# Patient Record
Sex: Male | Born: 1965 | Race: White | Hispanic: No | Marital: Single | Smoking: Former smoker
Health system: Southern US, Community
[De-identification: ages and names within clinical notes are randomized; demographics above are authoritative.]

## PROBLEM LIST (undated history)

## (undated) DIAGNOSIS — F329 Major depressive disorder, single episode, unspecified: Secondary | ICD-10-CM

## (undated) DIAGNOSIS — F1910 Other psychoactive substance abuse, uncomplicated: Secondary | ICD-10-CM

## (undated) DIAGNOSIS — F419 Anxiety disorder, unspecified: Secondary | ICD-10-CM

## (undated) DIAGNOSIS — F32A Depression, unspecified: Secondary | ICD-10-CM

## (undated) DIAGNOSIS — E079 Disorder of thyroid, unspecified: Secondary | ICD-10-CM

## (undated) DIAGNOSIS — T1491XA Suicide attempt, initial encounter: Secondary | ICD-10-CM

## (undated) HISTORY — PX: OTHER SURGICAL HISTORY: SHX169

## (undated) HISTORY — DX: Disorder of thyroid, unspecified: E07.9

## (undated) HISTORY — DX: Major depressive disorder, single episode, unspecified: F32.9

## (undated) HISTORY — DX: Depression, unspecified: F32.A

## (undated) HISTORY — DX: Anxiety disorder, unspecified: F41.9

## (undated) HISTORY — DX: Other psychoactive substance abuse, uncomplicated: F19.10

---

## 1997-12-15 ENCOUNTER — Emergency Department (HOSPITAL_COMMUNITY): Admission: EM | Admit: 1997-12-15 | Discharge: 1997-12-15 | Payer: Self-pay | Admitting: Emergency Medicine

## 2000-08-29 ENCOUNTER — Other Ambulatory Visit (HOSPITAL_COMMUNITY): Admission: RE | Admit: 2000-08-29 | Discharge: 2000-09-29 | Payer: Self-pay | Admitting: Psychiatry

## 2000-09-05 ENCOUNTER — Encounter: Payer: Self-pay | Admitting: Emergency Medicine

## 2000-09-05 ENCOUNTER — Emergency Department (HOSPITAL_COMMUNITY): Admission: EM | Admit: 2000-09-05 | Discharge: 2000-09-05 | Payer: Self-pay | Admitting: Emergency Medicine

## 2002-02-11 ENCOUNTER — Encounter: Payer: Self-pay | Admitting: Emergency Medicine

## 2002-02-11 ENCOUNTER — Emergency Department (HOSPITAL_COMMUNITY): Admission: EM | Admit: 2002-02-11 | Discharge: 2002-02-11 | Payer: Self-pay | Admitting: Emergency Medicine

## 2002-05-30 ENCOUNTER — Emergency Department (HOSPITAL_COMMUNITY): Admission: EM | Admit: 2002-05-30 | Discharge: 2002-05-30 | Payer: Self-pay | Admitting: *Deleted

## 2006-05-05 ENCOUNTER — Emergency Department (HOSPITAL_COMMUNITY): Admission: EM | Admit: 2006-05-05 | Discharge: 2006-05-05 | Payer: Self-pay | Admitting: Emergency Medicine

## 2006-07-07 ENCOUNTER — Inpatient Hospital Stay (HOSPITAL_COMMUNITY): Admission: EM | Admit: 2006-07-07 | Discharge: 2006-07-11 | Payer: Self-pay | Admitting: Emergency Medicine

## 2006-08-29 ENCOUNTER — Emergency Department (HOSPITAL_COMMUNITY): Admission: EM | Admit: 2006-08-29 | Discharge: 2006-08-30 | Payer: Self-pay | Admitting: Emergency Medicine

## 2012-03-06 ENCOUNTER — Other Ambulatory Visit: Payer: Self-pay | Admitting: Physician Assistant

## 2012-03-06 ENCOUNTER — Ambulatory Visit
Admission: RE | Admit: 2012-03-06 | Discharge: 2012-03-06 | Disposition: A | Discharge status: Home / Self Care | Payer: 59 | Source: Ambulatory Visit | Attending: Physician Assistant | Admitting: Physician Assistant

## 2012-03-06 DIAGNOSIS — M545 Low back pain, unspecified: Secondary | ICD-10-CM

## 2014-01-10 ENCOUNTER — Ambulatory Visit: Payer: 59 | Admitting: Family Medicine

## 2014-01-10 VITALS — BP 100/72 | HR 66 | Temp 97.8°F | Resp 18 | Ht 76.0 in | Wt 224.0 lb

## 2014-01-10 DIAGNOSIS — H6120 Impacted cerumen, unspecified ear: Secondary | ICD-10-CM

## 2014-01-10 DIAGNOSIS — H9191 Unspecified hearing loss, right ear: Secondary | ICD-10-CM

## 2014-01-10 DIAGNOSIS — H9209 Otalgia, unspecified ear: Secondary | ICD-10-CM

## 2014-01-10 DIAGNOSIS — H9190 Unspecified hearing loss, unspecified ear: Secondary | ICD-10-CM

## 2014-01-10 MED ORDER — AMOXICILLIN 500 MG PO CAPS: 500.0000 mg | ORAL_CAPSULE | Freq: Three times a day (TID) | ORAL | Status: DC

## 2014-01-10 NOTE — Progress Notes (Signed)
Chief Complaint:  Chief Complaint  Patient presents with  . ears clogged    x1 mth has been using the drops otc     HPI: Christopher Giles is a 48 y.o. male who is here for  Acute on chronic Clogged ears and was recommended to get his ears cleaned.  He got a a smart phone and has been using earbuds for pandora and using it more so this may have quicken the onset of wax accumulation.  He had it done 1 year ago @ [facility].   Past Medical History  Diagnosis Date  . Anxiety   . Depression   . Substance abuse   . Thyroid disease    Past Surgical History  Procedure Laterality Date  . Punctured lung     History   Social History  . Marital Status: Single    Spouse Name: N/A    Number of Children: N/A  . Years of Education: N/A   Social History Main Topics  . Smoking status: Current Every Day Smoker -- 1.00 packs/day    Types: Cigarettes  . Smokeless tobacco: None  . Alcohol Use: No  . Drug Use: No  . Sexual Activity: None   Other Topics Concern  . None   Social History Narrative  . None   Family History  Problem Relation Age of Onset  . Hyperlipidemia Mother   . Cancer Mother   . Cancer Father   . Diabetes Maternal Grandmother    No Known Allergies Prior to Admission medications   Medication Sig Start Date End Date Taking? Authorizing Provider  clonazePAM (KLONOPIN) 1 MG tablet Take 1 mg by mouth 3 (three) times daily as needed for anxiety.   Yes Historical Provider, MD  levothyroxine (SYNTHROID, LEVOTHROID) 50 MCG tablet Take 50 mcg by mouth daily before breakfast.   Yes Historical Provider, MD  rosuvastatin (CRESTOR) 10 MG tablet Take 10 mg by mouth daily.   Yes Historical Provider, MD  sertraline (ZOLOFT) 100 MG tablet Take 100 mg by mouth daily. Takes one and half tablets a day   Yes Historical Provider, MD  silodosin (RAPAFLO) 8 MG CAPS capsule Take 8 mg by mouth daily with breakfast.   Yes Historical Provider, MD     ROS: The patient  denies fevers, chills, night sweats, unintentional weight loss, chest pain, palpitations, wheezing, dyspnea on exertion, nausea, vomiting, abdominal pain, dysuria, hematuria, melena, numbness, weakness, or tingling. + hearing loss  All other systems have been reviewed and were otherwise negative with the exception of those mentioned in the HPI and as above.    PHYSICAL EXAM: Filed Vitals:   01/10/14 2001  BP: 100/72  Pulse: 66  Temp: 97.8 F (36.6 C)  Resp: 18   Filed Vitals:   01/10/14 2001  Height: 6\' 4"  (1.93 m)  Weight: 224 lb (101.606 kg)   Body mass index is 27.28 kg/(m^2).  General: Alert, no acute distress HEENT:  Normocephalic, atraumatic, oropharynx patent. EOMI, PERRLA. Right ear  Cerumen impaction. Rechecked + erythematous external canal, I really can't see a TM, if so it is very retracted. Whisper test was normal after disimpaction Cardiovascular:  Regular rate and rhythm, no rubs murmurs or gallops.  No Carotid bruits, radial pulse intact. No pedal edema.  Respiratory: Clear to auscultation bilaterally.  No wheezes, rales, or rhonchi.  No cyanosis, no use of accessory musculature GI: No organomegaly, abdomen is soft and non-tender, positive bowel sounds.  No  masses. Skin: No rashes. Neurologic: Facial musculature symmetric. Psychiatric: Patient is appropriate throughout our interaction. Lymphatic: No cervical lymphadenopathy Musculoskeletal: Gait intact.   LABS: No results found for this or any previous visit.   EKG/XRAY:   Primary [facility]    ASSESSMENT/PLAN: Encounter Diagnoses  Name Primary?  . Cerumen impaction Yes  . Decreased hearing of right ear   . Otalgia    Right cerumen impaction sucessful He was also given a r for amoxacillin since he may have an infection after flusihing, ear canal is erythematous and TM may have been displaced/retracted.  I am unable to visualize it after flushing.  Whisper test was normal after  disimpaction F/u prn  Gross sideeffects, risk and benefits, and alternatives of medications d/w patient. Patient is aware that all medications have potential sideeffects and we are unable to predict every sideeffect or drug-drug interaction that may occur.  [name], DO 01/10/2014 8:51 PM

## 2016-05-31 ENCOUNTER — Emergency Department (HOSPITAL_COMMUNITY)
Admission: EM | Admit: 2016-05-31 | Discharge: 2016-06-01 | Disposition: A | Discharge status: Home / Self Care | Payer: Self-pay | Attending: Emergency Medicine | Admitting: Emergency Medicine

## 2016-05-31 ENCOUNTER — Emergency Department (HOSPITAL_COMMUNITY): Payer: Self-pay

## 2016-05-31 ENCOUNTER — Encounter (HOSPITAL_COMMUNITY): Payer: Self-pay | Admitting: *Deleted

## 2016-05-31 DIAGNOSIS — F418 Other specified anxiety disorders: Secondary | ICD-10-CM | POA: Insufficient documentation

## 2016-05-31 DIAGNOSIS — F419 Anxiety disorder, unspecified: Secondary | ICD-10-CM

## 2016-05-31 DIAGNOSIS — E039 Hypothyroidism, unspecified: Secondary | ICD-10-CM | POA: Insufficient documentation

## 2016-05-31 DIAGNOSIS — F17210 Nicotine dependence, cigarettes, uncomplicated: Secondary | ICD-10-CM | POA: Insufficient documentation

## 2016-05-31 DIAGNOSIS — R109 Unspecified abdominal pain: Secondary | ICD-10-CM

## 2016-05-31 DIAGNOSIS — R1084 Generalized abdominal pain: Secondary | ICD-10-CM | POA: Insufficient documentation

## 2016-05-31 DIAGNOSIS — F329 Major depressive disorder, single episode, unspecified: Secondary | ICD-10-CM

## 2016-05-31 LAB — CBC
HCT: 45.6 % (ref 39.0–52.0)
Hemoglobin: 15.8 g/dL (ref 13.0–17.0)
MCH: 31.6 pg (ref 26.0–34.0)
MCHC: 34.6 g/dL (ref 30.0–36.0)
MCV: 91.2 fL (ref 78.0–100.0)
PLATELETS: 236 10*3/uL (ref 150–400)
RBC: 5 MIL/uL (ref 4.22–5.81)
RDW: 12.4 % (ref 11.5–15.5)
WBC: 9.6 10*3/uL (ref 4.0–10.5)

## 2016-05-31 LAB — COMPREHENSIVE METABOLIC PANEL
ALBUMIN: 4.3 g/dL (ref 3.5–5.0)
ALT: 14 U/L — ABNORMAL LOW (ref 17–63)
AST: 18 U/L (ref 15–41)
Alkaline Phosphatase: 68 U/L (ref 38–126)
Anion gap: 8 (ref 5–15)
BILIRUBIN TOTAL: 0.5 mg/dL (ref 0.3–1.2)
CO2: 23 mmol/L (ref 22–32)
Calcium: 9.4 mg/dL (ref 8.9–10.3)
Chloride: 105 mmol/L (ref 101–111)
Creatinine, Ser: 0.83 mg/dL (ref 0.61–1.24)
GFR calc Af Amer: >60 mL/min (ref >60)
GFR calc non Af Amer: >60 mL/min (ref >60)
GLUCOSE: 87 mg/dL (ref 65–99)
POTASSIUM: 3.9 mmol/L (ref 3.5–5.1)
SODIUM: 136 mmol/L (ref 135–145)
Total Protein: 6.9 g/dL (ref 6.5–8.1)

## 2016-05-31 LAB — URINALYSIS, ROUTINE W REFLEX MICROSCOPIC
Bilirubin Urine: NEGATIVE
Glucose, UA: NEGATIVE mg/dL
Hgb urine dipstick: NEGATIVE
Ketones, ur: NEGATIVE mg/dL
Leukocytes, UA: NEGATIVE
NITRITE: NEGATIVE
PH: 6 (ref 5.0–8.0)
Protein, ur: NEGATIVE mg/dL
SPECIFIC GRAVITY, URINE: 1.006 (ref 1.005–1.030)

## 2016-05-31 LAB — TYPE AND SCREEN
ABO/RH(D): A POS
Antibody Screen: NEGATIVE

## 2016-05-31 LAB — TROPONIN I: Troponin I: <0.03 ng/mL (ref <0.03)

## 2016-05-31 LAB — TSH: TSH: 2.275 u[IU]/mL (ref 0.350–4.500)

## 2016-05-31 LAB — ABO/RH: ABO/RH(D): A POS

## 2016-05-31 LAB — POC OCCULT BLOOD, ED: Fecal Occult Bld: NEGATIVE

## 2016-05-31 MED ORDER — LEVOTHYROXINE SODIUM 50 MCG PO TABS: 50.0000 ug | ORAL_TABLET | Freq: Every day | ORAL | 1 refills | Status: DC

## 2016-05-31 MED ORDER — IOPAMIDOL (ISOVUE-300) INJECTION 61%
INTRAVENOUS | Status: AC
  Administered 2016-05-31: 100 mL
  Filled 2016-05-31: qty 100

## 2016-05-31 NOTE — ED Provider Notes (Signed)
The patient is a 50 year old male, he has a known history of depression and anxiety as well as hypothyroidism. He reports that over the last several months he has had a gradual decline with poor sleep habits, was prescribed Seroquel by his family doctor, he did not tolerate it stating that it made him feel like he was a zombie so he stopped taking it. He does not sleep at night, he takes occasional sleep during the day, he has had a progressive abdominal discomfort over the last month which is in the lower abdomen along with a weight loss which she describes to be 30 pounds. He also describes intermittent chest pain which occurs mostly at night, he does not have any shortness of breath fevers chills and has not had any vomiting. He reports that he is hardly eating anything yet he tells me that yesterday he was eating hot dogs, potato chips, oatmeal. He denies rashes of the skin, denies blood in the stools but has had some dark stools. On exam the patient has a soft abdomen, he has minimal tenderness the lower abdomen, he has normal heart and lung sounds without any wheezing rhonchi or rales, there is no tachycardia, strong pulses, no edema, no JVD, clear oropharynx. Labs thus far are totally unremarkable. The patient was without his levothyroxine for approximately 4 months prior to one month ago when he got one month filled. At this time the patient is out of his medication again. He will need to have a thyroid stimulating hormone test ordered as well as a CT of his abdomen and pelvis to rule out an intra-abdominal mass which could be causing his weight loss as well. This could also just be something simple such as depression and anxiety in a gentleman that has somewhat of a hectic lifestyle recently. He is unemployed, he is here with his sister and his brother-in-law for his support system. He is not suicidal.  Medical screening examination/treatment/procedure(s) were conducted as a shared visit with  non-physician practitioner(s) and myself.  I personally evaluated the patient during the encounter.  Clinical Impression:   Final diagnoses:  Abdominal pain, unspecified abdominal location  Anxiety and depression         Eber HongBrian Tacuma Graffam, MD 06/01/16 [phone]

## 2016-05-31 NOTE — Discharge Instructions (Signed)
Substance Abuse Treatment Programs ° °Intensive Outpatient Programs °[facility]     °[address]      °[place], [place]                   °[phone]      ° °[facility] °[address] °[place], [place] °[phone] ° °[facility]     °(Inpatient and outpatient)     °[address]           °[phone]   ° °[facility] °[phone] (Suboxone and Methadone) ° °[address]      °[place], [place] [address]      °[phone]      ° °[address] °[place], [place] °852-3033 ° °[facility] (Outpatient/Inpatient, Chemical)    °(insurance only) [phone]      °       °[facility] (Groups & Residential) °[place], [place] °[phone] ° °   °[facility]     °[address]     °[place], [place]      °[phone]      ° °[facility] (for caregivers and family) °[address] °[place], [place] °336-299-4655 ° ° ° ° ° °Residential Treatment Programs °[facility]      °[address], [place], [place] [address]  °([phone]      ° °[facility] °[address] [place], [place] [address] °[phone] ° °[facility]        °336-248-8914      ° °[facility] °1-800-659-3381 ° °[facility]             °[address]                                         °[place], [place]                                                °877-615-2722 or 336-784-9470                              ° °[facility] [place] °[address] °[place], [address] °1.877.941.8954 ° °[facility]    °[address]      °, [place]     °[phone]      ° °[facility] °[address] °, [place] °336-285-9073 ° °[facility]   °[address]     °[place], [place] [address]     °[phone]      °Admissions: 8am-3pm M-F ° °[facility] °[address] °[place] °336-227-7417 ° °[facility]: Residential Program (90 Days)   °[place], [place]      °336-725-8389 or 800-758-6077    ° °[facility] °[place], [place] °(Walk in Hours over the weekend or by referral) ° °[facility] °[address], [place], [place] [address] °([phone] ° °Crisis Mobile: [facility]:  1-877-626-1772 (for crisis response 24 hours a day) °[facility] Hotline:      1-800-256-2452 °Outpatient Psychiatry and Counseling ° °[facility]: Mobile Crisis   Management 24 hours:  1-877-626-1772 ° °[facility] sliding scale fee and walk in schedule: M-F 8am-12pm/1pm-3pm °[address]  °[place], [place] [address] °[phone] ° °[facility] °[address] °[place], [place] [address] °336-703-9650 ° °[facility] ([facility] known [facility])- new patient walk-in appointments available Monday - Friday 8am -3pm.          °[address] °[place], [place] [address] °336-676-6840 or crisis line- 336-676-6905 ° °[facility]/ Intensive Outpatient Therapy Program °[address] °[place], [place] [address] °336-832-9804 ° °[facility]                  °Crisis Services      °[phone]      °[address]     °[place], [place] [address]                ° °[facility]   °[facility] °[phone] °[address] °[place], [place] [address] ° ° °[facility]          °[address]  °[place], [place] [address]       °([phone] ° °[facility] °[address] °[place], [place] [address] °336-292-1510 ° °[facility]    °[address]    °[place], [place] [address]     °[phone]      ° °Parish McKinney, MD     °[address]     °[place] [address]     °336-282-1251     °  °[facility] °[address] °[place] [address] ° °[facility]     °[address]     °[place], [place]      °336-542-2076      ° °[facility] °Shamsher Ahluwalia, MD °[address] °[place], [place] [address] °336-420-9558 ° °[facility]     °[address]     °[place], [place] [address]     °336-274-1237      ° °[facility] °[address] °[place], [place] [address] °336-854-4450 °Resources for Temporary Residential Assistance/Crisis Centers ° °DAY CENTERS °[facility] °M-F 8am-3pm   °[address] [place], [place] [address]   336-332-0824 °Services include: laundry, barbering, support groups, case management, phone  & computer access, showers, AA/NA mtgs, mental health/substance abuse nurse, job skills class, disability information, VA assistance, spiritual classes, etc.  ° °HOMELESS SHELTERS ° °[facility]     °[facility]   °[address], [place]     °[phone]       °       °[facility] (women and children)       °[address] °, [place] [address] °336-275-0820 °[email or web address] for application and process °Application Required ° °[facility]   °[address]    °[place] [address]     °[phone]       °             °[facility] °[address] °, [place] [address] °336.273.5572 °336-235-0363(schedule application appt.) °Application Required ° °[facility] (women only)    °[address]     °[place], [place] [address]     °336-884-1039      °  Intake starts 6pm daily °Need valid ID, SSC, & Police report °[facility] [place] °[address] °[place], [place] °336-881-5420 °Application Required ° °[facility] (men only)     °[address]      °[place], [place]     °[phone]      ° °[facility] °(Pregnant women only) °[address] °[place], [place] °336-275-0206 ° °[facility]      °[address]      °[place], [place] [address]     °[phone]      °       °[facility] °[address] °[place], [place] °336-723-1848 °90 day commitment/SA/Application process ° °[facility](men only)     °[address]     °[place], [place]     °[phone]       °Check-in at 7pm     °       °[facility] °[address] °[place], [place] [address] °336-248-6684 °Men/Women/Women and Children must be there by 7 pm ° °[facility] °[place], [place] °336-722-8721                ° °

## 2016-05-31 NOTE — ED Triage Notes (Signed)
Pt states weight loss and fatigue for several months and chest pain and diffuse abdominal pressure and dark stools x 1 week.  Family states they saw pt 3 weeks ago and he looks like he's lost 30 lbs.

## 2016-05-31 NOTE — ED Notes (Signed)
MD at bedside. 

## 2016-05-31 NOTE — ED Provider Notes (Signed)
[facility]-EMERGENCY DEPT Provider Note   CSN: [id number] Arrival date & time: 05/31/16  1134     History   Chief Complaint Chief Complaint  Patient presents with  . Diarrhea  . Rectal Bleeding  . Chest Pain    HPI Christopher Giles is a 50 y.o. male.  Patient is a 50 yo M with history of anxiety, depression, and hypothyroidism (noncompliant with Synthroid over past 4 months due to inability to pay for prescriptions), presenting with fatigue, poor sleep, generalized abdominal pain, loose dark stools over the past week, and what he states is 35 lb weight loss over the past 3-4 weeks. Denies any hx of peptic ulcers or GI disease, recent alcohol abuse (prior alcoholic, sober since 2005), or family hx of gastric cancer. Occasional chest pains at night, but no fever, chills, rashes, changes to hair, skin, or nails, shortness of breath, hematemesis, or dysuria. Brother-in-law is present with patient, and described tumultuous social situation and failing health since losing his job.    Diarrhea   Associated symptoms include abdominal pain. Pertinent negatives include no vomiting, no chills, no arthralgias and no cough.  Rectal Bleeding  Associated symptoms: abdominal pain   Associated symptoms: no fever and no vomiting   Chest Pain   Associated symptoms include abdominal pain and weakness. Pertinent negatives include no back pain, no cough, no fever, no nausea, no shortness of breath and no vomiting.    Past Medical History:  Diagnosis Date  . Anxiety   . Depression   . Substance abuse   . Thyroid disease    hypo    There are no active problems to display for this patient.   Past Surgical History:  Procedure Laterality Date  . punctured lung         Home Medications    Prior to Admission medications   Medication Sig Start Date End Date Taking? Authorizing Provider  bismuth subsalicylate (PEPTO BISMOL) 262 MG chewable tablet Chew 524 mg by mouth as needed for indigestion or  diarrhea or loose stools.   Yes Historical Provider, MD  clonazePAM (KLONOPIN) 1 MG tablet Take 1 mg by mouth 3 (three) times daily as needed for anxiety.   Yes Historical Provider, MD  DULoxetine (CYMBALTA) 60 MG capsule Take 60 mg by mouth daily.   Yes Historical Provider, MD  ibuprofen (ADVIL,MOTRIN) 200 MG tablet Take 400 mg by mouth every 6 (six) hours as needed for moderate pain.   Yes Historical Provider, MD  levothyroxine (SYNTHROID, LEVOTHROID) 50 MCG tablet Take 50 mcg by mouth daily before breakfast.   Yes Historical Provider, MD  Menthol, Topical Analgesic, (BIOFREEZE ROLL-ON COLORLESS) 4 % GEL Apply 1 application topically daily as needed (pain).   Yes Historical Provider, MD  Menthol-Methyl Salicylate (ICY HOT BALM EXTRA STRENGTH) 7.6-29 % OINT Apply 1 application topically daily as needed (pain).   Yes Historical Provider, MD  naproxen sodium (ANAPROX) 220 MG tablet Take 220 mg by mouth 2 (two) times daily as needed (pain).   Yes Historical Provider, MD  amoxicillin (AMOXIL) 500 MG capsule Take 1 capsule (500 mg total) by mouth 3 (three) times daily. 01/10/14   Thao P Le, DO  rosuvastatin (CRESTOR) 10 MG tablet Take 10 mg by mouth daily.    Historical Provider, MD  sertraline (ZOLOFT) 100 MG tablet Take 100 mg by mouth daily. Takes one and half tablets a day    Historical Provider, MD  silodosin (RAPAFLO) 8 MG CAPS capsule Take 8 mg  by mouth daily with breakfast.    Historical Provider, MD    Family History Family History  Problem Relation Age of Onset  . Hyperlipidemia Mother   . Cancer Mother   . Cancer Father   . Diabetes Maternal Grandmother     Social History Social History  Substance Use Topics  . Smoking status: Current Every Day Smoker    Packs/day: 1.00    Types: Cigarettes  . Smokeless tobacco: Never Used  . Alcohol use No     Allergies   Review of patient's allergies indicates no known allergies.   Review of Systems Review of Systems  Constitutional:  Positive for fatigue and unexpected weight change. Negative for appetite change, chills and fever.  Respiratory: Negative for cough and shortness of breath.   Cardiovascular: Positive for chest pain.  Gastrointestinal: Positive for abdominal pain, diarrhea and hematochezia. Negative for nausea and vomiting.  Genitourinary: Negative for dysuria.  Musculoskeletal: Negative for arthralgias, back pain and gait problem.  Skin: Negative for rash.  Neurological: Positive for weakness. Negative for syncope.  Psychiatric/Behavioral: Negative for suicidal ideas.  All other systems reviewed and are negative.    Physical Exam Updated Vital Signs BP 113/90   Pulse 66   Temp 97.6 F (36.4 C) (Oral)   Resp 16   Ht 6\' 4"  (1.93 m)   Wt 89.8 kg   SpO2 96%   BMI 24.10 kg/m   Physical Exam  Constitutional: He is oriented to person, place, and time.  Disheveled appearing and anxious, but no acute distress  HENT:  Head: Normocephalic and atraumatic.  Mouth/Throat: Oropharynx is clear and moist.  Eyes: EOM are normal. Pupils are equal, round, and reactive to light.  Cardiovascular: Normal rate, regular rhythm, normal heart sounds and intact distal pulses.   Pulmonary/Chest: Effort normal and breath sounds normal. No respiratory distress.  Abdominal: Soft. Bowel sounds are normal. He exhibits no distension and no mass. There is no tenderness. There is no guarding.  Genitourinary: Rectum normal. Rectal exam shows guaiac negative stool.  Musculoskeletal: Normal range of motion.  Neurological: He is alert and oriented to person, place, and time. He has normal reflexes.  Skin: Skin is warm and dry. No rash noted.  Psychiatric: He has a normal mood and affect.  Nursing note and vitals reviewed.    ED Treatments / Results  Labs (all labs ordered are listed, but only abnormal results are displayed) Labs Reviewed  COMPREHENSIVE METABOLIC PANEL - Abnormal; Notable for the following:       Result  Value   BUN <5 (*)    ALT 14 (*)    All other components within normal limits  CBC  TROPONIN I  URINALYSIS, ROUTINE W REFLEX MICROSCOPIC (NOT AT [facility])  TSH  POC OCCULT BLOOD, ED  TYPE AND SCREEN  ABO/RH    EKG  EKG Interpretation  Date/Time:  Monday May 31 2016 12:26:06 EDT Ventricular Rate:  78 PR Interval:  118 QRS Duration: 86 QT Interval:  356 QTC Calculation: 405 R Axis:   65 Text Interpretation:  Normal sinus rhythm Septal infarct , age undetermined Abnormal ECG Confirmed by MILLER  MD, BRIAN ([id number]) on 05/31/2016 7:55:33 PM       Radiology No results found.  Procedures Procedures (including critical care time)  Medications Ordered in ED Medications - No data to display   Initial Impression / Assessment and Plan / ED Course  I have reviewed the triage vital signs and the nursing notes.  Pertinent labs & imaging results that were available during my care of the patient were reviewed by me and considered in my medical decision making (see chart for details).  Clinical Course   Initial labs reviewed and normal. Hgb stable at 15.8. EKG normal sinus and negative troponin. Chest pain may be related to anxiety. Negative hemoccult. CT abdomen shows no acute process, and TSH 2.2 within normal limits.   Reassured patient that all labs and imaging are normal, and discussed with patient and family that his physical symptoms are likely attributed to his hx of anxiety/depression and increasing financial stressors. When asked again about any suicidal ideations, patient adamantly denied any thoughts of harming himself. Social work consulted and information on psychiatric counseling provided in d/c instructions. Patient agreeable to d/c home and f/u with PCP.  Final Clinical Impressions(s) / ED Diagnoses   Final diagnoses:  Abdominal pain, unspecified abdominal location  Anxiety and depression    New Prescriptions New Prescriptions   No medications on file       Jari Pigg II, PA 05/31/16 2354    Eber Hong, MD 06/01/16 [phone]

## 2016-06-12 ENCOUNTER — Encounter (HOSPITAL_COMMUNITY): Payer: Self-pay | Admitting: Emergency Medicine

## 2016-06-12 ENCOUNTER — Emergency Department (HOSPITAL_COMMUNITY)
Admission: EM | Admit: 2016-06-12 | Discharge: 2016-06-13 | Disposition: A | Discharge status: Other Acute Inpatient Hospital | Payer: Self-pay | Attending: Emergency Medicine | Admitting: Emergency Medicine

## 2016-06-12 DIAGNOSIS — F329 Major depressive disorder, single episode, unspecified: Secondary | ICD-10-CM

## 2016-06-12 DIAGNOSIS — F17210 Nicotine dependence, cigarettes, uncomplicated: Secondary | ICD-10-CM | POA: Insufficient documentation

## 2016-06-12 DIAGNOSIS — Z79899 Other long term (current) drug therapy: Secondary | ICD-10-CM | POA: Insufficient documentation

## 2016-06-12 DIAGNOSIS — F32A Depression, unspecified: Secondary | ICD-10-CM

## 2016-06-12 DIAGNOSIS — F419 Anxiety disorder, unspecified: Secondary | ICD-10-CM

## 2016-06-12 DIAGNOSIS — F418 Other specified anxiety disorders: Secondary | ICD-10-CM | POA: Insufficient documentation

## 2016-06-12 HISTORY — DX: Suicide attempt, initial encounter: T14.91XA

## 2016-06-12 LAB — COMPREHENSIVE METABOLIC PANEL
ALT: 12 U/L — ABNORMAL LOW (ref 17–63)
ANION GAP: 11 (ref 5–15)
AST: 17 U/L (ref 15–41)
Albumin: 4.4 g/dL (ref 3.5–5.0)
Alkaline Phosphatase: 55 U/L (ref 38–126)
BUN: 9 mg/dL (ref 6–20)
CHLORIDE: 100 mmol/L — AB (ref 101–111)
CO2: 25 mmol/L (ref 22–32)
Calcium: 9.8 mg/dL (ref 8.9–10.3)
Creatinine, Ser: 0.86 mg/dL (ref 0.61–1.24)
GFR calc Af Amer: >60 mL/min (ref >60)
GFR calc non Af Amer: >60 mL/min (ref >60)
GLUCOSE: 120 mg/dL — AB (ref 65–99)
POTASSIUM: 3.5 mmol/L (ref 3.5–5.1)
Sodium: 136 mmol/L (ref 135–145)
Total Bilirubin: 0.2 mg/dL — ABNORMAL LOW (ref 0.3–1.2)
Total Protein: 6.9 g/dL (ref 6.5–8.1)

## 2016-06-12 LAB — RAPID URINE DRUG SCREEN, HOSP PERFORMED
Amphetamines: NOT DETECTED
Barbiturates: NOT DETECTED
Benzodiazepines: POSITIVE — AB
COCAINE: NOT DETECTED
OPIATES: NOT DETECTED
Tetrahydrocannabinol: NOT DETECTED

## 2016-06-12 LAB — CBC
HCT: 44.4 % (ref 39.0–52.0)
Hemoglobin: 15.4 g/dL (ref 13.0–17.0)
MCH: 32 pg (ref 26.0–34.0)
MCHC: 34.7 g/dL (ref 30.0–36.0)
MCV: 92.1 fL (ref 78.0–100.0)
PLATELETS: 265 10*3/uL (ref 150–400)
RBC: 4.82 MIL/uL (ref 4.22–5.81)
RDW: 12.2 % (ref 11.5–15.5)
WBC: 7.4 10*3/uL (ref 4.0–10.5)

## 2016-06-12 LAB — ETHANOL: Alcohol, Ethyl (B): <5 mg/dL (ref <5)

## 2016-06-12 LAB — SALICYLATE LEVEL

## 2016-06-12 LAB — ACETAMINOPHEN LEVEL

## 2016-06-12 LAB — TSH: TSH: 1.771 u[IU]/mL (ref 0.350–4.500)

## 2016-06-12 NOTE — ED Provider Notes (Signed)
[facility]-EMER[facility]Y DEPT Provider Note   CSN: [id number] Arrival date & time: 06/12/16  1752     History   Chief Complaint Chief Complaint  Patient presents with  . Psychiatric Evaluation    HPI Christopher Giles is a 50 y.o. male with history of anxiety, depression who presents with severe depression. Patient spoke minimally and presented to his complaints on a written out and has no plan. Pictures of the notes are below. Patient denies any suicidal ideation or plan, homicidal ideation, AVH. However, he states that if he is not helped here, he may want to go home and hurt himself. Patient is currently followed by his PCP and he states his medications are not working. He has tried to be seen at [facility] in the past without success. Patient denies any pain or medical complaint, including chest pain, shortness of breath, abdominal pain, nausea, vomiting, urinary symptoms.            HPI  Past Medical History:  Diagnosis Date  . Anxiety   . Depression   . Substance abuse   . Thyroid disease    hypo    There are no active problems to display for this patient.   Past Surgical History:  Procedure Laterality Date  . punctured lung         Home Medications    Prior to Admission medications   Medication Sig Start Date End Date Taking? Authorizing Provider  amoxicillin (AMOXIL) 500 MG capsule Take 1 capsule (500 mg total) by mouth 3 (three) times daily. Patient not taking: Reported on 05/31/2016 01/10/14   Thao P Le, DO  bismuth subsalicylate (PEPTO BISMOL) 262 MG chewable tablet Chew 524 mg by mouth as needed for indigestion or diarrhea or loose stools.    Historical Provider, MD  clonazePAM (KLONOPIN) 1 MG tablet Take 1 mg by mouth 3 (three) times daily as needed for anxiety.    Historical Provider, MD  DULoxetine (CYMBALTA) 60 MG capsule Take 60 mg by mouth daily.    Historical Provider, MD  ibuprofen (ADVIL,MOTRIN) 200 MG tablet Take 400 mg by mouth every 6 (six) hours as  needed for moderate pain.    Historical Provider, MD  levothyroxine (SYNTHROID, LEVOTHROID) 50 MCG tablet Take 1 tablet (50 mcg total) by mouth daily before breakfast. 05/31/16   Daryl F de Villier II, PA  Menthol, Topical Analgesic, (BIOFREEZE ROLL-ON COLORLESS) 4 % GEL Apply 1 application topically daily as needed (pain).    Historical Provider, MD  Menthol-Methyl Salicylate (ICY HOT BALM EXTRA STRENGTH) 7.6-29 % OINT Apply 1 application topically daily as needed (pain).    Historical Provider, MD  naproxen sodium (ANAPROX) 220 MG tablet Take 220 mg by mouth 2 (two) times daily as needed (pain).    Historical Provider, MD    Family History Family History  Problem Relation Age of Onset  . Hyperlipidemia Mother   . Cancer Mother   . Cancer Father   . Diabetes Maternal Grandmother     Social History Social History  Substance Use Topics  . Smoking status: Current Every Day Smoker    Packs/day: 1.00    Types: Cigarettes  . Smokeless tobacco: Never Used  . Alcohol use No     Allergies   Review of patient's allergies indicates no known allergies.   Review of Systems Review of Systems  Constitutional: Negative for chills and fever.  HENT: Negative for facial swelling and sore throat.   Respiratory: Negative for shortness of breath.  Cardiovascular: Negative for chest pain.  Gastrointestinal: Negative for abdominal pain, nausea and vomiting.  Genitourinary: Negative for dysuria.  Musculoskeletal: Negative for back pain.  Skin: Negative for rash and wound.  Neurological: Negative for headaches.  Psychiatric/Behavioral: Positive for sleep disturbance. Negative for suicidal ideas. The patient is nervous/anxious.      Physical Exam Updated Vital Signs BP 110/74 (BP Location: Left Arm)   Pulse 78   Temp 98.6 F (37 C) (Oral)   Resp 14   SpO2 97%   Physical Exam  Constitutional: He appears well-developed and well-nourished. No distress.  HENT:  Head: Normocephalic and  atraumatic.  Mouth/Throat: Oropharynx is clear and moist. No oropharyngeal exudate.  Eyes: Conjunctivae are normal. Pupils are equal, round, and reactive to light. Right eye exhibits no discharge. Left eye exhibits no discharge. No scleral icterus.  Neck: Normal range of motion. Neck supple. No thyromegaly present.  Cardiovascular: Normal rate, regular rhythm and normal heart sounds.  Exam reveals no gallop and no friction rub.   No murmur heard. Pulmonary/Chest: Effort normal and breath sounds normal. No stridor. No respiratory distress. He has no wheezes. He has no rales.  Abdominal: Soft. Bowel sounds are normal. He exhibits no distension. There is no tenderness. There is no rebound and no guarding.  Musculoskeletal: He exhibits no edema.  Lymphadenopathy:    He has no cervical adenopathy.  Neurological: He is alert. Coordination normal.  Skin: Skin is warm and dry. No rash noted. He is not diaphoretic. No pallor.  Psychiatric: His mood appears anxious. He is not actively hallucinating. He exhibits a depressed mood. He expresses no homicidal and no suicidal ideation. He expresses no suicidal plans and no homicidal plans.  Nursing note and vitals reviewed.    ED Treatments / Results  Labs (all labs ordered are listed, but only abnormal results are displayed) Labs Reviewed  COMPREHENSIVE METABOLIC PANEL - Abnormal; Notable for the following:       Result Value   Chloride 100 (*)    Glucose, Bld 120 (*)    ALT 12 (*)    Total Bilirubin 0.2 (*)    All other components within normal limits  ACETAMINOPHEN LEVEL - Abnormal; Notable for the following:    Acetaminophen (Tylenol), Serum <10 (*)    All other components within normal limits  URINE RAPID DRUG SCREEN, HOSP PERFORMED - Abnormal; Notable for the following:    Benzodiazepines POSITIVE (*)    All other components within normal limits  ETHANOL  SALICYLATE LEVEL  CBC  TSH    EKG  EKG Interpretation None        Radiology No results found.  Procedures Procedures (including critical care time)  Medications Ordered in ED Medications - No data to display   Initial Impression / Assessment and Plan / ED Course  I have reviewed the triage vital signs and the nursing notes.  Pertinent labs & imaging results that were available during my care of the patient were reviewed by me and considered in my medical decision making (see chart for details).  Clinical Course   CBC unremarkable. CMP shows chloride 100, glucose 120, ALT 12. Negative ethanol, salicylate, acetaminophen level. UDS shows positive benzodiazepines. Patient medically cleared. TTS pending. I discussed with patient that he may not have admission criteria considering no current SI or EVH, however he still requested to speak with a counselor or psychiatrist. I discussed that this may take a while, however he is agreeable and "just wants help." He  does report that he may want to hurt himself if he is sent home.  8:40p Update from Ala Dach, TTS counselor, that patient meets criteria for inpatient treatment, however [facility] does not have a bed for him. Ala Dach will call around to other local inpatient facilities for placement. I have updated patient of this.   Final Clinical Impressions(s) / ED Diagnoses   Final diagnoses:  Depression  Anxiety    New Prescriptions New Prescriptions   No medications on file     Emi Holes, Cordelia Poche 06/12/16 2100    Melene Plan, DO 06/12/16 2103

## 2016-06-12 NOTE — BH Assessment (Signed)
Fransico MichaelKim Brooks, [facility] confirmed adult unit is at capacity. Faxed clinical information to the following facilities for placement:  [facility]   [address] K[address] DriveFord Ellis Patsy BaltimoreWarrick Jr, [facility], [facility], [facility] Triage Specialist [phone]

## 2016-06-12 NOTE — ED Triage Notes (Signed)
Presents with a written note explaining his feelings of hopelessness, depression, social anxiety, and social stressors. Tearful in triage. States nerves "are shot." Several requests for help in all capital letters throughout the written note. Denies suicidal ideation or a plan today, but tearful and stated "please, please help me and don't send me home feeling like this."

## 2016-06-12 NOTE — BH Assessment (Addendum)
Tele Assessment Note   Christopher Giles is an 50 y.o. single male who presents unaccompanied to [facility] ED reporting severe symptoms of depression and anxiety. Pt reports he has a long history of major depressive disorder and is currently prescribed Cymbalta 60 mg daily and Klonopin 0.5 mg TID PRN by his primary care physician, Dr. Merri Brunette. Pt says his medication has been effective for years but over the past two months he feels they are not helping. Pt scales both his depression and anxiety as 10/10. He denies current suicidal ideation but says he recently has had suicidal thoughts and if he doesn't receive inpatient treatment he believes he will decompensate and become suicidal. Pt reports a history of at least six previous suicide attempts including ingesting rat poison, cutting his wrist and overdosing. Pt says he has a history of multiple psychiatric hospitalizations and ECT and his last inpatient treatment was at [facility] in 2008. He denies current homicidal ideation or history of violence. He denies any history of psychotic symptoms. 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  Pt identifies his primary stressor as his mental health symptoms. He states he lost his job two months ago and is in danger of losing his residence. He says he has no health insurance which has hindered his ability to access mental health treatment. He does not current have a psychiatrist or therapist. Pt reports he lives alone, has never been married and has no children. He identifies his sister as his only support.   The following note was written by the Pt describing his symptoms and concerns:          Pt is dressed in hospital scrubs, alert, oriented x4 with normal speech and normal motor behavior. Eye contact is good. Pt's mood is depressed and anxious; affect is congruent with mood. Thought process is coherent and relevant.  There is no indication Pt is currently responding to internal stimuli or experiencing delusional thought content. Pt was cooperative throughout assessment. He states if he is discharged he will be back in the ED because his symptoms will get worse.    Diagnosis: Major Depressive Disorder, Recurrent, Severe Without Psychotic Features Generalized Anxiety Disorder  Past Medical History:  Past Medical History:  Diagnosis Date  . Anxiety   . Depression   . Substance abuse   . Thyroid disease    hypo    Past Surgical History:  Procedure Laterality Date  . punctured lung      Family History:  Family History  Problem Relation Age of Onset  . Hyperlipidemia Mother   . Cancer Mother   . Cancer Father   . Diabetes Maternal Grandmother     Social History:  reports that he has been smoking Cigarettes.  He has been smoking about 1.00 pack per day. He has never used smokeless tobacco. He reports that he does not drink alcohol or use drugs.  Additional Social History:  Alcohol / Drug Use Pain Medications: Denies abuse Prescriptions: Denies abuse Over the Counter: Denies abuse History of alcohol / drug use?: Yes (Pt reports he has abused alcohol, cocaine and cannabis in the past. Sober 11 years.) Longest period of sobriety (when/how long): Currently sober 11 years  CIWA: CIWA-Ar BP: 110/74 Pulse Rate: 78 COWS:    PATIENT STRENGTHS: (choose at least two) Ability for insight Average or above average intelligence Capable of independent living Communication skills General fund  of knowledge Motivation for treatment/growth Physical Health  Allergies: No Known Allergies  Home Medications:  (Not in a hospital admission)  OB/GYN Status:  No LMP for male patient.  General Assessment Data [facility] Assessment: In system Is this a Tele or Face-to-Face Assessment?: Tele Assessment Is this an Initial Assessment or a Re-assessment for this encounter?: Initial  Assessment Marital status: Single Maiden name: NA Is patient pregnant?: No Pregnancy Status: No Living Arrangements: Alone Can pt return to current living arrangement?: Yes Admission Status: Voluntary Is patient capable of signing voluntary admission?: Yes Referral Source: Self/Family/Friend Insurance type: Self-pay     Crisis Care Plan Living Arrangements: Alone Legal Guardian: Other: (Self) Name of Psychiatrist: None Name of Therapist: None  Education Status Is patient currently in school?: No Current Grade: NA Highest grade of school patient has completed: Some college Name of school: NA Contact person: NA  Risk to self with the past 6 months Suicidal Ideation: No Has patient been a risk to self within the past 6 months prior to admission? : Yes Suicidal Intent: No Has patient had any suicidal intent within the past 6 months prior to admission? : No Is patient at risk for suicide?: Yes Suicidal Plan?: No Has patient had any suicidal plan within the past 6 months prior to admission? : No Access to Means: No What has been your use of drugs/alcohol within the last 12 months?: No recent use Previous Attempts/Gestures: Yes How many times?: 6 Other Self Harm Risks: Pt not caring for himself Triggers for Past Attempts: Other (Comment) ("Feeling hopeless") Intentional Self Injurious Behavior: None Family Suicide History: No Recent stressful life event(s): Job Loss, Financial Problems Persecutory voices/beliefs?: No Depression: Yes Depression Symptoms: Despondent, Tearfulness, Isolating, Fatigue, Guilt, Loss of interest in usual pleasures, Feeling worthless/self pity Substance abuse history and/or treatment for substance abuse?: Yes Suicide prevention information given to non-admitted patients: Not applicable  Risk to Others within the past 6 months Homicidal Ideation: No Does patient have any lifetime risk of violence toward others beyond the six months prior to  admission? : No Thoughts of Harm to Others: No Current Homicidal Intent: No Current Homicidal Plan: No Access to Homicidal Means: No Identified Victim: None History of harm to others?: No Assessment of Violence: None Noted Violent Behavior Description: Pt denies history of violence Does patient have access to weapons?: No Criminal Charges Pending?: No Does patient have a court date: No Is patient on probation?: No  Psychosis Hallucinations: None noted Delusions: None noted  Mental Status Report Appearance/Hygiene: In scrubs Eye Contact: Good Motor Activity: Unremarkable Speech: Logical/coherent Level of Consciousness: Alert Mood: Depressed, Helpless, Anxious Affect: Anxious, Depressed Anxiety Level: Severe Thought Processes: Coherent, Relevant Judgement: Unimpaired Orientation: Person, Place, Time, Situation, Appropriate for developmental age Obsessive Compulsive Thoughts/Behaviors: None  Cognitive Functioning Concentration: Fair Memory: Recent Intact, Remote Intact IQ: Average Insight: Good Impulse Control: Fair Appetite: Poor Weight Loss: 25 Weight Gain: 0 Sleep: Increased Total Hours of Sleep: 10 Vegetative Symptoms: Staying in bed, Decreased grooming, Not bathing  ADLScreening [facility]) Patient's cognitive ability adequate to safely complete daily activities?: Yes Patient able to express need for assistance with ADLs?: Yes Independently performs ADLs?: Yes (appropriate for developmental age)  Prior Inpatient Therapy Prior Inpatient Therapy: Yes Prior Therapy Dates: 2008, multiple admits Prior Therapy Facilty[facility]Provider(s): [facility], multiple facilities Reason for Treatment: Depression  Prior Outpatient Therapy Prior Outpatient Therapy: Yes Prior Therapy Dates: Multiple over the years Prior Therapy Facilty/Provider(s): Multiple  over the years Reason for Treatment: Depression, anxiety Does patient have an ACCT team?: No Does patient  have Intensive In-House Services?  : No Does patient have [facility] services? : No Does patient have P4CC services?: No  ADL Screening (condition at time of admission) Patient's cognitive ability adequate to safely complete daily activities?: Yes Is the patient deaf or have difficulty hearing?: No Does the patient have difficulty seeing, even when wearing glasses/contacts?: No Does the patient have difficulty concentrating, remembering, or making decisions?: No Patient able to express need for assistance with ADLs?: Yes Does the patient have difficulty dressing or bathing?: No Independently performs ADLs?: Yes (appropriate for developmental age) Does the patient have difficulty walking or climbing stairs?: No Weakness of Legs: None Weakness of Arms/Hands: None  Home Assistive Devices/Equipment Home Assistive Devices/Equipment: Eyeglasses    Abuse/Neglect Assessment (Assessment to be complete while patient is alone) Physical Abuse: Denies Verbal Abuse: Denies Sexual Abuse: Denies Exploitation of patient/patient's resources: Denies Self-Neglect: Denies     Merchant navy officer (For Healthcare) Does patient have an advance directive?: No Would patient like information on creating an advanced directive?: No - patient declined information    Additional Information 1:1 In Past 12 Months?: No CIRT Risk: No Elopement Risk: No Does patient have medical clearance?: Yes     Disposition: Fransico Michael, [facility] at [facility], confirmed adult unit is at capacity. Gave clinical report to Nira Conn, NP who said Pt meets criteria for inpatient psychiatric treatment. TTS will contact other facilities for placement. Notified Emi Holes, PA-C of recommendation.  Disposition Initial Assessment Completed for this Encounter: Yes Disposition of Patient: Inpatient treatment program Type of inpatient treatment program: Adult   Pamalee Leyden, [facility], [facility], [facility] Triage Specialist [phone]   Patsy Baltimore, Harlin Rain 06/12/2016 8:20 PM

## 2016-06-12 NOTE — ED Notes (Signed)
Delay in lab draw pt talking to tts

## 2016-06-12 NOTE — ED Notes (Signed)
TTS at bedside. 

## 2016-06-13 ENCOUNTER — Encounter (HOSPITAL_COMMUNITY): Payer: Self-pay | Admitting: *Deleted

## 2016-06-13 ENCOUNTER — Inpatient Hospital Stay (HOSPITAL_COMMUNITY)
Admission: AD | Admit: 2016-06-13 | Discharge: 2016-07-06 | DRG: 885 | Disposition: A | Discharge status: Home / Self Care | Payer: Federal, State, Local not specified - Other | Source: Intra-hospital | Attending: Psychiatry | Admitting: Psychiatry

## 2016-06-13 DIAGNOSIS — Z599 Problem related to housing and economic circumstances, unspecified: Secondary | ICD-10-CM | POA: Diagnosis not present

## 2016-06-13 DIAGNOSIS — F411 Generalized anxiety disorder: Secondary | ICD-10-CM | POA: Diagnosis present

## 2016-06-13 DIAGNOSIS — F331 Major depressive disorder, recurrent, moderate: Secondary | ICD-10-CM

## 2016-06-13 DIAGNOSIS — Z915 Personal history of self-harm: Secondary | ICD-10-CM

## 2016-06-13 DIAGNOSIS — F17200 Nicotine dependence, unspecified, uncomplicated: Secondary | ICD-10-CM | POA: Diagnosis present

## 2016-06-13 DIAGNOSIS — F410 Panic disorder [episodic paroxysmal anxiety] without agoraphobia: Secondary | ICD-10-CM | POA: Diagnosis present

## 2016-06-13 DIAGNOSIS — Z8489 Family history of other specified conditions: Secondary | ICD-10-CM | POA: Diagnosis not present

## 2016-06-13 DIAGNOSIS — F332 Major depressive disorder, recurrent severe without psychotic features: Principal | ICD-10-CM | POA: Diagnosis present

## 2016-06-13 DIAGNOSIS — Z791 Long term (current) use of non-steroidal anti-inflammatories (NSAID): Secondary | ICD-10-CM | POA: Diagnosis not present

## 2016-06-13 DIAGNOSIS — Z833 Family history of diabetes mellitus: Secondary | ICD-10-CM | POA: Diagnosis not present

## 2016-06-13 DIAGNOSIS — Z808 Family history of malignant neoplasm of other organs or systems: Secondary | ICD-10-CM | POA: Diagnosis not present

## 2016-06-13 DIAGNOSIS — Z809 Family history of malignant neoplasm, unspecified: Secondary | ICD-10-CM | POA: Diagnosis not present

## 2016-06-13 DIAGNOSIS — F32A Depression, unspecified: Secondary | ICD-10-CM

## 2016-06-13 DIAGNOSIS — F329 Major depressive disorder, single episode, unspecified: Secondary | ICD-10-CM

## 2016-06-13 DIAGNOSIS — G4700 Insomnia, unspecified: Secondary | ICD-10-CM | POA: Diagnosis present

## 2016-06-13 DIAGNOSIS — Z818 Family history of other mental and behavioral disorders: Secondary | ICD-10-CM

## 2016-06-13 DIAGNOSIS — E559 Vitamin D deficiency, unspecified: Secondary | ICD-10-CM | POA: Diagnosis present

## 2016-06-13 DIAGNOSIS — F4010 Social phobia, unspecified: Secondary | ICD-10-CM | POA: Diagnosis present

## 2016-06-13 DIAGNOSIS — F339 Major depressive disorder, recurrent, unspecified: Secondary | ICD-10-CM | POA: Diagnosis present

## 2016-06-13 DIAGNOSIS — Z8349 Family history of other endocrine, nutritional and metabolic diseases: Secondary | ICD-10-CM | POA: Diagnosis not present

## 2016-06-13 DIAGNOSIS — Z79899 Other long term (current) drug therapy: Secondary | ICD-10-CM | POA: Diagnosis not present

## 2016-06-13 DIAGNOSIS — R410 Disorientation, unspecified: Secondary | ICD-10-CM

## 2016-06-13 DIAGNOSIS — R42 Dizziness and giddiness: Secondary | ICD-10-CM

## 2016-06-13 DIAGNOSIS — E079 Disorder of thyroid, unspecified: Secondary | ICD-10-CM | POA: Diagnosis present

## 2016-06-13 DIAGNOSIS — Z8249 Family history of ischemic heart disease and other diseases of the circulatory system: Secondary | ICD-10-CM | POA: Diagnosis not present

## 2016-06-13 DIAGNOSIS — G8929 Other chronic pain: Secondary | ICD-10-CM | POA: Diagnosis present

## 2016-06-13 MED ORDER — LEVOTHYROXINE SODIUM 50 MCG PO TABS
50.0000 ug | ORAL_TABLET | Freq: Every day | ORAL | Status: DC
  Administered 2016-06-13: 50 ug via ORAL
  Filled 2016-06-13 (×2): qty 1

## 2016-06-13 MED ORDER — IBUPROFEN 400 MG PO TABS: 400.0000 mg | ORAL_TABLET | Freq: Four times a day (QID) | ORAL | Status: DC | PRN

## 2016-06-13 MED ORDER — NAPROXEN SODIUM 220 MG PO TABS: 220.0000 mg | ORAL_TABLET | Freq: Two times a day (BID) | ORAL | Status: DC | PRN

## 2016-06-13 MED ORDER — ACETAMINOPHEN 325 MG PO TABS
650.0000 mg | ORAL_TABLET | Freq: Four times a day (QID) | ORAL | Status: DC | PRN
  Administered 2016-06-25 – 2016-07-05 (×5): 650 mg via ORAL
  Filled 2016-06-13 (×5): qty 2

## 2016-06-13 MED ORDER — DULOXETINE HCL 60 MG PO CPEP
60.0000 mg | ORAL_CAPSULE | Freq: Every day | ORAL | Status: DC
  Administered 2016-06-13: 60 mg via ORAL
  Filled 2016-06-13: qty 1

## 2016-06-13 MED ORDER — CLONAZEPAM 1 MG PO TABS
1.0000 mg | ORAL_TABLET | Freq: Once | ORAL | Status: AC
  Administered 2016-06-13: 1 mg via ORAL
  Filled 2016-06-13: qty 1

## 2016-06-13 MED ORDER — TRAZODONE HCL 50 MG PO TABS
50.0000 mg | ORAL_TABLET | Freq: Every evening | ORAL | Status: DC | PRN
  Administered 2016-06-13 (×2): 50 mg via ORAL
  Filled 2016-06-13 (×7): qty 1

## 2016-06-13 MED ORDER — HYDROXYZINE HCL 25 MG PO TABS
25.0000 mg | ORAL_TABLET | Freq: Four times a day (QID) | ORAL | Status: DC | PRN
  Administered 2016-06-13 – 2016-06-30 (×13): 25 mg via ORAL
  Filled 2016-06-13 (×5): qty 1
  Filled 2016-06-13: qty 10
  Filled 2016-06-13 (×9): qty 1

## 2016-06-13 MED ORDER — CLONAZEPAM 0.5 MG PO TABS
1.0000 mg | ORAL_TABLET | Freq: Three times a day (TID) | ORAL | Status: DC | PRN
  Administered 2016-06-13: 1 mg via ORAL
  Filled 2016-06-13: qty 2

## 2016-06-13 MED ORDER — MAGNESIUM HYDROXIDE 400 MG/5ML PO SUSP
30.0000 mL | Freq: Every day | ORAL | Status: DC | PRN
  Administered 2016-06-16 – 2016-06-29 (×3): 30 mL via ORAL
  Filled 2016-06-13 (×3): qty 30

## 2016-06-13 MED ORDER — BISMUTH SUBSALICYLATE 262 MG PO CHEW
524.0000 mg | CHEWABLE_TABLET | ORAL | Status: DC | PRN
Start: 2016-06-13 — End: 2016-06-13

## 2016-06-13 MED ORDER — ALUM & MAG HYDROXIDE-SIMETH 200-200-20 MG/5ML PO SUSP
30.0000 mL | ORAL | Status: DC | PRN
  Administered 2016-06-20 – 2016-06-25 (×3): 30 mL via ORAL
  Filled 2016-06-13 (×3): qty 30

## 2016-06-13 NOTE — ED Notes (Signed)
Lunch tray ordered for patient.

## 2016-06-13 NOTE — ED Notes (Signed)
Pt ambulatory to C25 w/sitter to C25. Pt wearing burgundy paper scrubs - 1 labeled belongings bag placed at nurses' desk for inventory.

## 2016-06-13 NOTE — ED Notes (Signed)
Pt updated on POC, [facility], pt oriented about POD C rules and encouraged to change on paper scrubs so we can call security to wand him. Pt oriented that he will not have access to his cell phone while on POD c and if he needs to get some phone numbers from his cell phone to write them down so he can use the hospital phone since he will have 2 phone calls per day, pt verbalized understanding.

## 2016-06-13 NOTE — ED Notes (Signed)
Ordered breakfast tray at 0551- TY

## 2016-06-13 NOTE — ED Provider Notes (Signed)
Pt evaluated by TTS last night and inpatient treatment recommended.  The pt placed in a psych hold and home meds have been ordered.   Christopher LefevreJulie Damarien Nyman, MD 06/13/16 [phone]

## 2016-06-13 NOTE — ED Notes (Signed)
[facility] [place] at [facility].

## 2016-06-13 NOTE — ED Notes (Signed)
Pt on phone at nurses' desk. 

## 2016-06-13 NOTE — ED Notes (Signed)
Staffing Office aware pt moved to C20.

## 2016-06-13 NOTE — ED Notes (Signed)
Pt talking w/sitter.  

## 2016-06-13 NOTE — BH Assessment (Signed)
Brook Panorama VillageMcNichol, [facility], said room 404-2 is now available under the service of Dr. Carmon GinsbergF. Cobos. Notified Dr. Adela LankFloyd and Selena LesserSuzanna, [facility] at [facility] of [facility].   Harlin RainFord Ellis Patsy BaltimoreWarrick Jr, LPC, [facility], [facility] Triage Specialist [phone]

## 2016-06-13 NOTE — ED Notes (Signed)
Pt signed Medical Clearance pt policy form - voiced understanding.

## 2016-06-13 NOTE — ED Notes (Signed)
Pt sitting in bed talking w/sitter and watching tv.

## 2016-06-13 NOTE — ED Notes (Signed)
Pt wanded by security at the bedside.

## 2016-06-14 ENCOUNTER — Encounter (HOSPITAL_COMMUNITY): Payer: Self-pay | Admitting: *Deleted

## 2016-06-14 DIAGNOSIS — Z809 Family history of malignant neoplasm, unspecified: Secondary | ICD-10-CM

## 2016-06-14 DIAGNOSIS — R45851 Suicidal ideations: Secondary | ICD-10-CM

## 2016-06-14 DIAGNOSIS — Z79899 Other long term (current) drug therapy: Secondary | ICD-10-CM

## 2016-06-14 DIAGNOSIS — F332 Major depressive disorder, recurrent severe without psychotic features: Principal | ICD-10-CM

## 2016-06-14 DIAGNOSIS — Z8249 Family history of ischemic heart disease and other diseases of the circulatory system: Secondary | ICD-10-CM

## 2016-06-14 DIAGNOSIS — Z791 Long term (current) use of non-steroidal anti-inflammatories (NSAID): Secondary | ICD-10-CM

## 2016-06-14 MED ORDER — TRAZODONE HCL 50 MG PO TABS
50.0000 mg | ORAL_TABLET | Freq: Every evening | ORAL | Status: DC | PRN
  Administered 2016-06-15: 50 mg via ORAL

## 2016-06-14 MED ORDER — MIRTAZAPINE 7.5 MG PO TABS
7.5000 mg | ORAL_TABLET | Freq: Every day | ORAL | Status: DC
  Administered 2016-06-14 – 2016-06-17 (×4): 7.5 mg via ORAL
  Filled 2016-06-14 (×5): qty 1

## 2016-06-14 MED ORDER — TAMSULOSIN HCL 0.4 MG PO CAPS
0.4000 mg | ORAL_CAPSULE | Freq: Every day | ORAL | Status: DC
  Administered 2016-06-14 – 2016-07-05 (×22): 0.4 mg via ORAL
  Filled 2016-06-14 (×2): qty 1
  Filled 2016-06-14: qty 7
  Filled 2016-06-14 (×16): qty 1
  Filled 2016-06-14: qty 7
  Filled 2016-06-14 (×8): qty 1

## 2016-06-14 MED ORDER — LEVOTHYROXINE SODIUM 75 MCG PO TABS
75.0000 ug | ORAL_TABLET | Freq: Every day | ORAL | Status: DC
  Administered 2016-06-15 – 2016-07-06 (×22): 75 ug via ORAL
  Filled 2016-06-14 (×11): qty 1
  Filled 2016-06-14: qty 7
  Filled 2016-06-14: qty 1
  Filled 2016-06-14: qty 7
  Filled 2016-06-14 (×13): qty 1

## 2016-06-14 MED ORDER — CLONAZEPAM 0.5 MG PO TABS
0.5000 mg | ORAL_TABLET | Freq: Three times a day (TID) | ORAL | Status: DC
  Administered 2016-06-14 – 2016-06-15 (×2): 0.5 mg via ORAL
  Filled 2016-06-14 (×3): qty 1

## 2016-06-14 MED ORDER — DULOXETINE HCL 20 MG PO CPEP
20.0000 mg | ORAL_CAPSULE | Freq: Every day | ORAL | Status: DC
  Administered 2016-06-14 – 2016-06-16 (×3): 20 mg via ORAL
  Filled 2016-06-14 (×6): qty 1

## 2016-06-14 MED ORDER — NICOTINE 21 MG/24HR TD PT24
21.0000 mg | MEDICATED_PATCH | Freq: Every day | TRANSDERMAL | Status: DC
  Administered 2016-06-14 – 2016-07-06 (×23): 21 mg via TRANSDERMAL
  Filled 2016-06-14 (×27): qty 1

## 2016-06-14 MED ORDER — CLONAZEPAM 0.5 MG PO TABS
0.5000 mg | ORAL_TABLET | Freq: Once | ORAL | Status: AC
  Administered 2016-06-14: 0.5 mg via ORAL
  Filled 2016-06-14: qty 1

## 2016-06-14 MED ORDER — FLUOXETINE HCL 20 MG PO CAPS
20.0000 mg | ORAL_CAPSULE | Freq: Every day | ORAL | Status: DC
  Administered 2016-06-15 – 2016-06-21 (×7): 20 mg via ORAL
  Filled 2016-06-14 (×9): qty 1

## 2016-06-14 NOTE — H&P (Signed)
Psychiatric Admission Assessment Adult  Patient Identification: Christopher Giles MRN:  [id number] Date of Evaluation:  06/14/2016 Chief Complaint:  mdd,rec,sev Principal Diagnosis: Major depression, recurrent ([place]) Diagnosis:   Patient Active Problem List   Diagnosis Date Noted  . Major depression, recurrent ([place]) [F33.9] 06/13/2016   History of Present Illness:PER [facility] Assessment Note- Christopher Giles is an 50 y.o. single male who presents unaccompanied to [name] ED reporting severe symptoms of depression and anxiety. Pt reports he has a long history of major depressive disorder and is currently prescribed Cymbalta 60 mg daily and Klonopin 0.5 mg TID PRN by his primary care physician, Dr. Carol Ada. Pt says his medication has been effective for years but over the past two months he feels they are not helping. Pt scales both his depression and anxiety as 10/10. He denies current suicidal ideation but says he recently has had suicidal thoughts and if he doesn't receive inpatient treatment he believes he will decompensate and become suicidal. Pt reports a history of at least six previous suicide attempts including ingesting rat poison, cutting his wrist and overdosing. Pt says he has a history of multiple psychiatric hospitalizations and ECT and his last inpatient treatment was at [facility] in 2008. He denies current homicidal ideation or history of violence. He denies any history of psychotic symptoms. 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  Pt identifies his primary stressor as his mental health symptoms. He states he lost his job two months ago and is in danger of losing his residence. He says he has no health insurance which has hindered his ability to access mental health treatment. He does not current have a psychiatrist or therapist. Pt reports he lives alone, has never been married and has no children. He identifies  his sister as his only support.   The following note was written by the Pt describing his symptoms and concerns:    Associated Signs/Symptoms: Depression Symptoms:  depressed mood, feelings of worthlessness/guilt, difficulty concentrating, (Hypo) Manic Symptoms:  Irritable Mood, Anxiety Symptoms:  Excessive Worry, Social Anxiety, Psychotic Symptoms:  Hallucinations: None PTSD Symptoms: Avoidance:  Decreased Interest/Participation Foreshortened Future Total Time spent with patient: 30 minutes  Past Psychiatric History: See Above  Is the patient at risk to self? Yes.    Has the patient been a risk to self in the past 6 months? Yes.    Has the patient been a risk to self within the distant past? Yes.    Is the patient a risk to others? No.  Has the patient been a risk to others in the past 6 months? No.  Has the patient been a risk to others within the distant past? No.   Prior Inpatient Therapy:   Prior Outpatient Therapy:    Alcohol Screening: 1. How often do you have a drink containing alcohol?: Never 9. Have you or someone else been injured as a result of your drinking?: No 10. Has a relative or friend or a doctor or another health worker been concerned about your drinking or suggested you cut down?: No Alcohol Use Disorder Identification Test Final Score (AUDIT): 0 Brief Intervention: AUDIT score less than 7 or less-screening does not suggest unhealthy drinking-brief intervention not indicated Substance Abuse History in the last 12 months:  Yes.   Consequences of Substance Abuse: Withdrawal Symptoms:   Headaches Previous Psychotropic Medications: YES Psychological Evaluations: YES Past Medical History:  Past Medical History:  Diagnosis Date  . Anxiety   . Depression   . Substance abuse   . Suicide attempt    x 6 per pt  . Thyroid disease    hypo    Past Surgical History:  Procedure Laterality Date  . punctured lung     Family History:  Family History    Problem Relation Age of Onset  . Hyperlipidemia Mother   . Cancer Mother   . Cancer Father   . Diabetes Maternal Grandmother    Family Psychiatric  History: Patient reports father: has hx depression and mother: anxiety/depression  Tobacco Screening: Have you used any form of tobacco in the last 30 days? (Cigarettes, Smokeless Tobacco, Cigars, and/or Pipes): Yes Tobacco use, Select all that apply: 5 or more cigarettes per day Are you interested in Tobacco Cessation Medications?: Yes, will notify MD for an order Counseled patient on smoking cessation including recognizing danger situations, developing coping skills and basic information about quitting provided: Refused/Declined practical counseling Social History:  History  Alcohol Use No     History  Drug Use No    Additional Social History:                           Allergies:  No Known Allergies Lab Results:  Results for orders placed or performed during the hospital encounter of 06/12/16 (from the past 48 hour(s))  Comprehensive metabolic panel     Status: Abnormal   Collection Time: 06/12/16  6:27 PM  Result Value Ref Range   Sodium 136 135 - 145 mmol/L   Potassium 3.5 3.5 - 5.1 mmol/L   Chloride 100 (L) 101 - 111 mmol/L   CO2 25 22 - 32 mmol/L   Glucose, Bld 120 (H) 65 - 99 mg/dL   BUN 9 6 - 20 mg/dL   Creatinine, Ser 0.86 0.61 - 1.24 mg/dL   Calcium 9.8 8.9 - 10.3 mg/dL   Total Protein 6.9 6.5 - 8.1 g/dL   Albumin 4.4 3.5 - 5.0 g/dL   AST 17 15 - 41 U/L   ALT 12 (L) 17 - 63 U/L   Alkaline Phosphatase 55 38 - 126 U/L   Total Bilirubin 0.2 (L) 0.3 - 1.2 mg/dL   GFR calc non Af Amer >60 >60 mL/min   GFR calc Af Amer >60 >60 mL/min    Comment: (NOTE) The eGFR has been calculated using the CKD EPI equation. This calculation has not been validated in all clinical situations. eGFR's persistently <60 mL/min signify possible Chronic Kidney Disease.    Anion gap 11 5 - 15  Ethanol     Status: None    Collection Time: 06/12/16  6:27 PM  Result Value Ref Range   Alcohol, Ethyl (B) <5 <5 mg/dL    Comment:        LOWEST DETECTABLE LIMIT FOR SERUM ALCOHOL IS 5 mg/dL FOR MEDICAL PURPOSES ONLY   Salicylate level     Status: None   Collection Time: 06/12/16  6:27 PM  Result Value Ref Range   Salicylate Lvl <1.6 2.8 - 30.0 mg/dL  Acetaminophen level     Status: Abnormal   Collection Time: 06/12/16  6:27 PM  Result Value Ref Range   Acetaminophen (Tylenol), Serum <10 (L) 10 - 30 ug/mL    Comment:        THERAPEUTIC CONCENTRATIONS VARY SIGNIFICANTLY. A RANGE OF 10-30 ug/mL MAY BE AN EFFECTIVE CONCENTRATION FOR MANY  PATIENTS. HOWEVER, SOME ARE BEST TREATED AT CONCENTRATIONS OUTSIDE THIS RANGE. ACETAMINOPHEN CONCENTRATIONS >150 ug/mL AT 4 HOURS AFTER INGESTION AND >50 ug/mL AT 12 HOURS AFTER INGESTION ARE OFTEN ASSOCIATED WITH TOXIC REACTIONS.   cbc     Status: None   Collection Time: 06/12/16  6:27 PM  Result Value Ref Range   WBC 7.4 4.0 - 10.5 K/uL   RBC 4.82 4.22 - 5.81 MIL/uL   Hemoglobin 15.4 13.0 - 17.0 g/dL   HCT 44.4 39.0 - 52.0 %   MCV 92.1 78.0 - 100.0 fL   MCH 32.0 26.0 - 34.0 pg   MCHC 34.7 30.0 - 36.0 g/dL   RDW 12.2 11.5 - 15.5 %   Platelets 265 150 - 400 K/uL  Rapid urine drug screen (hospital performed)     Status: Abnormal   Collection Time: 06/12/16  6:31 PM  Result Value Ref Range   Opiates NONE DETECTED NONE DETECTED   Cocaine NONE DETECTED NONE DETECTED   Benzodiazepines POSITIVE (A) NONE DETECTED   Amphetamines NONE DETECTED NONE DETECTED   Tetrahydrocannabinol NONE DETECTED NONE DETECTED   Barbiturates NONE DETECTED NONE DETECTED    Comment:        DRUG SCREEN FOR MEDICAL PURPOSES ONLY.  IF CONFIRMATION IS NEEDED FOR ANY PURPOSE, NOTIFY LAB WITHIN 5 DAYS.        LOWEST DETECTABLE LIMITS FOR URINE DRUG SCREEN Drug Class       Cutoff (ng/mL) Amphetamine      1000 Barbiturate      200 Benzodiazepine   202 Tricyclics       542 Opiates           300 Cocaine          300 THC              50   TSH     Status: None   Collection Time: 06/12/16  8:00 PM  Result Value Ref Range   TSH 1.771 0.350 - 4.500 uIU/mL    Blood Alcohol level:  Lab Results  Component Value Date   ETH <5 70/62/3762    Metabolic Disorder Labs:  No results found for: HGBA1C, MPG No results found for: PROLACTIN No results found for: CHOL, TRIG, HDL, CHOLHDL, VLDL, LDLCALC  Current Medications: Current Facility-Administered Medications  Medication Dose Route Frequency Provider Last Rate Last Dose  . acetaminophen (TYLENOL) tablet 650 mg  650 mg Oral Q6H PRN Rozetta Nunnery, NP      . alum & mag hydroxide-simeth (MAALOX/MYLANTA) 200-200-20 MG/5ML suspension 30 mL  30 mL Oral Q4H PRN Rozetta Nunnery, NP      . hydrOXYzine (ATARAX/VISTARIL) tablet 25 mg  25 mg Oral Q6H PRN Rozetta Nunnery, NP   25 mg at 06/13/16 2228  . magnesium hydroxide (MILK OF MAGNESIA) suspension 30 mL  30 mL Oral Daily PRN Rozetta Nunnery, NP      . nicotine (NICODERM CQ - dosed in mg/24 hours) patch 21 mg  21 mg Transdermal Daily Myer Peer Cobos, MD   21 mg at 06/14/16 1026  . traZODone (DESYREL) tablet 50 mg  50 mg Oral QHS,MR X 1 Rozetta Nunnery, NP   50 mg at 06/13/16 2353   PTA Medications: Prescriptions Prior to Admission  Medication Sig Dispense Refill Last Dose  . bismuth subsalicylate (PEPTO BISMOL) 262 MG chewable tablet Chew 524 mg by mouth as needed for indigestion or diarrhea or loose stools.   unk  . clonazePAM (  KLONOPIN) 1 MG tablet Take 1 mg by mouth 3 (three) times daily as needed for anxiety.   06/12/2016 at Unknown time  . DULoxetine (CYMBALTA) 60 MG capsule Take 60 mg by mouth daily.   06/12/2016 at Unknown time  . ibuprofen (ADVIL,MOTRIN) 200 MG tablet Take 400 mg by mouth every 6 (six) hours as needed for moderate pain.   unk  . levothyroxine (SYNTHROID, LEVOTHROID) 50 MCG tablet Take 1 tablet (50 mcg total) by mouth daily before breakfast. (Patient taking differently: Take  75 mcg by mouth daily before breakfast. ) 30 tablet 1 06/12/2016 at Unknown time  . Menthol, Topical Analgesic, (BIOFREEZE ROLL-ON COLORLESS) 4 % GEL Apply 1 application topically daily as needed (pain).   unk  . Menthol-Methyl Salicylate (ICY HOT BALM EXTRA STRENGTH) 7.6-29 % OINT Apply 1 application topically daily as needed (pain).   unk  . naproxen sodium (ANAPROX) 220 MG tablet Take 220 mg by mouth 2 (two) times daily as needed (pain).   unk    Musculoskeletal: Strength & Muscle Tone: within normal limits Gait & Station: normal Patient leans: N/A  Psychiatric Specialty Exam: Physical Exam  Nursing note and vitals reviewed. Constitutional: He appears well-developed and well-nourished.  Musculoskeletal: Normal range of motion.  Skin: Skin is warm and dry.    Review of Systems  Psychiatric/Behavioral: Positive for depression and suicidal ideas. The patient is nervous/anxious and has insomnia.     Blood pressure (!) 101/59, pulse (!) 104, temperature 97.7 F (36.5 C), temperature source Oral, resp. rate 17, height _0  (1.905 m), weight 88.9 kg (196 lb).Body mass index is 24.5 kg/m.  General Appearance: Disheveled  Eye Contact:  Minimal  Speech:  Clear and Coherent  Volume:  Normal  Mood:  Depressed  Affect:  Blunt, Depressed and Flat  Thought Process:  Coherent  Orientation:  Full (Time, Place, and Person)  Thought Content:  Illogical and Rumination  Suicidal Thoughts:  Yes.  without intent/plan  Homicidal Thoughts:  No  Memory:  Immediate;   Fair Recent;   Fair Remote;   Fair  Judgement:  Fair  Insight:  Present  Psychomotor Activity:  Restlessness  Concentration:  Concentration: Poor  Recall:  Good  Fund of Knowledge:  Good  Language:  Good  Akathisia:  No  Handed:  Right  AIMS (if indicated):     Assets:  Communication Skills Desire for Improvement Resilience Social Support  ADL's:  Intact  Cognition:  WNL  Sleep:  Number of Hours: 5.75     I agree with  current treatment plan on 06/14/2016, Patient seen face-to-face for psychiatric evaluation follow-up, chart reviewed and case discussed with the MD Cobos, and Treatment team. Reviewed the information documented and agree with the treatment plan.  Treatment Plan Summary: Daily contact with patient to assess and evaluate symptoms and progress in treatment and Medication management Continue with Trazodone 50 mg for insomnia Consider other antidepressants for mood stabilization. Abilify or Wellbutrin was discussed Will continue to monitor vitals ,medication compliance and treatment side effects while patient is here.  Reviewed labs:  elevated ,BAL - 0, UDS - positive  benzodizpines. CSW will start working on disposition.  Patient to participate in therapeutic milieu    Observation Level/Precautions:  15 minute checks  Laboratory:  CBC Chemistry Profile UDS UA  Psychotherapy:  Individual and group session  Medications:  See Above  Consultations:  Psychiatry  Discharge Concerns:  Safety, stabilization, and risk of access to medication and medication stabilization  Estimated LOS:5-7days  Other:     Physician Treatment Plan for Primary Diagnosis: Major depression, recurrent ([place]) Long Term Goal(s): Improvement in symptoms so as ready for discharge  Short Term Goals: Ability to identify changes in lifestyle to reduce recurrence of condition will improve, Ability to verbalize feelings will improve, Ability to identify and develop effective coping behaviors will improve and Compliance with prescribed medications will improve  Physician Treatment Plan for Secondary Diagnosis: Principal Problem:   Major depression, recurrent ([place])  Long Term Goal(s): Improvement in symptoms so as ready for discharge  Short Term Goals: Ability to identify changes in lifestyle to reduce recurrence of condition will improve, Ability to demonstrate self-control will improve, Ability to maintain clinical  measurements within normal limits will improve and Ability to identify triggers associated with substance abuse/mental health issues will improve  I certify that inpatient services furnished can reasonably be expected to improve the patient's condition.    Derrill Center, NP 10/2/201711:22 AM   I have discussed case with treatment team and have met with patient  Agree with NP note and assessment  50 year old male, presented to ED due to worsening depression, feelings of hopelessness, and increasing social isolation. He denies active suicidal ideations, but appeared distressed and fearful of discharge in ED, and has endorsed a long history of depression, anxiety, with history of prior suicide attempts, last one several years ago. Describes anxiety as both a tendency to worry excessively and having difficulty with social interactions due to increased anxiety. He reports significant psychosocial stressors, to include a limited support network , being unemployed, financial difficulties . Patient has been taking psychiatric medications ( Cymbalta, Klonopin PRNs ) but feels that medications are no longer helping . Dx- Major Depression, Recurrent , no Psychotic Features. Consider Social Phobia/ GAD by history . Plan- inpatient admission . We discussed treatment options - as noted , patient does not think Cymbalta is working at this time ( taking at 60 mgrs QDAY )  1. Taper Cymbalta to 20 mgrs QDAY - will taper gradually to minimize possible WDL. 2. Start  Prozac 20 mgrs QDAY for depression, anxiety 3. Start Remeron 7.5 mgrs QHS for antidepressant augmentation, may also help improve sleep, appetite, both of which have been poor  4. Continue Klonopin at 0.5 mgrs TID, consider gradual taper.

## 2016-06-14 NOTE — [facility] Group Notes (Signed)
Adult Psychoeducational Group Note  Date:  06/14/2016 Time:  1:30 PM  Overcoming Obstacles  Participation Level:  Minimal  Participation Quality:  Limited  Affect:  Appropriate  Cognitive:  Appropriate  Insight: Improving  Engagement in Group:  Developing/Improving  Modes of Intervention:  Discussion, Exploration and Support  Additional Comments: Today's Topic: Overcoming Obstacles. Patients identified one short term goal and potential obstacles in reaching this goal. Patients processed barriers involved in overcoming these obstacles. Patients identified steps necessary for overcoming these obstacles and explored motivation (internal and external) for facing these difficulties head on. New to unit, mostly quiet.  At wrap up, stated he felt that the 400 unit was a safe place to discuss past hurts/issues and that he gained insight through shares of others.     Sallee Langenne C Cunningham 06/14/2016, 5:44 PM

## 2016-06-14 NOTE — Progress Notes (Signed)
Recreation Therapy Notes  Date: 06/14/16  Time: 0930 Location: 300 Hall Dayroom  Group Topic: Stress Management  Goal Area(s) Addresses:  Patient will verbalize importance of using healthy stress management.  Patient will identify positive emotions associated with healthy stress management.   Intervention: Guided Imagery  Activity :  Engineer, technical saleseaceful Place Imagery.  LRT introduced to the technique of guided imagery to patients.  LRT read a script so patients could participate in the technique.  Patients were to follow along as LRT read script.  Education:  Stress Management, Discharge Planning.   Education Outcome: Acknowledges edcuation/In group clarification offered/Needs additional education  Clinical Observations/Feedback: Pt did not attend group.      Caroll RancherMarjette Camiah Humm, LRT/CTRS         Lillia AbedLindsay, Chanice Brenton A 06/14/2016 12:00 PM

## 2016-06-14 NOTE — Progress Notes (Addendum)
Nursing Note 06/14/2016 [id number]  Data Reports sleeping fair, but reports feeling tired and terrible this AM.  Rates depression 10/10, hopelessness 10/10, and anxiety 10/10.  Slept in until lunch, but observed in milieu interacting, and attending groups in afternoon.  Affect blunted but appropriate. Denies HI, SI, AVH.     Action Spoke with patient 1:1, nurse offered support to patient throughout shift.  Continues to be monitored on 15 minute checks for safety.  Response Remains safe and appropriate on unit.

## 2016-06-14 NOTE — [facility] Suicide Risk Assessment (Signed)
[facility] Admission Suicide Risk Assessment   Nursing information obtained from:   patient and chart  Demographic factors:   50 year old male, single, unemployed  Current Mental Status:   see below Loss Factors:   limited support network , currently unemployed, concerned about losing home . Historical Factors:   anxiety, depression  Risk Reduction Factors:   resilience   Total Time spent with patient: 45 minutes Principal Problem: Major depression, recurrent ([facility]) Diagnosis:   Patient Active Problem List   Diagnosis Date Noted  . Major depression, recurrent ([facility]) [F33.9] 06/13/2016     Continued Clinical Symptoms:  Alcohol Use Disorder Identification Test Final Score (AUDIT): 0 The "Alcohol Use Disorders Identification Test", Guidelines for Use in Primary Care, Second Edition.  World Science writer [facility]). Score between 0-7:  no or low risk or alcohol related problems. Score between 8-15:  moderate risk of alcohol related problems. Score between 16-19:  high risk of alcohol related problems. Score 20 or above:  warrants further diagnostic evaluation for alcohol dependence and treatment.   CLINICAL FACTORS:  50 year old male, presented to ED due to worsening depression, feelings of hopelessness, and increasing social isolation. He denies active suicidal ideations, but appeared distressed and fearful of discharge in ED, and has endorsed a long history of depression, anxiety, with history of prior suicide attempts, last one several years ago. Describes anxiety as both a tendency to worry excessively and having difficulty with social interactions due to increased anxiety. He reports significant psychosocial stressors, to include a limited support network , being unemployed, financial difficulties . Patient has been taking psychiatric medications ( Cymbalta, Klonopin PRNs ) but feels that medications are no longer helping . Dx- Major Depression, Recurrent , no Psychotic Features. Consider  Social Phobia/ GAD by history . Plan- inpatient admission . We discussed treatment options - as noted , patient does not think Cymbalta is working at this time ( taking at 60 mgrs QDAY )  1. Taper Cymbalta to 20 mgrs QDAY - will taper gradually to minimize possible WDL. 2. Start  Prozac 20 mgrs QDAY for depression, anxiety 3. Start Remeron 7.5 mgrs QHS for antidepressant augmentation, may also help improve sleep, appetite, both of which have been poor  4. Continue Klonopin at 0.5 mgrs TID, consider gradual taper.      Musculoskeletal: Strength & Muscle Tone: within normal limits Gait & Station: normal Patient leans: N/A  Psychiatric Specialty Exam: Physical Exam  ROS  Blood pressure (!) 101/59, pulse (!) 104, temperature 97.7 F (36.5 C), temperature source Oral, resp. rate 17, height 6\' 3"  (1.905 m), weight 196 lb (88.9 kg).Body mass index is 24.5 kg/m.  General Appearance: Fairly Groomed  Eye Contact:  Good  Speech:  Normal Rate  Volume:  Normal  Mood:  Anxious and Depressed  Affect:  constricted, anxious   Thought Process:  Linear  Orientation:  Other:  fully alert and attentive   Thought Content:  no hallucinations, no delusions, not internally preoccupied   Suicidal Thoughts:  No at this time denies active suicidal ideations or self injurious ideations and contracts for safety on unit   Homicidal Thoughts:  No  Memory:  recent and remote grossly intact   Judgement:  Fair  Insight:  Fair  Psychomotor Activity:  Normal  Concentration:  Concentration: Good and Attention Span: Good  Recall:  Good  Fund of Knowledge:  Good  Language:  Good  Akathisia:  Negative  Handed:  Right  AIMS (if  indicated):     Assets:  Desire for Improvement Resilience  ADL's:  Intact  Cognition:  WNL  Sleep:  Number of Hours: 5.75    COGNITIVE FEATURES THAT CONTRIBUTE TO RISK:  Closed-mindedness and Loss of executive function    SUICIDE RISK:   Moderate:  Frequent suicidal ideation  with limited intensity, and duration, some specificity in terms of plans, no associated intent, good self-control, limited dysphoria/symptomatology, some risk factors present, and identifiable protective factors, including available and accessible social support.   PLAN OF CARE: Patient will be admitted to inpatient psychiatric unit for stabilization and safety. Will provide and encourage milieu participation. Provide medication management and maked adjustments as needed.  Will follow daily.    I certify that inpatient services furnished can reasonably be expected to improve the patient's condition.  Nehemiah MassedOBOS, FERNANDO, MD 06/14/2016, 5:51 PM

## 2016-06-14 NOTE — Tx Team (Signed)
Initial Treatment Plan 06/14/2016 1:02 AM Christopher BanningJames C Saban ZOX:[id number]    PATIENT STRESSORS: Medication change or noncompliance Occupational concerns   PATIENT STRENGTHS: Average or above average intelligence Motivation for treatment/growth Physical Health Supportive family/friends   PATIENT IDENTIFIED PROBLEMS: Depression  Suicidal Ideation  Anxiety  "Get my medication changed, this is not working"               DISCHARGE CRITERIA:  Adequate post-discharge living arrangements Improved stabilization in mood, thinking, and/or behavior Need for constant or close observation no longer present Verbal commitment to aftercare and medication compliance  PRELIMINARY DISCHARGE PLAN: Outpatient therapy Placement in alternative living arrangements  PATIENT/FAMILY INVOLVEMENT: This treatment plan has been presented to and reviewed with the patient, Christopher Giles.  The patient and family have been given the opportunity to ask questions and make suggestions.  Juliann ParesBowman, Belem Hintze Elizabeth, RN 06/14/2016, 1:02 AM

## 2016-06-14 NOTE — Tx Team (Signed)
Interdisciplinary Treatment and Diagnostic Plan Update  06/14/2016 Time of Session: 10 AM Christopher Giles MRN: [id number]  Principal Diagnosis: Major depression, recurrent (HCC)  Secondary Diagnoses: Principal Problem:   Major depression, recurrent (HCC)   Current Medications:  Current Facility-Administered Medications  Medication Dose Route Frequency Provider Last Rate Last Dose  . acetaminophen (TYLENOL) tablet 650 mg  650 mg Oral Q6H PRN Jackelyn Poling, NP      . alum & mag hydroxide-simeth (MAALOX/MYLANTA) 200-200-20 MG/5ML suspension 30 mL  30 mL Oral Q4H PRN Jackelyn Poling, NP      . clonazePAM (KLONOPIN) tablet 0.5 mg  0.5 mg Oral TID Craige Cotta, MD   0.5 mg at 06/14/16 1724  . DULoxetine (CYMBALTA) DR capsule 20 mg  20 mg Oral Daily Craige Cotta, MD   20 mg at 06/14/16 1724  . FLUoxetine (PROZAC) capsule 20 mg  20 mg Oral Daily Craige Cotta, MD   Stopped at 06/14/16 1759  . hydrOXYzine (ATARAX/VISTARIL) tablet 25 mg  25 mg Oral Q6H PRN Jackelyn Poling, NP   25 mg at 06/13/16 2228  . magnesium hydroxide (MILK OF MAGNESIA) suspension 30 mL  30 mL Oral Daily PRN Jackelyn Poling, NP      . mirtazapine (REMERON) tablet 7.5 mg  7.5 mg Oral QHS Fernando A Cobos, MD      . nicotine (NICODERM CQ - dosed in mg/24 hours) patch 21 mg  21 mg Transdermal Daily Rockey Situ Cobos, MD   21 mg at 06/14/16 1026  . traZODone (DESYREL) tablet 50 mg  50 mg Oral QHS PRN Craige Cotta, MD       PTA Medications: Prescriptions Prior to Admission  Medication Sig Dispense Refill Last Dose  . bismuth subsalicylate (PEPTO BISMOL) 262 MG chewable tablet Chew 524 mg by mouth as needed for indigestion or diarrhea or loose stools.   unk  . clonazePAM (KLONOPIN) 1 MG tablet Take 1 mg by mouth 3 (three) times daily as needed for anxiety.   06/12/2016 at Unknown time  . DULoxetine (CYMBALTA) 60 MG capsule Take 60 mg by mouth daily.   06/12/2016 at Unknown time  . ibuprofen (ADVIL,MOTRIN) 200 MG tablet Take  400 mg by mouth every 6 (six) hours as needed for moderate pain.   unk  . levothyroxine (SYNTHROID, LEVOTHROID) 50 MCG tablet Take 1 tablet (50 mcg total) by mouth daily before breakfast. (Patient taking differently: Take 75 mcg by mouth daily before breakfast. ) 30 tablet 1 06/12/2016 at Unknown time  . Menthol, Topical Analgesic, (BIOFREEZE ROLL-ON COLORLESS) 4 % GEL Apply 1 application topically daily as needed (pain).   unk  . Menthol-Methyl Salicylate (ICY HOT BALM EXTRA STRENGTH) 7.6-29 % OINT Apply 1 application topically daily as needed (pain).   unk  . naproxen sodium (ANAPROX) 220 MG tablet Take 220 mg by mouth 2 (two) times daily as needed (pain).   unk    Patient Stressors: Medication change or noncompliance Occupational concerns  Patient Strengths: Average or above average intelligence Motivation for treatment/growth Physical Health Supportive family/friends  Treatment Modalities: Medication Management, Group therapy, Case management,  1 to 1 session with clinician, Psychoeducation, Recreational therapy.   Physician Treatment Plan for Primary Diagnosis: Major depression, recurrent (HCC) Long Term Goal(s): Improvement in symptoms so as ready for discharge Improvement in symptoms so as ready for discharge   Short Term Goals: Ability to identify changes in lifestyle to reduce recurrence of condition will improve  Ability to verbalize feelings will improve Ability to identify and develop effective coping behaviors will improve Compliance with prescribed medications will improve Ability to identify changes in lifestyle to reduce recurrence of condition will improve Ability to demonstrate self-control will improve Ability to maintain clinical measurements within normal limits will improve Ability to identify triggers associated with substance abuse/mental health issues will improve  Medication Management: Evaluate patient's response, side effects, and tolerance of medication  regimen.  Therapeutic Interventions: 1 to 1 sessions, Unit Group sessions and Medication administration.  Evaluation of Outcomes: Progressing  Physician Treatment Plan for Secondary Diagnosis: Principal Problem:   Major depression, recurrent (HCC)  Long Term Goal(s): Improvement in symptoms so as ready for discharge Improvement in symptoms so as ready for discharge   Short Term Goals: Ability to identify changes in lifestyle to reduce recurrence of condition will improve Ability to verbalize feelings will improve Ability to identify and develop effective coping behaviors will improve Compliance with prescribed medications will improve Ability to identify changes in lifestyle to reduce recurrence of condition will improve Ability to demonstrate self-control will improve Ability to maintain clinical measurements within normal limits will improve Ability to identify triggers associated with substance abuse/mental health issues will improve     Medication Management: Evaluate patient's response, side effects, and tolerance of medication regimen.  Therapeutic Interventions: 1 to 1 sessions, Unit Group sessions and Medication administration.  Evaluation of Outcomes: Progressing   RN Treatment Plan for Primary Diagnosis: Major depression, recurrent (HCC) Long Term Goal(s): Knowledge of disease and therapeutic regimen to maintain health will improve  Short Term Goals: Ability to disclose and discuss suicidal ideas and Ability to identify and develop effective coping behaviors will improve  Medication Management: RN will administer medications as ordered by provider, will assess and evaluate patient's response and provide education to patient for prescribed medication. RN will report any adverse and/or side effects to prescribing provider.  Therapeutic Interventions: 1 on 1 counseling sessions, Psychoeducation, Medication administration, Evaluate responses to treatment, Monitor vital signs  and CBGs as ordered, Perform/monitor CIWA, COWS, AIMS and Fall Risk screenings as ordered, Perform wound care treatments as ordered.  Evaluation of Outcomes: Progressing   LCSW Treatment Plan for Primary Diagnosis: Major depression, recurrent (HCC) Long Term Goal(s): Safe transition to appropriate next level of care at discharge, Engage patient in therapeutic group addressing interpersonal concerns.  Short Term Goals: Engage patient in aftercare planning with referrals and resources, Increase social support and Increase ability to appropriately verbalize feelings  Therapeutic Interventions: Assess for all discharge needs, 1 to 1 time with Social worker, Explore available resources and support systems, Assess for adequacy in community support network, Educate family and significant other(s) on suicide prevention, Complete Psychosocial Assessment, Interpersonal group therapy.  Evaluation of Outcomes: Progressing   Progress in Treatment: Attending groups: Yes. Participating in groups: Yes. Taking medication as prescribed: Yes. Toleration medication: Yes. Family/Significant other contact made: No, will contact:  collaterals w patient permission Patient understands diagnosis: Yes. Discussing patient identified problems/goals with staff: Yes. Medical problems stabilized or resolved: Yes. Denies suicidal/homicidal ideation: No. and As evidenced by:  significant risk factors for suicide, admitted w SI, inadequate coping skills in community Issues/concerns per patient self-inventory: No. Other: na  New problem(s) identified: No, Describe:  none at this time  New Short Term/Long Term Goal(s):  Discharge Plan or Barriers: limited support network, unstable housing  Reason for Continuation of Hospitalization: Depression Medication stabilization Suicidal ideation  Estimated Length of Stay: 3 - 5  days  Attendees: Patient: 06/14/2016 6:12 PM  Physician: Sallyanne HaversF Cobos MD 06/14/2016 6:12 PM   Nursing: Mikki HarborMarian F RN, Coralie Commonan M RN, [facility] RoadBeverly RN 06/14/2016 6:12 PM  RN Care Manager: Sondra BargesJ Clark RN 06/14/2016 6:12 PM  Social Worker: Governor RooksA Cahlil Sattar LCSW 06/14/2016 6:12 PM  Recreational Therapist:  06/14/2016 6:12 PM  Other:  06/14/2016 6:12 PM  Other:  06/14/2016 6:12 PM  Other: 06/14/2016 6:12 PM    Scribe for Treatment Team: Sallee LangeAnne C Artist Bloom, LCSW 06/14/2016 6:12 PM

## 2016-06-14 NOTE — Progress Notes (Signed)
Admission note:  Pt is a 50 year old Caucasian male admitted to the services of Dr. Jama Flavorsobos for depression which has led him to have suicidal thoughts.  Pt has not acted on this at this time.  Pt has recently lost his job and his sister is his only support.  Pt reports that his present medications are not working and he is here to get started on some new medications that will work.  Pt reports sleeping all day and that his depression and hopelessness are both 10/10.  Pt is cooperative with the admission process although very anxious.  Pt lists his sister as his emergency contract.

## 2016-06-15 MED ORDER — CLONAZEPAM 0.5 MG PO TABS
0.5000 mg | ORAL_TABLET | Freq: Three times a day (TID) | ORAL | Status: DC
  Administered 2016-06-15 – 2016-06-18 (×8): 0.5 mg via ORAL
  Filled 2016-06-15 (×8): qty 1

## 2016-06-15 MED ORDER — TRAZODONE HCL 50 MG PO TABS: 25.0000 mg | ORAL_TABLET | Freq: Every evening | ORAL | Status: DC | PRN

## 2016-06-15 NOTE — Progress Notes (Signed)
Recreation Therapy Notes  Animal-Assisted Activity (AAA) Program Checklist/Progress Notes Patient Eligibility Criteria Checklist & Daily Group note for Rec TxIntervention  Date: 10.03.2017 Time: 2:45pm Location: [address] Dayroom    AAA/T Program Assumption of Risk Form signed by Patient/ or Parent Legal Guardian Yes  Patient is free of allergies or sever asthma Yes  Patient reports no fear of animals Yes  Patient reports no history of cruelty to animals Yes  Patient understands his/her participation is voluntary Yes  Patient washes hands before animal contact Yes  Patient washes hands after animal contact Yes  Behavioral Response: Engaged, Attentive   Education:Hand Washing, Appropriate Animal Interaction   Education Outcome: Acknowledges education.   Clinical Observations/Feedback: Patient attended session and interacted appropriately with therapy dog and peers. .   Matthew Pais L Francie Keeling, LRT/CTRS  Kinney Sackmann L 06/15/2016 3:06 PM 

## 2016-06-15 NOTE — Progress Notes (Signed)
[facility] MD Progress Note  06/15/2016 5:00 PM Christopher Giles  MRN:  [id number] Subjective:  Patient reports he continues to feel depressed, anxious. Today denies any suicidal ideations, contracts for safety on unit. He has reported dizziness /lightheadedness , mainly this morning . At this time it is improved, and denies current dizziness or pre-syncopal symptoms.  Objective: I have discussed case with treatment team and have met with patient . Patient remains depressed, with a dysphoric, constricted affect. He denies suicidal ideations, he denies any psychotic symptoms, and does not appear internally preoccupied . No disruptive or agitated behaviors on unit . As noted, above , reports some dizziness , lightheadedness, worse this AM, currently improved . As per staff, chart notes, patient has continued to rate depression and anxiety as severe, but has denied suicidal ideations. Patient continues to ruminate about psychosocial stressors, mainly financial, housing issues.  Responds only partially to support, empathy, but affect did improve slightly during session.   Principal Problem: Major depression, recurrent ([place]) Diagnosis:   Patient Active Problem List   Diagnosis Date Noted  . Major depression, recurrent ([place]) [F33.9] 06/13/2016   Total Time spent with patient: 20 minutes    Past Medical History:  Past Medical History:  Diagnosis Date  . Anxiety   . Depression   . Substance abuse   . Suicide attempt    x 6 per pt  . Thyroid disease    hypo    Past Surgical History:  Procedure Laterality Date  . punctured lung     Family History:  Family History  Problem Relation Age of Onset  . Hyperlipidemia Mother   . Cancer Mother   . Cancer Father   . Diabetes Maternal Grandmother     Social History:  History  Alcohol Use No     History  Drug Use No    Social History   Social History  . Marital status: Single    Spouse name: N/A  . Number of children: N/A  . Years of  education: N/A   Social History Main Topics  . Smoking status: Current Every Day Smoker    Packs/day: 1.00    Types: Cigarettes  . Smokeless tobacco: Never Used  . Alcohol use No  . Drug use: No  . Sexual activity: Not Asked   Other Topics Concern  . None   Social History Narrative  . None   Additional Social History:   Sleep: Fair  Appetite:  Fair  Current Medications: Current Facility-Administered Medications  Medication Dose Route Frequency Provider Last Rate Last Dose  . acetaminophen (TYLENOL) tablet 650 mg  650 mg Oral Q6H PRN Rozetta Nunnery, NP      . alum & mag hydroxide-simeth (MAALOX/MYLANTA) 200-200-20 MG/5ML suspension 30 mL  30 mL Oral Q4H PRN Rozetta Nunnery, NP      . clonazePAM (KLONOPIN) tablet 0.5 mg  0.5 mg Oral TID Jenne Campus, MD      . DULoxetine (CYMBALTA) DR capsule 20 mg  20 mg Oral Daily Jenne Campus, MD   20 mg at 06/15/16 0810  . FLUoxetine (PROZAC) capsule 20 mg  20 mg Oral Daily Jenne Campus, MD   20 mg at 06/15/16 0810  . hydrOXYzine (ATARAX/VISTARIL) tablet 25 mg  25 mg Oral Q6H PRN Rozetta Nunnery, NP   25 mg at 06/14/16 2136  . levothyroxine (SYNTHROID, LEVOTHROID) tablet 75 mcg  75 mcg Oral QAC breakfast Jenne Campus, MD   75  mcg at 06/15/16 0618  . magnesium hydroxide (MILK OF MAGNESIA) suspension 30 mL  30 mL Oral Daily PRN Rozetta Nunnery, NP      . mirtazapine (REMERON) tablet 7.5 mg  7.5 mg Oral QHS Myer Peer Nature Vogelsang, MD   7.5 mg at 06/14/16 2240  . nicotine (NICODERM CQ - dosed in mg/24 hours) patch 21 mg  21 mg Transdermal Daily Jenne Campus, MD   21 mg at 06/15/16 0811  . tamsulosin (FLOMAX) capsule 0.4 mg  0.4 mg Oral QHS Myer Peer Brenan [place], MD   0.4 mg at 06/14/16 2134  . traZODone (DESYREL) tablet 25 mg  25 mg Oral QHS PRN Jenne Campus, MD        Lab Results: No results found for this or any previous visit (from the past 48 hour(s)).  Blood Alcohol level:  Lab Results  Component Value Date   ETH <5 27/02/2375     Metabolic Disorder Labs: No results found for: HGBA1C, MPG No results found for: PROLACTIN No results found for: CHOL, TRIG, HDL, CHOLHDL, VLDL, LDLCALC  Physical Findings: AIMS: Facial and Oral Movements Muscles of Facial Expression: None, normal Lips and Perioral Area: None, normal Jaw: None, normal Tongue: None, normal,Extremity Movements Upper (arms, wrists, hands, fingers): None, normal Lower (legs, knees, ankles, toes): None, normal, Trunk Movements Neck, shoulders, hips: None, normal, Overall Severity Severity of abnormal movements (highest score from questions above): None, normal Incapacitation due to abnormal movements: None, normal Patient's awareness of abnormal movements (rate only patient's report): No Awareness, Dental Status Current problems with teeth and/or dentures?: No Does patient usually wear dentures?: No  CIWA:    COWS:     Musculoskeletal: Strength & Muscle Tone: within normal limits Gait & Station: normal Patient leans: N/A  Psychiatric Specialty Exam: Physical Exam  ROS dizziness, lightheadedness this AM , no chest pain or shortness of breath   Blood pressure 99/73, pulse 72, temperature 97.7 F (36.5 C), resp. rate 16, height 6' 3" (1.905 m), weight 196 lb (88.9 kg).Body mass index is 24.5 kg/m.  General Appearance: Fairly Groomed  Eye Contact:  Good  Speech:  Normal Rate  Volume:  Normal  Mood:  Depressed and Dysphoric  Affect:  Constricted, vaguely anxious   Thought Process:  Linear  Orientation:  Other:  alert and attentive   Thought Content:  no hallucinations, no delusions, not internally preoccupied   Suicidal Thoughts:  No- at this time denies any active suicidal ideations or self injurious ideations, and contracts for safety on unit   Homicidal Thoughts:  No no homicidal or violent ideations   Memory:  recent and remote grossly intact   Judgement:  Fair  Insight:  Fair  Psychomotor Activity:  Decreased  Concentration:   Concentration: Good and Attention Span: Good  Recall:  Good  Fund of Knowledge:  Good  Language:  Good  Akathisia:  Negative  Handed:  Right  AIMS (if indicated):     Assets:  Desire for Improvement Resilience  ADL's:  Intact  Cognition:  WNL  Sleep:  Number of Hours: 5.5   Assessment - patient remains depressed, constricted in affect, dysphoric. At this time he reports he remains hopeful that he will improve and states that although he feels " terrible", he has no suicidal ideations, because he trusts he will gradually get better, improve . Reported dizziness and lightheadedness earlier this AM, now improved. BPs have tended to be low These symptoms may be related to Trazodone ,  as he states he felt worse this AM after awakening and has been feeling better as day progresses.  Of note, at this time gait steady, and denies any current dizziness .    Treatment Plan Summary: Daily contact with patient to assess and evaluate symptoms and progress in treatment, Medication management, Plan inpatient admission  and medications as below  Encourage group , milieu participation to work on coping skills and symptom reduction  Continue Klonopin 0.5 mgrs TID for anxiety, taper gradually. Continue Prozac 20 mgrs QDAY for depression, anxiety Continue Cymbalta 20 mgrs QDAY - currently being tapered down, (cross taper with Fluoxetine) - will monitor for potential Cymbalta withdrawal symptoms. Continue Vistaril 25 mgrs Q 6 hours PRN for anxiety as needed Continue Remeron 7.5 mgrs QHS for depression and insomnia  Discontinue Trazodone  Treatment team working on disposition planning options  Neita Garnet, MD 06/15/2016, 5:00 PM

## 2016-06-15 NOTE — Progress Notes (Signed)
Christopher Giles. Rontae had been up and visible in milieu this evening, did attend and participate in evening group activity. [name] did endorse on-going anxiety and issues with sleep. [name] spoke briefly about some events leading up to hospitalization and was appropriate throughout conversation. [name] did ask for and receive all bedtime medications without incident and did not verbalize any complaints of pain. A. Support and encouragement provided. R. Safety maintained, will continue to monitor.
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
## 2016-06-15 NOTE — Progress Notes (Signed)
D: Pt presents with flat affect and depressed mood. Pt rates depression 10/10. Pt reported increased depression due to financial issues. Pt denies suicidal thoughts and verbally contracts for safety. Pt hypotensive this morning and reported feeling dizzy. Pt given fluids and advised to increase fluid intake today. Pt educated on fall risk. MD made aware. Klonopin held this morning due to dizziness.  A: Medications reviewed with pt. Medications administered as ordered per MD. Verbal support provided. 15 minute checks performed for safety. B/p monitored.  R: Pt receptive to tx.

## 2016-06-15 NOTE — [facility] Group Notes (Signed)
[facility] LCSW Group Therapy 06/15/2016 1:15 PM  Type of Therapy: Group Therapy- Feelings about Diagnosis  Participation Level: Reserved  Participation Quality:  Appropriate  Affect:  Appropriate  Cognitive: Alert and Oriented   Insight:  Developing   Engagement in Therapy: Developing/Improving and Engaged   Modes of Intervention: Clarification, Confrontation, Discussion, Education, Exploration, Limit-setting, Orientation, Problem-solving, Rapport Building, Dance movement psychotherapisteality Testing, Socialization and Support  Description of Group:   This group will allow patients to explore their thoughts and feelings about diagnoses they have received. Patients will be guided to explore their level of understanding and acceptance of these diagnoses. Facilitator will encourage patients to process their thoughts and feelings about the reactions of others to their diagnosis, and will guide patients in identifying ways to discuss their diagnosis with significant others in their lives. This group will be process-oriented, with patients participating in exploration of their own experiences as well as giving and receiving support and challenge from other group members.  Summary of Progress/Problems:  Pt was reserved in group discussion but was attentive throughout. He identified his family not being able to relate to his mental illness as a stressor. Pt expressed feeling isolated in his struggle.   Therapeutic Modalities:   Cognitive Behavioral Therapy Solution Focused Therapy Motivational Interviewing Relapse Prevention Therapy  Chad CordialLauren Carter, LCSWA 06/15/2016 4:20 PM

## 2016-06-16 MED ORDER — GABAPENTIN 100 MG PO CAPS
100.0000 mg | ORAL_CAPSULE | Freq: Three times a day (TID) | ORAL | Status: DC
  Administered 2016-06-16 – 2016-06-18 (×6): 100 mg via ORAL
  Filled 2016-06-16 (×9): qty 1

## 2016-06-16 MED ORDER — MAGNESIUM CITRATE PO SOLN: 1.0000 | Freq: Once | ORAL | Status: DC | PRN

## 2016-06-16 MED ORDER — GABAPENTIN 100 MG PO CAPS: 100.0000 mg | ORAL_CAPSULE | Freq: Three times a day (TID) | ORAL | Status: DC

## 2016-06-16 NOTE — Progress Notes (Signed)
Adult Psychoeducational Group Note  Date:  06/16/2016 Time:  10:53 PM  Group Topic/Focus:  Wrap-Up Group:   The focus of this group is to help patients review their daily goal of treatment and discuss progress on daily workbooks.   Participation Level:  Active  Participation Quality:  Appropriate  Affect:  Appropriate  Cognitive:  Alert and Appropriate  Insight: Appropriate  Engagement in Group:  Engaged  Modes of Intervention:  Discussion  Additional Comments:  Patient attended group and said that his day was a 5.  His goal for today was to get his medication adjusted and he did.   Francois Elk W Erine Phenix 06/16/2016, 10:53 PM

## 2016-06-16 NOTE — Progress Notes (Signed)
[facility] MD Progress Note  06/16/2016 2:20 PM Christopher Giles  MRN:  [id number] Subjective:  Patient states he remains significantly depressed, but does state his mood is slightly improved and that he feels " like something is starting to get better ". Denies medication side effects. Although he acknowledges improvement as above, he emphasizes he still fees quite depressed, and expresses fear about possibly being discharged from inpatient setting " too early and losing the little bit that I have gained ".   Objective: I have discussed case with treatment team and have met with patient . Patient remains depressed, vaguely anxious , but as above states he is feeling slightly better, although still far from baseline . Grooming is fair, but has improved . He denies medication side effects and today expresses hope that medications will help. He had reported some dizziness yesterday, which he states has improved today. No disruptive or agitated behaviors on unit .Has been visible on unit, going to some groups. Patient states it is difficult for him to be in social situations, and describes long history of social anxiety type symptoms.  Principal Problem: Major depression, recurrent ([place]) Diagnosis:   Patient Active Problem List   Diagnosis Date Noted  . Major depression, recurrent ([place]) [F33.9] 06/13/2016   Total Time spent with patient: 20 minutes    Past Medical History:  Past Medical History:  Diagnosis Date  . Anxiety   . Depression   . Substance abuse   . Suicide attempt    x 6 per pt  . Thyroid disease    hypo    Past Surgical History:  Procedure Laterality Date  . punctured lung     Family History:  Family History  Problem Relation Age of Onset  . Hyperlipidemia Mother   . Cancer Mother   . Cancer Father   . Diabetes Maternal Grandmother     Social History:  History  Alcohol Use No     History  Drug Use No    Social History   Social History  . Marital status: Single     Spouse name: N/A  . Number of children: N/A  . Years of education: N/A   Social History Main Topics  . Smoking status: Current Every Day Smoker    Packs/day: 1.00    Types: Cigarettes  . Smokeless tobacco: Never Used  . Alcohol use No  . Drug use: No  . Sexual activity: Not Asked   Other Topics Concern  . None   Social History Narrative  . None   Additional Social History:   Sleep: Fair, but has improved partially   Appetite:  Fair  Current Medications: Current Facility-Administered Medications  Medication Dose Route Frequency Provider Last Rate Last Dose  . acetaminophen (TYLENOL) tablet 650 mg  650 mg Oral Q6H PRN Rozetta Nunnery, NP      . alum & mag hydroxide-simeth (MAALOX/MYLANTA) 200-200-20 MG/5ML suspension 30 mL  30 mL Oral Q4H PRN Rozetta Nunnery, NP      . clonazePAM (KLONOPIN) tablet 0.5 mg  0.5 mg Oral TID Jenne Campus, MD   0.5 mg at 06/16/16 0826  . DULoxetine (CYMBALTA) DR capsule 20 mg  20 mg Oral Daily Jenne Campus, MD   20 mg at 06/16/16 0826  . FLUoxetine (PROZAC) capsule 20 mg  20 mg Oral Daily Jenne Campus, MD   20 mg at 06/16/16 0826  . hydrOXYzine (ATARAX/VISTARIL) tablet 25 mg  25 mg Oral Q6H PRN  Rozetta Nunnery, NP   25 mg at 06/15/16 2355  . levothyroxine (SYNTHROID, LEVOTHROID) tablet 75 mcg  75 mcg Oral QAC breakfast Jenne Campus, MD   75 mcg at 06/16/16 0616  . magnesium citrate solution 1 Bottle  1 Bottle Oral Once PRN Myer Peer Cobos, MD      . magnesium hydroxide (MILK OF MAGNESIA) suspension 30 mL  30 mL Oral Daily PRN Rozetta Nunnery, NP   30 mL at 06/16/16 0841  . mirtazapine (REMERON) tablet 7.5 mg  7.5 mg Oral QHS Myer Peer Cobos, MD   7.5 mg at 06/15/16 2059  . nicotine (NICODERM CQ - dosed in mg/24 hours) patch 21 mg  21 mg Transdermal Daily Jenne Campus, MD   21 mg at 06/16/16 0826  . tamsulosin (FLOMAX) capsule 0.4 mg  0.4 mg Oral QHS Jenne Campus, MD   0.4 mg at 06/15/16 2059    Lab Results: No results found  for this or any previous visit (from the past 48 hour(s)).  Blood Alcohol level:  Lab Results  Component Value Date   ETH <5 95/63/8756    Metabolic Disorder Labs: No results found for: HGBA1C, MPG No results found for: PROLACTIN No results found for: CHOL, TRIG, HDL, CHOLHDL, VLDL, LDLCALC  Physical Findings: AIMS: Facial and Oral Movements Muscles of Facial Expression: None, normal Lips and Perioral Area: None, normal Jaw: None, normal Tongue: None, normal,Extremity Movements Upper (arms, wrists, hands, fingers): None, normal Lower (legs, knees, ankles, toes): None, normal, Trunk Movements Neck, shoulders, hips: None, normal, Overall Severity Severity of abnormal movements (highest score from questions above): None, normal Incapacitation due to abnormal movements: None, normal Patient's awareness of abnormal movements (rate only patient's report): No Awareness, Dental Status Current problems with teeth and/or dentures?: No Does patient usually wear dentures?: No  CIWA:    COWS:     Musculoskeletal: Strength & Muscle Tone: within normal limits Gait & Station: normal Patient leans: N/A  Psychiatric Specialty Exam: Physical Exam  ROS reports improving dizziness,  no chest pain or shortness of breath , no vomiting   Blood pressure 96/67, pulse (!) 102, temperature 97.8 F (36.6 C), temperature source Oral, resp. rate 18, height '6\' 3"'  (1.905 m), weight 196 lb (88.9 kg).Body mass index is 24.5 kg/m.  General Appearance: Fairly Groomed  Eye Contact:  Good  Speech:  Normal Rate  Volume:  Normal  Mood:  Remains depressed , states he is starting to feel slightly better   Affect:  Constricted, but does smile briefly at times   Thought Process:  Linear  Orientation:  Other:  alert and attentive   Thought Content:  no hallucinations, no delusions, not internally preoccupied   Suicidal Thoughts:  No- at this time denies any active suicidal ideations or self injurious ideations,  and contracts for safety on unit   Homicidal Thoughts:  No no homicidal or violent ideations   Memory:  recent and remote grossly intact   Judgement:  Fair  Insight:  Fair  Psychomotor Activity:   More visible on unit today  Concentration:  Concentration: Good and Attention Span: Good  Recall:  Good  Fund of Knowledge:  Good  Language:  Good  Akathisia:  Negative  Handed:  Right  AIMS (if indicated):     Assets:  Desire for Improvement Resilience  ADL's:  Intact  Cognition:  WNL  Sleep:  Number of Hours: 6   Assessment - patient reports ongoing significant depression,  sadness, and anxiety. States he is feeling slightly, modestly better than on admission and expresses hope that medications are starting to help. Denies active suicidal ideations at this time and contracts for safety on unit. Also endorses anxiety symptoms, and describes history of social anxiety type symptoms. Tolerating Cymbalta taper well thus far- dizziness he had reported yesterday has subsided - gait steady . We discussed Neurontin trial to help address anxiety, and reported history of lower back pain. Side effects reviewed .   Treatment Plan Summary: Daily contact with patient to assess and evaluate symptoms and progress in treatment, Medication management, Plan inpatient admission  and medications as below  Encourage group , milieu participation to work on coping skills and symptom reduction  Continue Klonopin 0.5 mgrs TID for anxiety, taper gradually. Continue Prozac 20 mgrs QDAY for depression, anxiety Start Neurontin 100 mgrs TID for anxiety, pain.  D/C Cymbalta. Continue Vistaril 25 mgrs Q 6 hours PRN for anxiety as needed Continue Remeron 7.5 mgrs QHS for depression and insomnia  Treatment team working on disposition planning options  Neita Garnet, MD 06/16/2016, 2:20 PMPatient ID: Christopher Giles, male   DOB: [birth year], 50 y.o.   MRN: [id number]

## 2016-06-16 NOTE — Progress Notes (Signed)
Recreation Therapy Notes  Date: 06/16/16 Time: 0930 Location: [address] Group Room  Group Topic: Stress Management  Goal Area(s) Addresses:  Patient will verbalize importance of using healthy stress management.  Patient will identify positive emotions associated with healthy stress management.   Intervention: Stress Management  Activity :  Progressive Muscle Relaxation.  LRT introduced the technique of progressive muscle relaxation to the group.  LRT read script to engage patients in the technique.  Patients were to follow along as LRT read a script to participate in activity.  Education:  Stress Management, Discharge Planning.   Education Outcome: Acknowledges edcuation/In group clarification offered/Needs additional education  Clinical Observations/Feedback: Pt did not attend group.     Graziella Connery, LRT/CTRS         Christopher Giles A 06/16/2016 11:48 AM 

## 2016-06-16 NOTE — Progress Notes (Signed)
Christopher Giles. Christopher Giles has been up and visible in milieu this evening, did attend and participate in evening group activity. [name] has endorsed on-going depression and anxiety and reports that he is still feeling overwhelmingly anxious and does not report and improvement yet. [name] also spoke about difficulties with sleep and how he has been feeling dizzy the past couple of mornings and spoke about how trazodone may have had that affect on him. [name] did receive bedtime medications without incident. A. Support and encouragement provided. R. Safety maintained, will continue to monitor.

## 2016-06-16 NOTE — [facility] Group Notes (Signed)
[facility] LCSW Group Therapy 06/16/2016 1:15 PM  Type of Therapy: Group Therapy- Emotion Regulation  Participation Level: Reserved  Participation Quality:  Attentive  Affect: Flat  Cognitive: Alert and Oriented   Insight:  Unable to assess  Engagement in Therapy: Developing/Improving and Engaged   Modes of Intervention: Clarification, Confrontation, Discussion, Education, Exploration, Limit-setting, Orientation, Problem-solving, Rapport Building, Dance movement psychotherapisteality Testing, Socialization and Support  Summary of Progress/Problems: The topic for group today was emotional regulation. This group focused on both positive and negative emotion identification and allowed group members to process ways to identify feelings, regulate negative emotions, and find healthy ways to manage internal/external emotions. Group members were asked to reflect on a time when their reaction to an emotion led to a negative outcome and explored how alternative responses using emotion regulation would have benefited them. Group members were also asked to discuss a time when emotion regulation was utilized when a negative emotion was experienced. Pt was attentive to group discussion but did not participate verbally.   Christopher CordialLauren Giles, LCSWA 06/16/2016 3:40 PM

## 2016-06-16 NOTE — Progress Notes (Signed)
Patient ID: Christopher BanningJames C Weatherwax, male   DOB: May 19, 1966, 50 y.o.   MRN: [id number]  DAR: Pt. Denies SI/HI and A/V Hallucinations. He reports sleep is fair, appetite is fair, energy level is low, and concentration is poor. He reports a lot of anxiety and depression. He rates anxiety, depression, and hopelessness 10/10. He reports having difficulty attending groups due to "social anxiety" and therefore he reports he will spend most of his time in bed and to himself until his anxiety gets better. Patient does not report any pain at this time. Support and encouragement provided to the patient. Patient reports constipation, and received PRN dose of Milk of Magnesia. Scheduled medications administered to patient per physician's orders. Q15 minute checks are maintained for safety.

## 2016-06-17 NOTE — [facility] Group Notes (Signed)

## 2016-06-17 NOTE — [facility] Group Notes (Signed)
The focus of this group is to educate the patient on the purpose and policies of crisis stabilization and provide a format to answer questions about their admission.  The group details unit policies and expectations of patients while admitted.  Patient attended 0900 nurse education orientation group this morning.  Patient actively participated and had appropriate affect.  Patient was alert.  Patient had appropriate insight and appropriate engagement.  Today patient will work on 3 goals for discharge.  Patient will discuss medications with MD today, stated he is feeling happy.

## 2016-06-17 NOTE — Progress Notes (Signed)
[facility] Progress Note  06/17/2016 5:18 PM [name]  MRN:  [id number] Subjective:  Patient reports ongoing depression, anxiety, but states he does feel partially better than on admission. In particular, identifies no longer feeling hopeless. States " it's like I have hope now that I am going to get better". Denies medication side effects at this time, except for some dry mouth. Denies suicidal ideations .   Objective: I have discussed case with treatment team and have met with patient . Remains depressed, sad, vaguely anxious, but reports subjective improvement compared to how he felt on admission. He has been visible on unit, and has been going to some groups, participating more in milieu. As noted, reports feeling less hopeless, less despondent at this time. Denies any active suicidal or self injurious ideations at this time, denies psychotic symptoms. Describes dry mouth, otherwise tolerating medications well . No  disruptive or agitated behaviors on unit .  Principal Problem: Major depression, recurrent ([place]) Diagnosis:   Patient Active Problem List   Diagnosis Date Noted  . Major depression, recurrent ([place]) [F33.9] 06/13/2016   Total Time spent with patient: 20 minutes    Past Medical History:  Past Medical History:  Diagnosis Date  . Anxiety   . Depression   . Substance abuse   . Suicide attempt    x 6 per pt  . Thyroid disease    hypo    Past Surgical History:  Procedure Laterality Date  . punctured lung     Family History:  Family History  Problem Relation Age of Onset  . Hyperlipidemia Mother   . Cancer Mother   . Cancer Father   . Diabetes Maternal Grandmother     Social History:  History  Alcohol Use No     History  Drug Use No    Social History   Social History  . Marital status: Single    Spouse name: N/A  . Number of children: N/A  . Years of education: N/A   Social History Main Topics  . Smoking status: Current Every Day Smoker     Packs/day: 1.00    Types: Cigarettes  . Smokeless tobacco: Never Used  . Alcohol use No  . Drug use: No  . Sexual activity: Not Asked   Other Topics Concern  . None   Social History Narrative  . None   Additional Social History:   Sleep: gradually improving   Appetite:  Fair  Current Medications: Current Facility-Administered Medications  Medication Dose Route Frequency Provider Last Rate Last Dose  . acetaminophen (TYLENOL) tablet 650 mg  650 mg Oral Q6H PRN Rozetta Nunnery, NP      . alum & mag hydroxide-simeth (MAALOX/MYLANTA) 200-200-20 MG/5ML suspension 30 mL  30 mL Oral Q4H PRN Rozetta Nunnery, NP      . clonazePAM (KLONOPIN) tablet 0.5 mg  0.5 mg Oral TID Jenne Campus, MD   0.5 mg at 06/17/16 1451  . FLUoxetine (PROZAC) capsule 20 mg  20 mg Oral Daily Jenne Campus, MD   20 mg at 06/17/16 0913  . gabapentin (NEURONTIN) capsule 100 mg  100 mg Oral TID Jenne Campus, MD   100 mg at 06/17/16 1451  . hydrOXYzine (ATARAX/VISTARIL) tablet 25 mg  25 mg Oral Q6H PRN Rozetta Nunnery, NP   25 mg at 06/15/16 2355  . levothyroxine (SYNTHROID, LEVOTHROID) tablet 75 mcg  75 mcg Oral QAC breakfast Jenne Campus, MD   75 mcg  at 06/17/16 0552  . magnesium citrate solution 1 Bottle  1 Bottle Oral Once PRN Myer Peer Cobos, MD      . magnesium hydroxide (MILK OF MAGNESIA) suspension 30 mL  30 mL Oral Daily PRN Rozetta Nunnery, NP   30 mL at 06/16/16 0841  . mirtazapine (REMERON) tablet 7.5 mg  7.5 mg Oral QHS Myer Peer Cobos, MD   7.5 mg at 06/16/16 2056  . nicotine (NICODERM CQ - dosed in mg/24 hours) patch 21 mg  21 mg Transdermal Daily Jenne Campus, MD   21 mg at 06/17/16 0912  . tamsulosin (FLOMAX) capsule 0.4 mg  0.4 mg Oral QHS Jenne Campus, MD   0.4 mg at 06/16/16 2116    Lab Results: No results found for this or any previous visit (from the past 48 hour(s)).  Blood Alcohol level:  Lab Results  Component Value Date   ETH <5 76/19/5093    Metabolic Disorder  Labs: No results found for: HGBA1C, MPG No results found for: PROLACTIN No results found for: CHOL, TRIG, HDL, CHOLHDL, VLDL, LDLCALC  Physical Findings: AIMS: Facial and Oral Movements Muscles of Facial Expression: None, normal Lips and Perioral Area: None, normal Jaw: None, normal Tongue: None, normal,Extremity Movements Upper (arms, wrists, hands, fingers): None, normal Lower (legs, knees, ankles, toes): None, normal, Trunk Movements Neck, shoulders, hips: None, normal, Overall Severity Severity of abnormal movements (highest score from questions above): None, normal Incapacitation due to abnormal movements: None, normal Patient's awareness of abnormal movements (rate only patient's report): No Awareness, Dental Status Current problems with teeth and/or dentures?: No Does patient usually wear dentures?: No  CIWA:    COWS:     Musculoskeletal: Strength & Muscle Tone: within normal limits Gait & Station: normal Patient leans: N/A  Psychiatric Specialty Exam: Physical Exam  ROS reports improving dizziness,  no chest pain or shortness of breath , no vomiting   Blood pressure 101/74, pulse 80, temperature 98 F (36.7 C), temperature source Oral, resp. rate 18, height _0  (1.905 m), weight 196 lb (88.9 kg).Body mass index is 24.5 kg/m.  General Appearance: Fairly Groomed  Eye Contact:  Good  Speech:  Normal Rate  Volume:  Normal  Mood:  Remains depressed, but identifies some improvement compared to admission   Affect:  Remains constricted   Thought Process:  Linear  Orientation:  Other:  alert and attentive   Thought Content:  no hallucinations, no delusions, not internally preoccupied   Suicidal Thoughts:  No- at this time denies any active suicidal ideations or self injurious ideations, and contracts for safety on unit   Homicidal Thoughts:  No no homicidal or violent ideations   Memory:  recent and remote grossly intact   Judgement:  improving   Insight:  Improving    Psychomotor Activity:   More visible on unit today  Concentration:  Concentration: Good and Attention Span: Good  Recall:  Good  Fund of Knowledge:  Good  Language:  Good  Akathisia:  Negative  Handed:  Right  AIMS (if indicated):     Assets:  Giles for Improvement Resilience  ADL's:  Intact  Cognition:  WNL  Sleep:  Number of Hours: 5.75   Assessment - patient is gradually improving compared to admission, but remains depressed, constricted in affect, vaguely anxious, and continues to endorse neuro -vegetative symptoms of depression. Does report feeling somewhat more hopeful and is optimistic that mediations are starting to help. More visible on unit .  At this time reports improvement in dizziness, reports some lingering dry mouth but otherwise tolerating current medications well .   Treatment Plan Summary: Daily contact with patient to assess and evaluate symptoms and progress in treatment, Medication management, Plan inpatient admission  and medications as below  Encourage group , milieu participation to work on coping skills and symptom reduction  Continue Klonopin 0.5 mgrs TID for anxiety, taper gradually. Continue Prozac 20 mgrs QDAY for depression, anxiety Continue Neurontin 100 mgrs TID for anxiety, pain.  Continue Vistaril 25 mgrs Q 6 hours PRN for anxiety as needed Continue Remeron 7.5 mgrs QHS for depression and insomnia  Treatment team working on disposition planning options  Neita Garnet, MD 06/17/2016, 5:18 PMPatient ID: Christopher Giles, male   DOB: [birth year], 50 y.o.   MRN: [id number]

## 2016-06-17 NOTE — Progress Notes (Signed)
Christopher Giles. Christopher Giles had been up and visible in milieu this evening, did attend and participate in evening group activity. Christopher Giles spoke about how he is still feeling depressed and anxious but reports feeling somewhat better now than when first arrived. Christopher Giles received all medications without incident and did not verbalize any complaints of pain. A. Support provided, medication education given. Christopher Giles verbalized understanding, safety maintained.

## 2016-06-17 NOTE — Progress Notes (Signed)
C/O ongoing dizziness, nursed asked more questions and found the patient has been experiencing more equilibrium problems, feels like the room is moving/spinning for past week.  Denied upper respiratory symptoms.. C/O confusion while he was eating today, had difficulty identifying what he was eating.  Orthostatic vitals normal (see flow).  States confusion and depression has gotten progressively worse over last 3 years.  Attending contacted, received order for MRI to be scheduled tomorrow.  Patient aware and grateful.  Creat normal, patient denies any metal or magnetic implants. Denies claustrophobia.

## 2016-06-17 NOTE — [facility] Group Notes (Signed)
[facility] 06/17/2016 1:15pm  Type of Therapy: Mental Health Association Presentation  Participation Level: Active  Participation Quality: Attentive  Affect: Appropriate  Cognitive: Oriented  Insight: Developing/Improving  Engagement in Therapy: Engaged  Modes of Intervention: Discussion, Education and Socialization  Summary of Progress/Problems: Mental Health Association (MHA) Speaker came to talk about his personal journey with substance abuse and addiction. The pt processed ways by which to relate to the speaker. MHA speaker provided handouts and educational information pertaining to groups and services offered by the [facility]. Pt was engaged in speaker's presentation and was receptive to resources provided.    Chad CordialLauren Carter, LCSWA 06/17/2016 1:57 PM

## 2016-06-17 NOTE — Progress Notes (Signed)
Nursing Note 06/17/2016 [id number]  Data Reports sleeping fair with PRN sleep med.  Rates depression 9/10, hopelessness 9/10, and anxiety 9/10. Affect depressed and blunted.  Denies HI, SI, AVH.  Attending groups, vitals stable.  Denies physical complaints.  Reports ongoing difficulty concentrating.  Attending groups, spends free time in day area, interacting with peers.  Action Spoke with patient 1:1, nurse offered support to patient throughout shift.  Continues to be monitored on 15 minute checks for safety.  Response Remains safe and appropriate on unit.

## 2016-06-18 MED ORDER — GABAPENTIN 100 MG PO CAPS
200.0000 mg | ORAL_CAPSULE | Freq: Three times a day (TID) | ORAL | Status: DC
  Administered 2016-06-18 – 2016-06-27 (×27): 200 mg via ORAL
  Filled 2016-06-18 (×35): qty 2

## 2016-06-18 MED ORDER — CLONAZEPAM 0.5 MG PO TABS
0.5000 mg | ORAL_TABLET | Freq: Two times a day (BID) | ORAL | Status: DC
  Administered 2016-06-19 – 2016-07-06 (×35): 0.5 mg via ORAL
  Filled 2016-06-18 (×35): qty 1

## 2016-06-18 MED ORDER — MIRTAZAPINE 15 MG PO TABS
15.0000 mg | ORAL_TABLET | Freq: Every day | ORAL | Status: DC
  Administered 2016-06-18 – 2016-06-30 (×13): 15 mg via ORAL
  Filled 2016-06-18 (×15): qty 1

## 2016-06-18 NOTE — Progress Notes (Signed)
D [name] is seen OOB UAL on the 400 hall today. He is sleepy disheveled and says " what time is it? Have I been sleeping all day"??? A HE takes his emds as planned and he completed his daily assessment and on it he wirte he deneid SI today and he rated his depression, hopelessness and anxeity " 06/21/09", reopsectively. R POC includes getting MRI to f/u on pt's dizziness. Safety in pal;ce.

## 2016-06-18 NOTE — Progress Notes (Signed)
Recreation Therapy Notes  Date: 06/18/16 Time: 0930 Location: 300 Hall Dayroom  Group Topic: Stress Management  Goal Area(s) Addresses:  Patient will verbalize importance of using healthy stress management.  Patient will identify positive emotions associated with healthy stress management.   Intervention: Stress Management  Activity :  [place].  LRT introduced the technique of guided imagery.  LRT read script allowing patients to participate and engage in the technique.  Patients were to follow along as LRT read script.  Education:  Stress Management, Discharge Planning.   Education Outcome: Acknowledges edcuation/In group clarification offered/Needs additional education  Clinical Observations/Feedback: Pt did not attend group.     Christopher Giles, LRT/CTRS         Christopher Giles A 06/18/2016 11:59 AM 

## 2016-06-18 NOTE — Tx Team (Signed)
Interdisciplinary Treatment and Diagnostic Plan Update  06/18/2016 Time of Session: 10 AM Christopher BanningJames C Giles MRN: [id number]  Principal Diagnosis: Major depression, recurrent (HCC)  Secondary Diagnoses: Principal Problem:   Major depression, recurrent (HCC)   Current Medications:  Current Facility-Administered Medications  Medication Dose Route Frequency Provider Last Rate Last Dose  . acetaminophen (TYLENOL) tablet 650 mg  650 mg Oral Q6H PRN Jackelyn PolingJason A Berry, NP      . alum & mag hydroxide-simeth (MAALOX/MYLANTA) 200-200-20 MG/5ML suspension 30 mL  30 mL Oral Q4H PRN Jackelyn PolingJason A Berry, NP      . clonazePAM (KLONOPIN) tablet 0.5 mg  0.5 mg Oral TID Craige CottaFernando A Cobos, MD   0.5 mg at 06/17/16 2200  . FLUoxetine (PROZAC) capsule 20 mg  20 mg Oral Daily Craige CottaFernando A Cobos, MD   20 mg at 06/17/16 0913  . gabapentin (NEURONTIN) capsule 100 mg  100 mg Oral TID Craige CottaFernando A Cobos, MD   100 mg at 06/17/16 2000  . hydrOXYzine (ATARAX/VISTARIL) tablet 25 mg  25 mg Oral Q6H PRN Jackelyn PolingJason A Berry, NP   25 mg at 06/15/16 2355  . levothyroxine (SYNTHROID, LEVOTHROID) tablet 75 mcg  75 mcg Oral QAC breakfast Craige CottaFernando A Cobos, MD   75 mcg at 06/18/16 0620  . magnesium citrate solution 1 Bottle  1 Bottle Oral Once PRN Rockey SituFernando A Cobos, MD      . magnesium hydroxide (MILK OF MAGNESIA) suspension 30 mL  30 mL Oral Daily PRN Jackelyn PolingJason A Berry, NP   30 mL at 06/16/16 0841  . mirtazapine (REMERON) tablet 7.5 mg  7.5 mg Oral QHS Rockey SituFernando A Cobos, MD   7.5 mg at 06/17/16 2221  . nicotine (NICODERM CQ - dosed in mg/24 hours) patch 21 mg  21 mg Transdermal Daily Craige CottaFernando A Cobos, MD   21 mg at 06/17/16 0912  . tamsulosin (FLOMAX) capsule 0.4 mg  0.4 mg Oral QHS Craige CottaFernando A Cobos, MD   0.4 mg at 06/17/16 2222   PTA Medications: Prescriptions Prior to Admission  Medication Sig Dispense Refill Last Dose  . bismuth subsalicylate (PEPTO BISMOL) 262 MG chewable tablet Chew 524 mg by mouth as needed for indigestion or diarrhea or loose stools.    unk  . clonazePAM (KLONOPIN) 1 MG tablet Take 1 mg by mouth 3 (three) times daily as needed for anxiety.   06/12/2016 at Unknown time  . DULoxetine (CYMBALTA) 60 MG capsule Take 60 mg by mouth daily.   06/12/2016 at Unknown time  . ibuprofen (ADVIL,MOTRIN) 200 MG tablet Take 400 mg by mouth every 6 (six) hours as needed for moderate pain.   unk  . levothyroxine (SYNTHROID, LEVOTHROID) 50 MCG tablet Take 1 tablet (50 mcg total) by mouth daily before breakfast. (Patient taking differently: Take 75 mcg by mouth daily before breakfast. ) 30 tablet 1 06/12/2016 at Unknown time  . Menthol, Topical Analgesic, (BIOFREEZE ROLL-ON COLORLESS) 4 % GEL Apply 1 application topically daily as needed (pain).   unk  . Menthol-Methyl Salicylate (ICY HOT BALM EXTRA STRENGTH) 7.6-29 % OINT Apply 1 application topically daily as needed (pain).   unk  . naproxen sodium (ANAPROX) 220 MG tablet Take 220 mg by mouth 2 (two) times daily as needed (pain).   unk    Patient Stressors: Medication change or noncompliance Occupational concerns  Patient Strengths: Average or above average intelligence Motivation for treatment/growth Physical Health Supportive family/friends  Treatment Modalities: Medication Management, Group therapy, Case management,  1 to 1 session  with clinician, Psychoeducation, Recreational therapy.   Physician Treatment Plan for Primary Diagnosis: Major depression, recurrent (HCC) Long Term Goal(s): Improvement in symptoms so as ready for discharge Improvement in symptoms so as ready for discharge   Short Term Goals: Ability to identify changes in lifestyle to reduce recurrence of condition will improve Ability to verbalize feelings will improve Ability to identify and develop effective coping behaviors will improve Compliance with prescribed medications will improve Ability to identify changes in lifestyle to reduce recurrence of condition will improve Ability to demonstrate self-control will  improve Ability to maintain clinical measurements within normal limits will improve Ability to identify triggers associated with substance abuse/mental health issues will improve  Medication Management: Evaluate patient's response, side effects, and tolerance of medication regimen.  Therapeutic Interventions: 1 to 1 sessions, Unit Group sessions and Medication administration.  Evaluation of Outcomes: Progressing  Physician Treatment Plan for Secondary Diagnosis: Principal Problem:   Major depression, recurrent (HCC)  Long Term Goal(s): Improvement in symptoms so as ready for discharge Improvement in symptoms so as ready for discharge   Short Term Goals: Ability to identify changes in lifestyle to reduce recurrence of condition will improve Ability to verbalize feelings will improve Ability to identify and develop effective coping behaviors will improve Compliance with prescribed medications will improve Ability to identify changes in lifestyle to reduce recurrence of condition will improve Ability to demonstrate self-control will improve Ability to maintain clinical measurements within normal limits will improve Ability to identify triggers associated with substance abuse/mental health issues will improve     Medication Management: Evaluate patient's response, side effects, and tolerance of medication regimen.  Therapeutic Interventions: 1 to 1 sessions, Unit Group sessions and Medication administration.  Evaluation of Outcomes: Progressing   RN Treatment Plan for Primary Diagnosis: Major depression, recurrent (HCC) Long Term Goal(s): Knowledge of disease and therapeutic regimen to maintain health will improve  Short Term Goals: Ability to disclose and discuss suicidal ideas and Ability to identify and develop effective coping behaviors will improve  Medication Management: RN will administer medications as ordered by provider, will assess and evaluate patient's response and  provide education to patient for prescribed medication. RN will report any adverse and/or side effects to prescribing provider.  Therapeutic Interventions: 1 on 1 counseling sessions, Psychoeducation, Medication administration, Evaluate responses to treatment, Monitor vital signs and CBGs as ordered, Perform/monitor CIWA, COWS, AIMS and Fall Risk screenings as ordered, Perform wound care treatments as ordered.  Evaluation of Outcomes: Progressing   LCSW Treatment Plan for Primary Diagnosis: Major depression, recurrent (HCC) Long Term Goal(s): Safe transition to appropriate next level of care at discharge, Engage patient in therapeutic group addressing interpersonal concerns.  Short Term Goals: Engage patient in aftercare planning with referrals and resources, Increase social support and Increase ability to appropriately verbalize feelings  Therapeutic Interventions: Assess for all discharge needs, 1 to 1 time with Social worker, Explore available resources and support systems, Assess for adequacy in community support network, Educate family and significant other(s) on suicide prevention, Complete Psychosocial Assessment, Interpersonal group therapy.  Evaluation of Outcomes: Progressing   Progress in Treatment: Attending groups: Yes. Participating in groups: Progressing Taking medication as prescribed: Yes. Toleration medication: Yes. Family/Significant other contact made: No, CSW attempting to make contact with sister Patient understands diagnosis: Yes. Discussing patient identified problems/goals with staff: Yes. Medical problems stabilized or resolved: Yes. Denies suicidal/homicidal ideation: No, Pt endorses passive SI Issues/concerns per patient self-inventory: No. Other: na  New problem(s) identified: No, Describe:  none at this time  New Short Term/Long Term Goal(s): None identified at this time.   Discharge Plan or Barriers: limited support network, unstable  housing  06/18/16: Pt continues to be unsure if he can return to his sister's house; will follow-up with outpatient rescources  Reason for Continuation of Hospitalization: Depression Medication stabilization Suicidal ideation  Estimated Length of Stay: 3 - 5 days  Attendees: Patient: 06/18/2016 10:56 AM  Physician: Sallyanne Havers MD 06/18/2016 10:56 AM  Nursing: Johnnye Sima 06/18/2016 10:56 AM  RN Care Manager: Sondra Barges RN 06/18/2016 10:56 AM  Social Worker: Chad Cordial LCSW 06/18/2016 10:56 AM  Recreational Therapist:  06/18/2016 10:56 AM  Other:  06/18/2016 10:56 AM  Other:  06/18/2016 10:56 AM  Other: 06/18/2016 10:56 AM    Scribe for Treatment Team: Elaina Hoops, LCSW 06/18/2016 10:56 AM

## 2016-06-18 NOTE — [facility] Group Notes (Signed)
[facility] LCSW Group Therapy 06/18/2016 1:15pm  Type of Therapy: Group Therapy- Feelings Around Relapse and Recovery  Pt was present at the beginning of the group but was called out by the MD and did not return.  Christopher CordialLauren Carter, Theresia MajorsLCSWA [phone] 06/18/2016 2:25 PM

## 2016-06-18 NOTE — Progress Notes (Signed)
D: Pt denies SI/HI/AVH. Pt is pleasant and cooperative. Pt goal for today is to work on getting medication straight. A: Pt was offered support and encouragement. Pt was given scheduled medications. Pt was encourage to attend groups. Q 15 minute checks were done for safety.  R:Pt attends groups and interacts well with peers and staff. Pt is taking medication. Pt has no complaints.Pt receptive to treatment and safety maintained on unit.

## 2016-06-18 NOTE — Progress Notes (Signed)
[facility] Group Notes:  (Nursing/MHT/Case Management/Adjunct)  Date:  06/18/2016  Time:  11:11 PM  Type of Therapy:  Psychoeducational Skills  Participation Level:  Active  Participation Quality:  Appropriate  Affect:  Depressed  Cognitive:  Appropriate  Insight:  Good  Engagement in Group:  Developing/Improving  Modes of Intervention:  Education  Summary of Progress/Problems: The patient initially stated that he had a good day and then he spoke at great length about how he struggled throughout the day. He states that his medication was adjusted by the doctor, yet, his anxiety is very bothersome to him. He also states that he is dealing with "social anxiety" as well. In terms of the theme for the day, his relapse prevention will include attending more meetings.   Melessa Cowell S 06/18/2016, 11:11 PM

## 2016-06-18 NOTE — Progress Notes (Signed)
[facility] MD Progress Note  06/18/2016 2:46 PM Christopher Giles Giles  MRN:  [id number] Subjective:  Patient reports some improvement but continues to report depression and significant anxiety. He reports a history of intermittent dizziness, possible vertigo, and a subjective sense of confusion , cognitive slowing, that also waxes and wanes at times.  At this time denies medication side effects, and although reports only modest improvement compared to admission, does express hope that medications are helping .    Objective: I have discussed case with treatment team and have met with patient . Patient remains depressed, anxious, ruminative, and today expressing somatic concerns as above . He particularly worries about having the subjective feeling that he is often " confused" or slowed down cognitively . As per staff, he has continued to present with blunted affect, anxiety, and has rated depression as 9/10. He has denied any active SI, and at this time denies any thoughts of suicide or of hurting self. No psychotic symptoms endorsed . Of note, did a Mini-mental Exam today - scored perfectly 30/30 and at this time there is no presentation of confusion or any altered mental status . Patient partially reassured by this.  Presents calm, cooperative, no disruptive or agitated behaviors on unit, going to some groups. Denies any active suicidal or self injurious ideations at this time, denies psychotic symptoms. Describes dry mouth, otherwise tolerating medications well . No  disruptive or agitated behaviors on unit .  Principal Problem: Major depression, recurrent ([place]) Diagnosis:   Patient Active Problem List   Diagnosis Date Noted  . Major depression, recurrent ([place]) [F33.9] 06/13/2016   Total Time spent with patient: 20 minutes    Past Medical History:  Past Medical History:  Diagnosis Date  . Anxiety   . Depression   . Substance abuse   . Suicide attempt    x 6 per pt  . Thyroid disease    hypo     Past Surgical History:  Procedure Laterality Date  . punctured lung     Family History:  Family History  Problem Relation Age of Onset  . Hyperlipidemia Mother   . Cancer Mother   . Cancer Father   . Diabetes Maternal Grandmother     Social History:  History  Alcohol Use No     History  Drug Use No    Social History   Social History  . Marital status: Single    Spouse name: N/A  . Number of children: N/A  . Years of education: N/A   Social History Main Topics  . Smoking status: Current Every Day Smoker    Packs/day: 1.00    Types: Cigarettes  . Smokeless tobacco: Never Used  . Alcohol use No  . Drug use: No  . Sexual activity: Not Asked   Other Topics Concern  . None   Social History Narrative  . None   Additional Social History:   Sleep: gradually improving   Appetite:  Fair  Current Medications: Current Facility-Administered Medications  Medication Dose Route Frequency Provider Last Rate Last Dose  . acetaminophen (TYLENOL) tablet 650 mg  650 mg Oral Q6H PRN Rozetta Nunnery, NP      . alum & mag hydroxide-simeth (MAALOX/MYLANTA) 200-200-20 MG/5ML suspension 30 mL  30 mL Oral Q4H PRN Rozetta Nunnery, NP      . Derrill Memo ON 06/19/2016] clonazePAM (KLONOPIN) tablet 0.5 mg  0.5 mg Oral BID Jenne Campus, MD      . FLUoxetine (PROZAC) capsule  20 mg  20 mg Oral Daily Jenne Campus, MD   20 mg at 06/18/16 1300  . gabapentin (NEURONTIN) capsule 200 mg  200 mg Oral TID Jenne Campus, MD      . hydrOXYzine (ATARAX/VISTARIL) tablet 25 mg  25 mg Oral Q6H PRN Rozetta Nunnery, NP   25 mg at 06/15/16 2355  . levothyroxine (SYNTHROID, LEVOTHROID) tablet 75 mcg  75 mcg Oral QAC breakfast Jenne Campus, MD   75 mcg at 06/18/16 0620  . magnesium citrate solution 1 Bottle  1 Bottle Oral Once PRN Myer Peer Cobos, MD      . magnesium hydroxide (MILK OF MAGNESIA) suspension 30 mL  30 mL Oral Daily PRN Rozetta Nunnery, NP   30 mL at 06/16/16 0841  . mirtazapine (REMERON)  tablet 15 mg  15 mg Oral QHS Fernando A Cobos, MD      . nicotine (NICODERM CQ - dosed in mg/24 hours) patch 21 mg  21 mg Transdermal Daily Jenne Campus, MD   21 mg at 06/18/16 1302  . tamsulosin (FLOMAX) capsule 0.4 mg  0.4 mg Oral QHS Jenne Campus, MD   0.4 mg at 06/17/16 2222    Lab Results: No results found for this or any previous visit (from the past 48 hour(s)).  Blood Alcohol level:  Lab Results  Component Value Date   ETH <5 83/41/9622    Metabolic Disorder Labs: No results found for: HGBA1C, MPG No results found for: PROLACTIN No results found for: CHOL, TRIG, HDL, CHOLHDL, VLDL, LDLCALC  Physical Findings: AIMS: Facial and Oral Movements Muscles of Facial Expression: None, normal Lips and Perioral Area: None, normal Jaw: None, normal Tongue: None, normal,Extremity Movements Upper (arms, wrists, hands, fingers): None, normal Lower (legs, knees, ankles, toes): None, normal, Trunk Movements Neck, shoulders, hips: None, normal, Overall Severity Severity of abnormal movements (highest score from questions above): None, normal Incapacitation due to abnormal movements: None, normal Patient's awareness of abnormal movements (rate only patient's report): No Awareness, Dental Status Current problems with teeth and/or dentures?: No Does patient usually wear dentures?: No  CIWA:    COWS:     Musculoskeletal: Strength & Muscle Tone: within normal limits Gait & Station: normal Patient leans: N/A  Psychiatric Specialty Exam: Physical Exam  ROS dizziness,  no chest pain or shortness of breath , no vomiting   Blood pressure 104/73, pulse 90, temperature 98 F (36.7 C), temperature source Oral, resp. rate 17, height '6\' 3"'  (1.905 m), weight 196 lb (88.9 kg).Body mass index is 24.5 kg/m.  General Appearance: Fairly Groomed  Eye Contact:  Good  Speech:  Normal Rate  Volume:  Normal  Mood:  Depressed, anxious   Affect:  Remains constricted   Thought Process:  Linear   Orientation:  Other:  alert and attentive   Thought Content:  no hallucinations, no delusions, not internally preoccupied  today more focused on somatic concerns, particularly concerns about his memory and cognitive status   Suicidal Thoughts:  No- at this time denies any active suicidal ideations or self injurious ideations, and contracts for safety on unit   Homicidal Thoughts:  No no homicidal or violent ideations   Memory:  recent and remote grossly intact   Judgement:  improving   Insight:  Improving   Psychomotor Activity:   More visible on unit today  Concentration:  Concentration: Good and Attention Span: Good  Recall:  Good  Fund of Knowledge:  Good  Language:  Good  Akathisia:  Negative  Handed:  Right  AIMS (if indicated):     Assets:  Desire for Improvement Resilience  ADL's:  Intact  Cognition:  WNL  Sleep:  Number of Hours: 5.5   Assessment - patient has improved partially compared to admission, but continues to present sad, depressed,  significantly anxious, and today presents with ruminations about his cognitive status, as he reports a subjective sense of some cognitive decline and subjective feeling of confusion over recent months. At this time, however, he is alert, attentive, 0x3 , and scored 30/30 on MMSE. Denies suicidal ideations, denies medication side effects.  Treatment Plan Summary: Daily contact with patient to assess and evaluate symptoms and progress in treatment, Medication management, Plan inpatient admission  and medications as below  Encourage group , milieu participation to work on coping skills and symptom reduction  Decrease  Klonopin to  0.5 mgrs BID for anxiety Continue Prozac 20 mgrs QDAY for depression, anxiety Increase Neurontin to 200 mgrs TID for ongoing anxiety, pain.  Continue Vistaril 25 mgrs Q 6 hours PRN for anxiety as needed Increase  Remeron to 15  mgrs QHS for depression and insomnia  Treatment team working on disposition planning  options  Because of patient's report of intermittent dizziness, possible vertigo, and vaguely described cognitive concerns , will order Head MRI .  Neita Garnet, MD 06/18/2016, 2:46 PMPatient ID: Christopher Giles, male   DOB: 14-May-1966, 50 y.o.   MRN: [id number] Patient ID: Christopher Giles, male   DOB: [birth year], 50 y.o.   MRN: [id number]

## 2016-06-19 ENCOUNTER — Other Ambulatory Visit (HOSPITAL_COMMUNITY): Payer: Self-pay

## 2016-06-19 ENCOUNTER — Inpatient Hospital Stay (HOSPITAL_COMMUNITY): Payer: Federal, State, Local not specified - Other

## 2016-06-19 NOTE — [facility] Group Notes (Signed)
Adult Therapy Group Note  Date: 06/19/16 Time:  10:00-11:00AM  Group Topic/Focus: Unhealthy Coping Skills versus Healthy Coping Skills  Building Self Esteem:   The Focus of this group was to assist patients in becoming aware of the differences between healthy and unhealthy coping techniques, as well as how to determine which type they are using.   Reasons for choosing the unhealthy techniques were explored, which helped patients to provide support to each other and to determine that, in fact, they are not alone.  Participation Level:  Active  Participation Quality:  Attentive and Sharing  Affect:  Depressed and Flat  Cognitive:  Oriented  Insight: Improving  Engagement in Group:  Improving  Modes of Intervention:  Discussion, Exploration and Support  Additional Comments:  The patient expressed that in the past music has been a healthy coping technique, but he has not been listening to music recently.  He stated that his unhealthy coping techniques tend to be escaping from problems by isolating, then staying in bed all the time.  He said he has never been married, and one of the things he would like to do in his life is have that kind of close relationship.  Carloyn JaegerMareida J Grossman-Orr 06/12/2016, 12:35 PM

## 2016-06-19 NOTE — Progress Notes (Signed)
D: Pt endorsed moderate depression and severe anxiety; states, "I can't be myself; I can't look at people in the eye while am talking to them." Pt however denies pain, SI, HI or AVH. Pt is flat and withdrawn to self even while in the dayroom. A: Medications offered as prescribed.  Support, encouragement, and safe environment provided.  15-minute safety checks continue. R: Pt was med compliant.  Pt attended wrap-up group. Safety checks continue.

## 2016-06-19 NOTE — [facility] Group Notes (Signed)
Life SKills   Date:  06/19/2016  Time: 1300  Type of Therapy:  Nurse Education  /  Life SKills:   The group is focused on teaching patients how to identify their needs and then how to develop healthy coping skills needed to get thos needs met.  Participation Level:  Active  Participation Quality:  Attentive  Affect:  Depressed  Cognitive:  Alert  Insight:  Improving  Engagement in Group:  Engaged  Modes of Intervention:  Education  Summary of Progress/Problems:  Christopher Giles 06/19/2016, 4:08 PM

## 2016-06-19 NOTE — Progress Notes (Signed)
Christopher Giles looks and acts MUCH differently (better)today. For example, he is not ashen and the color of his skin is normalizing. He makes and keeps eye contact , he is able to awaken this morning and he comes to the med window immediately at 0800, he is able to carry on conversation intelligibly and he  Asks this Clinical research associatewriter about his medications today. A HE completes his daily assessment this morning. On it, he writes he denies  Active SI today and he rates his depression, hopelessness and anxiety " 9/9/9/, respectively. He is overheard speaking with his physician. He is honest and consistent with is self assessment as he presents it to his physician. At one point, he becomes quite tearful and cries but he allows this nurse to ground him. Follows nurse's cuing and takes deep breaths and in 35-45 seconds he regains his emotional composure. He then allows this hurse to take his soiled underwear and wash it. R Safety in place. MRI completed today ( recent comfusion????) and results are negative! Cont with poc and foster therapeutic relationship.

## 2016-06-19 NOTE — Progress Notes (Signed)
[facility] MD Progress Note  06/19/2016 1:38 PM Christopher Giles  MRN:  [id number] Subjective:  Patient continues to report feeling depressed, anxious, and states that interacting with others is an effort due to anxiety, low energy. He tends to worry about vague neurological symptoms, mainly intermittent dizziness, improved at this time, and a subjective sense his memory and cognitive abilities have decreased overtime. Another stressor is that he is unsure where he will go to live after discharge- prior to this admission had been staying with sister, but states it is unclear if he can return there on discharge. Denies medication side effects  Objective: I have discussed case with treatment team and have met with patient . Remains depressed, anxious, but there has been some improvement compared to admission - he is better groomed, has better eye contact, and seems less severely anxious . Denies active suicidal ideations at this time, and contracts for safety on unit.  No disruptive or agitated  behaviors on unit,he has been going to more groups ,and is visible on unit,but participation has tended to be limited,and chart notes indicate his affect has remained flat . At this time denies medication side effects   Principal Problem: Major depression, recurrent ([place]) Diagnosis:   Patient Active Problem List   Diagnosis Date Noted  . Major depression, recurrent ([place]) [F33.9] 06/13/2016   Total Time spent with patient: 20 minutes    Past Medical History:  Past Medical History:  Diagnosis Date  . Anxiety   . Depression   . Substance abuse   . Suicide attempt    x 6 per pt  . Thyroid disease    hypo    Past Surgical History:  Procedure Laterality Date  . punctured lung     Family History:  Family History  Problem Relation Age of Onset  . Hyperlipidemia Mother   . Cancer Mother   . Cancer Father   . Diabetes Maternal Grandmother     Social History:  History  Alcohol Use No     History   Drug Use No    Social History   Social History  . Marital status: Single    Spouse name: N/A  . Number of children: N/A  . Years of education: N/A   Social History Main Topics  . Smoking status: Current Every Day Smoker    Packs/day: 1.00    Types: Cigarettes  . Smokeless tobacco: Never Used  . Alcohol use No  . Drug use: No  . Sexual activity: Not Asked   Other Topics Concern  . None   Social History Narrative  . None   Additional Social History:   Sleep: gradually improving   Appetite:  Fair  Current Medications: Current Facility-Administered Medications  Medication Dose Route Frequency Provider Last Rate Last Dose  . acetaminophen (TYLENOL) tablet 650 mg  650 mg Oral Q6H PRN Rozetta Nunnery, NP      . alum & mag hydroxide-simeth (MAALOX/MYLANTA) 200-200-20 MG/5ML suspension 30 mL  30 mL Oral Q4H PRN Rozetta Nunnery, NP      . clonazePAM (KLONOPIN) tablet 0.5 mg  0.5 mg Oral BID Jenne Campus, MD   0.5 mg at 06/19/16 0836  . FLUoxetine (PROZAC) capsule 20 mg  20 mg Oral Daily Jenne Campus, MD   20 mg at 06/19/16 0835  . gabapentin (NEURONTIN) capsule 200 mg  200 mg Oral TID Jenne Campus, MD   200 mg at 06/19/16 0834  . hydrOXYzine (ATARAX/VISTARIL)  tablet 25 mg  25 mg Oral Q6H PRN Rozetta Nunnery, NP   25 mg at 06/18/16 1733  . levothyroxine (SYNTHROID, LEVOTHROID) tablet 75 mcg  75 mcg Oral QAC breakfast Jenne Campus, MD   75 mcg at 06/19/16 0620  . magnesium citrate solution 1 Bottle  1 Bottle Oral Once PRN Myer Peer Monicia Tse, MD      . magnesium hydroxide (MILK OF MAGNESIA) suspension 30 mL  30 mL Oral Daily PRN Rozetta Nunnery, NP   30 mL at 06/16/16 0841  . mirtazapine (REMERON) tablet 15 mg  15 mg Oral QHS Jenne Campus, MD   15 mg at 06/18/16 2103  . nicotine (NICODERM CQ - dosed in mg/24 hours) patch 21 mg  21 mg Transdermal Daily Jenne Campus, MD   21 mg at 06/19/16 0836  . tamsulosin (FLOMAX) capsule 0.4 mg  0.4 mg Oral QHS Jenne Campus, MD    0.4 mg at 06/18/16 2103    Lab Results: No results found for this or any previous visit (from the past 48 hour(s)).  Blood Alcohol level:  Lab Results  Component Value Date   ETH <5 14/78/2956    Metabolic Disorder Labs: No results found for: HGBA1C, MPG No results found for: PROLACTIN No results found for: CHOL, TRIG, HDL, CHOLHDL, VLDL, LDLCALC  Physical Findings: AIMS: Facial and Oral Movements Muscles of Facial Expression: None, normal Lips and Perioral Area: None, normal Jaw: None, normal Tongue: None, normal,Extremity Movements Upper (arms, wrists, hands, fingers): None, normal Lower (legs, knees, ankles, toes): None, normal, Trunk Movements Neck, shoulders, hips: None, normal, Overall Severity Severity of abnormal movements (highest score from questions above): None, normal Incapacitation due to abnormal movements: None, normal Patient's awareness of abnormal movements (rate only patient's report): No Awareness, Dental Status Current problems with teeth and/or dentures?: No Does patient usually wear dentures?: No  CIWA:    COWS:     Musculoskeletal: Strength & Muscle Tone: within normal limits Gait & Station: normal Patient leans: N/A  Psychiatric Specialty Exam: Physical Exam  ROS dizziness,  no chest pain or shortness of breath , no vomiting   Blood pressure 95/75, pulse (!) 102, temperature 97.4 F (36.3 C), temperature source Oral, resp. rate 18, height '6\' 3"'  (1.905 m), weight 196 lb (88.9 kg).Body mass index is 24.5 kg/m.  General Appearance:grooming is improved   Eye Contact:  Good  Speech:  Normal Rate  Volume:  Normal  Mood:  Depressed, anxious   Affect:  Constricted, vaguely anxious   Thought Process:  Linear  Orientation:  Other:  alert and attentive   Thought Content:  Denies hallucinations, no delusions expressed, not internally preoccupied, anxious ruminations   Suicidal Thoughts:  No- at this time denies any active suicidal ideations or self  injurious ideations, and contracts for safety on unit   Homicidal Thoughts:  No no homicidal or violent ideations   Memory:  recent and remote grossly intact   Judgement:  improving   Insight:  Improving   Psychomotor Activity:   More visible on unit today  Concentration:  Concentration: Good and Attention Span: Good- of note, yesterday scored 30/30 on MMSE   Recall:  Good  Fund of Knowledge:  Good  Language:  Good  Akathisia:  Negative  Handed:  Right  AIMS (if indicated):     Assets:  Desire for Improvement Resilience  ADL's:  Intact  Cognition:  WNL  Sleep:  Number of Hours: 6.5  Assessment - patient remains sad, depressed, anxious, ruminative , focusing on stressors. He is more visible on unit at this time, and has started to interact a little more in milieu. He denies suicidal ideations at this time, and contracts for safety on unit. Sleep is improving .  He ruminates, worries about subjective sense of cognitive difficulties, but scored 30 /30 on MMSE. Had been reported dizziness but this is improved and currently gait is steady. Thus far tolerating medications well .  Treatment Plan Summary: Daily contact with patient to assess and evaluate symptoms and progress in treatment, Medication management, Plan inpatient admission  and medications as below  Encourage group , milieu participation to work on coping skills and symptom reduction  Continue   Klonopin   0.5 mgrs BID for anxiety Continue Prozac 20 mgrs QDAY for depression, anxiety Continue  Neurontin 200 mgrs TID for ongoing anxiety, pain.  Continue Vistaril 25 mgrs Q 6 hours PRN for anxiety as needed Continue Remeron  15  mgrs QHS for depression and insomnia  Treatment team working on disposition planning options  Because of patient's report of intermittent dizziness, possible vertigo, and vaguely described cognitive concerns , will order Head MRI .  Neita Garnet, MD 06/19/2016, 1:38 PMPatient ID: Christopher Giles, male    DOB: 12/03/1965, 50 y.o.   MRN: [id number]

## 2016-06-19 NOTE — [facility] Group Notes (Addendum)
[facility] Group Notes:  (Nursing/MHT/Case Management/Adjunct)  Date:  06/19/2016  Time:  0900  Type of Therapy:  Nurse Education  /  Goals Group:  The group is focused on educating patients how to set attainable daily golas that will aid them In their recovery.  Participation Level:  Minimal  Participation Quality:  Attentive  Affect:  Anxious  Cognitive:  Alert  Insight:  Improving  Engagement in Group:  Engaged  Modes of Intervention:  Education  Summary of Progress/Problems:  Rich BraveDuke, Kameisha Malicki Lynn 06/19/2016, 10:44 AM

## 2016-06-20 NOTE — Progress Notes (Signed)
D: Patient pleasant and cooperative with care, but does have complaints of depression and anxiety. Pt given prn medications on request, which were effective. Pt interacting well with peers in the milieu. A: Encourage staff/peer interaction, medication compliance, and group participation. Administer medications as ordered, maintain Q 15 minute safety checks. R: Pt compliant with HS medications. Pt denies suicidal ideations this shift and verbally contracts for safety.

## 2016-06-20 NOTE — Progress Notes (Addendum)
D Jimmy ( as pt requests to be called) has spenta large portion of his day sleeping in his bed. He refused to get OOB for his lunch but did get up and fgo to the cafe for his dinner. He has been overheard by this Clinical research associatewriter talking about how bad he feels today...that he has no energy and that all " I want to do is sleep". A HE did complete his daily assessment and on it he wrote he deneid SI  And he rated his depression, hopelessness and anxiety " 9/8/8", respectively. R Safety in place.

## 2016-06-20 NOTE — Progress Notes (Signed)
[facility] MD Progress Note  06/20/2016 3:50 PM RIEL HIRSCHMAN  MRN:  [id number] Subjective:  Patient states that he feels there has been some modest to mild improvement of his mood since he was admitted, but states that today has been a " rough" day, feeling depressed, tired, with low motivation . States he has been forcing self to spend time in day room and avoid isolating . Denies active suicidal ideations and contracts for safety on unit. At this time tolerating medications well . Continues to report a subjective sense that his cognitive abilities have been slower than before, and states he fears he has been declining cognitively. He has been  partially reassured by 30/30 score on MMSE and by normal  Head MRI. He does report poor PO intake and unhealthy food choices prior to admission, gradually improving since he was admitted.    Objective: I have discussed case with treatment team and have met with patient . Patient has improved gradually compared to admission and at this time presents with a slightly improved mood and range of affect, although still depressed, sad, anxious, endorsing poor energy level, anhedonia. Denies suicidal ideations . As above, denies any medication side effects. Thus far tolerating Prozac and Remeron trials well. Visible on unit, pleasant on approach, no disruptive or agitated behaviors. Head MRI read as normal.    Principal Problem: Major depression, recurrent ([place]) Diagnosis:   Patient Active Problem List   Diagnosis Date Noted  . Major depression, recurrent ([place]) [F33.9] 06/13/2016   Total Time spent with patient: 20 minutes    Past Medical History:  Past Medical History:  Diagnosis Date  . Anxiety   . Depression   . Substance abuse   . Suicide attempt    x 6 per pt  . Thyroid disease    hypo    Past Surgical History:  Procedure Laterality Date  . punctured lung     Family History:  Family History  Problem Relation Age of Onset  . Hyperlipidemia  Mother   . Cancer Mother   . Cancer Father   . Diabetes Maternal Grandmother     Social History:  History  Alcohol Use No     History  Drug Use No    Social History   Social History  . Marital status: Single    Spouse name: N/A  . Number of children: N/A  . Years of education: N/A   Social History Main Topics  . Smoking status: Current Every Day Smoker    Packs/day: 1.00    Types: Cigarettes  . Smokeless tobacco: Never Used  . Alcohol use No  . Drug use: No  . Sexual activity: Not Asked   Other Topics Concern  . None   Social History Narrative  . None   Additional Social History:   Sleep: gradually improving   Appetite:  Fair  Current Medications: Current Facility-Administered Medications  Medication Dose Route Frequency Provider Last Rate Last Dose  . acetaminophen (TYLENOL) tablet 650 mg  650 mg Oral Q6H PRN Rozetta Nunnery, NP      . alum & mag hydroxide-simeth (MAALOX/MYLANTA) 200-200-20 MG/5ML suspension 30 mL  30 mL Oral Q4H PRN Rozetta Nunnery, NP   30 mL at 06/20/16 0924  . clonazePAM (KLONOPIN) tablet 0.5 mg  0.5 mg Oral BID Jenne Campus, MD   0.5 mg at 06/20/16 0908  . FLUoxetine (PROZAC) capsule 20 mg  20 mg Oral Daily Jenne Campus, MD  20 mg at 06/20/16 0908  . gabapentin (NEURONTIN) capsule 200 mg  200 mg Oral TID Jenne Campus, MD   200 mg at 06/20/16 1416  . hydrOXYzine (ATARAX/VISTARIL) tablet 25 mg  25 mg Oral Q6H PRN Rozetta Nunnery, NP   25 mg at 06/20/16 1518  . levothyroxine (SYNTHROID, LEVOTHROID) tablet 75 mcg  75 mcg Oral QAC breakfast Jenne Campus, MD   75 mcg at 06/20/16 0609  . magnesium citrate solution 1 Bottle  1 Bottle Oral Once PRN Myer Peer Kajsa Butrum, MD      . magnesium hydroxide (MILK OF MAGNESIA) suspension 30 mL  30 mL Oral Daily PRN Rozetta Nunnery, NP   30 mL at 06/16/16 0841  . mirtazapine (REMERON) tablet 15 mg  15 mg Oral QHS Myer Peer Quamir Willemsen, MD   15 mg at 06/19/16 2112  . nicotine (NICODERM CQ - dosed in mg/24  hours) patch 21 mg  21 mg Transdermal Daily Myer Peer Trestin Vences, MD   21 mg at 06/20/16 0800  . tamsulosin (FLOMAX) capsule 0.4 mg  0.4 mg Oral QHS Jenne Campus, MD   0.4 mg at 06/19/16 2113    Lab Results: No results found for this or any previous visit (from the past 48 hour(s)).  Blood Alcohol level:  Lab Results  Component Value Date   ETH <5 12/87/8676    Metabolic Disorder Labs: No results found for: HGBA1C, MPG No results found for: PROLACTIN No results found for: CHOL, TRIG, HDL, CHOLHDL, VLDL, LDLCALC  Physical Findings: AIMS: Facial and Oral Movements Muscles of Facial Expression: None, normal Lips and Perioral Area: None, normal Jaw: None, normal Tongue: None, normal,Extremity Movements Upper (arms, wrists, hands, fingers): None, normal Lower (legs, knees, ankles, toes): None, normal, Trunk Movements Neck, shoulders, hips: None, normal, Overall Severity Severity of abnormal movements (highest score from questions above): None, normal Incapacitation due to abnormal movements: None, normal Patient's awareness of abnormal movements (rate only patient's report): No Awareness, Dental Status Current problems with teeth and/or dentures?: No Does patient usually wear dentures?: No  CIWA:    COWS:     Musculoskeletal: Strength & Muscle Tone: within normal limits Gait & Station: normal Patient leans: N/A  Psychiatric Specialty Exam: Physical Exam  ROS dizziness,  no chest pain or shortness of breath , no vomiting   Blood pressure 103/68, pulse 93, temperature 97.7 F (36.5 C), temperature source Oral, resp. rate 16, height '6\' 3"'  (1.905 m), weight 196 lb (88.9 kg).Body mass index is 24.5 kg/m.  General Appearance: fairly groomed   Eye Contact:  Good  Speech:  Normal Rate  Volume:  Normal  Mood: remains depressed, some improvement compared to admission  Affect:  Constricted, does smile briefly at times, remains anxious   Thought Process:  Linear  Orientation:   Other:  alert and attentive   Thought Content:  Denies hallucinations, no delusions expressed, not internally preoccupied, anxious ruminations   Suicidal Thoughts:  No- at this time denies any active suicidal ideations or self injurious ideations, and contracts for safety on unit   Homicidal Thoughts:  No no homicidal or violent ideations   Memory:  recent and remote grossly intact   Judgement:  improving   Insight:  Improving   Psychomotor Activity:   More visible on unit today  Concentration:  Concentration: Good and Attention Span: Good- of note, yesterday scored 30/30 on MMSE   Recall:  Good  Fund of Knowledge:  Good  Language:  Good  Akathisia:  Negative  Handed:  Right  AIMS (if indicated):     Assets:  Desire for Improvement Resilience  ADL's:  Intact  Cognition:  WNL  Sleep:  Number of Hours: 6.25   Assessment - patient reports ongoing depression, sadness, and low motivation, energy. Also worries about subjective decrease in cognitive abilities, feels slowed . Of note, scored 30/30 in MMSE and a head MRI was reported as negative . We have reviewed poor concentration, difficulty making decisions, and decreased sense of self worth as symptoms of depression . At this time denies SI, and expresses hope he will continue to improve. No psychotic symptoms. Thus far tolerating medications well .    Treatment Plan Summary: Daily contact with patient to assess and evaluate symptoms and progress in treatment, Medication management, Plan inpatient admission  and medications as below  Encourage group , milieu participation to work on coping skills and symptom reduction  Continue   Klonopin   0.5 mgrs BID for anxiety Continue Prozac 20 mgrs QDAY for depression, anxiety Continue  Neurontin 200 mgrs TID for ongoing anxiety, pain.  Continue Vistaril 25 mgrs Q 6 hours PRN for anxiety as needed Continue Remeron  15  mgrs QHS for depression and insomnia  Treatment team working on disposition  planning options  Check Vitamin D, B12, Folate serum levels ( patient reports poor PO intake prior to admission, worries about potential vitamin deficiency contributing to symptoms)   Neita Garnet, MD 06/20/2016, 3:50 PMPatient ID: Rob Bunting, male   DOB: 11/05/1965, 50 y.o.   MRN: [id number] Patient ID: JASHUN PUERTAS, male   DOB: February 22, 1966, 50 y.o.   MRN: [id number]

## 2016-06-20 NOTE — Progress Notes (Signed)
Adult Psychoeducational Group Note  Date:  06/20/2016 Time:  9:32 PM  Group Topic/Focus:  Wrap-Up Group:   The focus of this group is to help patients review their daily goal of treatment and discuss progress on daily workbooks.   Participation Level:  Active  Participation Quality:  Appropriate  Affect:  Appropriate  Cognitive:  Alert, Appropriate and Oriented  Insight: Appropriate  Engagement in Group:  Engaged  Modes of Intervention:  Discussion  Additional Comments:  Patient attended group and said that his day was a 3.  His goal for today was to see his physician about his discharge plan and he did.  His coping skills for today is coloring.  Lada Fulbright W Annica Marinello 06/20/2016, 9:32 PM

## 2016-06-20 NOTE — [facility] Group Notes (Signed)
[facility] Group Notes: (Clinical Social Work)   06/20/2016      Type of Therapy:  Group Therapy   Participation Level:  Did Not Attend despite MHT prompting   Neo Yepiz Grossman-Orr, LCSW 06/20/2016, 12:24 PM     

## 2016-06-21 LAB — FOLATE: FOLATE: 12.1 ng/mL (ref >5.9)

## 2016-06-21 LAB — VITAMIN B12: Vitamin B-12: 207 pg/mL (ref 180–914)

## 2016-06-21 MED ORDER — FLUOXETINE HCL 10 MG PO CAPS
30.0000 mg | ORAL_CAPSULE | Freq: Every day | ORAL | Status: DC
  Administered 2016-06-22 – 2016-06-23 (×2): 30 mg via ORAL
  Filled 2016-06-21 (×3): qty 3

## 2016-06-21 NOTE — [facility] Group Notes (Signed)
[facility] LCSW Group Therapy  06/21/2016 1:15pm  Type of Therapy:  Group Therapy vercoming Obstacles  Participation Level:  Active  Participation Quality:  Appropriate   Affect:  Appropriate  Cognitive:  Appropriate and Oriented  Insight:  Developing/Improving and Improving  Engagement in Therapy:  Improving  Modes of Intervention:  Discussion, Exploration, Problem-solving and Support  Description of Group:   In this group patients will be encouraged to explore what they see as obstacles to their own wellness and recovery. They will be guided to discuss their thoughts, feelings, and behaviors related to these obstacles. The group will process together ways to cope with barriers, with attention given to specific choices patients can make. Each patient will be challenged to identify changes they are motivated to make in order to overcome their obstacles. This group will be process-oriented, with patients participating in exploration of their own experiences as well as giving and receiving support and challenge from other group members.  Summary of Patient Progress: Pt was more active in discussion today and was able to relate to participation and experiences of other peers. Pt expressed that his depression often times takes away his ability to notice "the small things" that bring him pleasure. Pt was engaged in discussion throughout.    Therapeutic Modalities:   Cognitive Behavioral Therapy Solution Focused Therapy Motivational Interviewing Relapse Prevention Therapy   Chad CordialLauren Carter, LCSWA 06/21/2016 4:21 PM

## 2016-06-21 NOTE — Progress Notes (Signed)
[facility] MD Progress Note  06/21/2016 1:22 PM Christopher Giles  MRN:  [id number] Subjective:  Patient states that he does not yet see any improvement in his mood, remains terribly anxious and depressed, tired low motivation but not suicidal because he says "I know I am in a safe place"  Reports improvement in dizziness-- Head MRI is normal. He does report improved  PO intake since admission  Objective: I have discussed case with treatment team and have met with patient . Patient remains  depressed, sad, anxious, endorsing poor energy level, anhedonia. Denies suicidal ideations . As above, denies any medication side effects. Thus far tolerating Prozac and Remeron trials well. Visible on unit, pleasant on approach, no disruptive or agitated behaviors. .    Principal Problem: Major depression, recurrent ([place]) Diagnosis:   Patient Active Problem List   Diagnosis Date Noted  . Major depression, recurrent ([place]) [F33.9] 06/13/2016   Total Time spent with patient: 20 minutes    Past Medical History:  Past Medical History:  Diagnosis Date  . Anxiety   . Depression   . Substance abuse   . Suicide attempt    x 6 per pt  . Thyroid disease    hypo    Past Surgical History:  Procedure Laterality Date  . punctured lung     Family History:  Family History  Problem Relation Age of Onset  . Hyperlipidemia Mother   . Cancer Mother   . Cancer Father   . Diabetes Maternal Grandmother     Social History:  History  Alcohol Use No     History  Drug Use No    Social History   Social History  . Marital status: Single    Spouse name: N/A  . Number of children: N/A  . Years of education: N/A   Social History Main Topics  . Smoking status: Current Every Day Smoker    Packs/day: 1.00    Types: Cigarettes  . Smokeless tobacco: Never Used  . Alcohol use No  . Drug use: No  . Sexual activity: Not Asked   Other Topics Concern  . None   Social History Narrative  . None   Additional  Social History:   Sleep: gradually improving   Appetite:  Fair  Current Medications: Current Facility-Administered Medications  Medication Dose Route Frequency Provider Last Rate Last Dose  . acetaminophen (TYLENOL) tablet 650 mg  650 mg Oral Q6H PRN Rozetta Nunnery, NP      . alum & mag hydroxide-simeth (MAALOX/MYLANTA) 200-200-20 MG/5ML suspension 30 mL  30 mL Oral Q4H PRN Rozetta Nunnery, NP   30 mL at 06/20/16 0924  . clonazePAM (KLONOPIN) tablet 0.5 mg  0.5 mg Oral BID Jenne Campus, MD   0.5 mg at 06/21/16 0831  . FLUoxetine (PROZAC) capsule 20 mg  20 mg Oral Daily Jenne Campus, MD   20 mg at 06/21/16 0831  . gabapentin (NEURONTIN) capsule 200 mg  200 mg Oral TID Jenne Campus, MD   200 mg at 06/21/16 0831  . hydrOXYzine (ATARAX/VISTARIL) tablet 25 mg  25 mg Oral Q6H PRN Rozetta Nunnery, NP   25 mg at 06/20/16 1518  . levothyroxine (SYNTHROID, LEVOTHROID) tablet 75 mcg  75 mcg Oral QAC breakfast Jenne Campus, MD   75 mcg at 06/21/16 3295  . magnesium citrate solution 1 Bottle  1 Bottle Oral Once PRN Jenne Campus, MD      . magnesium hydroxide (MILK  OF MAGNESIA) suspension 30 mL  30 mL Oral Daily PRN Rozetta Nunnery, NP   30 mL at 06/16/16 0841  . mirtazapine (REMERON) tablet 15 mg  15 mg Oral QHS Jenne Campus, MD   15 mg at 06/20/16 2121  . nicotine (NICODERM CQ - dosed in mg/24 hours) patch 21 mg  21 mg Transdermal Daily Jenne Campus, MD   21 mg at 06/21/16 0834  . tamsulosin (FLOMAX) capsule 0.4 mg  0.4 mg Oral QHS Jenne Campus, MD   0.4 mg at 06/20/16 2121    Lab Results:  Results for orders placed or performed during the hospital encounter of 06/13/16 (from the past 48 hour(s))  Vitamin B12     Status: None   Collection Time: 06/21/16  6:07 AM  Result Value Ref Range   Vitamin B-12 207 180 - 914 pg/mL    Comment: (NOTE) This assay is not validated for testing neonatal or myeloproliferative syndrome specimens for Vitamin B12 levels. Performed at [facility]   Folate     Status: None   Collection Time: 06/21/16  6:07 AM  Result Value Ref Range   Folate 12.1 >5.9 ng/mL    Comment: Performed at [facility]    Blood Alcohol level:  Lab Results  Component Value Date   [facility] <5 15/40/0867    Metabolic Disorder Labs: No results found for: HGBA1C, MPG No results found for: PROLACTIN No results found for: CHOL, TRIG, HDL, CHOLHDL, VLDL, LDLCALC  Physical Findings: AIMS: Facial and Oral Movements Muscles of Facial Expression: None, normal Lips and Perioral Area: None, normal Jaw: None, normal Tongue: None, normal,Extremity Movements Upper (arms, wrists, hands, fingers): None, normal Lower (legs, knees, ankles, toes): None, normal, Trunk Movements Neck, shoulders, hips: None, normal, Overall Severity Severity of abnormal movements (highest score from questions above): None, normal Incapacitation due to abnormal movements: None, normal Patient's awareness of abnormal movements (rate only patient's report): No Awareness, Dental Status Current problems with teeth and/or dentures?: No Does patient usually wear dentures?: No  CIWA:    COWS:     Musculoskeletal: Strength & Muscle Tone: within normal limits Gait & Station: normal Patient leans: N/A  Psychiatric Specialty Exam: Physical Exam  ROS dizziness,  no chest pain or shortness of breath , no vomiting   Blood pressure 104/61, pulse 79, temperature 97.7 F (36.5 C), temperature source Oral, resp. rate 16, height _0  (1.905 m), weight 88.9 kg (196 lb).Body mass index is 24.5 kg/m.  General Appearance: fairly groomed   Eye Contact:  Good  Speech:  Normal Rate  Volume:  Normal  Mood: remains depressed, some improvement compared to admission  Affect:  Constricted, does smile briefly at times, remains anxious   Thought Process:  Linear  Orientation:  Other:  alert and attentive   Thought Content:  Denies hallucinations, no delusions expressed, not internally  preoccupied, anxious ruminations   Suicidal Thoughts:  No- at this time denies any active suicidal ideations or self injurious ideations, and contracts for safety on unit   Homicidal Thoughts:  No no homicidal or violent ideations   Memory:  recent and remote grossly intact   Judgement:  improving   Insight:  Improving   Psychomotor Activity:   More visible on unit today  Concentration:  Concentration: Good and Attention Span: Good- of note, yesterday scored 30/30 on MMSE   Recall:  Good  Fund of Knowledge:  Good  Language:  Good  Akathisia:  Negative  Handed:  Right  AIMS (if indicated):     Assets:  Desire for Improvement Resilience  ADL's:  Intact  Cognition:  WNL  Sleep:  Number of Hours: 6.5   Assessment - patient reports ongoing depression, sadness, and low motivation, energy. Also worries about subjective decrease in cognitive abilities, feels slowed . Of note, scored 30/30 in MMSE and a head MRI was reported as negative . We have reviewed poor concentration, difficulty making decisions, and decreased sense of self worth as symptoms of depression . At this time denies SI, and expresses hope he will continue to improve. No psychotic symptoms. Thus far tolerating medications well .    Treatment Plan Summary: Daily contact with patient to assess and evaluate symptoms and progress in treatment, Medication management, Plan inpatient admission  and medications as below  Encourage group , milieu participation to work on coping skills and symptom reduction  Continue   Klonopin   0.5 mgrs BID for anxiety Increase  Prozac 30 mgrs QDAY for depression, anxiety Continue  Neurontin 200 mgrs TID for ongoing anxiety, pain.  Continue Vistaril 25 mgrs Q 6 hours PRN for anxiety as needed Continue Remeron  15  mgrs QHS for depression and insomnia  Treatment team working on disposition planning options  Check Vitamin D, B12, Folate serum levels ( patient reports poor PO intake prior to admission,  worries about potential vitamin deficiency contributing to symptoms)   Ruffin Frederick, MD 06/21/2016, 1:22 PMPatient ID: Christopher Giles, male   DOB: [birth year], 50 y.o.   MRN: [id number] Patient ID: Christopher Giles, male   DOB: 1966/05/15, 50 y.o.   MRN: [id number]

## 2016-06-21 NOTE — [facility] Group Notes (Signed)
Writer went over the rules of the unit and ask each patient how their day was. Patient stated his day was not good isolating for the most part but he talked with his sponsor and it finished on a good note. 3 out of 10.

## 2016-06-21 NOTE — Progress Notes (Signed)
Recreation Therapy Notes  Date: 06/21/16 Time: 0930 Location: [address] Group Room  Group Topic: Stress Management  Goal Area(s) Addresses:  Patient will verbalize importance of using healthy stress management.  Patient will identify positive emotions associated with healthy stress management.   Intervention: Guided Imagery  Activity :  Depression Imagery.  LRT introduced the technique of guided imagery to the patients.  LRT read a script allowing patients to participate in the activity.  Patients were to follow along as LRT read script.  Education:  Stress Management, Discharge Planning.   Education Outcome: Acknowledges edcuation/In group clarification offered/Needs additional education  Clinical Observations/Feedback: Pt did not attend group.     Caroll RancherMarjette Rc Amison, LRT/CTRS         Caroll RancherLindsay, Aniesa Boback A 06/21/2016 11:52 AM

## 2016-06-21 NOTE — Progress Notes (Signed)
D: Pt presents with flat affect and depressed mood. Pt reported feeling increasingly depressed and anxious this morning. Pt appeared disheveled on approach. Pt reported feeling dizzy this morning. V/s assessed. Pt encouraged to increase fluid intake. Pt denies suicidal thoughts and verbally contracts for safety. Pt has minimal interaction on the unit but appears to be invested in tx and attends groups.  A: Medications reviewed with pt. Medications administered as ordered per MD. Writer discussed pt complaints with MD during treatment team this morning. Pt encouraged to use call bell in room if he remains dizzy.Verbal support provided. Pt encouraged to attend groups. 15 minute checks performed for safety.  R: Pt receptive to tx.

## 2016-06-21 NOTE — Plan of Care (Signed)
Problem: Medication: Goal: Compliance with prescribed medication regimen will improve Outcome: Progressing Pt has been compliant with medications tonight.    

## 2016-06-21 NOTE — Progress Notes (Signed)
Nutrition Brief Note  Patient identified on the Malnutrition Screening Tool (MST) Report  Patient with insignificant weight loss. Pt reports poor PO intake PTA but intake has improved since admission. Serum vitamin levels checked and were WNL.   Wt Readings from Last 15 Encounters:  06/14/16 196 lb (88.9 kg)  06/19/16 196 lb (88.9 kg)  06/12/16 198 lb (89.8 kg)  05/31/16 198 lb (89.8 kg)  01/10/14 224 lb (101.6 kg)    Body mass index is 24.5 kg/m. Patient meets criteria for normal range based on current BMI.   Current diet order is regular. Labs and medications reviewed.   No nutrition interventions warranted at this time. If nutrition issues arise, please consult RD.   Tilda FrancoLindsey Story Vanvranken, MS, RD, LDN Pager: [phone] After Hours Pager: [phone]

## 2016-06-21 NOTE — Progress Notes (Signed)
D: Pt was in his room upon initial approach.  He describes his day as "terrible."  He reports his medications have not "kicked in yet, my anxiety is really bad."  Pt has anxious, depressed affect and mood.  Pt denies SI/HI, denies hallucinations, denies pain.  Pt has been visible in milieu and he attended evening group.  A: Introduced self to pt.  Met with pt and offered support and encouragement.  Actively listened to pt.  Medications administered per order.  PRN medication offered for anxiety, pt declined.    R: Pt is compliant with medications.  Pt verbally contracts for safety.  Will continue to monitor and assess.

## 2016-06-22 DIAGNOSIS — F332 Major depressive disorder, recurrent severe without psychotic features: Secondary | ICD-10-CM

## 2016-06-22 LAB — VITAMIN D 25 HYDROXY (VIT D DEFICIENCY, FRACTURES): Vit D, 25-Hydroxy: 9.7 ng/mL — ABNORMAL LOW (ref 30.0–100.0)

## 2016-06-22 NOTE — Progress Notes (Signed)
Patient stated he cannot follow people's conversation, distracted, cannot concentrate. Patient feels like he is a vegetable.  Does not feel that his medications are working for him now.  Patient says he will be back here if he is discharged.   Patient is scared that he will be discharged and he will continue to feel like this.  Plans to continue talking to his MD about his feelings and concerns several times throughout the day.  Patient has been tearful, upset, feels he is having running thoughts that are overwhelming to him.  Patient has talked tonight to his sister who is also concerned for patient's safety.   Patient would like family conference with him and his sister, the MD and also the SW.   Patient's sister is Veva Holesnn Aldridge phone cell [phone]. Patient stated he has held his feelings inside for a long time and feels he needs to let his feelings out so he will feel better and get the help he needs before he is discharged.  Patient does not want to be discharged until he can control his feelings to help himself.  Patient stated he does not have SI thoughts now [facility]  Patient is afraid the SI thoughts will return after discharge. Patient is not sure why his MD's have been changed and it is confusing to him.  Patient has trouble speaking up for himself, and expressing how he is feeling about himself,  especially when it comes to dealing with talking with MD/SW because of his extreme anxiety and depression. Patient stated when he does talk to MD/SW it does not come across correctly.

## 2016-06-22 NOTE — Progress Notes (Signed)
Recreation Therapy Notes  Animal-Assisted Activity (AAA) Program Checklist/Progress Notes Patient Eligibility Criteria Checklist & Daily Group note for Rec TxIntervention  Date: 10.10.2017 Time: 3:00pm [address]: [address]    AAA/T Program Assumption of Risk Form signed by Patient/ or Parent Legal Guardian Yes  Patient is free of allergies or sever asthma Yes  Patient reports no fear of animals Yes  Patient reports no history of cruelty to animals Yes  Patient understands his/her participation is voluntary Yes  Behavioral Response: Did not attend.   Marykay Lexenise L Nalany Steedley, LRT/CTRS        Christopher Giles L 06/22/2016 3:12 PM

## 2016-06-22 NOTE — [facility] Group Notes (Signed)
The focus of this group is to educate the patient on the purpose and policies of crisis stabilization and provide a format to answer questions about their admission.  The group details unit policies and expectations of patients while admitted.  Patient did not attend 0900 nurse education orientation group this morning.  Patient stayed in bed.   

## 2016-06-22 NOTE — Progress Notes (Signed)
D: Pt was in his room upon initial approach.  Pt presents with anxious, depressed affect and mood.  He complains of decreased concentration, depression, and anxiety.  His goal is to "write down whatever I'm going to say to the social worker and doctor tomorrow."  Pt states "I don't see any changes and I feel like my meds aren't working."  Pt states "I can't follow what people are saying and I misunderstand the words they're saying."  Pt denies SI/HI, denies hallucinations, denies pain.  Pt attended evening group.    A: Actively listened to pt and offered support and encouragement. Medications administered per order.  PRN medication administered for anxiety.  R: Pt is safe on the unit.  Pt is compliant with medications.  Pt verbally contracts for safety.  Will continue to monitor and assess.

## 2016-06-22 NOTE — Progress Notes (Signed)
D: Pt has depressed affect and mood.  He reports his day "wasn't very good, but it got a little better, I talked to my sponsor."  Pt reports his goal tonight is to "stay up and go to group."  Pt denies SI/HI, denies hallucinations, denies pain.  Pt has been visible in milieu interacting with peers and staff appropriately.  Pt attended evening group.   A: Actively listened to pt and offered support and encouragement. Medications administered per order.   R: Pt is compliant with medications.  Pt verbally contracts for safety.  Will continue to monitor and assess.

## 2016-06-22 NOTE — [facility] Group Notes (Signed)
[facility] LCSW Group Therapy 06/22/2016 1:15 PM  Type of Therapy: Group Therapy- Feelings about Diagnosis  Pt did not attend, declined invitation.   Chad CordialLauren Carter, LCSWA 06/22/2016 3:51 PM

## 2016-06-22 NOTE — Progress Notes (Signed)
D:  Patient's self inventory sheet, patient sleeps good, sleep medication is helpful.  Fair appetite, low energy level, poor concentration.  Rated depression and anxiety 10, hopeless 9.  Denied withdrawals.  Denied SI.  Denied physical problems.  Denied pain.  Goal is decrease in depression.  A:  Medications administered per MD orders.  Emotional support and encouragement given patient. R:  Denied SI and HI while talking to nurse this morning.  Contracts for safety.  Denied A/V hallucinations.  Safety maintained with 15 minute checks.

## 2016-06-22 NOTE — Progress Notes (Addendum)
[facility] MD Progress Note  06/22/2016 11:31 AM FRANCO DULEY  MRN:  [id number] Subjective:  Patient states that he does not yet see any improvement in his mood, remains terribly anxious and depressed, tired low motivation No dizziness-- Head MRI is normal. He does report improved  PO intake since admission.   Objective: I have discussed case with treatment team and have met with patient . Patient remains  depressed, sad, anxious, endorsing poor energy level, anhedonia. SW will contact  Dr Weber Cooks of the [facility] for ECT referral. Denies suicidal ideations . As above, denies any medication side effects. Thus far tolerating Prozac and Remeron trials well and so will increase Prozac to 40 mg daily. Visible on unit, pleasant on approach, no disruptive or agitated behaviors. .    Principal Problem: Major depression, recurrent ([place]) Diagnosis:   Patient Active Problem List   Diagnosis Date Noted  . Major depression, recurrent ([place]) [F33.9] 06/13/2016   Total Time spent with patient: 25 minutes    Past Medical History:  Past Medical History:  Diagnosis Date  . Anxiety   . Depression   . Substance abuse   . Suicide attempt    x 6 per pt  . Thyroid disease    hypo    Past Surgical History:  Procedure Laterality Date  . punctured lung     Family History:  Family History  Problem Relation Age of Onset  . Hyperlipidemia Mother   . Cancer Mother   . Cancer Father   . Diabetes Maternal Grandmother     Social History:  History  Alcohol Use No     History  Drug Use No    Social History   Social History  . Marital status: Single    Spouse name: N/A  . Number of children: N/A  . Years of education: N/A   Social History Main Topics  . Smoking status: Current Every Day Smoker    Packs/day: 1.00    Types: Cigarettes  . Smokeless tobacco: Never Used  . Alcohol use No  . Drug use: No  . Sexual activity: Not Asked   Other Topics Concern  . None    Social History Narrative  . None   Additional Social History:   Sleep: gradually improving   Appetite:  Fair  Current Medications: Current Facility-Administered Medications  Medication Dose Route Frequency Provider Last Rate Last Dose  . acetaminophen (TYLENOL) tablet 650 mg  650 mg Oral Q6H PRN Rozetta Nunnery, NP      . alum & mag hydroxide-simeth (MAALOX/MYLANTA) 200-200-20 MG/5ML suspension 30 mL  30 mL Oral Q4H PRN Rozetta Nunnery, NP   30 mL at 06/20/16 0924  . clonazePAM (KLONOPIN) tablet 0.5 mg  0.5 mg Oral BID Jenne Campus, MD   0.5 mg at 06/22/16 3664  . FLUoxetine (PROZAC) capsule 30 mg  30 mg Oral Daily Sueanne Margarita, MD   30 mg at 06/22/16 4034  . gabapentin (NEURONTIN) capsule 200 mg  200 mg Oral TID Jenne Campus, MD   200 mg at 06/22/16 7425  . hydrOXYzine (ATARAX/VISTARIL) tablet 25 mg  25 mg Oral Q6H PRN Rozetta Nunnery, NP   25 mg at 06/21/16 1907  . levothyroxine (SYNTHROID, LEVOTHROID) tablet 75 mcg  75 mcg Oral QAC breakfast Jenne Campus, MD   75 mcg at 06/22/16 0615  . magnesium citrate solution 1 Bottle  1 Bottle Oral Once PRN Jenne Campus, MD      .  magnesium hydroxide (MILK OF MAGNESIA) suspension 30 mL  30 mL Oral Daily PRN Rozetta Nunnery, NP   30 mL at 06/16/16 0841  . mirtazapine (REMERON) tablet 15 mg  15 mg Oral QHS Jenne Campus, MD   15 mg at 06/21/16 2106  . nicotine (NICODERM CQ - dosed in mg/24 hours) patch 21 mg  21 mg Transdermal Daily Jenne Campus, MD   21 mg at 06/22/16 0813  . tamsulosin (FLOMAX) capsule 0.4 mg  0.4 mg Oral QHS Jenne Campus, MD   0.4 mg at 06/21/16 2106    Lab Results:  Results for orders placed or performed during the hospital encounter of 06/13/16 (from the past 48 hour(s))  VITAMIN D 25 Hydroxy (Vit-D Deficiency, Fractures)     Status: Abnormal   Collection Time: 06/21/16  6:07 AM  Result Value Ref Range   Vit D, 25-Hydroxy 9.7 (L) 30.0 - 100.0 ng/mL    Comment: (NOTE) Vitamin D deficiency has been  defined by the Institute of Medicine and an Endocrine Society practice guideline as a level of serum 25-OH vitamin D less than 20 ng/mL (1,2). The Endocrine Society went on to further define vitamin D insufficiency as a level between 21 and 29 ng/mL (2). 1. IOM (Institute of Medicine). 2010. Dietary reference   intakes for calcium and D. [place]: The   [employer]. 2. Holick MF, Binkley Ingalls, Bischoff-Ferrari HA, et al.   Evaluation, treatment, and prevention of vitamin D   deficiency: an Endocrine Society clinical practice   guideline. JCEM. 2011 Jul; 96(7):1911-30. Performed At: [facility] [place], [place] [id number] Lindon Romp MD [id number] Performed at [facility]   Vitamin B12     Status: None   Collection Time: 06/21/16  6:07 AM  Result Value Ref Range   Vitamin B-12 207 180 - 914 pg/mL    Comment: (NOTE) This assay is not validated for testing neonatal or myeloproliferative syndrome specimens for Vitamin B12 levels. Performed at [facility]   Folate     Status: None   Collection Time: 06/21/16  6:07 AM  Result Value Ref Range   Folate 12.1 >5.9 ng/mL    Comment: Performed at [facility]    Blood Alcohol level:  Lab Results  Component Value Date   [facility] <5 22/44/9753    Metabolic Disorder Labs: No results found for: HGBA1C, MPG No results found for: PROLACTIN No results found for: CHOL, TRIG, HDL, CHOLHDL, VLDL, LDLCALC  Physical Findings: AIMS: Facial and Oral Movements Muscles of Facial Expression: None, normal Lips and Perioral Area: None, normal Jaw: None, normal Tongue: None, normal,Extremity Movements Upper (arms, wrists, hands, fingers): None, normal Lower (legs, knees, ankles, toes): None, normal, Trunk Movements Neck, shoulders, hips: None, normal, Overall Severity Severity of abnormal movements (highest score from questions above): None, normal Incapacitation due  to abnormal movements: None, normal Patient's awareness of abnormal movements (rate only patient's report): No Awareness, Dental Status Current problems with teeth and/or dentures?: No Does patient usually wear dentures?: No  CIWA:    COWS:     Musculoskeletal: Strength & Muscle Tone: within normal limits Gait & Station: normal Patient leans: N/A  Psychiatric Specialty Exam: Physical Exam  ROS dizziness,  no chest pain or shortness of breath , no vomiting   Blood pressure 98/63, pulse (!) 113, temperature 98.2 F (36.8 C), temperature source Oral, resp. rate 16, height _0  (1.905 m), weight  88.9 kg (196 lb).Body mass index is 24.5 kg/m.  General Appearance: fairly groomed   Eye Contact:  Good  Speech:  Normal Rate  Volume:  Normal  Mood: remains depressed, some improvement compared to admission  Affect:  Constricted and anxious   Thought Process:  Linear  Orientation:  Other:  alert and attentive   Thought Content:  Denies hallucinations, no delusions expressed, not internally preoccupied, anxious ruminations   Suicidal Thoughts:  No- at this time denies any active suicidal ideations or self injurious ideations, and contracts for safety on unit   Homicidal Thoughts:  No no homicidal or violent ideations   Memory:  recent and remote grossly intact   Judgement:  improving   Insight:  Improving   Psychomotor Activity:   More visible on unit today  Concentration:  Concentration: Good and Attention Span: Good- of note, yesterday scored 30/30 on MMSE   Recall:  Good  Fund of Knowledge:  Good  Language:  Good  Akathisia:  Negative  Handed:  Right  AIMS (if indicated):     Assets:  Desire for Improvement Resilience  ADL's:  Intact  Cognition:  WNL  Sleep:  Number of Hours: 6.5   Assessment - patient reports ongoing depression, sadness, and low motivation, energy. Also worries about subjective decrease in cognitive abilities, feels slowed . Of note, scored 30/30 in MMSE and a  head MRI was reported as negative . We have reviewed poor concentration, difficulty making decisions, and decreased sense of self worth as symptoms of depression . At this time denies SI, and expresses hope he will continue to improve. No psychotic symptoms. Thus far tolerating medications well .    Treatment Plan Summary: Daily contact with patient to assess and evaluate symptoms and progress in treatment, Medication management, Plan inpatient admission  and medications as below  Encourage group , milieu participation to work on coping skills and symptom reduction  Continue   Klonopin   0.5 mgrs BID for anxiety Increase  Prozac 40 mgrs QDAY for depression, anxiety Continue  Neurontin 200 mgrs TID for ongoing anxiety, pain.  Continue Vistaril 25 mgrs Q 6 hours PRN for anxiety as needed Continue Remeron  15  mgrs QHS for depression and insomnia  Treatment team working on disposition planning options  Check Vitamin D, B12, Folate serum levels ( patient reports poor PO intake prior to admission, worries about potential vitamin deficiency contributing to symptoms)   Ruffin Frederick, MD 06/22/2016, 11:31 AMPatient ID: Christopher Giles, male   DOB: 1966/03/18, 50 y.o.   MRN: [id number] Patient ID: WADSWORTH SKOLNICK, male   DOB: [birth year], 49 y.o.   MRN: [id number]

## 2016-06-22 NOTE — [facility] Group Notes (Signed)
Rules of the unit were explained during group and writer asked patients how their day was. Pt stated his day was not good at all slept most of the day isolated meds not working. Pt stated it was a 2.

## 2016-06-22 NOTE — Plan of Care (Signed)
Problem: Education: Goal: Utilization of techniques to improve thought processes will improve Outcome: Progressing Nurse discussed depression/coping skills with patient.    

## 2016-06-23 MED ORDER — CHOLECALCIFEROL 10 MCG (400 UNIT) PO TABS
400.0000 [IU] | ORAL_TABLET | Freq: Every day | ORAL | Status: DC
  Administered 2016-06-23 – 2016-06-28 (×6): 400 [IU] via ORAL
  Filled 2016-06-23 (×7): qty 1

## 2016-06-23 MED ORDER — BUPROPION HCL ER (XL) 150 MG PO TB24
150.0000 mg | ORAL_TABLET | Freq: Every day | ORAL | Status: DC
  Administered 2016-06-23 – 2016-06-25 (×3): 150 mg via ORAL
  Filled 2016-06-23 (×4): qty 1

## 2016-06-23 MED ORDER — SERTRALINE HCL 25 MG PO TABS
25.0000 mg | ORAL_TABLET | Freq: Every day | ORAL | Status: DC
  Administered 2016-06-23 – 2016-06-25 (×3): 25 mg via ORAL
  Filled 2016-06-23 (×4): qty 1

## 2016-06-23 NOTE — Progress Notes (Signed)
D: Pt presents with flat affect and depressed mood. Pt reported feeling increasingly depressed and anxious today. Pt rated depression and anxiety 10/10. Pt stated that his medications are not effective. Pt reported having racing thoughts during the day and at bedtime. Pt stated that he's having a hard time concentrating today. Pt denies suicidal thoughts and verbally contracts for safety. A: Medications reviewed with pt. Education provided on new meds started today. Verbal support provided. Pt encouraged to attend groups. 15 minute checks performed for safety.  R: Pt receptive to tx.

## 2016-06-23 NOTE — [facility] Group Notes (Signed)
[facility] LCSW Group Therapy 06/23/2016 1:15 PM  Type of Therapy: Group Therapy- Emotion Regulation  Participation Level: Active   Participation Quality:  Appropriate  Affect: Flat  Cognitive: Alert and Oriented   Insight:  Developing/Improving  Engagement in Therapy: Developing/Improving and Engaged   Modes of Intervention: Clarification, Confrontation, Discussion, Education, Exploration, Limit-setting, Orientation, Problem-solving, Rapport Building, Dance movement psychotherapisteality Testing, Socialization and Support  Summary of Progress/Problems: The topic for group today was emotional regulation. This group focused on both positive and negative emotion identification and allowed group members to process ways to identify feelings, regulate negative emotions, and find healthy ways to manage internal/external emotions. Group members were asked to reflect on a time when their reaction to an emotion led to a negative outcome and explored how alternative responses using emotion regulation would have benefited them. Group members were also asked to discuss a time when emotion regulation was utilized when a negative emotion was experienced. Pt was more active in group discussion today and identified anxiety and sadness as emotions that are difficult to regulate. Pt was able to relate to symptoms and experiences shared by peers and described isolation as a negative behavior that he uses in attempts to cope with both sadness and anxiety.   Christopher CordialLauren Carter, LCSWA 06/23/2016 3:21 PM

## 2016-06-23 NOTE — Tx Team (Signed)
Interdisciplinary Treatment and Diagnostic Plan Update  06/23/2016 Time of Session: 10 AM Christopher Giles MRN: [id number]  Principal Diagnosis: Major depression, recurrent (HCC)  Secondary Diagnoses: Principal Problem:   Major depression, recurrent (HCC) Active Problems:   Severe episode of recurrent major depressive disorder, without psychotic features (HCC)   Current Medications:  Current Facility-Administered Medications  Medication Dose Route Frequency Provider Last Rate Last Dose  . acetaminophen (TYLENOL) tablet 650 mg  650 mg Oral Q6H PRN Jackelyn Poling, NP      . alum & mag hydroxide-simeth (MAALOX/MYLANTA) 200-200-20 MG/5ML suspension 30 mL  30 mL Oral Q4H PRN Jackelyn Poling, NP   30 mL at 06/22/16 1409  . buPROPion (WELLBUTRIN XL) 24 hr tablet 150 mg  150 mg Oral Daily Molinda Bailiff, MD   150 mg at 06/23/16 1154  . cholecalciferol (VITAMIN D) tablet 400 Units  400 Units Oral Daily Molinda Bailiff, MD   400 Units at 06/23/16 1154  . clonazePAM (KLONOPIN) tablet 0.5 mg  0.5 mg Oral BID Craige Cotta, MD   0.5 mg at 06/23/16 0753  . gabapentin (NEURONTIN) capsule 200 mg  200 mg Oral TID Craige Cotta, MD   200 mg at 06/23/16 1450  . hydrOXYzine (ATARAX/VISTARIL) tablet 25 mg  25 mg Oral Q6H PRN Jackelyn Poling, NP   25 mg at 06/22/16 1959  . levothyroxine (SYNTHROID, LEVOTHROID) tablet 75 mcg  75 mcg Oral QAC breakfast Craige Cotta, MD   75 mcg at 06/23/16 [phone]  . magnesium citrate solution 1 Bottle  1 Bottle Oral Once PRN Rockey Situ Cobos, MD      . magnesium hydroxide (MILK OF MAGNESIA) suspension 30 mL  30 mL Oral Daily PRN Jackelyn Poling, NP   30 mL at 06/22/16 1546  . mirtazapine (REMERON) tablet 15 mg  15 mg Oral QHS Craige Cotta, MD   15 mg at 06/22/16 2111  . nicotine (NICODERM CQ - dosed in mg/24 hours) patch 21 mg  21 mg Transdermal Daily Craige Cotta, MD   21 mg at 06/23/16 0753  . sertraline (ZOLOFT) tablet 25 mg  25 mg Oral Daily Molinda Bailiff, MD   25  mg at 06/23/16 1154  . tamsulosin (FLOMAX) capsule 0.4 mg  0.4 mg Oral QHS Craige Cotta, MD   0.4 mg at 06/22/16 2111   PTA Medications: Prescriptions Prior to Admission  Medication Sig Dispense Refill Last Dose  . bismuth subsalicylate (PEPTO BISMOL) 262 MG chewable tablet Chew 524 mg by mouth as needed for indigestion or diarrhea or loose stools.   unk  . clonazePAM (KLONOPIN) 1 MG tablet Take 1 mg by mouth 3 (three) times daily as needed for anxiety.   06/12/2016 at Unknown time  . DULoxetine (CYMBALTA) 60 MG capsule Take 60 mg by mouth daily.   06/12/2016 at Unknown time  . ibuprofen (ADVIL,MOTRIN) 200 MG tablet Take 400 mg by mouth every 6 (six) hours as needed for moderate pain.   unk  . levothyroxine (SYNTHROID, LEVOTHROID) 50 MCG tablet Take 1 tablet (50 mcg total) by mouth daily before breakfast. (Patient taking differently: Take 75 mcg by mouth daily before breakfast. ) 30 tablet 1 06/12/2016 at Unknown time  . Menthol, Topical Analgesic, (BIOFREEZE ROLL-ON COLORLESS) 4 % GEL Apply 1 application topically daily as needed (pain).   unk  . Menthol-Methyl Salicylate (ICY HOT BALM EXTRA STRENGTH) 7.6-29 % OINT Apply 1 application topically daily as  needed (pain).   unk  . naproxen sodium (ANAPROX) 220 MG tablet Take 220 mg by mouth 2 (two) times daily as needed (pain).   unk    Patient Stressors: Medication change or noncompliance Occupational concerns  Patient Strengths: Average or above average intelligence Motivation for treatment/growth Physical Health Supportive family/friends  Treatment Modalities: Medication Management, Group therapy, Case management,  1 to 1 session with clinician, Psychoeducation, Recreational therapy.   Physician Treatment Plan for Primary Diagnosis: Major depression, recurrent (HCC) Long Term Goal(s): Improvement in symptoms so as ready for discharge Improvement in symptoms so as ready for discharge   Short Term Goals: Ability to identify changes in  lifestyle to reduce recurrence of condition will improve Ability to verbalize feelings will improve Ability to identify and develop effective coping behaviors will improve Compliance with prescribed medications will improve Ability to identify changes in lifestyle to reduce recurrence of condition will improve Ability to demonstrate self-control will improve Ability to maintain clinical measurements within normal limits will improve Ability to identify triggers associated with substance abuse/mental health issues will improve  Medication Management: Evaluate patient's response, side effects, and tolerance of medication regimen.  Therapeutic Interventions: 1 to 1 sessions, Unit Group sessions and Medication administration.  Evaluation of Outcomes: Progressing  Physician Treatment Plan for Secondary Diagnosis: Principal Problem:   Major depression, recurrent (HCC) Active Problems:   Severe episode of recurrent major depressive disorder, without psychotic features (HCC)  Long Term Goal(s): Improvement in symptoms so as ready for discharge Improvement in symptoms so as ready for discharge   Short Term Goals: Ability to identify changes in lifestyle to reduce recurrence of condition will improve Ability to verbalize feelings will improve Ability to identify and develop effective coping behaviors will improve Compliance with prescribed medications will improve Ability to identify changes in lifestyle to reduce recurrence of condition will improve Ability to demonstrate self-control will improve Ability to maintain clinical measurements within normal limits will improve Ability to identify triggers associated with substance abuse/mental health issues will improve     Medication Management: Evaluate patient's response, side effects, and tolerance of medication regimen.  Therapeutic Interventions: 1 to 1 sessions, Unit Group sessions and Medication administration.  Evaluation of Outcomes:  Progressing   RN Treatment Plan for Primary Diagnosis: Major depression, recurrent (HCC) Long Term Goal(s): Knowledge of disease and therapeutic regimen to maintain health will improve  Short Term Goals: Ability to disclose and discuss suicidal ideas and Ability to identify and develop effective coping behaviors will improve  Medication Management: RN will administer medications as ordered by provider, will assess and evaluate patient's response and provide education to patient for prescribed medication. RN will report any adverse and/or side effects to prescribing provider.  Therapeutic Interventions: 1 on 1 counseling sessions, Psychoeducation, Medication administration, Evaluate responses to treatment, Monitor vital signs and CBGs as ordered, Perform/monitor CIWA, COWS, AIMS and Fall Risk screenings as ordered, Perform wound care treatments as ordered.  Evaluation of Outcomes: Progressing   LCSW Treatment Plan for Primary Diagnosis: Major depression, recurrent (HCC) Long Term Goal(s): Safe transition to appropriate next level of care at discharge, Engage patient in therapeutic group addressing interpersonal concerns.  Short Term Goals: Engage patient in aftercare planning with referrals and resources, Increase social support and Increase ability to appropriately verbalize feelings  Therapeutic Interventions: Assess for all discharge needs, 1 to 1 time with Social worker, Explore available resources and support systems, Assess for adequacy in community support network, Educate family and significant other(s) on  suicide prevention, Complete Psychosocial Assessment, Interpersonal group therapy.  Evaluation of Outcomes: Progressing   Progress in Treatment: Attending groups: Yes. Participating in groups: Progressing Taking medication as prescribed: Yes. Toleration medication: Yes. Family/Significant other contact made: No, CSW attempting to make contact with sister Patient understands  diagnosis: Yes. Discussing patient identified problems/goals with staff: Yes. Medical problems stabilized or resolved: Yes. Denies suicidal/homicidal ideation: No, Pt endorses passive SI Issues/concerns per patient self-inventory: No. Other: na  New problem(s) identified: No, Describe:  none at this time  New Short Term/Long Term Goal(s): None identified at this time.   Discharge Plan or Barriers: limited support network, unstable housing  06/18/16: Pt continues to be unsure if he can return to his sister's house; will follow-up with outpatient rescources  Reason for Continuation of Hospitalization: Depression Medication stabilization Suicidal ideation  Estimated Length of Stay: 2-4 days  Attendees: Patient: 06/23/2016 3:34 PM  Physician: Dr. Seth BakeLekwauwa MD 06/23/2016 3:34 PM  Nursing: Romeo RabonPatrice White Caroline Beaudry 06/23/2016 3:34 PM  RN Care Manager: Sondra BargesJ Clark RN 06/23/2016 3:34 PM  Social Worker: Chad CordialLauren Carter LCSW 06/23/2016 3:34 PM  Recreational Therapist:  06/23/2016 3:34 PM  Other: Gray BernhardtMay Augustin, NP 06/23/2016 3:34 PM  Other:  06/23/2016 3:34 PM  Other: 06/23/2016 3:34 PM    Scribe for Treatment Team: Elaina HoopsLauren M Carter, LCSW 06/23/2016 3:34 PM

## 2016-06-23 NOTE — Progress Notes (Signed)
[facility] MD Progress Note  06/23/2016 10:28 AM Christopher Giles  MRN:  [id number] Subjective:  Patient  cntinues to complain of depressed  And anxious mood and does not think his medications are working. Patient was discussed with treatment team with Dr Parke Poisson  present who agreed to reach out to Dr Lambert Mody for [facility] for ECT referral. It was also agreed that SW weill have a phone conference with patient's sister and that MD will speak is recommended by SW. Head MRI is normal. He does report improved  PO intake since admission.   Objective: I have discussed case with treatment team and have met with patient . Patient reported that his mother and Sister boyh had a good response to wellbutrin and that he personally had a good response to zoloft for several years which had syopped working. Patient" serum vitamin D level is low  Visible on unit, pleasant on approach, no disruptive or agitated behaviors. .    Principal Problem: Major depression, recurrent ([place]) Diagnosis:   Patient Active Problem List   Diagnosis Date Noted  . Severe episode of recurrent major depressive disorder, without psychotic features ([place]) [F33.2]   . Major depression, recurrent ([place]) [F33.9] 06/13/2016   Total Time spent with patient: 25 minutes    Past Medical History:  Past Medical History:  Diagnosis Date  . Anxiety   . Depression   . Substance abuse   . Suicide attempt    x 6 per pt  . Thyroid disease    hypo    Past Surgical History:  Procedure Laterality Date  . punctured lung     Family History:  Family History  Problem Relation Age of Onset  . Hyperlipidemia Mother   . Cancer Mother   . Cancer Father   . Diabetes Maternal Grandmother     Social History:  History  Alcohol Use No     History  Drug Use No    Social History   Social History  . Marital status: Single    Spouse name: N/A  . Number of children: N/A  . Years of education: N/A   Social History Main Topics  . Smoking status:  Current Every Day Smoker    Packs/day: 1.00    Types: Cigarettes  . Smokeless tobacco: Never Used  . Alcohol use No  . Drug use: No  . Sexual activity: Not Asked   Other Topics Concern  . None   Social History Narrative  . None   Additional Social History:   Sleep: gradually improving   Appetite:  Fair  Current Medications: Current Facility-Administered Medications  Medication Dose Route Frequency Provider Last Rate Last Dose  . acetaminophen (TYLENOL) tablet 650 mg  650 mg Oral Q6H PRN Rozetta Nunnery, NP      . alum & mag hydroxide-simeth (MAALOX/MYLANTA) 200-200-20 MG/5ML suspension 30 mL  30 mL Oral Q4H PRN Rozetta Nunnery, NP   30 mL at 06/22/16 1409  . buPROPion (WELLBUTRIN XL) 24 hr tablet 150 mg  150 mg Oral Daily Sueanne Margarita, MD      . clonazePAM Bobbye [place]) tablet 0.5 mg  0.5 mg Oral BID Jenne Campus, MD   0.5 mg at 06/23/16 0753  . gabapentin (NEURONTIN) capsule 200 mg  200 mg Oral TID Jenne Campus, MD   200 mg at 06/23/16 0753  . hydrOXYzine (ATARAX/VISTARIL) tablet 25 mg  25 mg Oral Q6H PRN Rozetta Nunnery, NP   25 mg at 06/22/16 1959  .  levothyroxine (SYNTHROID, LEVOTHROID) tablet 75 mcg  75 mcg Oral QAC breakfast Jenne Campus, MD   75 mcg at 06/23/16 [phone]  . magnesium citrate solution 1 Bottle  1 Bottle Oral Once PRN Myer Peer Cobos, MD      . magnesium hydroxide (MILK OF MAGNESIA) suspension 30 mL  30 mL Oral Daily PRN Rozetta Nunnery, NP   30 mL at 06/22/16 1546  . mirtazapine (REMERON) tablet 15 mg  15 mg Oral QHS Jenne Campus, MD   15 mg at 06/22/16 2111  . nicotine (NICODERM CQ - dosed in mg/24 hours) patch 21 mg  21 mg Transdermal Daily Jenne Campus, MD   21 mg at 06/23/16 0753  . sertraline (ZOLOFT) tablet 25 mg  25 mg Oral Daily Sueanne Margarita, MD      . tamsulosin [facility]) capsule 0.4 mg  0.4 mg Oral QHS Jenne Campus, MD   0.4 mg at 06/22/16 2111    Lab Results:  No results found for this or any previous visit (from the past 48  hour(s)).  Blood Alcohol level:  Lab Results  Component Value Date   ETH <5 71/16/5790    Metabolic Disorder Labs: No results found for: HGBA1C, MPG No results found for: PROLACTIN No results found for: CHOL, TRIG, HDL, CHOLHDL, VLDL, LDLCALC  Physical Findings: AIMS: Facial and Oral Movements Muscles of Facial Expression: None, normal Lips and Perioral Area: None, normal Jaw: None, normal Tongue: None, normal,Extremity Movements Upper (arms, wrists, hands, fingers): None, normal Lower (legs, knees, ankles, toes): None, normal, Trunk Movements Neck, shoulders, hips: None, normal, Overall Severity Severity of abnormal movements (highest score from questions above): None, normal Incapacitation due to abnormal movements: None, normal Patient's awareness of abnormal movements (rate only patient's report): No Awareness, Dental Status Current problems with teeth and/or dentures?: No Does patient usually wear dentures?: No  CIWA:  CIWA-Ar Total: 1 COWS:  COWS Total Score: 1  Musculoskeletal: Strength & Muscle Tone: within normal limits Gait & Station: normal Patient leans: N/A  Psychiatric Specialty Exam: Physical Exam  ROS dizziness,  no chest pain or shortness of breath , no vomiting   Blood pressure (!) 92/59, pulse 95, temperature 97.8 F (36.6 C), temperature source Oral, resp. rate 16, height '6\' 3"'  (1.905 m), weight 88.9 kg (196 lb).Body mass index is 24.5 kg/m.  General Appearance: fairly groomed   Eye Contact:  Good  Speech:  Normal Rate  Volume:  Normal  Mood: remains depressed, some improvement compared to admission  Affect:  Constricted and anxious   Thought Process:  Linear  Orientation:  Other:  alert and attentive   Thought Content:  Denies hallucinations, no delusions expressed, not internally preoccupied, anxious ruminations   Suicidal Thoughts:  No- at this time denies any active suicidal ideations or self injurious ideations, and contracts for safety on  unit   Homicidal Thoughts:  No no homicidal or violent ideations   Memory:  recent and remote grossly intact   Judgement:  improving   Insight:  Improving   Psychomotor Activity:   More visible on unit today  Concentration:  Concentration: Good and Attention Span: Good- of note, yesterday scored 30/30 on MMSE   Recall:  Good  Fund of Knowledge:  Good  Language:  Good  Akathisia:  Negative  Handed:  Right  AIMS (if indicated):     Assets:  Desire for Improvement Resilience  ADL's:  Intact  Cognition:  WNL  Sleep:  Number of Hours: 6.75   Assessment - patient reports ongoing depression, sadness, and low motivation, energy. Also worries about subjective decrease in cognitive abilities, feels slowed . Of note, scored 30/30 in MMSE and a head MRI was reported as negative . We have reviewed poor concentration, difficulty making decisions, and decreased sense of self worth as symptoms of depression . At this time denies SI, and expresses hope he will continue to improve. No psychotic symptoms. Thus far tolerating medications well .    Treatment Plan Summary: Daily contact with patient to assess and evaluate symptoms and progress in treatment, Medication management, Plan inpatient admission  and medications as below  Encourage group , milieu participation to work on coping skills and symptom reduction  Start wellbutrin xl 150 mg daily Start zoloft 25 mg daily Continue   Klonopin   0.5 mgrs BID for anxiety DC  Prozac 40 mgrs QDAY for depression, anxiety Continue  Neurontin 200 mgrs TID for ongoing anxiety, pain.  Continue Vistaril 25 mgrs Q 6 hours PRN for anxiety as needed Continue Remeron  15  mgrs QHS for depression and insomnia  Treatment team working on disposition planning options  Check Vitamin D, B12, Folate serum levels ( patient reports poor PO intake prior to admission, worries about potential vitamin deficiency contributing to symptoms)   Ruffin Frederick, MD 06/23/2016, 10:28  AMPatient ID: Christopher Giles, male   DOB: [birth year], 50 y.o.   MRN: [id number] Patient ID: Christopher Giles, male   DOB: [birth year], 50 y.o.   MRN: [id number]

## 2016-06-23 NOTE — [facility] Suicide Risk Assessment (Signed)
[facility] INPATIENT:  Family/Significant Other Suicide Prevention Education  Suicide Prevention Education:  Education Completed; Evert Kohlnne Aldridge, Pt's sister [phone], has been identified by the patient as the family member/significant other with whom the patient will be residing, and identified as the person(s) who will aid the patient in the event of a mental health crisis (suicidal ideations/suicide attempt).  With written consent from the patient, the family member/significant other has been provided the following suicide prevention education, prior to the and/or following the discharge of the patient.  The suicide prevention education provided includes the following:  Suicide risk factors  Suicide prevention and interventions  National Suicide Hotline telephone number  [facility] assessment telephone number  [facility] Emergency Assistance 911  [facility] and/or Residential Mobile Crisis Unit telephone number  Request made of family/significant other to:  Remove weapons (e.g., guns, rifles, knives), all items previously/currently identified as safety concern.    Remove drugs/medications (over-the-counter, prescriptions, illicit drugs), all items previously/currently identified as a safety concern.  The family member/significant other verbalizes understanding of the suicide prevention education information provided.  The family member/significant other agrees to remove the items of safety concern listed above.  Elaina HoopsLauren M Carter 06/23/2016, 3:42 PM

## 2016-06-23 NOTE — Progress Notes (Signed)
Recreation Therapy Notes  Date: 06/23/16 Time: 0930 Location: 300 Hall Dayroom  Group Topic: Stress Management  Goal Area(s) Addresses:  Patient will verbalize importance of using healthy stress management.  Patient will identify positive emotions associated with healthy stress management.   Intervention: Stress Management  Activity :  Progressive Muscle Relaxation.  LRT introduced the technique of progressive muscle relaxation.  LRT explained that the technique allows patients to relax muscle groups one at a time.  LRT read a script to guide the patients through the technique.  Pt were to follow along as LRT read script to engage in technique.  Education:  Stress Management, Discharge Planning.   Education Outcome: Acknowledges edcuation/In group clarification offered/Needs additional education  Clinical Observations/Feedback: Pt did not attend group.    Caroll RancherMarjette Kalonji Zurawski, LRT/CTRS         Lillia AbedLindsay, Keaira Whitehurst A 06/23/2016 1:02 PM

## 2016-06-23 NOTE — [facility] Group Notes (Signed)
Patient attend group. His day was a 5. His goal was tot spend more time out bed. He has no energy. He has enjoy talking to the other patients.

## 2016-06-24 NOTE — Progress Notes (Signed)
D: Pt presents with flat affect and depressed mood. Pt appears disheveled and withdrawn this morning. Pt reports poor sleep last night and ongoing racing thoughts. Pt reports feeling increasingly depressed 10/10 and anxious 10/10. Pt denies any stressor this morning. Pt compliant with taking meds. No side effects to meds verbalized by pt.  A: Medications administered as ordered per MD. Verbal support provided. Pt encouraged to attend groups. 15 minute checks performed for safety. Pt progress discussed in treatment team this morning. R: Pt compliant with tx.

## 2016-06-24 NOTE — Progress Notes (Signed)
Christopher Giles. Christopher Giles had been up and visible in milieu this evening, did attend and participate in evening group activity. Christopher Giles still endorsing on-going anxiety and reports still not feeling much better than when he came in. Christopher Giles still reports feeling in a mental fog that has not lifted yet. Christopher Giles was able to receive all medications without incident and did not verbalize any complaints of pain. A. Support and encouragement provided. R. Safety maintained, will continue to monitor.

## 2016-06-24 NOTE — Progress Notes (Signed)
Adult Psychoeducational Group Note  Date:  06/24/2016 Time:  0845 am  Group Topic/Focus:  Orientation:   The focus of this group is to educate the patient on the purpose and policies of crisis stabilization and provide a format to answer questions about their admission.  The group details unit policies and expectations of patients while admitted.   Participation Level:  Did Not Attend  Participation Quality:    Affect:    Cognitive:    Insight:   Engagement in Group:    Modes of Intervention:    Additional Comments:  Kearstyn Avitia L 06/24/2016, 9:24 AM

## 2016-06-24 NOTE — Progress Notes (Signed)
[facility] MD Progress Note  06/24/2016 1:00 PM Christopher Giles  MRN:  [id number] Subjective:  Patient  cntinues to complain of depressed and rates it a 10/10 ,anxious mood 10/10 and does not think his medications are working even though his sleep and appetite have improved since admission. Patient was discussed with treatment team. Social worker is still working on speaking with [facility] foe ECT referral. Head MRI is normal.    Objective: I have discussed case with treatment team and have met with patient . Patient reported that his mother and Sister both had a good response to wellbutrin and that he personally had a good response to zoloft for several years which had syopped working. Patient" serum vitamin D level is low. Possible meeting with patient ,sister and treatment team tomorrow  Visible on unit, pleasant on approach, no disruptive or agitated behaviors. .    Principal Problem: Major depression, recurrent ([place]) Diagnosis:   Patient Active Problem List   Diagnosis Date Noted  . Severe episode of recurrent major depressive disorder, without psychotic features ([place]) [F33.2]   . Major depression, recurrent ([place]) [F33.9] 06/13/2016   Total Time spent with patient: 25 minutes    Past Medical History:  Past Medical History:  Diagnosis Date  . Anxiety   . Depression   . Substance abuse   . Suicide attempt    x 6 per pt  . Thyroid disease    hypo    Past Surgical History:  Procedure Laterality Date  . punctured lung     Family History:  Family History  Problem Relation Age of Onset  . Hyperlipidemia Mother   . Cancer Mother   . Cancer Father   . Diabetes Maternal Grandmother     Social History:  History  Alcohol Use No     History  Drug Use No    Social History   Social History  . Marital status: Single    Spouse name: N/A  . Number of children: N/A  . Years of education: N/A   Social History Main Topics  . Smoking status: Current Every Day Smoker    Packs/day:  1.00    Types: Cigarettes  . Smokeless tobacco: Never Used  . Alcohol use No  . Drug use: No  . Sexual activity: Not Asked   Other Topics Concern  . None   Social History Narrative  . None   Additional Social History:   Sleep: gradually improving   Appetite:  Fair  Current Medications: Current Facility-Administered Medications  Medication Dose Route Frequency Provider Last Rate Last Dose  . acetaminophen (TYLENOL) tablet 650 mg  650 mg Oral Q6H PRN Rozetta Nunnery, NP      . alum & mag hydroxide-simeth (MAALOX/MYLANTA) 200-200-20 MG/5ML suspension 30 mL  30 mL Oral Q4H PRN Rozetta Nunnery, NP   30 mL at 06/22/16 1409  . buPROPion (WELLBUTRIN XL) 24 hr tablet 150 mg  150 mg Oral Daily Sueanne Margarita, MD   150 mg at 06/24/16 0754  . cholecalciferol (VITAMIN D) tablet 400 Units  400 Units Oral Daily Sueanne Margarita, MD   400 Units at 06/24/16 0754  . clonazePAM (KLONOPIN) tablet 0.5 mg  0.5 mg Oral BID Jenne Campus, MD   0.5 mg at 06/24/16 0754  . gabapentin (NEURONTIN) capsule 200 mg  200 mg Oral TID Jenne Campus, MD   200 mg at 06/24/16 0754  . hydrOXYzine (ATARAX/VISTARIL) tablet 25 mg  25 mg Oral  Q6H PRN Rozetta Nunnery, NP   25 mg at 06/22/16 1959  . levothyroxine (SYNTHROID, LEVOTHROID) tablet 75 mcg  75 mcg Oral QAC breakfast Jenne Campus, MD   75 mcg at 06/24/16 0608  . magnesium citrate solution 1 Bottle  1 Bottle Oral Once PRN Myer Peer Cobos, MD      . magnesium hydroxide (MILK OF MAGNESIA) suspension 30 mL  30 mL Oral Daily PRN Rozetta Nunnery, NP   30 mL at 06/22/16 1546  . mirtazapine (REMERON) tablet 15 mg  15 mg Oral QHS Jenne Campus, MD   15 mg at 06/23/16 2111  . nicotine (NICODERM CQ - dosed in mg/24 hours) patch 21 mg  21 mg Transdermal Daily Jenne Campus, MD   21 mg at 06/24/16 0754  . sertraline (ZOLOFT) tablet 25 mg  25 mg Oral Daily Sueanne Margarita, MD   25 mg at 06/24/16 0754  . tamsulosin (FLOMAX) capsule 0.4 mg  0.4 mg Oral QHS Jenne Campus, MD   0.4 mg at 06/23/16 2111    Lab Results:  No results found for this or any previous visit (from the past 48 hour(s)).  Blood Alcohol level:  Lab Results  Component Value Date   ETH <5 38/93/7342    Metabolic Disorder Labs: No results found for: HGBA1C, MPG No results found for: PROLACTIN No results found for: CHOL, TRIG, HDL, CHOLHDL, VLDL, LDLCALC  Physical Findings: AIMS: Facial and Oral Movements Muscles of Facial Expression: None, normal Lips and Perioral Area: None, normal Jaw: None, normal Tongue: None, normal,Extremity Movements Upper (arms, wrists, hands, fingers): None, normal Lower (legs, knees, ankles, toes): None, normal, Trunk Movements Neck, shoulders, hips: None, normal, Overall Severity Severity of abnormal movements (highest score from questions above): None, normal Incapacitation due to abnormal movements: None, normal Patient's awareness of abnormal movements (rate only patient's report): No Awareness, Dental Status Current problems with teeth and/or dentures?: No Does patient usually wear dentures?: No  CIWA:  CIWA-Ar Total: 1 COWS:  COWS Total Score: 1  Musculoskeletal: Strength & Muscle Tone: within normal limits Gait & Station: normal Patient leans: N/A  Psychiatric Specialty Exam: Physical Exam  ROS dizziness,  no chest pain or shortness of breath , no vomiting   Blood pressure (!) 91/59, pulse 87, temperature 97.4 F (36.3 C), temperature source Oral, resp. rate 18, height '6\' 3"'  (1.905 m), weight 88.9 kg (196 lb).Body mass index is 24.5 kg/m.  General Appearance: fairly groomed   Eye Contact:  Good  Speech:  Normal Rate  Volume:  Normal  Mood: remains depressed, some improvement compared to admission  Affect:  Constricted and anxious   Thought Process:  Linear  Orientation:  Other:  alert and attentive   Thought Content:  Denies hallucinations, no delusions expressed, not internally preoccupied, anxious ruminations   Suicidal  Thoughts:  No- at this time denies any active suicidal ideations or self injurious ideations, and contracts for safety on unit   Homicidal Thoughts:  No no homicidal or violent ideations   Memory:  recent and remote grossly intact   Judgement:  improving   Insight:  Improving   Psychomotor Activity:   More visible on unit today  Concentration:  Concentration: Good and Attention Span: Good- of note, yesterday scored 30/30 on MMSE   Recall:  Good  Fund of Knowledge:  Good  Language:  Good  Akathisia:  Negative  Handed:  Right  AIMS (if indicated):  Assets:  Desire for Improvement Resilience  ADL's:  Intact  Cognition:  WNL  Sleep:  Number of Hours: 6.25   Assessment - patient reports ongoing depression, sadness, and low motivation, energy. Also worries about subjective decrease in cognitive abilities, feels slowed . Of note, scored 30/30 in MMSE and a head MRI was reported as negative . We have reviewed poor concentration, difficulty making decisions, and decreased sense of self worth as symptoms of depression . At this time denies SI, and expresses hope he will continue to improve. No psychotic symptoms. Thus far tolerating medications well .    Treatment Plan Summary: Daily contact with patient to assess and evaluate symptoms and progress in treatment, Medication management, Plan inpatient admission  and medications as below  Encourage group , milieu participation to work on coping skills and symptom reduction  Continue wellbutrin xl 150 mg daily Continue zoloft 25 mg daily Continue   Klonopin   0.5 mgrs BID for anxiety DC  Prozac 40 mgrs QDAY for depression, anxiety Continue  Neurontin 200 mgrs TID for ongoing anxiety, pain.  Continue Vistaril 25 mgrs Q 6 hours PRN for anxiety as needed Continue Remeron  15  mgrs QHS for depression and insomnia  Treatment team working on disposition planning options  Check Vitamin D, B12, Folate serum levels ( patient reports poor PO intake  prior to admission, worries about potential vitamin deficiency contributing to symptoms)   Ruffin Frederick, MD 06/24/2016, 1:00 PMPatient ID: Rob Bunting, male   DOB: [birth year], 50 y.o.   MRN: [id number] Patient ID: RAMONA SLINGER, male   DOB: [birth year], 50 y.o.   MRN: [id number]

## 2016-06-24 NOTE — Progress Notes (Signed)
Nutrition Education Note  Pt attended group focusing on general, healthful nutrition education.  RD emphasized the importance of eating regular meals and snacks throughout the day. Consuming sugar-free beverages and incorporating fruits and vegetables into diet when possible. Provided examples of healthy snacks. Patient encouraged to leave group with a goal to improve nutrition/healthy eating.   Diet Order: Diet regular Room service appropriate?: Yes; Fluid consistency:: Thin Pt is also offered choice of unit snacks mid-morning and mid-afternoon.  Pt is eating as desired.   If additional nutrition issues arise, please consult RD.  Sheralee Qazi, MS, RD, LDN Pager: [phone] After Hours Pager: [phone]     

## 2016-06-24 NOTE — [facility] Group Notes (Signed)
[facility] 06/24/2016 1:15pm  Type of Therapy: Mental Health Association Presentation  Participation Level: Active  Participation Quality: Attentive  Affect: Appropriate  Cognitive: Oriented  Insight: Developing/Improving  Engagement in Therapy: Engaged  Modes of Intervention: Discussion, Education and Socialization  Summary of Progress/Problems: Mental Health Association (MHA) Speaker came to talk about his personal journey with substance abuse and addiction. The pt processed ways by which to relate to the speaker. MHA speaker provided handouts and educational information pertaining to groups and services offered by the [facility]. Pt was engaged in speaker's presentation and was receptive to resources provided.    Chad CordialLauren Carter, LCSWA 06/24/2016 1:45 PM

## 2016-06-25 MED ORDER — SERTRALINE HCL 50 MG PO TABS
50.0000 mg | ORAL_TABLET | Freq: Every day | ORAL | Status: DC
  Administered 2016-06-26 – 2016-07-01 (×6): 50 mg via ORAL
  Filled 2016-06-25 (×7): qty 1

## 2016-06-25 MED ORDER — BUPROPION HCL ER (XL) 300 MG PO TB24
300.0000 mg | ORAL_TABLET | Freq: Every day | ORAL | Status: DC
  Administered 2016-06-26 – 2016-06-30 (×5): 300 mg via ORAL
  Filled 2016-06-25 (×7): qty 1

## 2016-06-25 NOTE — Plan of Care (Signed)
Problem: Safety: Goal: Periods of time without injury will increase Outcome: Progressing Pt. remains a low fall risk, denies SI/HI at this time, Q 15 checks observation.    

## 2016-06-25 NOTE — Progress Notes (Signed)
[facility] MD Progress Note  06/25/2016 12:13 PM Christopher Giles  MRN:  [id number] Subjective:  Patient  cntinues to complain of depressed  And anxious mood but no suicide ideations.  Patient was discussed with treatment team. Social worker is still waiting for a response from [facility] regarding ECT. We are however continuing to treat patient's depression aggressively w Head MRI is normal.    Objective: I have discussed case with treatment team and have met with patient . Patient reported that his mother and Sister both had a good response to wellbutrin and that he personally had a good response to zoloft for several years which had syopped working. Patient" serum vitamin D level is low. The Education officer, museum and I met with patient, sister and brother in law, provided them with update regarding patient's treatment thus far, provided them with psycho/medication education regarding patients illness and. obtained information from them. Basically they made it clear that neither the patient's mother's home  nor theirs  is a housing option for patient upon discharge. Patient does not want to go to the [facility] because they are religious but he is agreeable to remain in the area and go to a program that has 12 step philosophy upon discharge  Visible on unit, pleasant on approach, no disruptive or agitated behaviors. .    Principal Problem: Major depression, recurrent ([place]) Diagnosis:   Patient Active Problem List   Diagnosis Date Noted  . Severe episode of recurrent major depressive disorder, without psychotic features ([place]) [F33.2]   . Major depression, recurrent ([place]) [F33.9] 06/13/2016   Total Time spent with patient: 35 minutes    Past Medical History:  Past Medical History:  Diagnosis Date  . Anxiety   . Depression   . Substance abuse   . Suicide attempt    x 6 per pt  . Thyroid disease    hypo    Past Surgical History:  Procedure Laterality Date  . punctured lung     Family  History:  Family History  Problem Relation Age of Onset  . Hyperlipidemia Mother   . Cancer Mother   . Cancer Father   . Diabetes Maternal Grandmother     Social History:  History  Alcohol Use No     History  Drug Use No    Social History   Social History  . Marital status: Single    Spouse name: N/A  . Number of children: N/A  . Years of education: N/A   Social History Main Topics  . Smoking status: Current Every Day Smoker    Packs/day: 1.00    Types: Cigarettes  . Smokeless tobacco: Never Used  . Alcohol use No  . Drug use: No  . Sexual activity: Not Asked   Other Topics Concern  . None   Social History Narrative  . None   Additional Social History:   Sleep: gradually improving   Appetite:  Fair  Current Medications: Current Facility-Administered Medications  Medication Dose Route Frequency Provider Last Rate Last Dose  . acetaminophen (TYLENOL) tablet 650 mg  650 mg Oral Q6H PRN Rozetta Nunnery, NP      . alum & mag hydroxide-simeth (MAALOX/MYLANTA) 200-200-20 MG/5ML suspension 30 mL  30 mL Oral Q4H PRN Rozetta Nunnery, NP   30 mL at 06/22/16 1409  . buPROPion (WELLBUTRIN XL) 24 hr tablet 150 mg  150 mg Oral Daily Sueanne Margarita, MD   150 mg at 06/25/16 1125  . cholecalciferol (  VITAMIN D) tablet 400 Units  400 Units Oral Daily Sueanne Margarita, MD   400 Units at 06/25/16 1124  . clonazePAM (KLONOPIN) tablet 0.5 mg  0.5 mg Oral BID Jenne Campus, MD   0.5 mg at 06/25/16 1127  . gabapentin (NEURONTIN) capsule 200 mg  200 mg Oral TID Jenne Campus, MD   200 mg at 06/25/16 1125  . hydrOXYzine (ATARAX/VISTARIL) tablet 25 mg  25 mg Oral Q6H PRN Rozetta Nunnery, NP   25 mg at 06/22/16 1959  . levothyroxine (SYNTHROID, LEVOTHROID) tablet 75 mcg  75 mcg Oral QAC breakfast Jenne Campus, MD   75 mcg at 06/25/16 [id number]  . magnesium citrate solution 1 Bottle  1 Bottle Oral Once PRN Myer Peer Cobos, MD      . magnesium hydroxide (MILK OF MAGNESIA) suspension 30 mL   30 mL Oral Daily PRN Rozetta Nunnery, NP   30 mL at 06/22/16 1546  . mirtazapine (REMERON) tablet 15 mg  15 mg Oral QHS Jenne Campus, MD   15 mg at 06/24/16 2149  . nicotine (NICODERM CQ - dosed in mg/24 hours) patch 21 mg  21 mg Transdermal Daily Jenne Campus, MD   21 mg at 06/25/16 1125  . sertraline (ZOLOFT) tablet 25 mg  25 mg Oral Daily Sueanne Margarita, MD   25 mg at 06/25/16 1125  . tamsulosin (FLOMAX) capsule 0.4 mg  0.4 mg Oral QHS Jenne Campus, MD   0.4 mg at 06/24/16 2149    Lab Results:  No results found for this or any previous visit (from the past 48 hour(s)).  Blood Alcohol level:  Lab Results  Component Value Date   ETH <5 91/69/4503    Metabolic Disorder Labs: No results found for: HGBA1C, MPG No results found for: PROLACTIN No results found for: CHOL, TRIG, HDL, CHOLHDL, VLDL, LDLCALC  Physical Findings: AIMS: Facial and Oral Movements Muscles of Facial Expression: None, normal Lips and Perioral Area: None, normal Jaw: None, normal Tongue: None, normal,Extremity Movements Upper (arms, wrists, hands, fingers): None, normal Lower (legs, knees, ankles, toes): None, normal, Trunk Movements Neck, shoulders, hips: None, normal, Overall Severity Severity of abnormal movements (highest score from questions above): None, normal Incapacitation due to abnormal movements: None, normal Patient's awareness of abnormal movements (rate only patient's report): No Awareness, Dental Status Current problems with teeth and/or dentures?: No Does patient usually wear dentures?: No  CIWA:  CIWA-Ar Total: 1 COWS:  COWS Total Score: 1  Musculoskeletal: Strength & Muscle Tone: within normal limits Gait & Station: normal Patient leans: N/A  Psychiatric Specialty Exam: Physical Exam  ROS dizziness,  no chest pain or shortness of breath , no vomiting   Blood pressure 97/72, pulse 87, temperature 97.5 F (36.4 C), temperature source Oral, resp. rate 18, height 6' 3" (1.905  m), weight 88.9 kg (196 lb).Body mass index is 24.5 kg/m.  General Appearance: fairly groomed   Eye Contact:  Good  Speech:  Normal Rate  Volume:  Normal  Mood: remains depressed, some improvement compared to admission  Affect:  Constricted and anxious   Thought Process:  Linear  Orientation:  Other:  alert and attentive   Thought Content:  Denies hallucinations, no delusions expressed, not internally preoccupied, anxious ruminations   Suicidal Thoughts:  No- at this time denies any active suicidal ideations or self injurious ideations, and contracts for safety on unit   Homicidal Thoughts:  No no homicidal or violent  ideations   Memory:  recent and remote grossly intact   Judgement:  improving   Insight:  Improving   Psychomotor Activity:   More visible on unit today  Concentration:  Concentration: Good and Attention Span: Good- of note, yesterday scored 30/30 on MMSE   Recall:  Good  Fund of Knowledge:  Good  Language:  Good  Akathisia:  Negative  Handed:  Right  AIMS (if indicated):     Assets:  Desire for Improvement Resilience  ADL's:  Intact  Cognition:  WNL  Sleep:  Number of Hours: 6.75   Assessment - patient reports ongoing depression, sadness, and low motivation, energy. Also worries about subjective decrease in cognitive abilities, feels slowed . Of note, scored 30/30 in MMSE and a head MRI was reported as negative . We have reviewed poor concentration, difficulty making decisions, and decreased sense of self worth as symptoms of depression . At this time denies SI, and expresses hope he will continue to improve. No psychotic symptoms. Thus far tolerating medications well .    Treatment Plan Summary: Daily contact with patient to assess and evaluate symptoms and progress in treatment, Medication management, Plan inpatient admission  and medications as below  Encourage group , milieu participation to work on coping skills and symptom reduction  Increase  wellbutrin xl  300 mg daily Increase zoloft 50 mg daily Continue   Klonopin   0.5 mgrs BID for anxiety DC  Prozac 40 mgrs QDAY for depression, anxiety Continue  Neurontin 200 mgrs TID for ongoing anxiety, pain.  Continue Vistaril 25 mgrs Q 6 hours PRN for anxiety as needed Continue Remeron  15  mgrs QHS for depression and insomnia  Treatment team working on disposition planning options  Check Vitamin D, B12, Folate serum levels ( patient reports poor PO intake prior to admission, worries about potential vitamin deficiency contributing to symptoms)   Ruffin Frederick, MD 06/25/2016, 12:13 PMPatient ID: Christopher Giles, male   DOB: 1965/10/18, 50 y.o.   MRN: [id number] Patient ID: Christopher Giles, male   DOB: 24-Nov-1965, 50 y.o.   MRN: [id number]

## 2016-06-25 NOTE — Progress Notes (Signed)
D Pt stayed in his bed most of the morning today.He kept his eyes shut, was still and did not awaken when this writer went to his bedside with his morning medications. AWhen he did get OOB, he went directly to his  Counselor ( for [employer] planning meeting). Marland Kitchen. He is unkempt, his hair is dirty and messed up, he has a beard, he gets OOB and is seen wearing his red hospital-issued scrubs. He completed his daily assessment at 1500 today. on it he wrote he deneid SI today and he rated his depression, hopelessness and anxeity " 06/22/09", respectively.  R To cont to encourage pt participation in his recovery.

## 2016-06-25 NOTE — Progress Notes (Signed)
Recreation Therapy Notes  Date: 06/25/16 Time: 0930 Location: [address] Dayroom  Group Topic: Stress Management  Goal Area(s) Addresses:  Patient will verbalize importance of using healthy stress management.  Patient will identify positive emotions associated with healthy stress management.   Intervention: Guided Imagery Script  Activity :  Forest Visualization.  LRT introduced the stress management technique of guided imagery.  LRT read a script that allowed patients to participate in the activity.  Patients were to follow along as LRT read script.    Education:  Stress Management, Discharge Planning.   Education Outcome: Acknowledges edcuation/In group clarification offered/Needs additional education  Clinical Observations/Feedback: Pt did not attend group.     Brihany Butch, LRT/CTRS         Fabrizio Filip A 06/25/2016 12:43 PM 

## 2016-06-25 NOTE — Progress Notes (Signed)
  D: When asked about his day pt stated, "I just found out I have no place to go when [place] Stated his sister informed him that he couldn't return to her home. Pt stated, "I understand. They have too much to deal with already". Stated, that his sister had "higher expectations" of him than he could handle especially being depressed. Pt has no other questions or concerns.    A:  Support and encouragement was offered. 15 min checks continued for safety.  R: Pt remains safe.

## 2016-06-25 NOTE — Progress Notes (Signed)
D: Pt. is up and visible in the milieu, interacting with peers. Denies having any SI/HI/AVH at this time. Pt. rates pain as 5/10 for headache. Pt. presents with a irritable/depressed/anxious affect and mood. Pt. states  "when do I get to go home". Pt. is labile and emotionally unstable at times. Pt. had to be directed by staff on behavior at times due to profanity of language that was used in the milieu.   A: Encouragement and support given. Meds. ordered and given. PRN Tylenol requested and given. Will re-eval as necessary.   R: Safety maintained with Q 15 checks. Continues to follow treatment plan and will monitor closely. No addtional questions/concerns at this time.

## 2016-06-26 DIAGNOSIS — Z833 Family history of diabetes mellitus: Secondary | ICD-10-CM

## 2016-06-26 DIAGNOSIS — F331 Major depressive disorder, recurrent, moderate: Secondary | ICD-10-CM

## 2016-06-26 NOTE — Progress Notes (Signed)
Adult Psychoeducational Group Note  Date:  06/26/2016 Time:  9:42 PM  Group Topic/Focus:  Wrap-Up Group:   The focus of this group is to help patients review their daily goal of treatment and discuss progress on daily workbooks.   Participation Level:  Active  Participation Quality:  Appropriate and Attentive  Affect:  Appropriate  Cognitive:  Alert and Appropriate  Insight: Appropriate  Engagement in Group:  Engaged  Modes of Intervention:  Discussion  Additional Comments:  Pt was attentive and appropriate during tonights group discussion. Pt shared that everything worked out and he was able to stay at apartment.  Bing PlumeScott, [name] 06/26/2016, 9:42 PM

## 2016-06-26 NOTE — [facility] Group Notes (Signed)
[facility] Group Notes:  (Nursing/MHT/Case Management/Adjunct)  Date:  06/26/2016  Time:  4:03 PM  Type of Therapy:  Psychoeducational Skills  Participation Level:  Minimal  Participation Quality:  Resistant  Affect:  Defensive  Cognitive:  Appropriate  Insight:  Lacking  Engagement in Group:  Engaged  Modes of Intervention:  Discussion and Education  Summary of Progress/Problems: [name] attended group. The topic of this group is life skills.   Marzetta BoardDopson, Nyasha Rahilly E 06/26/2016, 4:03 PM

## 2016-06-26 NOTE — [facility] Group Notes (Signed)
Patient attend group. His day was a 6. His goal stay out his room to interact with patients more. Patient said he accomplish his goal.

## 2016-06-26 NOTE — Progress Notes (Addendum)
Christopher Giles is clearly depressed today. This is evidenced by #1- he returned to sleep immediately after eating breakfast in the cafe' this morning #2- he is agitated and irritable at med pass and makes these statements " is that all there is for me to take?Marland Kitchen.... Am I done here? I want to go back to sleep..." " I don't have any place to go .It doesn't matter anyway..I   can't pay for these medications so I won't be taking them after I get discharged ". #3- he avoids eye contact   #4- he has not bathed today, shaved , and / or put his clothes on and  #5- he has not completed his daily assessment. A he is asleep now, having missed the first two groups today.He completed his daily assessment and on it he wrote he denied SI today and he rated his depression, hopelessness and anxiety " 8/9/8", respectively. He wrote on his assessment  That his goal is to " stay in the dayroom". He approached this Clinical research associatewriter and asked to speak to this nurse. He explained he is quite worried and ruminating over needing to contact his landlord...find out if he will /will not be evicted and if landlord can " work  With me" until I can get a job so I can pay my rent'. He is encouraged to stay  In the now and to not borrow trouble. Encouraged to keep deep=-breathing.  1830 Christopher Giles came to this Clinical research associatewriter and shared he had been in touch with his land lords wife, who confirmed for him that he had NOT been evicted thus far, that he  Needed to speak to her husband ( his landlord who was unavailable) and that he needed to call back Monday evening. Christopher Giles becasme emotional and tearful as he retold this conversation and he admitted that " I am my own worst enemy". Positive reniforcement offered.

## 2016-06-26 NOTE — Progress Notes (Signed)
[facility] MD Progress Note  06/26/2016 11:27 AM Christopher Giles  MRN:  [id number]   Subjective:  Patient states that he is doing alright.  He states that he is tired but denied poor sleep.  He reports that he attends the groups.  No adverse effects on the meds.  "I'm just seeing if the increase will work."  Patient reports that provider had increased the dose.  He also reports that Prozac did not really work for him and that his Wellbutrin better improved his moods.    Objective: I have discussed case with treatment team and have met with patient .  Visible on unit, pleasant on approach, no disruptive or agitated behaviors.  CSW will continue to work with patient's discharge disposition.  No disruptive behaviors on unit.  Interacts well on the unit with fellow peers and with staff.   .    Principal Problem: Major depression, recurrent ([place]) Diagnosis:   Patient Active Problem List   Diagnosis Date Noted  . Severe episode of recurrent major depressive disorder, without psychotic features ([place]) [F33.2]   . Major depression, recurrent ([place]) [F33.9] 06/13/2016   Total Time spent with patient: 35 minutes    Past Medical History:  Past Medical History:  Diagnosis Date  . Anxiety   . Depression   . Substance abuse   . Suicide attempt    x 6 per pt  . Thyroid disease    hypo    Past Surgical History:  Procedure Laterality Date  . punctured lung     Family History:  Family History  Problem Relation Age of Onset  . Hyperlipidemia Mother   . Cancer Mother   . Cancer Father   . Diabetes Maternal Grandmother     Social History:  History  Alcohol Use No     History  Drug Use No    Social History   Social History  . Marital status: Single    Spouse name: N/A  . Number of children: N/A  . Years of education: N/A   Social History Main Topics  . Smoking status: Current Every Day Smoker    Packs/day: 1.00    Types: Cigarettes  . Smokeless tobacco: Never Used  . Alcohol use No   . Drug use: No  . Sexual activity: Not Asked   Other Topics Concern  . None   Social History Narrative  . None   Additional Social History:   Sleep: gradually improving   Appetite:  Fair  Current Medications: Current Facility-Administered Medications  Medication Dose Route Frequency Provider Last Rate Last Dose  . acetaminophen (TYLENOL) tablet 650 mg  650 mg Oral Q6H PRN Rozetta Nunnery, NP   650 mg at 06/25/16 1951  . alum & mag hydroxide-simeth (MAALOX/MYLANTA) 200-200-20 MG/5ML suspension 30 mL  30 mL Oral Q4H PRN Rozetta Nunnery, NP   30 mL at 06/25/16 1813  . buPROPion (WELLBUTRIN XL) 24 hr tablet 300 mg  300 mg Oral Daily Sueanne Margarita, MD   300 mg at 06/26/16 0839  . cholecalciferol (VITAMIN D) tablet 400 Units  400 Units Oral Daily Sueanne Margarita, MD   400 Units at 06/25/16 1124  . clonazePAM (KLONOPIN) tablet 0.5 mg  0.5 mg Oral BID Jenne Campus, MD   0.5 mg at 06/26/16 0842  . gabapentin (NEURONTIN) capsule 200 mg  200 mg Oral TID Jenne Campus, MD   200 mg at 06/26/16 0839  . hydrOXYzine (ATARAX/VISTARIL) tablet 25 mg  25 mg Oral Q6H PRN Rozetta Nunnery, NP   25 mg at 06/22/16 1959  . levothyroxine (SYNTHROID, LEVOTHROID) tablet 75 mcg  75 mcg Oral QAC breakfast Jenne Campus, MD   75 mcg at 06/26/16 1740  . magnesium citrate solution 1 Bottle  1 Bottle Oral Once PRN Myer Peer Cobos, MD      . magnesium hydroxide (MILK OF MAGNESIA) suspension 30 mL  30 mL Oral Daily PRN Rozetta Nunnery, NP   30 mL at 06/22/16 1546  . mirtazapine (REMERON) tablet 15 mg  15 mg Oral QHS Jenne Campus, MD   15 mg at 06/25/16 2145  . nicotine (NICODERM CQ - dosed in mg/24 hours) patch 21 mg  21 mg Transdermal Daily Jenne Campus, MD   21 mg at 06/26/16 0840  . sertraline (ZOLOFT) tablet 50 mg  50 mg Oral Daily Sueanne Margarita, MD   50 mg at 06/26/16 0839  . tamsulosin (FLOMAX) capsule 0.4 mg  0.4 mg Oral QHS Jenne Campus, MD   0.4 mg at 06/25/16 2145    Lab Results:  No  results found for this or any previous visit (from the past 48 hour(s)).  Blood Alcohol level:  Lab Results  Component Value Date   ETH <5 81/44/8185    Metabolic Disorder Labs: No results found for: HGBA1C, MPG No results found for: PROLACTIN No results found for: CHOL, TRIG, HDL, CHOLHDL, VLDL, LDLCALC  Physical Findings: AIMS: Facial and Oral Movements Muscles of Facial Expression: None, normal Lips and Perioral Area: None, normal Jaw: None, normal Tongue: None, normal,Extremity Movements Upper (arms, wrists, hands, fingers): None, normal Lower (legs, knees, ankles, toes): None, normal, Trunk Movements Neck, shoulders, hips: None, normal, Overall Severity Severity of abnormal movements (highest score from questions above): None, normal Incapacitation due to abnormal movements: None, normal Patient's awareness of abnormal movements (rate only patient's report): No Awareness, Dental Status Current problems with teeth and/or dentures?: No Does patient usually wear dentures?: No  CIWA:  CIWA-Ar Total: 1 COWS:  COWS Total Score: 1  Musculoskeletal: Strength & Muscle Tone: within normal limits Gait & Station: normal Patient leans: N/A  Psychiatric Specialty Exam: Physical Exam  Nursing note and vitals reviewed. Psychiatric: His speech is normal and behavior is normal. Judgment and thought content normal. Cognition and memory are normal. He exhibits a depressed mood.    Review of Systems  Constitutional: Negative.   HENT: Negative.   Eyes: Negative.   Respiratory: Negative.   Cardiovascular: Negative.   Gastrointestinal: Negative.   Genitourinary: Negative.   Musculoskeletal: Negative.   Skin: Negative.   Neurological: Negative.   Endo/Heme/Allergies: Negative.   Psychiatric/Behavioral: Negative.   All other systems reviewed and are negative.  dizziness,  no chest pain or shortness of breath , no vomiting   Blood pressure 99/64, pulse 97, temperature 97.7 F (36.5  C), temperature source Oral, resp. rate 18, height _0  (1.905 m), weight 88.9 kg (196 lb).Body mass index is 24.5 kg/m.  General Appearance: fairly groomed   Eye Contact:  Good  Speech:  Normal Rate  Volume:  Normal  Mood: remains depressed, some improvement compared to admission  Affect:  Constricted and anxious   Thought Process:  Linear  Orientation:  Other:  alert and attentive   Thought Content:  Denies hallucinations, no delusions expressed, not internally preoccupied, anxious ruminations   Suicidal Thoughts:  No- at this time denies any active suicidal ideations or self injurious ideations,  and contracts for safety on unit   Homicidal Thoughts:  No no homicidal or violent ideations   Memory:  recent and remote grossly intact   Judgement:  improving   Insight:  Improving   Psychomotor Activity:   More visible on unit today  Concentration:  Concentration: Good and Attention Span: Good- of note, yesterday scored 30/30 on MMSE   Recall:  Good  Fund of Knowledge:  Good  Language:  Good  Akathisia:  Negative  Handed:  Right  AIMS (if indicated):     Assets:  Desire for Improvement Resilience  ADL's:  Intact  Cognition:  WNL  Sleep:  Number of Hours: 6.75   Assessment - patient reports ongoing depression, sadness, and low motivation, energy. Also worries about subjective decrease in cognitive abilities, feels slowed . Of note, scored 30/30 in MMSE and a head MRI was reported as negative . We have reviewed poor concentration, difficulty making decisions, and decreased sense of self worth as symptoms of depression . At this time denies SI, and expresses hope he will continue to improve. No psychotic symptoms. Thus far tolerating medications well .  Treatment Plan Summary: Daily contact with patient to assess and evaluate symptoms and progress in treatment, Medication management, Plan inpatient admission  and medications as below  Encourage group , milieu participation to work on  coping skills and symptom reduction  Increase  wellbutrin xl 300 mg daily Increase zoloft 50 mg daily Continue   Klonopin   0.5 mgrs BID for anxiety DC  Prozac 40 mgrs - not helpful as patient reported and taking over four weeks Continue  Neurontin 200 mgrs TID for ongoing anxiety, pain.  Continue Vistaril 25 mgrs Q 6 hours PRN for anxiety as needed Continue Remeron  15  mgrs QHS for depression and insomnia  Treatment team working on disposition planning options  Check Vitamin D, B12, Folate serum levels ( patient reports poor PO intake prior to admission, worries about potential vitamin deficiency contributing to symptoms)     Janett Labella, NP [facility] 06/26/2016, 11:27 AM   Reviewed the information documented and agree with the treatment plan.  Minnie Legros [facility] 06/26/2016 2:46 PM

## 2016-06-26 NOTE — [facility] Group Notes (Signed)
[facility] Group Notes: (Clinical Social Work)   06/26/2016      Type of Therapy:  Group Therapy   Participation Level:  Did Not Attend despite MHT prompting   Ambrose MantleMareida Grossman-Orr, LCSW 06/26/2016, 12:54 PM

## 2016-06-27 MED ORDER — GABAPENTIN 400 MG PO CAPS
400.0000 mg | ORAL_CAPSULE | Freq: Three times a day (TID) | ORAL | Status: DC
  Filled 2016-06-27 (×2): qty 1

## 2016-06-27 MED ORDER — GABAPENTIN 400 MG PO CAPS
400.0000 mg | ORAL_CAPSULE | Freq: Three times a day (TID) | ORAL | Status: DC
  Administered 2016-06-27 – 2016-06-29 (×6): 400 mg via ORAL
  Filled 2016-06-27 (×12): qty 1

## 2016-06-27 NOTE — Progress Notes (Signed)
[name] is at the nurses station immediately after returnin gto the unit ( from eating breakfast in the cafe' this morning). He makes eye contact with this Clinical research associatewriter. He says " hi..  I have my medicines yet?". He is unkempt, has BO and wears old dirty purple scrubs. He takes all of his meds as scheduled, talking nervously , mildly pressured as he reviews what his previous evenign was like...how his mood was...how he feels currently. .he says " ...but let me quit boring you with details about my life...how are you today/". He completed his daily assessment andnon it he wrote he deneid SI  And he rated his depression, hopelessness and anxiety " 8/6/8", respectively. Writer gives him clean scrubs, toiletries and clean towels, so he can bathe. He says " I know I need to bathe. And I think to myslef every mornign when I get up, Christopher Giles.. ur shower...but I don't ever seem to make it there....don't you know, its part of my sickness!". This Clinical research associatewriter encouraged pt to follow thorough with is bathing plan. Writer pointed out 3 tangile successes of patient's ( for Saturday): he did not have meldown, he went to groups and he called his landlord. R Safety in place and poc includes continuing to foster therapeutic relationship.

## 2016-06-27 NOTE — Progress Notes (Signed)
[facility] MD Progress Note  06/27/2016 3:23 PM Christopher Giles  MRN:  [id number]   Subjective:  Patient states that he is doing alright.  His anxiety has increased.  He also reported that he had spoken to place apt he used to live and he may be able to get his housing back.   Patient continues to ruminate about his depression, anxiety and bad fortune.  Spent time with patient and encouraged him.    Objective: I have discussed case with treatment team and have met with patient .  Visible on unit, pleasant on approach, no disruptive or agitated behaviors.  CSW will continue to work with patient's discharge disposition.  No disruptive behaviors on unit.  Interacts well on the unit with fellow peers and with staff.   Principal Problem: Major depression, recurrent ([place]) Diagnosis:   Patient Active Problem List   Diagnosis Date Noted  . Severe episode of recurrent major depressive disorder, without psychotic features ([place]) [F33.2]   . Major depression, recurrent ([place]) [F33.9] 06/13/2016   Total Time spent with patient: 35 minutes  Past Medical History:  Past Medical History:  Diagnosis Date  . Anxiety   . Depression   . Substance abuse   . Suicide attempt    x 6 per pt  . Thyroid disease    hypo    Past Surgical History:  Procedure Laterality Date  . punctured lung     Family History:  Family History  Problem Relation Age of Onset  . Hyperlipidemia Mother   . Cancer Mother   . Cancer Father   . Diabetes Maternal Grandmother     Social History:  History  Alcohol Use No     History  Drug Use No    Social History   Social History  . Marital status: Single    Spouse name: N/A  . Number of children: N/A  . Years of education: N/A   Social History Main Topics  . Smoking status: Current Every Day Smoker    Packs/day: 1.00    Types: Cigarettes  . Smokeless tobacco: Never Used  . Alcohol use No  . Drug use: No  . Sexual activity: Not Asked   Other Topics Concern  . None    Social History Narrative  . None   Additional Social History:   Sleep: gradually improving   Appetite:  Fair  Current Medications: Current Facility-Administered Medications  Medication Dose Route Frequency Provider Last Rate Last Dose  . acetaminophen (TYLENOL) tablet 650 mg  650 mg Oral Q6H PRN Rozetta Nunnery, NP   650 mg at 06/25/16 1951  . alum & mag hydroxide-simeth (MAALOX/MYLANTA) 200-200-20 MG/5ML suspension 30 mL  30 mL Oral Q4H PRN Rozetta Nunnery, NP   30 mL at 06/25/16 1813  . buPROPion (WELLBUTRIN XL) 24 hr tablet 300 mg  300 mg Oral Daily Sueanne Margarita, MD   300 mg at 06/27/16 0755  . cholecalciferol (VITAMIN D) tablet 400 Units  400 Units Oral Daily Sueanne Margarita, MD   400 Units at 06/27/16 0755  . clonazePAM (KLONOPIN) tablet 0.5 mg  0.5 mg Oral BID Jenne Campus, MD   0.5 mg at 06/27/16 0756  . gabapentin (NEURONTIN) capsule 200 mg  200 mg Oral TID Jenne Campus, MD   200 mg at 06/27/16 1353  . hydrOXYzine (ATARAX/VISTARIL) tablet 25 mg  25 mg Oral Q6H PRN Rozetta Nunnery, NP   25 mg at 06/27/16 1350  .  levothyroxine (SYNTHROID, LEVOTHROID) tablet 75 mcg  75 mcg Oral QAC breakfast Jenne Campus, MD   75 mcg at 06/27/16 0603  . magnesium citrate solution 1 Bottle  1 Bottle Oral Once PRN Myer Peer Cobos, MD      . magnesium hydroxide (MILK OF MAGNESIA) suspension 30 mL  30 mL Oral Daily PRN Rozetta Nunnery, NP   30 mL at 06/22/16 1546  . mirtazapine (REMERON) tablet 15 mg  15 mg Oral QHS Myer Peer Cobos, MD   15 mg at 06/26/16 2217  . nicotine (NICODERM CQ - dosed in mg/24 hours) patch 21 mg  21 mg Transdermal Daily Jenne Campus, MD   21 mg at 06/27/16 0755  . sertraline (ZOLOFT) tablet 50 mg  50 mg Oral Daily Sueanne Margarita, MD   50 mg at 06/27/16 0755  . tamsulosin (FLOMAX) capsule 0.4 mg  0.4 mg Oral QHS Jenne Campus, MD   0.4 mg at 06/26/16 2217    Lab Results:  No results found for this or any previous visit (from the past 67 hour(s)).  Blood  Alcohol level:  Lab Results  Component Value Date   ETH <5 87/19/5974    Metabolic Disorder Labs: No results found for: HGBA1C, MPG No results found for: PROLACTIN No results found for: CHOL, TRIG, HDL, CHOLHDL, VLDL, LDLCALC  Physical Findings: AIMS: Facial and Oral Movements Muscles of Facial Expression: None, normal Lips and Perioral Area: None, normal Jaw: None, normal Tongue: None, normal,Extremity Movements Upper (arms, wrists, hands, fingers): None, normal Lower (legs, knees, ankles, toes): None, normal, Trunk Movements Neck, shoulders, hips: None, normal, Overall Severity Severity of abnormal movements (highest score from questions above): None, normal Incapacitation due to abnormal movements: None, normal Patient's awareness of abnormal movements (rate only patient's report): No Awareness, Dental Status Current problems with teeth and/or dentures?: No Does patient usually wear dentures?: No  CIWA:  CIWA-Ar Total: 1 COWS:  COWS Total Score: 1  Musculoskeletal: Strength & Muscle Tone: within normal limits Gait & Station: normal Patient leans: N/A  Psychiatric Specialty Exam: Physical Exam  Nursing note and vitals reviewed. Psychiatric: His speech is normal and behavior is normal. Judgment and thought content normal. Cognition and memory are normal. He exhibits a depressed mood.    Review of Systems  Constitutional: Negative.   HENT: Negative.   Eyes: Negative.   Respiratory: Negative.   Cardiovascular: Negative.   Gastrointestinal: Negative.   Genitourinary: Negative.   Musculoskeletal: Negative.   Skin: Negative.   Neurological: Negative.   Endo/Heme/Allergies: Negative.   Psychiatric/Behavioral: Negative.   All other systems reviewed and are negative.  dizziness,  no chest pain or shortness of breath , no vomiting   Blood pressure 102/73, pulse 92, temperature 97.4 F (36.3 C), resp. rate 18, height _0  (1.905 m), weight 88.9 kg (196 lb).Body mass  index is 24.5 kg/m.  General Appearance: fairly groomed   Eye Contact:  Good  Speech:  Normal Rate  Volume:  Normal  Mood: remains depressed, some improvement compared to admission  Affect:  Constricted and anxious   Thought Process:  Linear  Orientation:  Other:  alert and attentive   Thought Content:  Denies hallucinations, no delusions expressed, not internally preoccupied, anxious ruminations   Suicidal Thoughts:  No- at this time denies any active suicidal ideations or self injurious ideations, and contracts for safety on unit   Homicidal Thoughts:  No no homicidal or violent ideations   Memory:  recent and remote grossly intact   Judgement:  improving   Insight:  Improving   Psychomotor Activity:   More visible on unit today  Concentration:  Concentration: Good and Attention Span: Good- of note, yesterday scored 30/30 on MMSE   Recall:  Good  Fund of Knowledge:  Good  Language:  Good  Akathisia:  Negative  Handed:  Right  AIMS (if indicated):     Assets:  Desire for Improvement Resilience  ADL's:  Intact  Cognition:  WNL  Sleep:  Number of Hours: 6.75   Assessment - patient reports ongoing depression, sadness, and low motivation, energy. Also worries about subjective decrease in cognitive abilities, feels slowed . Of note, scored 30/30 in MMSE and a head MRI was reported as negative . We have reviewed poor concentration, difficulty making decisions, and decreased sense of self worth as symptoms of depression . At this time denies SI, and expresses hope he will continue to improve. No psychotic symptoms. Thus far tolerating medications well .  Treatment Plan Summary: Daily contact with patient to assess and evaluate symptoms and progress in treatment, Medication management, Plan inpatient admission  and medications as below    Encourage group , milieu participation to work on coping skills and symptom reduction  Continue  Wellbutrin XL 300 mg daily Continue   Klonopin   0.5  mgrs BID for anxiety DC  Prozac 40 mgrs - not helpful as patient reported and taking over four weeks Increase Neurontin 400 mgrs TID for ongoing anxiety, pain.  Continue Vistaril 25 mgrs Q 6 hours PRN for anxiety as needed Continue Remeron  15  mgrs QHS for depression and insomnia  Treatment team working on disposition planning options  Check Vitamin D, B12, Folate serum levels ( patient reports poor PO intake prior to admission, worries about potential vitamin deficiency contributing to symptoms)     Janett Labella, NP [facility] 06/27/2016, 3:23 PM   Reviewed the information documented and agree with the treatment plan.  Elva Breaker 06/29/2016 11:39 AM

## 2016-06-27 NOTE — [facility] Group Notes (Signed)
Pt attended group. Rules of the unit were explained and pt was asked how his day was. Pt stated his day was a 7 huis anxiety was up and the Dr. Riley Churcheshanged his meds and feels he understand what people are saying.

## 2016-06-27 NOTE — [facility] Group Notes (Signed)
[facility] Group Notes:  (Clinical Social Work)  06/27/2016  10:00-11:00AM  Summary of Progress/Problems:   The main focus of today's process group was to   1)    Identify the patient's current healthy supports   2)    Discuss the effect of automatic thoughts on perpetuating/increasing symptoms of depression and anxiety  3)   Have patients identify some of their automatic negative thoughts  4)   Have patients work to identify strong replacement thoughts and share with the group   The patient expressed full comprehension of the concepts presented, and participated fully in the exercise.  The patient stated a healthy support that will help in recovery is his sister and Alcoholics Anonymous  Type of Therapy:  Process Group with Motivational Interviewing  Participation Level:  Active  Participation Quality:  Attentive, Sharing and Supportive  Affect:  Blunted and Depressed  Cognitive:  Appropriate and Oriented  Insight:  Engaged  Engagement in Therapy:  Engaged  Modes of Intervention:   Education, Support and Processing, Activity  Ambrose MantleMareida Grossman-Orr, LCSW 06/27/2016

## 2016-06-27 NOTE — Progress Notes (Signed)
D: Patient pleasant and cooperative with care this shift and is noted to interact well with peers in the milieu.  A: Encourage staff/peer interaction, medication compliance, and group participation. Administer medications as ordered, maintain Q 15 minute safety checks. R: Pt denies suicidal ideations or plans to harm himself. Pt compliant with HS medications and group session.

## 2016-06-28 MED ORDER — VITAMIN D3 25 MCG (1000 UNIT) PO TABS
1000.0000 [IU] | ORAL_TABLET | Freq: Every day | ORAL | Status: DC
  Administered 2016-06-29 – 2016-07-06 (×8): 1000 [IU] via ORAL
  Filled 2016-06-28 (×5): qty 1
  Filled 2016-06-28: qty 7
  Filled 2016-06-28 (×3): qty 1
  Filled 2016-06-28: qty 7
  Filled 2016-06-28 (×2): qty 1

## 2016-06-28 NOTE — Progress Notes (Signed)
[facility] MD Progress Note  06/28/2016 2:42 PM Christopher Giles  MRN:  [id number]   Subjective:  Patient reports he feels he is making progress. He continues to feel depressed, anxious, but reports partial improvement and in particular feels " like I notice the medications kicking in now", which in turn is helping him feel more hopeful.  Objective: I have discussed case with treatment team and have met with patient .   Patient presents with partial improvement of mood and range of affect. At this time reports ongoing depression, but acknowledges some improvement , and as noted, reports decreased /improving sense of hopelessness . Denies any current suicidal ideations . Today we focused on medications - he is now on Zoloft at 50 mgrs QDAY and on  Wellbutrin XL , which has been titrated to 300 mgrs QDAY - we reviewed medication side effects. He is somewhat worried about potential anxiogenic side effects of Buproprion, but thus far has not noticed any significant increase in anxiety, and states he feels it is helping. He is also on Neurontin which he reports is helping . We reviewed side effect profiles . No agitated or disruptive behaviors on unit. States he is also feeling better because even though very anxious about it, made a call to his landlord to inquire what his status there was, and thinks he may be able to return to his apartment on discharge, because he was told there was no eviction procedure going on at this time.  Principal Problem: Major depression, recurrent ([place]) Diagnosis:   Patient Active Problem List   Diagnosis Date Noted  . Severe episode of recurrent major depressive disorder, without psychotic features ([place]) [F33.2]   . Major depression, recurrent ([place]) [F33.9] 06/13/2016   Total Time spent with patient: 20 minutes   Past Medical History:  Past Medical History:  Diagnosis Date  . Anxiety   . Depression   . Substance abuse   . Suicide attempt    x 6 per pt  . Thyroid  disease    hypo    Past Surgical History:  Procedure Laterality Date  . punctured lung     Family History:  Family History  Problem Relation Age of Onset  . Hyperlipidemia Mother   . Cancer Mother   . Cancer Father   . Diabetes Maternal Grandmother     Social History:  History  Alcohol Use No     History  Drug Use No    Social History   Social History  . Marital status: Single    Spouse name: N/A  . Number of children: N/A  . Years of education: N/A   Social History Main Topics  . Smoking status: Current Every Day Smoker    Packs/day: 1.00    Types: Cigarettes  . Smokeless tobacco: Never Used  . Alcohol use No  . Drug use: No  . Sexual activity: Not Asked   Other Topics Concern  . None   Social History Narrative  . None   Additional Social History:   Sleep: gradually improving   Appetite:  Improving   Current Medications: Current Facility-Administered Medications  Medication Dose Route Frequency Provider Last Rate Last Dose  . acetaminophen (TYLENOL) tablet 650 mg  650 mg Oral Q6H PRN Rozetta Nunnery, NP   650 mg at 06/25/16 1951  . alum & mag hydroxide-simeth (MAALOX/MYLANTA) 200-200-20 MG/5ML suspension 30 mL  30 mL Oral Q4H PRN Rozetta Nunnery, NP   30 mL at 06/25/16 1813  .  buPROPion (WELLBUTRIN XL) 24 hr tablet 300 mg  300 mg Oral Daily Sueanne Margarita, MD   300 mg at 06/28/16 0805  . [START ON 06/29/2016] cholecalciferol (VITAMIN D) tablet 1,000 Units  1,000 Units Oral Daily Myer Peer Farrell Pantaleo, MD      . clonazePAM (KLONOPIN) tablet 0.5 mg  0.5 mg Oral BID Jenne Campus, MD   0.5 mg at 06/28/16 0805  . gabapentin (NEURONTIN) capsule 400 mg  400 mg Oral TID Jenne Campus, MD   400 mg at 06/28/16 0805  . hydrOXYzine (ATARAX/VISTARIL) tablet 25 mg  25 mg Oral Q6H PRN Rozetta Nunnery, NP   25 mg at 06/27/16 1350  . levothyroxine (SYNTHROID, LEVOTHROID) tablet 75 mcg  75 mcg Oral QAC breakfast Jenne Campus, MD   75 mcg at 06/28/16 0608  . magnesium  citrate solution 1 Bottle  1 Bottle Oral Once PRN Myer Peer Dmarcus Decicco, MD      . magnesium hydroxide (MILK OF MAGNESIA) suspension 30 mL  30 mL Oral Daily PRN Rozetta Nunnery, NP   30 mL at 06/22/16 1546  . mirtazapine (REMERON) tablet 15 mg  15 mg Oral QHS Myer Peer Galit Urich, MD   15 mg at 06/27/16 2300  . nicotine (NICODERM CQ - dosed in mg/24 hours) patch 21 mg  21 mg Transdermal Daily Jenne Campus, MD   21 mg at 06/28/16 0807  . sertraline (ZOLOFT) tablet 50 mg  50 mg Oral Daily Sueanne Margarita, MD   50 mg at 06/28/16 0805  . tamsulosin (FLOMAX) capsule 0.4 mg  0.4 mg Oral QHS Myer Peer Jamil Castillo, MD   0.4 mg at 06/27/16 2300    Lab Results:  No results found for this or any previous visit (from the past 48 hour(s)).  Blood Alcohol level:  Lab Results  Component Value Date   ETH <5 14/78/2956    Metabolic Disorder Labs: No results found for: HGBA1C, MPG No results found for: PROLACTIN No results found for: CHOL, TRIG, HDL, CHOLHDL, VLDL, LDLCALC  Physical Findings: AIMS: Facial and Oral Movements Muscles of Facial Expression: None, normal Lips and Perioral Area: None, normal Jaw: None, normal Tongue: None, normal,Extremity Movements Upper (arms, wrists, hands, fingers): None, normal Lower (legs, knees, ankles, toes): None, normal, Trunk Movements Neck, shoulders, hips: None, normal, Overall Severity Severity of abnormal movements (highest score from questions above): None, normal Incapacitation due to abnormal movements: None, normal Patient's awareness of abnormal movements (rate only patient's report): No Awareness, Dental Status Current problems with teeth and/or dentures?: No Does patient usually wear dentures?: No  CIWA:  CIWA-Ar Total: 1 COWS:  COWS Total Score: 1  Musculoskeletal: Strength & Muscle Tone: within normal limits Gait & Station: normal Patient leans: N/A  Psychiatric Specialty Exam: Physical Exam  Nursing note and vitals reviewed. Psychiatric: His  speech is normal and behavior is normal. Judgment and thought content normal. Cognition and memory are normal. He exhibits a depressed mood.    Review of Systems  Constitutional: Negative.   HENT: Negative.   Eyes: Negative.   Respiratory: Negative.   Cardiovascular: Negative.   Gastrointestinal: Negative.   Genitourinary: Negative.   Musculoskeletal: Negative.   Skin: Negative.   Neurological: Negative.   Endo/Heme/Allergies: Negative.   Psychiatric/Behavioral: Negative.   All other systems reviewed and are negative.  dizziness,  no chest pain or shortness of breath , no vomiting   Blood pressure 97/70, pulse 90, temperature 97.7 F (36.5 C), temperature  source Oral, resp. rate 18, height '6\' 3"'  (1.905 m), weight 196 lb (88.9 kg).Body mass index is 24.5 kg/m.  General Appearance: improving  grooming   Eye Contact:  Good  Speech:  Normal Rate  Volume:  Normal  Mood:  Still depressed , but has improved compared to admission   Affect:  Less constricted, more reactive, still anxious   Thought Process:  Linear  Orientation:  Other:  alert and attentive   Thought Content:  Denies hallucinations, no delusions expressed, not internally preoccupied, anxious ruminations   Suicidal Thoughts:  No- at this time denies any active suicidal ideations or self injurious ideations, and contracts for safety on unit   Homicidal Thoughts:  No no homicidal or violent ideations   Memory:  recent and remote grossly intact   Judgement:  improving   Insight:  Improving   Psychomotor Activity:   More visible on unit today  Concentration:  Concentration: Good and Attention Span: Good- of note, yesterday scored 30/30 on MMSE   Recall:  Good  Fund of Knowledge:  Good  Language:  Good  Akathisia:  Negative  Handed:  Right  AIMS (if indicated):     Assets:  Desire for Improvement Resilience  ADL's:  Intact  Cognition:  WNL  Sleep:  Number of Hours: 6.5   Assessment - patient reports that although  still depressed, he is starting to feel somewhat better and expresses a sense of optimism and some relief because he feels medications are helping. Denies side effects at this time, but does express some concern about potential increased anxiety on Wellbutrin. However, he is reluctant to change this medication or its current dose as he feels it is helping his mood significantly. Denies suicidal ideations at this time. Vitamin D deficiency identified by lab report- is started on Vitamin D supplementation  Treatment Plan Summary: Daily contact with patient to assess and evaluate symptoms and progress in treatment, Medication management, Plan inpatient admission  and medications as below  Encourage group , milieu participation to work on coping skills and symptom reduction  Continue   Wellbutrin XL 300 mg daily Continue Zoloft 50 mgrs QDAY for depression and axiety Continue   Klonopin   0.5 mgrs BID for anxiety Continue  Neurontin 400 mgrs TID for ongoing anxiety, pain.  Continue Vistaril 25 mgrs Q 6 hours PRN for anxiety as needed Continue Remeron  15  mgrs QHS for depression and insomnia  Increase Vitamin D supplementation to 1000 U daily .    Neita Garnet, MD  06/28/2016, 2:42 PM  Patient ID: Christopher Giles, male   DOB: 11/09/1965, 50 y.o.   MRN: [id number]

## 2016-06-28 NOTE — Progress Notes (Signed)
D: Patient complains of continued anxiety and depressive symptoms.  He rates his depression, hopelessness and anxiety as a 7.  He denies any thoughts of self harm.  He has been out of room with less isolation.  He is visible in the milieu.  His goal today was to "get out of scrubs and put on my clothes."  He is pleasant and is attending groups.  He hygiene has improved.   A: Continue to monitor medication management and MD orders.  Safety checks completed every 15 minutes per protocol. Offer support and encouragement as needed. R: Patient is receptive to staff; his behavior is appropriate.

## 2016-06-28 NOTE — Progress Notes (Signed)
Pt spent the entire evening in the dayroom playing cards with his peers.  He stayed for evening group, but then went back to playing cards after group was finished.  Pt denies SI/HI/AVH.  He reports his anxiety is still high, but says the doctor changed his medication and is hopeful it will continue to decrease his anxiety level.  Pt voiced no other needs or concerns this evening.  He waited until 2300 to get his hs medication.  Pt has been pleasant and cooperative with staff tonight.  Support and encouragement offered.  Discharge plans are in process.  Safety maintained with q15 minute checks.

## 2016-06-28 NOTE — Tx Team (Signed)
Interdisciplinary Treatment and Diagnostic Plan Update  06/28/2016 Time of Session: 10 AM Christopher Giles MRN: [id number]  Principal Diagnosis: Major depression, recurrent (HCC)  Secondary Diagnoses: Principal Problem:   Major depression, recurrent (HCC) Active Problems:   Severe episode of recurrent major depressive disorder, without psychotic features (HCC)   Current Medications:  Current Facility-Administered Medications  Medication Dose Route Frequency Provider Last Rate Last Dose  . acetaminophen (TYLENOL) tablet 650 mg  650 mg Oral Q6H PRN Jackelyn Poling, NP   650 mg at 06/25/16 1951  . alum & mag hydroxide-simeth (MAALOX/MYLANTA) 200-200-20 MG/5ML suspension 30 mL  30 mL Oral Q4H PRN Jackelyn Poling, NP   30 mL at 06/25/16 1813  . buPROPion (WELLBUTRIN XL) 24 hr tablet 300 mg  300 mg Oral Daily Molinda Bailiff, MD   300 mg at 06/28/16 0805  . cholecalciferol (VITAMIN D) tablet 400 Units  400 Units Oral Daily Molinda Bailiff, MD   400 Units at 06/28/16 0805  . clonazePAM (KLONOPIN) tablet 0.5 mg  0.5 mg Oral BID Craige Cotta, MD   0.5 mg at 06/28/16 0805  . gabapentin (NEURONTIN) capsule 400 mg  400 mg Oral TID Craige Cotta, MD   400 mg at 06/28/16 0805  . hydrOXYzine (ATARAX/VISTARIL) tablet 25 mg  25 mg Oral Q6H PRN Jackelyn Poling, NP   25 mg at 06/27/16 1350  . levothyroxine (SYNTHROID, LEVOTHROID) tablet 75 mcg  75 mcg Oral QAC breakfast Craige Cotta, MD   75 mcg at 06/28/16 0608  . magnesium citrate solution 1 Bottle  1 Bottle Oral Once PRN Rockey Situ Cobos, MD      . magnesium hydroxide (MILK OF MAGNESIA) suspension 30 mL  30 mL Oral Daily PRN Jackelyn Poling, NP   30 mL at 06/22/16 1546  . mirtazapine (REMERON) tablet 15 mg  15 mg Oral QHS Rockey Situ Cobos, MD   15 mg at 06/27/16 2300  . nicotine (NICODERM CQ - dosed in mg/24 hours) patch 21 mg  21 mg Transdermal Daily Craige Cotta, MD   21 mg at 06/28/16 0807  . sertraline (ZOLOFT) tablet 50 mg  50 mg Oral Daily  Molinda Bailiff, MD   50 mg at 06/28/16 0805  . tamsulosin (FLOMAX) capsule 0.4 mg  0.4 mg Oral QHS Rockey Situ Cobos, MD   0.4 mg at 06/27/16 2300   PTA Medications: Prescriptions Prior to Admission  Medication Sig Dispense Refill Last Dose  . bismuth subsalicylate (PEPTO BISMOL) 262 MG chewable tablet Chew 524 mg by mouth as needed for indigestion or diarrhea or loose stools.   unk  . clonazePAM (KLONOPIN) 1 MG tablet Take 1 mg by mouth 3 (three) times daily as needed for anxiety.   06/12/2016 at Unknown time  . DULoxetine (CYMBALTA) 60 MG capsule Take 60 mg by mouth daily.   06/12/2016 at Unknown time  . ibuprofen (ADVIL,MOTRIN) 200 MG tablet Take 400 mg by mouth every 6 (six) hours as needed for moderate pain.   unk  . levothyroxine (SYNTHROID, LEVOTHROID) 50 MCG tablet Take 1 tablet (50 mcg total) by mouth daily before breakfast. (Patient taking differently: Take 75 mcg by mouth daily before breakfast. ) 30 tablet 1 06/12/2016 at Unknown time  . Menthol, Topical Analgesic, (BIOFREEZE ROLL-ON COLORLESS) 4 % GEL Apply 1 application topically daily as needed (pain).   unk  . Menthol-Methyl Salicylate (ICY HOT BALM EXTRA STRENGTH) 7.6-29 % OINT Apply 1 application  topically daily as needed (pain).   unk  . naproxen sodium (ANAPROX) 220 MG tablet Take 220 mg by mouth 2 (two) times daily as needed (pain).   unk    Patient Stressors: Medication change or noncompliance Occupational concerns  Patient Strengths: Average or above average intelligence Motivation for treatment/growth Physical Health Supportive family/friends  Treatment Modalities: Medication Management, Group therapy, Case management,  1 to 1 session with clinician, Psychoeducation, Recreational therapy.   Physician Treatment Plan for Primary Diagnosis: Major depression, recurrent (HCC) Long Term Goal(s): Improvement in symptoms so as ready for discharge Improvement in symptoms so as ready for discharge   Short Term Goals:  Ability to identify changes in lifestyle to reduce recurrence of condition will improve Ability to verbalize feelings will improve Ability to identify and develop effective coping behaviors will improve Compliance with prescribed medications will improve Ability to identify changes in lifestyle to reduce recurrence of condition will improve Ability to demonstrate self-control will improve Ability to maintain clinical measurements within normal limits will improve Ability to identify triggers associated with substance abuse/mental health issues will improve  Medication Management: Evaluate patient's response, side effects, and tolerance of medication regimen.  Therapeutic Interventions: 1 to 1 sessions, Unit Group sessions and Medication administration.  Evaluation of Outcomes: Progressing  Physician Treatment Plan for Secondary Diagnosis: Principal Problem:   Major depression, recurrent (HCC) Active Problems:   Severe episode of recurrent major depressive disorder, without psychotic features (HCC)  Long Term Goal(s): Improvement in symptoms so as ready for discharge Improvement in symptoms so as ready for discharge   Short Term Goals: Ability to identify changes in lifestyle to reduce recurrence of condition will improve Ability to verbalize feelings will improve Ability to identify and develop effective coping behaviors will improve Compliance with prescribed medications will improve Ability to identify changes in lifestyle to reduce recurrence of condition will improve Ability to demonstrate self-control will improve Ability to maintain clinical measurements within normal limits will improve Ability to identify triggers associated with substance abuse/mental health issues will improve     Medication Management: Evaluate patient's response, side effects, and tolerance of medication regimen.  Therapeutic Interventions: 1 to 1 sessions, Unit Group sessions and Medication  administration.  Evaluation of Outcomes: Progressing   RN Treatment Plan for Primary Diagnosis: Major depression, recurrent (HCC) Long Term Goal(s): Knowledge of disease and therapeutic regimen to maintain health will improve  Short Term Goals: Ability to disclose and discuss suicidal ideas and Ability to identify and develop effective coping behaviors will improve  Medication Management: RN will administer medications as ordered by provider, will assess and evaluate patient's response and provide education to patient for prescribed medication. RN will report any adverse and/or side effects to prescribing provider.  Therapeutic Interventions: 1 on 1 counseling sessions, Psychoeducation, Medication administration, Evaluate responses to treatment, Monitor vital signs and CBGs as ordered, Perform/monitor CIWA, COWS, AIMS and Fall Risk screenings as ordered, Perform wound care treatments as ordered.  Evaluation of Outcomes: Progressing   LCSW Treatment Plan for Primary Diagnosis: Major depression, recurrent (HCC) Long Term Goal(s): Safe transition to appropriate next level of care at discharge, Engage patient in therapeutic group addressing interpersonal concerns.  Short Term Goals: Engage patient in aftercare planning with referrals and resources, Increase social support and Increase ability to appropriately verbalize feelings  Therapeutic Interventions: Assess for all discharge needs, 1 to 1 time with Social worker, Explore available resources and support systems, Assess for adequacy in community support network, Educate family and  significant other(s) on suicide prevention, Complete Psychosocial Assessment, Interpersonal group therapy.  Evaluation of Outcomes: Progressing   Progress in Treatment: Attending groups: Yes. Participating in groups: Progressing Taking medication as prescribed: Yes. Toleration medication: Yes. Family/Significant other contact made: Yes with sister Patient  understands diagnosis: Yes. Discussing patient identified problems/goals with staff: Yes. Medical problems stabilized or resolved: Yes. Denies suicidal/homicidal ideation: No, Pt endorses passive SI Issues/concerns per patient self-inventory: No. Other: na  New problem(s) identified: No, Describe:  none at this time  New Short Term/Long Term Goal(s): None identified at this time.   Discharge Plan or Barriers: limited support network, unstable housing  06/18/16: Pt continues to be unsure if he can return to his sister's house; will follow-up with outpatient rescources  06/28/16: Pt reports that it is likely that he can return to his apartment. Pt has been offered resources such as [employer], [employer], and shelter resources.   Reason for Continuation of Hospitalization: Depression Medication stabilization Suicidal ideation  Estimated Length of Stay: 2-4 days  Attendees: Patient: 06/28/2016 10:18 AM  Physician: Dr. Jama Flavors MD 06/28/2016 10:18 AM  Nursing: Romeo Rabon 06/28/2016 10:18 AM  RN Care Manager: Sondra Barges RN 06/28/2016 10:18 AM  Social Worker: Chad Cordial LCSW 06/28/2016 10:18 AM  Recreational Therapist:  06/28/2016 10:18 AM  Other: Baxter Kail, NP 06/28/2016 10:18 AM  Other:  06/28/2016 10:18 AM  Other: 06/28/2016 10:18 AM    Scribe for Treatment Team: Elaina Hoops, LCSW 06/28/2016 10:18 AM

## 2016-06-28 NOTE — [facility] Group Notes (Signed)
[facility] LCSW Group Therapy  06/28/2016 1:15pm  Type of Therapy:  Group Therapy vercoming Obstacles  Participation Level:  Active  Participation Quality:  Appropriate   Affect:  Appropriate  Cognitive:  Appropriate and Oriented  Insight:  Developing/Improving and Improving  Engagement in Therapy:  Improving  Modes of Intervention:  Discussion, Exploration, Problem-solving and Support  Description of Group:   In this group patients will be encouraged to explore what they see as obstacles to their own wellness and recovery. They will be guided to discuss their thoughts, feelings, and behaviors related to these obstacles. The group will process together ways to cope with barriers, with attention given to specific choices patients can make. Each patient will be challenged to identify changes they are motivated to make in order to overcome their obstacles. This group will be process-oriented, with patients participating in exploration of their own experiences as well as giving and receiving support and challenge from other group members.  Summary of Patient Progress: Pt expresses that he has been trying to use recovery strategies while in the hospital. Pt expressed that lack of employment is an obstacle for him. He is hopeful to find a job and be committed to recovery in order to overcome his obstacles.    Therapeutic Modalities:   Cognitive Behavioral Therapy Solution Focused Therapy Motivational Interviewing Relapse Prevention Therapy   Chad CordialLauren Carter, LCSWA 06/28/2016 4:08 PM

## 2016-06-28 NOTE — Progress Notes (Signed)
Adult Psychoeducational Group Note  Date:  06/28/2016 Time:  9:46 PM  Group Topic/Focus:  Wrap-Up Group:   The focus of this group is to help patients review their daily goal of treatment and discuss progress on daily workbooks.   Participation Level:  Did Not Attend  Participation Quality:  Did not attend  Affect:  Did not attend  Cognitive:  Did not attend  Insight: None  Engagement in Group:  Did not attend  Modes of Intervention:  Did not attend  Additional Comments:  Patient did not attend wrap up group tonight.  Jocilyn Trego L Devonna Oboyle 06/28/2016, 9:46 PM

## 2016-06-29 MED ORDER — GABAPENTIN 300 MG PO CAPS
600.0000 mg | ORAL_CAPSULE | Freq: Three times a day (TID) | ORAL | Status: DC
  Filled 2016-06-29 (×2): qty 2

## 2016-06-29 MED ORDER — GABAPENTIN 300 MG PO CAPS
600.0000 mg | ORAL_CAPSULE | Freq: Three times a day (TID) | ORAL | Status: DC
  Administered 2016-06-29 – 2016-07-06 (×21): 600 mg via ORAL
  Filled 2016-06-29: qty 42
  Filled 2016-06-29 (×14): qty 2
  Filled 2016-06-29: qty 42
  Filled 2016-06-29: qty 2
  Filled 2016-06-29: qty 42
  Filled 2016-06-29: qty 2
  Filled 2016-06-29: qty 42
  Filled 2016-06-29 (×2): qty 2
  Filled 2016-06-29: qty 42
  Filled 2016-06-29 (×6): qty 2

## 2016-06-29 NOTE — Progress Notes (Signed)
Social worker received call from patient's sister that their mother has passed away.  [facility] to speak with MD and Child psychotherapistsocial worker.

## 2016-06-29 NOTE — [facility] Group Notes (Signed)
Unit rules were explained and writer asked the patients how their day was? Pt stated his day was a 1 because he found out his mother had passed away.

## 2016-06-29 NOTE — Progress Notes (Signed)
Christopher Giles. Christopher Giles had been up and visible in milieu this evening, did not attend evening group activity. Christopher FearingJames has appeared brighter this evening and more engaging than in past encounters. Christopher FearingJames reports that he is finally starting to feel better and reports that he feels the medications, he believes, are starting to kick in. Christopher FearingJames does endorse some anxiety but reports not as severe as has been. Christopher FearingJames did receive all bedtime medications without incident and did not verbalize any complaints of pain. A. Support and encouragement provided. R. Safety maintained, will continue to monitor.

## 2016-06-29 NOTE — Progress Notes (Signed)
D: Patient continues to complain of anxiety.  He has been visible in the milieu and is interacting well with peers and staff.  Patient's hygiene has improved, as he has taken a shower.  He reports improvement since admission and doesn't isolate as much.  His affect is brighter.  He rates his depression as a 6; hopelessness as a 5; anxiety as an 8.  His goal today is to "stay in the now and give positive messages to self."  He denies any thoughts of self harm; HI/AVH. A: Continue to monitor medication management and MD orders.  Safety checks completed every 15 minutes per protocol.  Offer support and encouragement as needed. R: Patient is receptive to staff; his behavior is appropriate.

## 2016-06-29 NOTE — Progress Notes (Signed)
Recreation Therapy Notes  Animal-Assisted Activity (AAA) Program Checklist/Progress Notes Patient Eligibility Criteria Checklist & Daily Group note for Rec TxIntervention  Date: 10.17.2017 Time: 2:45pm Location: [address] Dayroom    AAA/T Program Assumption of Risk Form signed by Patient/ or Parent Legal Guardian Yes  Patient is free of allergies or sever asthma Yes  Patient reports no fear of animals Yes  Patient reports no history of cruelty to animals Yes  Patient understands his/her participation is voluntary Yes  Patient washes hands before animal contact Yes  Patient washes hands after animal contact Yes  Behavioral Response: Engaged, Attentive   Education:Hand Washing, Appropriate Animal Interaction   Education Outcome: Acknowledges education.   Clinical Observations/Feedback: Patient attended session and interacted appropriately with therapy dog and peers. Patient asked appropriate questions about therapy dog and his training. Patient shared stories about their pets at home with group.   Ashaz Robling L Carmine Carrozza, LRT/CTRS        Lord Lancour L 06/29/2016 3:05 PM 

## 2016-06-29 NOTE — Progress Notes (Signed)
[facility] MD Progress Note  06/29/2016 4:58 PM Christopher Giles  MRN:  [id number]   Subjective:  Patient reports ongoing depression, anxiety. Today his sister contacted him and told him that his mother has passed away. Patient reports feeling saddened by this news, but states " I know my mother would want me to concentrate on getting better, and not give up, so that is what I am going to continue doing ".  Objective: I have discussed case with treatment team and have met with patient .   Patient remains depressed, also somewhat anxious, although as noted, has improved compared to admission. He denies medication side effects, and feels medications are helping . In particular he feels Neurontin is helping his anxiety, and is hoping to titrate dose further, and to adjust dose scheduling to reflect meal times ( ie: around 8 AM, 12, 5 PM) as he feels this schedule would maximize its effectiveness . He is visible on unit and interacting with peers. No disruptive or agitated behaviors . Saddened by mother's passing, but as above, states that he is trying to maintain focus on positives and feels that her loss is helping him focus more on getting better and fighting his depression, as this is what she would have wanted for him. Responds well to support, encouragement, empathy. Denies any suicidal ideations and contracts for safety. He remains future oriented and expressed interest in working with Vocational Rehabilitation after discharge, if an option.   Principal Problem: Major depression, recurrent ([place]) Diagnosis:   Patient Active Problem List   Diagnosis Date Noted  . Severe episode of recurrent major depressive disorder, without psychotic features ([place]) [F33.2]   . Major depression, recurrent ([place]) [F33.9] 06/13/2016   Total Time spent with patient: 25 minutes   Past Medical History:  Past Medical History:  Diagnosis Date  . Anxiety   . Depression   . Substance abuse   . Suicide attempt    x 6 per  pt  . Thyroid disease    hypo    Past Surgical History:  Procedure Laterality Date  . punctured lung     Family History:  Family History  Problem Relation Age of Onset  . Hyperlipidemia Mother   . Cancer Mother   . Cancer Father   . Diabetes Maternal Grandmother     Social History:  History  Alcohol Use No     History  Drug Use No    Social History   Social History  . Marital status: Single    Spouse name: N/A  . Number of children: N/A  . Years of education: N/A   Social History Main Topics  . Smoking status: Current Every Day Smoker    Packs/day: 1.00    Types: Cigarettes  . Smokeless tobacco: Never Used  . Alcohol use No  . Drug use: No  . Sexual activity: Not Asked   Other Topics Concern  . None   Social History Narrative  . None   Additional Social History:   Sleep: gradually improving   Appetite:  Improving   Current Medications: Current Facility-Administered Medications  Medication Dose Route Frequency Provider Last Rate Last Dose  . acetaminophen (TYLENOL) tablet 650 mg  650 mg Oral Q6H PRN Rozetta Nunnery, NP   650 mg at 06/25/16 1951  . alum & mag hydroxide-simeth (MAALOX/MYLANTA) 200-200-20 MG/5ML suspension 30 mL  30 mL Oral Q4H PRN Rozetta Nunnery, NP   30 mL at 06/25/16 1813  . buPROPion [facility]  XL) 24 hr tablet 300 mg  300 mg Oral Daily Sueanne Margarita, MD   300 mg at 06/29/16 0830  . cholecalciferol (VITAMIN D) tablet 1,000 Units  1,000 Units Oral Daily Jenne Campus, MD   1,000 Units at 06/29/16 0830  . clonazePAM (KLONOPIN) tablet 0.5 mg  0.5 mg Oral BID Jenne Campus, MD   0.5 mg at 06/29/16 1657  . gabapentin (NEURONTIN) capsule 600 mg  600 mg Oral TID Jenne Campus, MD      . hydrOXYzine (ATARAX/VISTARIL) tablet 25 mg  25 mg Oral Q6H PRN Rozetta Nunnery, NP   25 mg at 06/27/16 1350  . levothyroxine (SYNTHROID, LEVOTHROID) tablet 75 mcg  75 mcg Oral QAC breakfast Jenne Campus, MD   75 mcg at 06/29/16 0612  . magnesium  citrate solution 1 Bottle  1 Bottle Oral Once PRN Myer Peer Cobos, MD      . magnesium hydroxide (MILK OF MAGNESIA) suspension 30 mL  30 mL Oral Daily PRN Rozetta Nunnery, NP   30 mL at 06/22/16 1546  . mirtazapine (REMERON) tablet 15 mg  15 mg Oral QHS Jenne Campus, MD   15 mg at 06/28/16 2131  . nicotine (NICODERM CQ - dosed in mg/24 hours) patch 21 mg  21 mg Transdermal Daily Jenne Campus, MD   21 mg at 06/29/16 0831  . sertraline (ZOLOFT) tablet 50 mg  50 mg Oral Daily Sueanne Margarita, MD   50 mg at 06/29/16 0830  . tamsulosin (FLOMAX) capsule 0.4 mg  0.4 mg Oral QHS Jenne Campus, MD   0.4 mg at 06/28/16 2131    Lab Results:  No results found for this or any previous visit (from the past 48 hour(s)).  Blood Alcohol level:  Lab Results  Component Value Date   ETH <5 56/43/3295    Metabolic Disorder Labs: No results found for: HGBA1C, MPG No results found for: PROLACTIN No results found for: CHOL, TRIG, HDL, CHOLHDL, VLDL, LDLCALC  Physical Findings: AIMS: Facial and Oral Movements Muscles of Facial Expression: None, normal Lips and Perioral Area: None, normal Jaw: None, normal Tongue: None, normal,Extremity Movements Upper (arms, wrists, hands, fingers): None, normal Lower (legs, knees, ankles, toes): None, normal, Trunk Movements Neck, shoulders, hips: None, normal, Overall Severity Severity of abnormal movements (highest score from questions above): None, normal Incapacitation due to abnormal movements: None, normal Patient's awareness of abnormal movements (rate only patient's report): No Awareness, Dental Status Current problems with teeth and/or dentures?: No Does patient usually wear dentures?: No  CIWA:  CIWA-Ar Total: 1 COWS:  COWS Total Score: 1  Musculoskeletal: Strength & Muscle Tone: within normal limits Gait & Station: normal Patient leans: N/A  Psychiatric Specialty Exam: Physical Exam  Nursing note and vitals reviewed. Psychiatric: His  speech is normal and behavior is normal. Judgment and thought content normal. Cognition and memory are normal. He exhibits a depressed mood.    Review of Systems  Constitutional: Negative.   HENT: Negative.   Eyes: Negative.   Respiratory: Negative.   Cardiovascular: Negative.   Gastrointestinal: Negative.   Genitourinary: Negative.   Musculoskeletal: Negative.   Skin: Negative.   Neurological: Negative.   Endo/Heme/Allergies: Negative.   Psychiatric/Behavioral: Negative.   All other systems reviewed and are negative.  dizziness,  no chest pain or shortness of breath , no vomiting   Blood pressure 106/65, pulse 90, temperature 97.4 F (36.3 C), temperature source Oral, resp. rate 16,  height '6\' 3"'  (1.905 m), weight 196 lb (88.9 kg).Body mass index is 24.5 kg/m.  General Appearance: improving  grooming   Eye Contact:  Good  Speech:  Normal Rate  Volume:  Normal  Mood:  Remains depressed, but states has improved partially  Affect:  Remains constricted   Thought Process:  Linear  Orientation:  Other:  alert and attentive   Thought Content:  Denies hallucinations, no delusions expressed, not internally preoccupied, anxious ruminations   Suicidal Thoughts:  No- at this time denies any active suicidal ideations or self injurious ideations, and contracts for safety on unit   Homicidal Thoughts:  No no homicidal or violent ideations   Memory:  recent and remote grossly intact   Judgement:  improving   Insight:  Improving   Psychomotor Activity:   More visible on unit today  Concentration:  Concentration: Good and Attention Span: Good  Recall:  Good  Fund of Knowledge:  Good  Language:  Good  Akathisia:  Negative  Handed:  Right  AIMS (if indicated):     Assets:  Desire for Improvement Resilience  ADL's:  Intact  Cognition:  WNL  Sleep:  Number of Hours: 6.75   Assessment - Today found out from sister that mother has passed away. Reports being saddened by her loss, but that he  is more motivated to fight his depression and focus on improving, as he knows this is what she wanted for him. He  remains depressed, anxious, but has improved partially . He is tolerating medications well at this time, and in  Particular feels that Neurontin has helped decrease anxiety. Denies suicidal ideations at this time, and contracts for safety .  Treatment Plan Summary: Daily contact with patient to assess and evaluate symptoms and progress in treatment, Medication management, Plan inpatient admission  and medications as below  Encourage group , milieu participation to work on coping skills and symptom reduction  Continue   Wellbutrin XL 300 mg daily for depression Continue Zoloft 50 mgrs QDAY for depression and axiety Continue   Klonopin   0.5 mgrs BID for anxiety Increase  Neurontin to 600  mgrs TID for ongoing anxiety, pain.  Continue Vistaril 25 mgrs Q 6 hours PRN for anxiety as needed Continue Remeron  15  mgrs QHS for depression and insomnia  Continue  Vitamin D supplementation to 1000 U daily .  Treatment team working on disposition planning options    Neita Garnet, MD  06/29/2016, 4:58 PM  Patient ID: Christopher Giles, male   DOB: 1966/04/12, 50 y.o.   MRN: [id number]

## 2016-06-29 NOTE — [facility] Group Notes (Signed)
[facility] LCSW Group Therapy 06/29/2016 1:15 PM  Type of Therapy: Group Therapy- Feelings about Diagnosis  Participation Level: Active   Participation Quality:  Appropriate  Affect:  Appropriate  Cognitive: Alert and Oriented   Insight:  Developing   Engagement in Therapy: Developing/Improving and Engaged   Modes of Intervention: Clarification, Confrontation, Discussion, Education, Exploration, Limit-setting, Orientation, Problem-solving, Rapport Building, Dance movement psychotherapisteality Testing, Socialization and Support  Description of Group:   This group will allow patients to explore their thoughts and feelings about diagnoses they have received. Patients will be guided to explore their level of understanding and acceptance of these diagnoses. Facilitator will encourage patients to process their thoughts and feelings about the reactions of others to their diagnosis, and will guide patients in identifying ways to discuss their diagnosis with significant others in their lives. This group will be process-oriented, with patients participating in exploration of their own experiences as well as giving and receiving support and challenge from other group members.  Summary of Progress/Problems:  Pt participated appropriately and interacted well with peers. He continues to describe feeling overwhelmed today and expressed some frustration that he "didn't have enough time" the hospital to let the medications begin to work.   Therapeutic Modalities:   Cognitive Behavioral Therapy Solution Focused Therapy Motivational Interviewing Relapse Prevention Therapy  Chad CordialLauren Carter, LCSWA 06/29/2016 2:51 PM

## 2016-06-30 DIAGNOSIS — F331 Major depressive disorder, recurrent, moderate: Secondary | ICD-10-CM

## 2016-06-30 NOTE — Progress Notes (Signed)
[facility] MD Progress Note  06/30/2016 4:19 PM Christopher Giles  MRN:  [id number]   Subjective:  Patient states that he is still having hardship on how he will be able to go back to his apt and pay utilities.  Continues to ruminate about life and misfortune.  It brings him anxiety.  No adverse SE from meds.  Has been trying to attend group work.  It is clear that patient is frustrated.  Denies SI HI, AVH  Principal Problem: Major depression, recurrent (HCC) Diagnosis:   Patient Active Problem List   Diagnosis Date Noted  . Severe episode of recurrent major depressive disorder, without psychotic features (HCC) [F33.2]   . Major depression, recurrent (HCC) [F33.9] 06/13/2016   Total Time spent with patient: 25 minutes   Past Medical History:  Past Medical History:  Diagnosis Date  . Anxiety   . Depression   . Substance abuse   . Suicide attempt    x 6 per pt  . Thyroid disease    hypo    Past Surgical History:  Procedure Laterality Date  . punctured lung     Family History:  Family History  Problem Relation Age of Onset  . Hyperlipidemia Mother   . Cancer Mother   . Cancer Father   . Diabetes Maternal Grandmother     Social History:  History  Alcohol Use No     History  Drug Use No    Social History   Social History  . Marital status: Single    Spouse name: N/A  . Number of children: N/A  . Years of education: N/A   Social History Main Topics  . Smoking status: Current Every Day Smoker    Packs/day: 1.00    Types: Cigarettes  . Smokeless tobacco: Never Used  . Alcohol use No  . Drug use: No  . Sexual activity: Not Asked   Other Topics Concern  . None   Social History Narrative  . None   Additional Social History:   Sleep: gradually improving   Appetite:  Improving   Current Medications: Current Facility-Administered Medications  Medication Dose Route Frequency Provider Last Rate Last Dose  . acetaminophen (TYLENOL) tablet 650 mg  650 mg Oral Q6H  PRN Jackelyn Poling, NP   650 mg at 06/25/16 1951  . alum & mag hydroxide-simeth (MAALOX/MYLANTA) 200-200-20 MG/5ML suspension 30 mL  30 mL Oral Q4H PRN Jackelyn Poling, NP   30 mL at 06/25/16 1813  . buPROPion (WELLBUTRIN XL) 24 hr tablet 300 mg  300 mg Oral Daily Molinda Bailiff, MD   300 mg at 06/30/16 0810  . cholecalciferol (VITAMIN D) tablet 1,000 Units  1,000 Units Oral Daily Craige Cotta, MD   1,000 Units at 06/30/16 0810  . clonazePAM (KLONOPIN) tablet 0.5 mg  0.5 mg Oral BID Craige Cotta, MD   0.5 mg at 06/30/16 0810  . gabapentin (NEURONTIN) capsule 600 mg  600 mg Oral TID Craige Cotta, MD   600 mg at 06/30/16 1205  . hydrOXYzine (ATARAX/VISTARIL) tablet 25 mg  25 mg Oral Q6H PRN Jackelyn Poling, NP   25 mg at 06/30/16 1310  . levothyroxine (SYNTHROID, LEVOTHROID) tablet 75 mcg  75 mcg Oral QAC breakfast Craige Cotta, MD   75 mcg at 06/30/16 0607  . magnesium citrate solution 1 Bottle  1 Bottle Oral Once PRN Rockey Situ Cobos, MD      . magnesium hydroxide (MILK OF MAGNESIA)  suspension 30 mL  30 mL Oral Daily PRN Jackelyn Poling, NP   30 mL at 06/29/16 1829  . mirtazapine (REMERON) tablet 15 mg  15 mg Oral QHS Rockey Situ Cobos, MD   15 mg at 06/29/16 2136  . nicotine (NICODERM CQ - dosed in mg/24 hours) patch 21 mg  21 mg Transdermal Daily Craige Cotta, MD   21 mg at 06/30/16 6045  . sertraline (ZOLOFT) tablet 50 mg  50 mg Oral Daily Molinda Bailiff, MD   50 mg at 06/30/16 0809  . tamsulosin (FLOMAX) capsule 0.4 mg  0.4 mg Oral QHS Craige Cotta, MD   0.4 mg at 06/29/16 2136    Lab Results:  No results found for this or any previous visit (from the past 48 hour(s)).  Blood Alcohol level:  Lab Results  Component Value Date   ETH <5 06/12/2016    Metabolic Disorder Labs: No results found for: HGBA1C, MPG No results found for: PROLACTIN No results found for: CHOL, TRIG, HDL, CHOLHDL, VLDL, LDLCALC  Physical Findings: AIMS: Facial and Oral Movements Muscles of  Facial Expression: None, normal Lips and Perioral Area: None, normal Jaw: None, normal Tongue: None, normal,Extremity Movements Upper (arms, wrists, hands, fingers): None, normal Lower (legs, knees, ankles, toes): None, normal, Trunk Movements Neck, shoulders, hips: None, normal, Overall Severity Severity of abnormal movements (highest score from questions above): None, normal Incapacitation due to abnormal movements: None, normal Patient's awareness of abnormal movements (rate only patient's report): No Awareness, Dental Status Current problems with teeth and/or dentures?: No Does patient usually wear dentures?: No  CIWA:  CIWA-Ar Total: 1 COWS:  COWS Total Score: 1  Musculoskeletal: Strength & Muscle Tone: within normal limits Gait & Station: normal Patient leans: N/A  Psychiatric Specialty Exam: Physical Exam  Nursing note and vitals reviewed. Psychiatric: He has a normal mood and affect. His speech is normal and behavior is normal. Judgment and thought content normal. Cognition and memory are normal.    Review of Systems  Constitutional: Negative.   HENT: Negative.   Eyes: Negative.   Respiratory: Negative.   Cardiovascular: Negative.   Gastrointestinal: Negative.   Genitourinary: Negative.   Musculoskeletal: Negative.   Skin: Negative.   Neurological: Negative.   Endo/Heme/Allergies: Negative.   Psychiatric/Behavioral: Negative.   All other systems reviewed and are negative.  dizziness,  no chest pain or shortness of breath , no vomiting   Blood pressure 105/79, pulse 75, temperature 97.6 F (36.4 C), temperature source Oral, resp. rate 16, height 6\' 3"  (1.905 m), weight 88.9 kg (196 lb).Body mass index is 24.5 kg/m.  General Appearance: improving  grooming   Eye Contact:  Good  Speech:  Normal Rate  Volume:  Normal  Mood:  Remains depressed, but states has improved partially  Affect:  Remains constricted   Thought Process:  Linear  Orientation:  Other:  alert  and attentive   Thought Content:  Denies hallucinations, no delusions expressed, not internally preoccupied, anxious ruminations   Suicidal Thoughts:  No- at this time denies any active suicidal ideations or self injurious ideations, and contracts for safety on unit   Homicidal Thoughts:  No no homicidal or violent ideations   Memory:  recent and remote grossly intact   Judgement:  improving   Insight:  Improving   Psychomotor Activity:   More visible on unit today  Concentration:  Concentration: Good and Attention Span: Good  Recall:  Good  Fund of Knowledge:  Good  Language:  Good  Akathisia:  Negative  Handed:  Right  AIMS (if indicated):     Assets:  Desire for Improvement Resilience  ADL's:  Intact  Cognition:  WNL  Sleep:  Number of Hours: 6.25   Assessment - Today found out from sister that mother has passed away. Reports being saddened by her loss, but that he is more motivated to fight his depression and focus on improving, as he knows this is what she wanted for him. He  remains depressed, anxious, but has improved partially . He is tolerating medications well at this time, and in  Particular feels that Neurontin has helped decrease anxiety. Denies suicidal ideations at this time, and contracts for safety .  Treatment Plan Summary: Daily contact with patient to assess and evaluate symptoms and progress in treatment, Medication management, Plan inpatient admission  and medications as below  Encourage group , milieu participation to work on coping skills and symptom reduction  Continue   Wellbutrin XL 300 mg daily for depression Continue Zoloft 50 mgrs QDAY for depression and axiety Continue   Klonopin   0.5 mgrs BID for anxiety Increase  Neurontin to 600  mgrs TID for ongoing anxiety, pain.  Continue Vistaril 25 mgrs Q 6 hours PRN for anxiety as needed Continue Remeron  15  mgrs QHS for depression and insomnia  Continue  Vitamin D supplementation to 1000 U daily .  Treatment  team working on disposition planning options   Lindwood QuaSheila May Natasja Niday, NP [facility] 06/30/2016, 4:19 PM

## 2016-06-30 NOTE — Progress Notes (Signed)
D: Patient appears sad and depressed; he reports severe anxiety.  He rates his depression, hopelessness and anxiety as an 8.  Patient learned that his mother passed yesterday.  He states, "She had been real sick."  He denies any thoughts of self harm; AVH/HI.  His goal today is to "get funds for heat at apartment.  Patient may be able to return to his apartment upon discharge. A: Continue to monitor medication management and MD orders.  Safety checks completed every 15 minutes per protocol.  Offer support and encouragement as needed. R: Patient is receptive to staff; he appears to be more isolative today.

## 2016-06-30 NOTE — Progress Notes (Addendum)
Recreation Therapy Notes  Date: 06/30/16 Time: 0930 Location: [address] Dayroom  Group Topic: Stress Management  Goal Area(s) Addresses:  Patient will verbalize importance of using healthy stress management. Patient will identify positive emotions associated with healthy stress management.   Intervention: Stress Management  Activity: Progressive Muscle Relaxation.  LRT introduced the stress management technique of progressive muscle relaxation.  LRT read a script to allow patients to participate in activity.  Patients were to follow along as LRT read script.  Education:  Stress Management, Discharge Planning  Education Outcome: Acknowledges education/In group clarification offered/Needs additional education  Clinical Observations/Feedback: Pt did not attend group.   Kitiara Hintze, LRT/CTRS         Trek Kimball A 06/30/2016 12:11 PM 

## 2016-06-30 NOTE — [facility] Group Notes (Signed)
[facility] LCSW Group Therapy  06/30/2016 4:15 PM  Type of Therapy:  Group Therapy  Participation Level:  Minimal   Participation Quality:  Attentive  Affect:  Appropriate  Cognitive:  Alert  Insight:  Limited   Engagement in Therapy:  Limited   Modes of Intervention:  Discussion, Education, Socialization and Support  Summary of Progress/Problems: Emotional Regulation: Patients will identify both negative and positive emotions. They will discuss emotions they have difficulty regulating and how they impact their lives. Patients will be asked to identify healthy coping skills to combat unhealthy reactions to negative emotions.  Pt attended group and stayed the entire time. Pt sat quietly and listened to other group members share.   Dyna Figuereo L Zury Fazzino MSW, LCSWA  06/30/2016, 4:15 PM    

## 2016-06-30 NOTE — Progress Notes (Signed)
Christopher Giles. Christopher Giles had been up and visible in milieu this evening, did attend and participate in evening group activity. Christopher Giles spoke of learning of his mother passing today and spoke of her being sick and spoke about how he is handling the news ok. Christopher Giles did appear more solemn this evening, was not seen interacting as much with peers as he had previous days and retired to his room early this evening, but did receive all bedtime medications without incident. A. Support and encouragement provided. R. Safety maintained, will continue to monitor.

## 2016-07-01 DIAGNOSIS — F17210 Nicotine dependence, cigarettes, uncomplicated: Secondary | ICD-10-CM

## 2016-07-01 DIAGNOSIS — Z808 Family history of malignant neoplasm of other organs or systems: Secondary | ICD-10-CM

## 2016-07-01 DIAGNOSIS — Z8349 Family history of other endocrine, nutritional and metabolic diseases: Secondary | ICD-10-CM

## 2016-07-01 MED ORDER — MIRTAZAPINE 30 MG PO TABS
30.0000 mg | ORAL_TABLET | Freq: Every day | ORAL | Status: DC
  Administered 2016-07-01 – 2016-07-05 (×5): 30 mg via ORAL
  Filled 2016-07-01 (×2): qty 1
  Filled 2016-07-01: qty 7
  Filled 2016-07-01 (×4): qty 1
  Filled 2016-07-01: qty 7

## 2016-07-01 MED ORDER — BUPROPION HCL ER (XL) 150 MG PO TB24
150.0000 mg | ORAL_TABLET | Freq: Every day | ORAL | Status: DC
  Administered 2016-07-01 – 2016-07-06 (×6): 150 mg via ORAL
  Filled 2016-07-01: qty 1
  Filled 2016-07-01 (×2): qty 7
  Filled 2016-07-01 (×8): qty 1

## 2016-07-01 NOTE — Progress Notes (Signed)
Pt attended karaoke group this evening.  

## 2016-07-01 NOTE — Progress Notes (Signed)
[facility] MD Progress Note  07/01/2016 10:18 AM Christopher Giles  MRN:  [id number]  Subjective:  Patient states that he is still having hardship on how he will be able to go back to his apt and pay utilities.  Continues to ruminate about life and misfortune.  It brings him anxiety.  No adverse SE from meds.  Has been trying to attend group work.  It is clear that patient is frustrated.  Denies SI HI, AVH   Objective: Pt seen and chart reviewed. Pt is alert/oriented x4, calm, cooperative, and appropriate to situation. Pt denies suicidal/homicidal ideation and psychosis and does not appear to be responding to internal stimuli. Pt states that he has felt like his anxiety is completely out of control and that he feels overstimulated by the Wellbutrin, stating that each time he takes it, he feels as though he is shaking and about to panic.   The pt has been here for several days beyond the normal length of stay. However, given this pt's poor response to various medications, it has been a challenge to find medications that this pt responds to. We changed 3 medications today. Therefore, we believe the pt will benefit from approximately 3 more days of medication management and therapy. However, he will discharge earlier if he makes remarkable improvement before then.    Principal Problem: Severe episode of recurrent major depressive disorder, without psychotic features (HCC) Diagnosis:   Patient Active Problem List   Diagnosis Date Noted  . Severe episode of recurrent major depressive disorder, without psychotic features (HCC) [F33.2]     Priority: High  . Moderate episode of recurrent major depressive disorder (HCC) [F33.1]    Total Time spent with patient: 25 minutes   Past Medical History:  Past Medical History:  Diagnosis Date  . Anxiety   . Depression   . Substance abuse   . Suicide attempt    x 6 per pt  . Thyroid disease    hypo    Past Surgical History:  Procedure Laterality Date  .  punctured lung     Family History:  Family History  Problem Relation Age of Onset  . Hyperlipidemia Mother   . Cancer Mother   . Cancer Father   . Diabetes Maternal Grandmother     Social History:  History  Alcohol Use No     History  Drug Use No    Social History   Social History  . Marital status: Single    Spouse name: N/A  . Number of children: N/A  . Years of education: N/A   Social History Main Topics  . Smoking status: Current Every Day Smoker    Packs/day: 1.00    Types: Cigarettes  . Smokeless tobacco: Never Used  . Alcohol use No  . Drug use: No  . Sexual activity: Not Asked   Other Topics Concern  . None   Social History Narrative  . None   Additional Social History:   Sleep: gradually improving   Appetite:  Improving   Current Medications: Current Facility-Administered Medications  Medication Dose Route Frequency Provider Last Rate Last Dose  . acetaminophen (TYLENOL) tablet 650 mg  650 mg Oral Q6H PRN Jackelyn Poling, NP   650 mg at 06/25/16 1951  . alum & mag hydroxide-simeth (MAALOX/MYLANTA) 200-200-20 MG/5ML suspension 30 mL  30 mL Oral Q4H PRN Jackelyn Poling, NP   30 mL at 06/25/16 1813  . buPROPion (WELLBUTRIN XL) 24 hr tablet 300 mg  300 mg Oral Daily Molinda BailiffUreh N Lekwauwa, MD   Stopped at 07/01/16 [phone]  . cholecalciferol (VITAMIN D) tablet 1,000 Units  1,000 Units Oral Daily Craige CottaFernando A Cobos, MD   1,000 Units at 07/01/16 [phone]  . clonazePAM (KLONOPIN) tablet 0.5 mg  0.5 mg Oral BID Craige CottaFernando A Cobos, MD   0.5 mg at 07/01/16 0823  . gabapentin (NEURONTIN) capsule 600 mg  600 mg Oral TID Craige CottaFernando A Cobos, MD   600 mg at 07/01/16 0823  . hydrOXYzine (ATARAX/VISTARIL) tablet 25 mg  25 mg Oral Q6H PRN Jackelyn PolingJason A Berry, NP   25 mg at 06/30/16 1310  . levothyroxine (SYNTHROID, LEVOTHROID) tablet 75 mcg  75 mcg Oral QAC breakfast Craige CottaFernando A Cobos, MD   75 mcg at 07/01/16 [id number]  . magnesium citrate solution 1 Bottle  1 Bottle Oral Once PRN Rockey SituFernando A Cobos, MD       . magnesium hydroxide (MILK OF MAGNESIA) suspension 30 mL  30 mL Oral Daily PRN Jackelyn PolingJason A Berry, NP   30 mL at 06/29/16 1829  . mirtazapine (REMERON) tablet 15 mg  15 mg Oral QHS Rockey SituFernando A Cobos, MD   15 mg at 06/30/16 2216  . nicotine (NICODERM CQ - dosed in mg/24 hours) patch 21 mg  21 mg Transdermal Daily Craige CottaFernando A Cobos, MD   21 mg at 07/01/16 0824  . sertraline (ZOLOFT) tablet 50 mg  50 mg Oral Daily Molinda BailiffUreh N [place], MD   50 mg at 07/01/16 0823  . tamsulosin (FLOMAX) capsule 0.4 mg  0.4 mg Oral QHS Craige CottaFernando A Cobos, MD   0.4 mg at 06/30/16 2216    Lab Results:  No results found for this or any previous visit (from the past 48 hour(s)).  Blood Alcohol level:  Lab Results  Component Value Date   ETH <5 06/12/2016    Metabolic Disorder Labs: No results found for: HGBA1C, MPG No results found for: PROLACTIN No results found for: CHOL, TRIG, HDL, CHOLHDL, VLDL, LDLCALC  Physical Findings: AIMS: Facial and Oral Movements Muscles of Facial Expression: None, normal Lips and Perioral Area: None, normal Jaw: None, normal Tongue: None, normal,Extremity Movements Upper (arms, wrists, hands, fingers): None, normal Lower (legs, knees, ankles, toes): None, normal, Trunk Movements Neck, shoulders, hips: None, normal, Overall Severity Severity of abnormal movements (highest score from questions above): None, normal Incapacitation due to abnormal movements: None, normal Patient's awareness of abnormal movements (rate only patient's report): No Awareness, Dental Status Current problems with teeth and/or dentures?: No Does patient usually wear dentures?: No  CIWA:  CIWA-Ar Total: 1 COWS:  COWS Total Score: 1  Musculoskeletal: Strength & Muscle Tone: within normal limits Gait & Station: normal Patient leans: N/A  Psychiatric Specialty Exam: Physical Exam  Nursing note and vitals reviewed. Psychiatric: He has a normal mood and affect. His speech is normal and behavior is normal.  Judgment and thought content normal. Cognition and memory are normal.    Review of Systems  Constitutional: Negative.   HENT: Negative.   Eyes: Negative.   Respiratory: Negative.   Cardiovascular: Negative.   Gastrointestinal: Negative.   Genitourinary: Negative.   Musculoskeletal: Negative.   Skin: Negative.   Neurological: Negative.   Endo/Heme/Allergies: Negative.   Psychiatric/Behavioral: Positive for depression. Negative for hallucinations, substance abuse and suicidal ideas. The patient is nervous/anxious and has insomnia.   All other systems reviewed and are negative.  dizziness,  no chest pain or shortness of breath , no vomiting   Blood pressure  105/74, pulse 71, temperature 97.5 F (36.4 C), temperature source Oral, resp. rate 16, height 6\' 3"  (1.905 m), weight 88.9 kg (196 lb).Body mass index is 24.5 kg/m.  General Appearance: casual, fairly groomed  Eye Contact:  Good  Speech:  Normal Rate  Volume:  Normal  Mood:  Remains depressed but improving  Affect:  Remains constricted   Thought Process:  Linear  Orientation:  Other:  alert and attentive   Thought Content:  Denies hallucinations, no delusions expressed, not internally preoccupied, anxious ruminations   Suicidal Thoughts:  No- contracts for safety  Homicidal Thoughts:  No no homicidal or violent ideations   Memory:  recent and remote grossly intact   Judgement:  improving   Insight:  Improving   Psychomotor Activity: increased, restless  Concentration:  Concentration: Good and Attention Span: Good  Recall:  Good  Fund of Knowledge:  Good  Language:  Good  Akathisia:  Negative  Handed:  Right  AIMS (if indicated):     Assets:  Desire for Improvement Resilience  ADL's:  Intact  Cognition:  WNL  Sleep:  Number of Hours: 6.5   Treatment Plan Summary: Severe episode of recurrent major depressive disorder, without psychotic features (HCC) unstable, managed as below:  Medications:  -Decrease Wellbutrin  XL to 150mg  mg daily for depression/motivation  -Continue   Klonopin 0.5 mg BID for acute anxiety -Continue Neurontin 600  mg TID for chronic anxiety, pain.  -Discontinue Zoloft -Continue Vistaril 25 mg Q 6 hours PRN for anxiety as needed  -Increase Remeron  To 30mg  po qhs for insomnia/MDD -Continue  Vitamin D supplementation to 1000 iu daily .   Non-pharmacologic:  -Encourage group , milieu participation to work on Pharmacologist and symptom reduction  Treatment team working on disposition planning options   Beau Fanny, [name] [facility] 07/01/2016, 10:18 AM

## 2016-07-01 NOTE — Progress Notes (Signed)
[name]. Christopher Giles was in his room and isolative most of the evening, did attend evening group activity. Christopher Giles has appeared anxious and irritable this evening and spoke of having a bad day and spoke about how he feels some of the medications may be having an adverse effect on him. Christopher Giles spoke about how he does not wish to take his wellbutrin in the morning and will speak to the doctor in regards to his medications. Christopher Giles did receive bedtime medications without incident and did not verbalize any complaints of pain. A. Support and encouragement provided. R. Safety maintained, will continue to monitor.

## 2016-07-01 NOTE — [facility] Group Notes (Signed)
[facility] LCSW Group Therapy 07/01/2016 1:15 PM Type of Therapy: Group Therapy Participation Level: Active  Participation Quality: Attentive  Affect: Appropriate  Cognitive: Alert and Oriented  Insight: Developing/Improving and Engaged  Engagement in Therapy: Developing/Improving and Engaged  Modes of Intervention: Activity, Clarification, Confrontation, Discussion, Education, Exploration, Limit-setting, Orientation, Problem-solving, Rapport Building, Reality Testing, Socialization and Support  Summary of Progress/Problems: Patient was attentive and engaged with speaker from [employer]. Patient was attentive to speaker while they shared their story of dealing with mental health and overcoming it. Patient expressed interest in their programs and services and received information on their agency. Patient processed ways they can relate to the speaker.   Boden Stucky, LCSW Clinical Social Worker [facility] [phone] 

## 2016-07-01 NOTE — Progress Notes (Signed)
[facility] Group Notes:  (Nursing/MHT/Case Management/Adjunct)  Date:  07/01/2016  Time:  3:51 PM  Type of Therapy:  Nurse Education  Participation Level:  Did Not Attend  Participation Quality:    Affect:    Cognitive:    Insight:    Engagement in Group:    Modes of Intervention:    Summary of Progress/Problems:  Beatrix ShipperWright, [place] Gipe Martin 07/01/2016, 3:51 PM

## 2016-07-01 NOTE — Progress Notes (Signed)
D: Patient reports increased irritability.  He requested that his wellbutrin be held until he speaks to the provider.  Patient reports increased depression and anxiety.  He states he feels "something is wrong.  I felt paranoid and anxious yesterday. I still feel very paranoid."  Patient's goal today is to get "my meds fixed."  He rates his depression and hopelessness as a 9; anxiety as a 10.  Patient is isolating today.  His affect is flat, blunted and his mood is irritable and withdrawn.  He denies any thoughts of self harm; AVH/HI. A: Continue to monitor medication management and MD orders.  Safety checks continued every 15 minutes per protocol.  Offer support and encouragement as needed. R: Patient is isolating to room; he is depressed and withdrawn.

## 2016-07-02 NOTE — [facility] Group Notes (Signed)
[facility] LCSW Group Therapy 07/02/2016 1:15 PM Type of Therapy: Group Therapy Participation Level: Active  Participation Quality: Attentive, Sharing and Supportive  Affect: Blunted  Cognitive: Alert and Oriented  Insight: Developing/Improving and Engaged  Engagement in Therapy: Developing/Improving and Engaged  Modes of Intervention: Clarification, Confrontation, Discussion, Education, Exploration, Limit-setting, Orientation, Problem-solving, Rapport Building, Dance movement psychotherapisteality Testing, Socialization and Support  Summary of Progress/Problems: The topic for today was feelings about relapse. Pt discussed what relapse prevention is to them and identified triggers that they are on the path to relapse. Pt processed their feeling towards relapse and was able to relate to peers. Pt discussed coping skills that can be used for relapse prevention.    Christopher BruinKristin Jackeline Giles, MSW, LCSW Clinical Social Worker [facility] [phone]

## 2016-07-02 NOTE — Tx Team (Signed)
Interdisciplinary Treatment and Diagnostic Plan Update  07/02/2016 Time of Session: 9:30am Christopher Giles MRN: [id number]  Principal Diagnosis: Severe episode of recurrent major depressive disorder, without psychotic features (HCC)  Secondary Diagnoses: Principal Problem:   Severe episode of recurrent major depressive disorder, without psychotic features (HCC) Active Problems:   Moderate episode of recurrent major depressive disorder (HCC)   Current Medications:  Current Facility-Administered Medications  Medication Dose Route Frequency Provider Last Rate Last Dose  . acetaminophen (TYLENOL) tablet 650 mg  650 mg Oral Q6H PRN Jackelyn Poling, NP   650 mg at 06/25/16 1951  . alum & mag hydroxide-simeth (MAALOX/MYLANTA) 200-200-20 MG/5ML suspension 30 mL  30 mL Oral Q4H PRN Jackelyn Poling, NP   30 mL at 06/25/16 1813  . buPROPion (WELLBUTRIN XL) 24 hr tablet 150 mg  150 mg Oral Daily Beau Fanny, [name]   150 mg at 07/01/16 1157  . cholecalciferol (VITAMIN D) tablet 1,000 Units  1,000 Units Oral Daily Craige Cotta, MD   1,000 Units at 07/01/16 [id number]  . clonazePAM (KLONOPIN) tablet 0.5 mg  0.5 mg Oral BID Craige Cotta, MD   0.5 mg at 07/01/16 1712  . gabapentin (NEURONTIN) capsule 600 mg  600 mg Oral TID Craige Cotta, MD   600 mg at 07/01/16 1712  . hydrOXYzine (ATARAX/VISTARIL) tablet 25 mg  25 mg Oral Q6H PRN Jackelyn Poling, NP   25 mg at 06/30/16 1310  . levothyroxine (SYNTHROID, LEVOTHROID) tablet 75 mcg  75 mcg Oral QAC breakfast Craige Cotta, MD   75 mcg at 07/02/16 1191  . magnesium citrate solution 1 Bottle  1 Bottle Oral Once PRN Rockey Situ Cobos, MD      . magnesium hydroxide (MILK OF MAGNESIA) suspension 30 mL  30 mL Oral Daily PRN Jackelyn Poling, NP   30 mL at 06/29/16 1829  . mirtazapine (REMERON) tablet 30 mg  30 mg Oral QHS Beau Fanny, [name]   30 mg at 07/01/16 2155  . nicotine (NICODERM CQ - dosed in mg/24 hours) patch 21 mg  21 mg Transdermal Daily Craige Cotta, MD   21 mg at 07/01/16 0824  . tamsulosin (FLOMAX) capsule 0.4 mg  0.4 mg Oral QHS Craige Cotta, MD   0.4 mg at 07/01/16 2155   PTA Medications: Prescriptions Prior to Admission  Medication Sig Dispense Refill Last Dose  . bismuth subsalicylate (PEPTO BISMOL) 262 MG chewable tablet Chew 524 mg by mouth as needed for indigestion or diarrhea or loose stools.   unk  . clonazePAM (KLONOPIN) 1 MG tablet Take 1 mg by mouth 3 (three) times daily as needed for anxiety.   06/12/2016 at Unknown time  . DULoxetine (CYMBALTA) 60 MG capsule Take 60 mg by mouth daily.   06/12/2016 at Unknown time  . ibuprofen (ADVIL,MOTRIN) 200 MG tablet Take 400 mg by mouth every 6 (six) hours as needed for moderate pain.   unk  . levothyroxine (SYNTHROID, LEVOTHROID) 50 MCG tablet Take 1 tablet (50 mcg total) by mouth daily before breakfast. (Patient taking differently: Take 75 mcg by mouth daily before breakfast. ) 30 tablet 1 06/12/2016 at Unknown time  . Menthol, Topical Analgesic, (BIOFREEZE ROLL-ON COLORLESS) 4 % GEL Apply 1 application topically daily as needed (pain).   unk  . Menthol-Methyl Salicylate (ICY HOT BALM EXTRA STRENGTH) 7.6-29 % OINT Apply 1 application topically daily as needed (pain).   unk  . naproxen sodium (ANAPROX)  220 MG tablet Take 220 mg by mouth 2 (two) times daily as needed (pain).   unk    Patient Stressors: Medication change or noncompliance Occupational concerns  Patient Strengths: Average or above average intelligence Motivation for treatment/growth Physical Health Supportive family/friends  Treatment Modalities: Medication Management, Group therapy, Case management,  1 to 1 session with clinician, Psychoeducation, Recreational therapy.   Physician Treatment Plan for Primary Diagnosis: Severe episode of recurrent major depressive disorder, without psychotic features (HCC) Long Term Goal(s): Improvement in symptoms so as ready for discharge Improvement in symptoms so as  ready for discharge   Short Term Goals: Ability to identify changes in lifestyle to reduce recurrence of condition will improve Ability to verbalize feelings will improve Ability to identify and develop effective coping behaviors will improve Compliance with prescribed medications will improve Ability to identify changes in lifestyle to reduce recurrence of condition will improve Ability to demonstrate self-control will improve Ability to maintain clinical measurements within normal limits will improve Ability to identify triggers associated with substance abuse/mental health issues will improve  Medication Management: Evaluate patient's response, side effects, and tolerance of medication regimen.  Therapeutic Interventions: 1 to 1 sessions, Unit Group sessions and Medication administration.  Evaluation of Outcomes: Progressing  Physician Treatment Plan for Secondary Diagnosis: Principal Problem:   Severe episode of recurrent major depressive disorder, without psychotic features (HCC) Active Problems:   Moderate episode of recurrent major depressive disorder (HCC)  Long Term Goal(s): Improvement in symptoms so as ready for discharge Improvement in symptoms so as ready for discharge   Short Term Goals: Ability to identify changes in lifestyle to reduce recurrence of condition will improve Ability to verbalize feelings will improve Ability to identify and develop effective coping behaviors will improve Compliance with prescribed medications will improve Ability to identify changes in lifestyle to reduce recurrence of condition will improve Ability to demonstrate self-control will improve Ability to maintain clinical measurements within normal limits will improve Ability to identify triggers associated with substance abuse/mental health issues will improve     Medication Management: Evaluate patient's response, side effects, and tolerance of medication regimen.  Therapeutic  Interventions: 1 to 1 sessions, Unit Group sessions and Medication administration.  Evaluation of Outcomes: Progressing   RN Treatment Plan for Primary Diagnosis: Severe episode of recurrent major depressive disorder, without psychotic features (HCC) Long Term Goal(s): Knowledge of disease and therapeutic regimen to maintain health will improve  Short Term Goals: Ability to disclose and discuss suicidal ideas and Ability to identify and develop effective coping behaviors will improve  Medication Management: RN will administer medications as ordered by provider, will assess and evaluate patient's response and provide education to patient for prescribed medication. RN will report any adverse and/or side effects to prescribing provider.  Therapeutic Interventions: 1 on 1 counseling sessions, Psychoeducation, Medication administration, Evaluate responses to treatment, Monitor vital signs and CBGs as ordered, Perform/monitor CIWA, COWS, AIMS and Fall Risk screenings as ordered, Perform wound care treatments as ordered.  Evaluation of Outcomes: Progressing   LCSW Treatment Plan for Primary Diagnosis: Severe episode of recurrent major depressive disorder, without psychotic features (HCC) Long Term Goal(s): Safe transition to appropriate next level of care at discharge, Engage patient in therapeutic group addressing interpersonal concerns.  Short Term Goals: Engage patient in aftercare planning with referrals and resources, Increase social support and Increase ability to appropriately verbalize feelings  Therapeutic Interventions: Assess for all discharge needs, 1 to 1 time with Social worker, Explore available resources and  support systems, Assess for adequacy in community support network, Educate family and significant other(s) on suicide prevention, Complete Psychosocial Assessment, Interpersonal group therapy.  Evaluation of Outcomes: Progressing   Progress in Treatment: Attending groups:  Yes. Participating in groups: Progressing Taking medication as prescribed: Yes. Toleration medication: Yes. Family/Significant other contact made: Yes with sister Patient understands diagnosis: Yes. Discussing patient identified problems/goals with staff: Yes. Medical problems stabilized or resolved: Yes. Denies suicidal/homicidal ideation: Treatment team continuing to assess Issues/concerns per patient self-inventory: No. Other: na  New problem(s) identified: No, Describe:  none at this time  New Short Term/Long Term Goal(s): None identified at this time.   Discharge Plan or Barriers: limited support network, unstable housing  06/18/16: Pt continues to be unsure if he can return to his sister's house; will follow-up with outpatient rescources  06/28/16: Pt reports that it is likely that he can return to his apartment. Pt has been offered resources such as [employer], [employer], and shelter resources.   10/20: Patient has been provided with information on emergency housing options and plans to follow up with outpatient services.   Reason for Continuation of Hospitalization: Depression Medication stabilization Suicidal ideation  Estimated Length of Stay: 2-4 days  Attendees: Patient: 07/02/2016   Physician: Richarda Overlie, Dr. Daleen Bo MD 07/02/2016   Nursing: Lenor Derrick., Jan W., RN 07/02/2016   RN Care Manager:  07/02/2016   Social Worker: Christopher Cruise Christopher Clabo, LCSW 07/02/2016   Recreational Therapist:  07/02/2016   Other: Tomasita Morrow, P4CC 07/02/2016   Other: Maura Crandall., May A., NP 07/02/2016   Other: 07/02/2016      Scribe for Treatment Team: Christopher Carbo Levi Crass, LCSW 07/02/2016

## 2016-07-02 NOTE — Progress Notes (Signed)
Recreation Therapy Notes  Date: 07/02/16 Time: 0930 Location: 300 Group Room  Group Topic: Stress Management  Goal Area(s) Addresses:  Patient will verbalize importance of using healthy stress management.  Patient will identify positive emotions associated with healthy stress management.   Intervention: Stress Management  Activity :  Peaceful Waves Imagery.  LRT introduced the stress management of guided imagery.  LRT read a script to allow patients to participate in the technique.  Patients were to follow along as LRT read script.  Education:  Stress Management, Discharge Planning.   Education Outcome: Acknowledges edcuation/In group clarification offered/Needs additional education  Clinical Observations/Feedback:  Pt did not attend group.    Caroll RancherMarjette Ethelmae Ringel, LRT/CTRS         Caroll RancherLindsay, Devynn Hessler A 07/02/2016 12:34 PM

## 2016-07-02 NOTE — Progress Notes (Signed)
D: Pt was in the day room upon initial approach.  Pt presents with preoccupied affect and depressed, preoccupied mood.  His goal for the night was to "stay up the rest of the evening" until bedtime.  Pt denies SI/HI, denies hallucinations, denies pain.  He does report having "paranoia, a lot, it's something that has been going on."  Pt has been visible in milieu interacting with peers and staff appropriately.  Pt attended evening group.   A: Introduced self to pt.  Actively listened to pt and offered support and encouragement. Medications administered per order.   R: Pt is safe on the unit.  Pt is compliant with medications.  Pt verbally contracts for safety.  Will continue to monitor and assess.

## 2016-07-02 NOTE — Progress Notes (Signed)
[facility] MD Progress Note  07/02/2016 10:16 AM Christopher Giles  MRN:  [id number]  Subjective:  Patient seen and notes reviewed. Reports that he continues to be anxious about resuming his life after being in the hospital. States that his living in an apartment and he isolates a lot. States that his trying to develop some coping skills asked to managing his life. Denies any suicidal thoughts. He does not have any problems with decreasing the Wellbutrin currently at 150 mg.  Principal Problem: Severe episode of recurrent major depressive disorder, without psychotic features (HCC) Diagnosis:   Patient Active Problem List   Diagnosis Date Noted  . Moderate episode of recurrent major depressive disorder (HCC) [F33.1]   . Severe episode of recurrent major depressive disorder, without psychotic features (HCC) [F33.2]    Total Time spent with patient: 25 minutes   Past Medical History:  Past Medical History:  Diagnosis Date  . Anxiety   . Depression   . Substance abuse   . Suicide attempt    x 6 per pt  . Thyroid disease    hypo    Past Surgical History:  Procedure Laterality Date  . punctured lung     Family History:  Family History  Problem Relation Age of Onset  . Hyperlipidemia Mother   . Cancer Mother   . Cancer Father   . Diabetes Maternal Grandmother     Social History:  History  Alcohol Use No     History  Drug Use No    Social History   Social History  . Marital status: Single    Spouse name: N/A  . Number of children: N/A  . Years of education: N/A   Social History Main Topics  . Smoking status: Current Every Day Smoker    Packs/day: 1.00    Types: Cigarettes  . Smokeless tobacco: Never Used  . Alcohol use No  . Drug use: No  . Sexual activity: Not Asked   Other Topics Concern  . None   Social History Narrative  . None   Additional Social History:   Sleep: gradually improving   Appetite:  Improving   Current Medications: Current  Facility-Administered Medications  Medication Dose Route Frequency Provider Last Rate Last Dose  . acetaminophen (TYLENOL) tablet 650 mg  650 mg Oral Q6H PRN Jackelyn Poling, NP   650 mg at 06/25/16 1951  . alum & mag hydroxide-simeth (MAALOX/MYLANTA) 200-200-20 MG/5ML suspension 30 mL  30 mL Oral Q4H PRN Jackelyn Poling, NP   30 mL at 06/25/16 1813  . buPROPion (WELLBUTRIN XL) 24 hr tablet 150 mg  150 mg Oral Daily Beau Fanny, [name]   150 mg at 07/02/16 4098  . cholecalciferol (VITAMIN D) tablet 1,000 Units  1,000 Units Oral Daily Craige Cotta, MD   1,000 Units at 07/02/16 [phone]  . clonazePAM (KLONOPIN) tablet 0.5 mg  0.5 mg Oral BID Craige Cotta, MD   0.5 mg at 07/02/16 0837  . gabapentin (NEURONTIN) capsule 600 mg  600 mg Oral TID Craige Cotta, MD   600 mg at 07/02/16 0837  . hydrOXYzine (ATARAX/VISTARIL) tablet 25 mg  25 mg Oral Q6H PRN Jackelyn Poling, NP   25 mg at 06/30/16 1310  . levothyroxine (SYNTHROID, LEVOTHROID) tablet 75 mcg  75 mcg Oral QAC breakfast Craige Cotta, MD   75 mcg at 07/02/16 4782  . magnesium citrate solution 1 Bottle  1 Bottle Oral Once PRN Madaline Guthrie A Cobos,  MD      . magnesium hydroxide (MILK OF MAGNESIA) suspension 30 mL  30 mL Oral Daily PRN Jackelyn PolingJason A Berry, NP   30 mL at 06/29/16 1829  . mirtazapine (REMERON) tablet 30 mg  30 mg Oral QHS Beau FannyJohn C Withrow, [name]   30 mg at 07/01/16 2155  . nicotine (NICODERM CQ - dosed in mg/24 hours) patch 21 mg  21 mg Transdermal Daily Craige CottaFernando A Cobos, MD   21 mg at 07/02/16 0837  . tamsulosin (FLOMAX) capsule 0.4 mg  0.4 mg Oral QHS Craige CottaFernando A Cobos, MD   0.4 mg at 07/01/16 2155    Lab Results:  No results found for this or any previous visit (from the past 48 hour(s)).  Blood Alcohol level:  Lab Results  Component Value Date   ETH <5 06/12/2016    Metabolic Disorder Labs: No results found for: HGBA1C, MPG No results found for: PROLACTIN No results found for: CHOL, TRIG, HDL, CHOLHDL, VLDL, LDLCALC  Physical  Findings: AIMS: Facial and Oral Movements Muscles of Facial Expression: None, normal Lips and Perioral Area: None, normal Jaw: None, normal Tongue: None, normal,Extremity Movements Upper (arms, wrists, hands, fingers): None, normal Lower (legs, knees, ankles, toes): None, normal, Trunk Movements Neck, shoulders, hips: None, normal, Overall Severity Severity of abnormal movements (highest score from questions above): None, normal Incapacitation due to abnormal movements: None, normal Patient's awareness of abnormal movements (rate only patient's report): No Awareness, Dental Status Current problems with teeth and/or dentures?: No Does patient usually wear dentures?: No  CIWA:  CIWA-Ar Total: 1 COWS:  COWS Total Score: 1  Musculoskeletal: Strength & Muscle Tone: within normal limits Gait & Station: normal Patient leans: N/A  Psychiatric Specialty Exam: Physical Exam  Nursing note and vitals reviewed. Psychiatric: He has a normal mood and affect. His speech is normal and behavior is normal. Judgment and thought content normal. Cognition and memory are normal.    Review of Systems  Constitutional: Negative.   HENT: Negative.   Eyes: Negative.   Respiratory: Negative.   Cardiovascular: Negative.   Gastrointestinal: Negative.   Genitourinary: Negative.   Musculoskeletal: Negative.   Skin: Negative.   Neurological: Negative.   Endo/Heme/Allergies: Negative.   Psychiatric/Behavioral: Positive for depression. Negative for hallucinations, substance abuse and suicidal ideas. The patient is nervous/anxious and has insomnia.   All other systems reviewed and are negative.  dizziness,  no chest pain or shortness of breath , no vomiting   Blood pressure 115/73, pulse 73, temperature 97.8 F (36.6 C), resp. rate 17, height 6\' 3"  (1.905 m), weight 196 lb (88.9 kg).Body mass index is 24.5 kg/m.  General Appearance: casual, fairly groomed  Eye Contact:  Good  Speech:  Normal Rate  Volume:   Normal  Mood:  Remains depressed but improving  Affect:  Remains constricted   Thought Process:  Linear  Orientation:  Other:  alert and attentive   Thought Content:  Denies hallucinations, no delusions expressed, not internally preoccupied, anxious ruminations   Suicidal Thoughts:  No- contracts for safety  Homicidal Thoughts:  No no homicidal or violent ideations   Memory:  recent and remote grossly intact   Judgement:  improving   Insight:  Improving   Psychomotor Activity: increased, restless  Concentration:  Concentration: Good and Attention Span: Good  Recall:  Good  Fund of Knowledge:  Good  Language:  Good  Akathisia:  Negative  Handed:  Right  AIMS (if indicated):     Assets:  Desire for Improvement Resilience  ADL's:  Intact  Cognition:  WNL  Sleep:  Number of Hours: 6.75   Treatment Plan Summary: Severe episode of recurrent major depressive disorder, without psychotic features (HCC) unstable, managed as below:  Medications:  - Wellbutrin XL to 150mg  mg daily for depression/motivation , decrease Wellbutrin to 75 mg over the weekend -Continue   Klonopin 0.5 mg BID for acute anxiety -Continue Neurontin 600  mg TID for chronic anxiety, pain.  -Discontinue Zoloft -Continue Vistaril 25 mg Q 6 hours PRN for anxiety as needed  -Continue Remeron  To 30mg  po qhs for insomnia/MDD -Continue  Vitamin D supplementation to 1000 iu daily .   Non-pharmacologic:  -Encourage group , milieu participation to work on coping skills and symptom reduction  Treatment team working on disposition planning options   Patrick North, MD [facility] 07/02/2016, 10:16 AM

## 2016-07-02 NOTE — Progress Notes (Signed)
D- Patient alert and oriented.  Patient appears anxious with a flat affect.  Patient currently denies SI, HI, AVH, and pain. Patient reports that he has been in a "rut for the last 3 years".  Patient went to the courtyard for recreation and reports that he shot basketball which really benefited him. Patient states "I use to play basketball all the time when I was younger and shooting today was a really good coping skill and it helped me a lot".  Patient was encourage to utilize the coping skill of basketball upon discharge.  Patient continues to verbalize some depression, feelings of hopelessness, and anxiety. On patient's self-inventory sheet, he rates his depression "6", feelings of hopelessness "8", and his anxiety "8" with 10 being the worst.   A- Scheduled medications administered to patient, per MD orders. Support and encouragement provided.  Routine safety checks conducted every 15 minutes.  Patient informed to notify staff with problems or concerns. R- No adverse drug reactions noted. Patient contracts for safety at this time. Patient compliant with medications and treatment plan. Patient receptive, calm, and cooperative. Patient interacts well with others on the unit.  Patient remains safe at this time.

## 2016-07-02 NOTE — Plan of Care (Signed)
Problem: Activity: Goal: Sleeping patterns will improve Outcome: Progressing Pt slept 6.5 hours last night according to flowsheet.    

## 2016-07-03 DIAGNOSIS — Z8489 Family history of other specified conditions: Secondary | ICD-10-CM

## 2016-07-03 MED ORDER — METHOCARBAMOL 500 MG PO TABS
500.0000 mg | ORAL_TABLET | Freq: Four times a day (QID) | ORAL | Status: DC | PRN
  Administered 2016-07-03 – 2016-07-06 (×8): 500 mg via ORAL
  Filled 2016-07-03 (×9): qty 1

## 2016-07-03 NOTE — Progress Notes (Signed)
D: Pt was in the day room upon initial approach.  Pt presents with anxious, depressed affect and depressed mood.  He reports he "had a really good day."  Pt reports it was his "first time going outdoors twice, instead of feeling kinda alone I interacted very well, best day since I been here."  Pt denies SI/HI, denies hallucinations, denies pain.  Pt has been visible in milieu interacting with peers and staff appropriately.  Pt attended evening group.   A: Actively listened to pt and offered support and encouragement. Medications administered per order.   R: Pt is safe on the unit.  Pt is compliant with medications.  Pt verbally contracts for safety.  Will continue to monitor and assess.

## 2016-07-03 NOTE — [facility] Group Notes (Signed)
Adult Psychoeducational Group Note  Date:  07/03/2016 Time:  0900-0945  Group Topic/Focus:  Goals Group/Healthy Coping Skills:   The focus of this group is to help patients establish daily goals to achieve during treatment and discuss how the patient can incorporate goal setting into their daily lives to aide in recovery.  Additionally we created a list of health coping skills on the board for reference to later.  At the end of group we did 2 minutes of deep breathing exercises.  Participation Level:  Active  Participation Quality:  Appropriate and Attentive  Engagement in Group:  Appropriate  Modes of Intervention:  Discussion, Education, Rapport Building, Socialization and Support  Additional Comments:  Patient shared goal and 1 coping skill, attended for entire duration of group. 

## 2016-07-03 NOTE — Progress Notes (Signed)
D- Patient appears anxious with flat affect.  Patient currently denies SI, HI, AVH, and pain.  Patient reports increased energy.  Patient has been attending and participating in groups.  On patient self inventory, patient rates his depression "5", feelings of hopelessness "5", and anxiety "5".  Patient's goal for today is to "stay up as many as possible".   A- Scheduled medications administered to patient, per MD orders. Support and encouragement provided.  Routine safety checks conducted every 15 minutes.  Patient informed to notify staff with problems or concerns. R- No adverse drug reactions noted. Patient contracts for safety at this time. Patient compliant with medications and treatment plan. Patient receptive, calm, and cooperative. Patient interacts well with others on the unit.  Patient remains safe at this time.

## 2016-07-03 NOTE — Progress Notes (Signed)
[facility] MD Progress Note  07/03/2016 2:06 PM Christopher Giles  MRN:  [id number]   Subjective:  Patient reports " my lower back is hurting and the heating pads are not helping."  Objective: Christopher Giles is awake, alert and oriented *4. Seen  attending group session.  Denies suicidal or homicidal ideation. Denies auditory or visual hallucination and does not appear to be responding to internal stimuli. Patient interacts well with staff and others. Patient reports he is medication compliant without mediation side effects. Patient has complaints of chronic lower back pain.  States his depression 8/10.  Reports good appetite and reports resting well. Support, encouragement and reassurance was provided.   Principal Problem: Severe episode of recurrent major depressive disorder, without psychotic features (HCC) Diagnosis:   Patient Active Problem List   Diagnosis Date Noted  . Moderate episode of recurrent major depressive disorder (HCC) [F33.1]   . Severe episode of recurrent major depressive disorder, without psychotic features (HCC) [F33.2]    Total Time spent with patient: 25 minutes   Past Medical History:  Past Medical History:  Diagnosis Date  . Anxiety   . Depression   . Substance abuse   . Suicide attempt    x 6 per pt  . Thyroid disease    hypo    Past Surgical History:  Procedure Laterality Date  . punctured lung     Family History:  Family History  Problem Relation Age of Onset  . Hyperlipidemia Mother   . Cancer Mother   . Cancer Father   . Diabetes Maternal Grandmother     Social History:  History  Alcohol Use No     History  Drug Use No    Social History   Social History  . Marital status: Single    Spouse name: N/A  . Number of children: N/A  . Years of education: N/A   Social History Main Topics  . Smoking status: Current Every Day Smoker    Packs/day: 1.00    Types: Cigarettes  . Smokeless tobacco: Never Used  . Alcohol use No  . Drug use: No  .  Sexual activity: Not Asked   Other Topics Concern  . None   Social History Narrative  . None   Additional Social History:   Sleep: gradually improving   Appetite:  Improving   Current Medications: Current Facility-Administered Medications  Medication Dose Route Frequency Provider Last Rate Last Dose  . acetaminophen (TYLENOL) tablet 650 mg  650 mg Oral Q6H PRN Jackelyn Poling, NP   650 mg at 06/25/16 1951  . alum & mag hydroxide-simeth (MAALOX/MYLANTA) 200-200-20 MG/5ML suspension 30 mL  30 mL Oral Q4H PRN Jackelyn Poling, NP   30 mL at 06/25/16 1813  . buPROPion (WELLBUTRIN XL) 24 hr tablet 150 mg  150 mg Oral Daily Beau Fanny, [name]   150 mg at 07/03/16 0956  . cholecalciferol (VITAMIN D) tablet 1,000 Units  1,000 Units Oral Daily Craige Cotta, MD   1,000 Units at 07/03/16 0956  . clonazePAM (KLONOPIN) tablet 0.5 mg  0.5 mg Oral BID Rockey Situ Cobos, MD   0.5 mg at 07/03/16 0800  . gabapentin (NEURONTIN) capsule 600 mg  600 mg Oral TID Craige Cotta, MD   600 mg at 07/03/16 1253  . hydrOXYzine (ATARAX/VISTARIL) tablet 25 mg  25 mg Oral Q6H PRN Jackelyn Poling, NP   25 mg at 06/30/16 1310  . levothyroxine (SYNTHROID, LEVOTHROID) tablet 75 mcg  75 mcg Oral QAC breakfast Craige Cotta, MD   75 mcg at 07/03/16 0644  . magnesium citrate solution 1 Bottle  1 Bottle Oral Once PRN Rockey Situ Cobos, MD      . magnesium hydroxide (MILK OF MAGNESIA) suspension 30 mL  30 mL Oral Daily PRN Jackelyn Poling, NP   30 mL at 06/29/16 1829  . mirtazapine (REMERON) tablet 30 mg  30 mg Oral QHS Beau Fanny, [name]   30 mg at 07/02/16 2219  . nicotine (NICODERM CQ - dosed in mg/24 hours) patch 21 mg  21 mg Transdermal Daily Craige Cotta, MD   21 mg at 07/03/16 0800  . tamsulosin (FLOMAX) capsule 0.4 mg  0.4 mg Oral QHS Craige Cotta, MD   0.4 mg at 07/02/16 2219    Lab Results:  No results found for this or any previous visit (from the past 48 hour(s)).  Blood Alcohol level:  Lab Results   Component Value Date   ETH <5 06/12/2016    Metabolic Disorder Labs: No results found for: HGBA1C, MPG No results found for: PROLACTIN No results found for: CHOL, TRIG, HDL, CHOLHDL, VLDL, LDLCALC  Physical Findings: AIMS: Facial and Oral Movements Muscles of Facial Expression: None, normal Lips and Perioral Area: None, normal Jaw: None, normal Tongue: None, normal,Extremity Movements Upper (arms, wrists, hands, fingers): None, normal Lower (legs, knees, ankles, toes): None, normal, Trunk Movements Neck, shoulders, hips: None, normal, Overall Severity Severity of abnormal movements (highest score from questions above): None, normal Incapacitation due to abnormal movements: None, normal Patient's awareness of abnormal movements (rate only patient's report): No Awareness, Dental Status Current problems with teeth and/or dentures?: No Does patient usually wear dentures?: No  CIWA:  CIWA-Ar Total: 1 COWS:  COWS Total Score: 1  Musculoskeletal: Strength & Muscle Tone: within normal limits Gait & Station: normal Patient leans: N/A  Psychiatric Specialty Exam: Physical Exam  Nursing note and vitals reviewed. Constitutional: He appears well-developed and well-nourished.  Musculoskeletal: Normal range of motion.  Psychiatric: He has a normal mood and affect. His speech is normal and behavior is normal. Judgment and thought content normal. Cognition and memory are normal.    Review of Systems  Constitutional: Negative.   HENT: Negative.   Eyes: Negative.   Respiratory: Negative.   Cardiovascular: Negative.   Gastrointestinal: Negative.   Genitourinary: Negative.   Musculoskeletal: Negative.   Skin: Negative.   Neurological: Negative.   Endo/Heme/Allergies: Negative.   Psychiatric/Behavioral: Positive for depression. Negative for hallucinations, substance abuse and suicidal ideas. The patient is nervous/anxious and has insomnia.   All other systems reviewed and are  negative.  dizziness,  no chest pain or shortness of breath , no vomiting   Blood pressure 107/74, pulse 91, temperature 97.7 F (36.5 C), resp. rate 18, height 6\' 3"  (1.905 m), weight 88.9 kg (196 lb).Body mass index is 24.5 kg/m.  General Appearance: casual, fairly groomed  Eye Contact:  Good  Speech:  Normal Rate  Volume:  Normal  Mood:  Remains depressed but improving  Affect:  Remains constricted   Thought Process:  Linear  Orientation:  Other:  alert and attentive   Thought Content:  Denies hallucinations, no delusions expressed, not internally preoccupied, anxious ruminations   Suicidal Thoughts:  No  Homicidal Thoughts:  No   Memory:  recent and remote grossly intact   Judgement:  improving   Insight:  Improving   Psychomotor Activity:restlessness   Concentration:  Concentration: Good  and Attention Span: Good  Recall:  Good  Fund of Knowledge:  Good  Language:  Good  Akathisia:  Negative  Handed:  Right  AIMS (if indicated):     Assets:  Desire for Improvement Resilience  ADL's:  Intact  Cognition:  WNL  Sleep:  Number of Hours: 6.75     I agree with current treatment plan on 07/03/2016, Patient seen face-to-face for psychiatric evaluation follow-up, chart reviewed. Reviewed the information documented and agree with the treatment plan.  Treatment Plan Summary: Severe episode of recurrent major depressive disorder, without psychotic features (HCC) unstable, managed as below: Medications:  -Start Robaxin 500 mg PO Q6 PRN for spams - Wellbutrin XL to 150mg  mg daily for depression/motivation , decrease Wellbutrin to 75 mg over the weekend -Continue   Klonopin 0.5 mg BID for acute anxiety -Continue Neurontin 600  mg TID for chronic anxiety, pain.  -Discontinue Zoloft -Continue Vistaril 25 mg Q 6 hours PRN for anxiety as needed  -Continue Remeron  To 30mg  po qhs for insomnia/MDD -Continue  Vitamin D supplementation to 1000 iu daily .  -Encourage group , milieu  participation to work on Pharmacologistcoping skills and symptom reduction  Treatment team working on disposition planning options   Oneta Rackanika N Lewis, NP [facility] 07/03/2016, 2:06 PM  I agree with findings and treatment plan of this patient

## 2016-07-03 NOTE — [facility] Group Notes (Signed)
[facility] Group Notes:  (Clinical Social Work)   07/03/2016      10:00-11:00AM  Summary of Progress/Problems:   Today's process group explored the perceived benefits and costs of unhealthy coping techniques, as well as the benefits and costs of replacing these with healthy coping skills.  Patients provided support to one another as they shared feelings of being overwhelmed, scared, and unable to make a decision about whether to relinquish a specific unhealthy coping technique.  Motivational Interviewing was utilized to highlight patients' ambivalence and an explanation of the Stages of Change was made to highlight that each patient must determine their willingness and motivation to change individually.  Celebrating incremental successes was discussed as a method to address fear of failure.  At the beginning of group the patient expressed that one unhealthy coping technique they often use is staying in his apartment and severely isolating himself.  He said that thinking about changing pretty much anything in his life feels very overwhelming and scary.  He spoke little in group, but did track the conversation and appeared to be interested.  Type of Therapy:  Group Therapy - Process   Participation Level:  Active  Participation Quality:  Attentive and Sharing  Affect:  Anxious, Depressed and Flat  Cognitive:  Appropriate and Oriented  Insight:  Developing/Improving  Engagement in Therapy:  Engaged  Modes of Intervention:  Education, Motivational Interviewing  Ambrose MantleMareida Grossman-Orr, [place]LCS[place] 12:38 PM    07/03/2016

## 2016-07-03 NOTE — Progress Notes (Signed)
D- Patient appears anxious and is observed in the milieu laughing and interacting well with peers. Patient currently denies SI and HI. Contracts for safety.  Patient had complaints of muscle spasms and reports Robaxin, given per MD order, was effective in offering relief (See MAR).  Patient has concerns regarding if he will be sent home with a prescription for Klonopin.  Patient reports he use to take it and states "it helps a lot". No other complaints. A- Support and encouragement provided.  Routine safety checks conducted every 15 minutes.  R- Patient remains safe at this time.

## 2016-07-04 MED ORDER — SENNOSIDES-DOCUSATE SODIUM 8.6-50 MG PO TABS
1.0000 | ORAL_TABLET | Freq: Every morning | ORAL | Status: DC
  Administered 2016-07-04 – 2016-07-06 (×3): 1 via ORAL
  Filled 2016-07-04 (×6): qty 1

## 2016-07-04 NOTE — Progress Notes (Signed)
[facility] MD Progress Note  07/04/2016 11:49 AM Christopher Giles  MRN:  [id number]   Subjective:  Patient reports " I want to be discharged back to my house, I have been her for the past 2weeks.'  Objective: [name] is awake, alert and oriented *4. Seen  attending group session.  Denies suicidal or homicidal ideation. Denies auditory or visual hallucination and does not appear to be responding to internal stimuli. Patient reports increasing anxiety regarding discharge and follow-up out patient plan.  Patient interacts well with staff and others. Patient reports he is medication compliant without mediation side effects. States his depression 8/10.  Reports good appetite and reports resting well. Support, encouragement and reassurance was provided.   Principal Problem: Severe episode of recurrent major depressive disorder, without psychotic features (HCC) Diagnosis:   Patient Active Problem List   Diagnosis Date Noted  . Moderate episode of recurrent major depressive disorder (HCC) [F33.1]   . Severe episode of recurrent major depressive disorder, without psychotic features (HCC) [F33.2]    Total Time spent with patient: 25 minutes   Past Medical History:  Past Medical History:  Diagnosis Date  . Anxiety   . Depression   . Substance abuse   . Suicide attempt    x 6 per pt  . Thyroid disease    hypo    Past Surgical History:  Procedure Laterality Date  . punctured lung     Family History:  Family History  Problem Relation Age of Onset  . Hyperlipidemia Mother   . Cancer Mother   . Cancer Father   . Diabetes Maternal Grandmother     Social History:  History  Alcohol Use No     History  Drug Use No    Social History   Social History  . Marital status: Single    Spouse name: N/A  . Number of children: N/A  . Years of education: N/A   Social History Main Topics  . Smoking status: Current Every Day Smoker    Packs/day: 1.00    Types: Cigarettes  . Smokeless  tobacco: Never Used  . Alcohol use No  . Drug use: No  . Sexual activity: Not Asked   Other Topics Concern  . None   Social History Narrative  . None   Additional Social History:   Sleep: gradually improving   Appetite:  Improving   Current Medications: Current Facility-Administered Medications  Medication Dose Route Frequency Provider Last Rate Last Dose  . acetaminophen (TYLENOL) tablet 650 mg  650 mg Oral Q6H PRN Jackelyn Poling, NP   650 mg at 07/04/16 0836  . alum & mag hydroxide-simeth (MAALOX/MYLANTA) 200-200-20 MG/5ML suspension 30 mL  30 mL Oral Q4H PRN Jackelyn Poling, NP   30 mL at 06/25/16 1813  . buPROPion (WELLBUTRIN XL) 24 hr tablet 150 mg  150 mg Oral Daily Beau Fanny, [name]   150 mg at 07/04/16 0834  . cholecalciferol (VITAMIN D) tablet 1,000 Units  1,000 Units Oral Daily Craige Cotta, MD   1,000 Units at 07/04/16 [phone]  . clonazePAM (KLONOPIN) tablet 0.5 mg  0.5 mg Oral BID Craige Cotta, MD   0.5 mg at 07/04/16 0834  . gabapentin (NEURONTIN) capsule 600 mg  600 mg Oral TID Craige Cotta, MD   600 mg at 07/04/16 0834  . hydrOXYzine (ATARAX/VISTARIL) tablet 25 mg  25 mg Oral Q6H PRN Jackelyn Poling, NP   25 mg at 06/30/16 1310  .  levothyroxine (SYNTHROID, LEVOTHROID) tablet 75 mcg  75 mcg Oral QAC breakfast Craige Cotta, MD   75 mcg at 07/04/16 0630  . magnesium citrate solution 1 Bottle  1 Bottle Oral Once PRN Rockey Situ Cobos, MD      . magnesium hydroxide (MILK OF MAGNESIA) suspension 30 mL  30 mL Oral Daily PRN Jackelyn Poling, NP   30 mL at 06/29/16 1829  . methocarbamol (ROBAXIN) tablet 500 mg  500 mg Oral Q6H PRN Oneta Rack, NP   500 mg at 07/04/16 0836  . mirtazapine (REMERON) tablet 30 mg  30 mg Oral QHS Beau Fanny, [name]   30 mg at 07/03/16 2157  . nicotine (NICODERM CQ - dosed in mg/24 hours) patch 21 mg  21 mg Transdermal Daily Craige Cotta, MD   21 mg at 07/04/16 0837  . senna-docusate (Senokot-S) tablet 1 tablet  1 tablet Oral q morning  - 10a Oneta Rack, NP      . tamsulosin (FLOMAX) capsule 0.4 mg  0.4 mg Oral QHS Craige Cotta, MD   0.4 mg at 07/03/16 2157    Lab Results:  No results found for this or any previous visit (from the past 48 hour(s)).  Blood Alcohol level:  Lab Results  Component Value Date   ETH <5 06/12/2016    Metabolic Disorder Labs: No results found for: HGBA1C, MPG No results found for: PROLACTIN No results found for: CHOL, TRIG, HDL, CHOLHDL, VLDL, LDLCALC  Physical Findings: AIMS: Facial and Oral Movements Muscles of Facial Expression: None, normal Lips and Perioral Area: None, normal Jaw: None, normal Tongue: None, normal,Extremity Movements Upper (arms, wrists, hands, fingers): None, normal Lower (legs, knees, ankles, toes): None, normal, Trunk Movements Neck, shoulders, hips: None, normal, Overall Severity Severity of abnormal movements (highest score from questions above): None, normal Incapacitation due to abnormal movements: None, normal Patient's awareness of abnormal movements (rate only patient's report): No Awareness, Dental Status Current problems with teeth and/or dentures?: No Does patient usually wear dentures?: No  CIWA:  CIWA-Ar Total: 1 COWS:  COWS Total Score: 1  Musculoskeletal: Strength & Muscle Tone: within normal limits Gait & Station: normal Patient leans: N/A  Psychiatric Specialty Exam: Physical Exam  Nursing note and vitals reviewed. Constitutional: He is oriented to person, place, and time. He appears well-developed and well-nourished.  Cardiovascular: Regular rhythm.   Musculoskeletal: Normal range of motion.  Neurological: He is alert and oriented to person, place, and time.  Skin: Skin is warm and dry.  Psychiatric: He has a normal mood and affect. His speech is normal and behavior is normal. Cognition and memory are normal.    Review of Systems  Psychiatric/Behavioral: Positive for depression. Negative for hallucinations, substance abuse  and suicidal ideas. The patient is nervous/anxious and has insomnia.   All other systems reviewed and are negative.  dizziness,  no chest pain or shortness of breath , no vomiting   Blood pressure 100/67, pulse 87, temperature 97.4 F (36.3 C), resp. rate 20, height 6\' 3"  (1.905 m), weight 88.9 kg (196 lb).Body mass index is 24.5 kg/m.  General Appearance: casual, fairly groomed  Eye Contact:  Good  Speech:  Normal Rate  Volume:  Normal  Mood:  Remains depressed but improving  Affect:  Remains constricted   Thought Process:  Linear  Orientation:  Other:  alert and attentive   Thought Content:  Denies hallucinations, no delusions expressed, not internally preoccupied, anxious ruminations   Suicidal  Thoughts:  No  Homicidal Thoughts:  No   Memory:  recent and remote grossly intact   Judgement:  improving   Insight:  Improving   Psychomotor Activity:restlessness   Concentration:  Concentration: Good and Attention Span: Good  Recall:  Good  Fund of Knowledge:  Good  Language:  Good  Akathisia:  Negative  Handed:  Right  AIMS (if indicated):     Assets:  Desire for Improvement Resilience  ADL's:  Intact  Cognition:  WNL  Sleep:  Number of Hours: 6.75     I agree with current treatment plan on 07/04/2016, Patient seen face-to-face for psychiatric evaluation follow-up, chart reviewed. Reviewed the information documented and agree with the treatment plan.  Treatment Plan Summary: Severe episode of recurrent major depressive disorder, without psychotic features (HCC) unstable, managed as below: Medications:  -Continue Robaxin 500 mg PO Q6 PRN for spasm - Wellbutrin XL to 150mg  mg daily for depression/motivation , decrease Wellbutrin to 75 mg over the weekend -Continue   Klonopin 0.5 mg BID for acute anxiety -Continue Neurontin 600  mg TID for chronic anxiety, pain.  -Discontinue Zoloft -Continue Vistaril 25 mg Q 6 hours PRN for anxiety as needed  -Continue Remeron  To 30mg  po  qhs for insomnia/MDD -Continue  Vitamin D supplementation to 1000 iu daily .  -Encourage group , milieu participation to work on Pharmacologistcoping skills and symptom reduction  Treatment team working on disposition planning options   Oneta Rackanika N Lewis, NP [facility] 07/04/2016, 11:49 AM   I agree with findings and treatment plan of this patient

## 2016-07-04 NOTE — Progress Notes (Addendum)
Patient ID: Christopher BanningJames C Giles, male   DOB: 1965-10-09, 50 y.o.   MRN: [id number]  DAR: Pt. Denies SI/HI and A/V Hallucinations. He reports sleep is fair, appetite is good, energy level is normal, and concentration is poor. He rates depression 8/10, hopelessness 6/10, and anxiety 6/10. Support and encouragement provided to the patient to stay in the milieu and work on decreasing isolation time. Patient stated, "that's fair." Patient came to the medication window this afternoon and began to cry stating he has "waves" of anxiety. He is unable to pinpoint what his triggers are at this time. He reports that today is not a good day but, "I know I can't stay here forever." "Will you write this in a note? They surely won't discharge someone looking like this or at least I don't think they will." He has a tendency to look to staff for guidance on matters that appear simple to writer such as how to navigate the day and whether or not to take Tylenol. Scheduled medications administered to patient per physician's orders. PRN medications administered when patient is having pain. Q15 minute checks are maintained for safety.

## 2016-07-04 NOTE — [facility] Group Notes (Signed)
Adult Therapy Group Note  Date: 07/04/2016 Time:  10:00-11:00AM  Group Topic/Focus: Healthy Support Systems to meet patient needs  Building Self Esteem:   The focus of this group was to assist patients in identifying some of their most prominent needs, then to generate ideas about healthy supports that will be needed, as well as how to go about securing these.   A Values Card Sorting Task was used in small groups and then discussed in the larger group.  Patients themselves placed an emphasis on the use of AA/NA, problem-specific support groups, doctors, counselors, and self, while CSW placed an emphasis on following up with the discharge plan.     Participation Level:  Active  Participation Quality:  Attentive and Sharing  Affect:  Blunted and Depressed  Cognitive:  Alert, Appropriate and Oriented  Insight: Good  Engagement in Group:  Engaged  Modes of Intervention:  Activity, Discussion and Support  Additional Comments:  The patient expressed that an important goal/need in his life is to have a clearer mind and to have less stress in his life.  He was very interested in the connection between following through on the discharge plan immediately ("and not just sitting around waiting for it to come to me") and gradually meeting the 2 stated goals with the help of his supports.  Lynnell ChadMareida J Grossman-Orr, LCSW 07/04/2016 12:28 PM

## 2016-07-04 NOTE — Progress Notes (Signed)
Adult Psychoeducational Group Note  Date:  07/04/2016 Time:  10:03 PM  Group Topic/Focus:  Wrap-Up Group:   The focus of this group is to help patients review their daily goal of treatment and discuss progress on daily workbooks.   Participation Level:  Active  Participation Quality:  Appropriate  Affect:  Appropriate  Cognitive:  Alert, Appropriate and Oriented  Insight: Appropriate  Engagement in Group:  Engaged  Modes of Intervention:  Discussion  Additional Comments:  Patient attended group and said that his day was a 2. He had problems with his medications therefore his goal was to talk to his nurse and he did. His coping skills for today was playing cards, socializing and watch tv.  Arena Lindahl W Koral Thaden 07/04/2016, 10:03 PM

## 2016-07-04 NOTE — [facility] Group Notes (Signed)
[facility] Group Notes:  Healthy Support Systems   Date:  07/04/2016  Time:  11:32 AM  Type of Therapy:  Psychoeducational Skills  Participation Level:  Active  Participation Quality:  Appropriate  Affect:  Depressed  Cognitive:  Appropriate  Insight:  Limited  Engagement in Group:  Improving  Modes of Intervention:  Discussion and Education  Summary of Progress/Problems: Christopher Giles attended group and was engaged. He asked for clarification about the meaning of enmeshed. He reports understanding after Clinical research associatewriter gave example.   Christopher Giles, Christopher Giles 07/04/2016, 11:32 AM

## 2016-07-05 MED ORDER — LITHIUM CARBONATE 300 MG PO CAPS
450.0000 mg | ORAL_CAPSULE | Freq: Every day | ORAL | Status: DC
  Administered 2016-07-05: 450 mg via ORAL
  Filled 2016-07-05 (×4): qty 1

## 2016-07-05 NOTE — Progress Notes (Signed)
Adult Psychoeducational Group Note  Date:  07/05/2016 Time:  8:52 PM  Group Topic/Focus:  Wrap-Up Group:   The focus of this group is to help patients review their daily goal of treatment and discuss progress on daily workbooks.   Participation Level:  Active  Participation Quality:  Appropriate  Affect:  Appropriate  Cognitive:  Alert  Insight: Appropriate  Engagement in Group:  Engaged  Modes of Intervention:  Discussion  Additional Comments:  Patient states that his day started off rough. Patient is currently dealing with the death of his mom. Patient's goal for today was to see the doctor to have medication adjusted.  Klayton Monie L Albie Bazin 07/05/2016, 8:52 PM

## 2016-07-05 NOTE — [facility] Group Notes (Addendum)
  [facility] LCSW Group Therapy 07/05/2016  1:15 pm   Type of Therapy: Group Therapy Participation Level: Minimal  Participation Quality: Attentive  Affect: Blunted  Cognitive: Alert and Oriented  Insight: Developing/Improving and Engaged  Engagement in Therapy: Developing/Improving and Engaged  Modes of Intervention: Clarification, Confrontation, Discussion, Education, Exploration, Limit-setting, Orientation, Problem-solving, Rapport Building, Reality Testing, Socialization and Support  Summary of Progress/Problems: The topic for group was balance in life. Today's group focused on defining balance in one's own words, identifying things that can knock one off balance, and exploring healthy ways to maintain balance in life. Group members were asked to provide an example of a time when they felt off balance, describe how they handled that situation,and process healthier ways to regain balance in the future. Group members were asked to share the most important tool for maintaining balance that they learned while at [facility] and how they plan to apply this method after discharge. Patient participated in therapeutic activity but participated minimally in discussion.   Benjamen Koelling, MSW, LCSW Clinical Social Worker  [facility] [phone]    

## 2016-07-05 NOTE — Progress Notes (Addendum)
D: Patient continues to report severe anxiety and depression.  He states, "I'm still paranoid and anxious around people."  "I need a complete medication readjustment."  Patient has been in bed, only getting up for meals.  He is complaining of back pain.  He rates his depression and hopelessness as an 8; anxiety as a 10. Patient denies any thoughts of self harm; AVH/HI.  His goal is to "get doctor to look at my meds (make changes) and allow me to attend AA meetings here. A: Continue to monitor medication management and MD orders.  Safety checks completed every 15 minutes per protocol.   Offer support and encouragement as needed.   R: Patient is receptive to staff; his behavior is appropriate.

## 2016-07-05 NOTE — Progress Notes (Signed)
D   Spoke with patient and he was tearful and anxious and said he felt like he needed something else to help his mood   He expressed anxiety and fear of loosing his mother who has been his sole support for many years    He said he has had good days since he has been here but isnt sure what has happened to cause his mood to change   We also talked about his affiliation with AA and how good he felt while going to the meetings and how supportive it was but he had stopped going to the meetings    He agreed to start back up with the meetings because he feels like it will benefit him and help with his depression A    Verbal support given    Medications administered and effectiveness monitored     Q 15 min checks R   Pt is safe and receptive to verbal support

## 2016-07-05 NOTE — Progress Notes (Signed)
[facility] MD Progress Note  07/05/2016 3:32 PM Christopher Giles  MRN:  [id number]   Subjective:  Patient reports not feeling any better,still very depressed. Patient's mother died last week and he says he is dealing with it the best way he can.  Sister is helping him with his rent and light bill so he has a home to go to. Patient will like to attend AA meetings on the [place] side  Objective: Christopher Giles is awake, alert and oriented *4. Seen  attending group session.  Denies suicidal or homicidal ideation. Denies auditory or visual hallucination and does not appear to be responding to internal stimuli. Patient reports increasing anxiety regarding discharge and follow-up out patient plan.  Patient interacts well with staff and others. Patient reports he is medication compliant without mediation side effects. States his depression 8/10.  Reports good appetite and reports resting well. Support, encouragement and reassurance was provided.   Principal Problem: Severe episode of recurrent major depressive disorder, without psychotic features (HCC) Diagnosis:   Patient Active Problem List   Diagnosis Date Noted  . Moderate episode of recurrent major depressive disorder (HCC) [F33.1]   . Severe episode of recurrent major depressive disorder, without psychotic features (HCC) [F33.2]    Total Time spent with patient: 25 minutes   Past Medical History:  Past Medical History:  Diagnosis Date  . Anxiety   . Depression   . Substance abuse   . Suicide attempt    x 6 per pt  . Thyroid disease    hypo    Past Surgical History:  Procedure Laterality Date  . punctured lung     Family History:  Family History  Problem Relation Age of Onset  . Hyperlipidemia Mother   . Cancer Mother   . Cancer Father   . Diabetes Maternal Grandmother     Social History:  History  Alcohol Use No     History  Drug Use No    Social History   Social History  . Marital status: Single    Spouse name: N/A  . Number  of children: N/A  . Years of education: N/A   Social History Main Topics  . Smoking status: Current Every Day Smoker    Packs/day: 1.00    Types: Cigarettes  . Smokeless tobacco: Never Used  . Alcohol use No  . Drug use: No  . Sexual activity: Not Asked   Other Topics Concern  . None   Social History Narrative  . None   Additional Social History:   Sleep: gradually improving   Appetite:  Improving   Current Medications: Current Facility-Administered Medications  Medication Dose Route Frequency Provider Last Rate Last Dose  . acetaminophen (TYLENOL) tablet 650 mg  650 mg Oral Q6H PRN Jackelyn Poling, NP   650 mg at 07/05/16 1204  . alum & mag hydroxide-simeth (MAALOX/MYLANTA) 200-200-20 MG/5ML suspension 30 mL  30 mL Oral Q4H PRN Jackelyn Poling, NP   30 mL at 06/25/16 1813  . buPROPion (WELLBUTRIN XL) 24 hr tablet 150 mg  150 mg Oral Daily Beau Fanny, [name]   150 mg at 07/05/16 0817  . cholecalciferol (VITAMIN D) tablet 1,000 Units  1,000 Units Oral Daily Craige Cotta, MD   1,000 Units at 07/05/16 0817  . clonazePAM (KLONOPIN) tablet 0.5 mg  0.5 mg Oral BID Craige Cotta, MD   0.5 mg at 07/05/16 0817  . gabapentin (NEURONTIN) capsule 600 mg  600 mg Oral  TID Craige Cotta, MD   600 mg at 07/05/16 1202  . hydrOXYzine (ATARAX/VISTARIL) tablet 25 mg  25 mg Oral Q6H PRN Jackelyn Poling, NP   25 mg at 06/30/16 1310  . levothyroxine (SYNTHROID, LEVOTHROID) tablet 75 mcg  75 mcg Oral QAC breakfast Craige Cotta, MD   75 mcg at 07/05/16 4782  . lithium carbonate capsule 450 mg  450 mg Oral QHS Molinda Bailiff, MD      . magnesium citrate solution 1 Bottle  1 Bottle Oral Once PRN Craige Cotta, MD      . magnesium hydroxide (MILK OF MAGNESIA) suspension 30 mL  30 mL Oral Daily PRN Jackelyn Poling, NP   30 mL at 06/29/16 1829  . methocarbamol (ROBAXIN) tablet 500 mg  500 mg Oral Q6H PRN Oneta Rack, NP   500 mg at 07/05/16 1204  . mirtazapine (REMERON) tablet 30 mg  30 mg  Oral QHS Beau Fanny, [name]   30 mg at 07/04/16 2109  . nicotine (NICODERM CQ - dosed in mg/24 hours) patch 21 mg  21 mg Transdermal Daily Craige Cotta, MD   21 mg at 07/05/16 0820  . senna-docusate (Senokot-S) tablet 1 tablet  1 tablet Oral q morning - 10a Oneta Rack, NP   1 tablet at 07/05/16 0817  . tamsulosin (FLOMAX) capsule 0.4 mg  0.4 mg Oral QHS Craige Cotta, MD   0.4 mg at 07/04/16 2109    Lab Results:  No results found for this or any previous visit (from the past 48 hour(s)).  Blood Alcohol level:  Lab Results  Component Value Date   ETH <5 06/12/2016    Metabolic Disorder Labs: No results found for: HGBA1C, MPG No results found for: PROLACTIN No results found for: CHOL, TRIG, HDL, CHOLHDL, VLDL, LDLCALC  Physical Findings: AIMS: Facial and Oral Movements Muscles of Facial Expression: None, normal Lips and Perioral Area: None, normal Jaw: None, normal Tongue: None, normal,Extremity Movements Upper (arms, wrists, hands, fingers): None, normal Lower (legs, knees, ankles, toes): None, normal, Trunk Movements Neck, shoulders, hips: None, normal, Overall Severity Severity of abnormal movements (highest score from questions above): None, normal Incapacitation due to abnormal movements: None, normal Patient's awareness of abnormal movements (rate only patient's report): No Awareness, Dental Status Current problems with teeth and/or dentures?: No Does patient usually wear dentures?: No  CIWA:  CIWA-Ar Total: 1 COWS:  COWS Total Score: 1  Musculoskeletal: Strength & Muscle Tone: within normal limits Gait & Station: normal Patient leans: N/A  Psychiatric Specialty Exam: Physical Exam  Nursing note and vitals reviewed. Constitutional: He is oriented to person, place, and time. He appears well-developed and well-nourished.  Cardiovascular: Regular rhythm.   Musculoskeletal: Normal range of motion.  Neurological: He is alert and oriented to person, place,  and time.  Skin: Skin is warm and dry.  Psychiatric: He has a normal mood and affect. His speech is normal and behavior is normal. Cognition and memory are normal.    Review of Systems  Psychiatric/Behavioral: Positive for depression. Negative for hallucinations, substance abuse and suicidal ideas. The patient is nervous/anxious and has insomnia.   All other systems reviewed and are negative.  dizziness,  no chest pain or shortness of breath , no vomiting   Blood pressure 96/76, pulse (!) 107, temperature 97.9 F (36.6 C), temperature source Oral, resp. rate 18, height 6\' 3"  (1.905 m), weight 88.9 kg (196 lb).Body mass index is 24.5  kg/m.  General Appearance: casual, fairly groomed  Eye Contact:  Good  Speech:  Normal Rate  Volume:  Normal  Mood:  Remains depressed but improving  Affect:  Remains constricted   Thought Process:  Linear  Orientation:  Other:  alert and attentive   Thought Content:  Denies hallucinations, no delusions expressed, not internally preoccupied, anxious ruminations   Suicidal Thoughts:  No  Homicidal Thoughts:  No   Memory:  recent and remote grossly intact   Judgement:  improving   Insight:  Improving   Psychomotor Activity:restlessness   Concentration:  Concentration: Good and Attention Span: Good  Recall:  Good  Fund of Knowledge:  Good  Language:  Good  Akathisia:  Negative  Handed:  Right  AIMS (if indicated):     Assets:  Desire for Improvement Resilience  ADL's:  Intact  Cognition:  WNL  Sleep:  Number of Hours: 6.5     I agree with current treatment plan on 07/04/2016, Patient seen face-to-face for psychiatric evaluation follow-up, chart reviewed. Reviewed the information documented and agree with the treatment plan.  Treatment Plan Summary: Severe episode of recurrent major depressive disorder, without psychotic features (HCC) unstable, managed as below: Medications:  Start Lico3 450mg  at night --medication education provided -Continue  Robaxin 500 mg PO Q6 PRN for spasm - Wellbutrin XL to 150mg  mg daily for depression/motivation , decrease Wellbutrin to 75 mg over the weekend -Continue   Klonopin 0.5 mg BID for acute anxiety -Continue Neurontin 600  mg TID for chronic anxiety, pain.  -Discontinue Zoloft -Continue Vistaril 25 mg Q 6 hours PRN for anxiety as needed  -Continue Remeron  To 30mg  po qhs for insomnia/MDD -Continue  Vitamin D supplementation to 1000 iu daily .  -Encourage group , milieu participation to work on Pharmacologistcoping skills and symptom reduction  Treatment team working on disposition planning options   Wynelle BourgeoisUreh N Lekauwa, MD [facility] 07/05/2016, 3:32 PM   I agree with findings and treatment plan of this patient

## 2016-07-05 NOTE — [facility] Group Notes (Signed)
Adult Psychoeducational Group Note  Date:  07/05/2016 Time:  11:02 AM  Group Topic/Focus:  Wellness Toolbox:   The focus of this group is to discuss various aspects of wellness, balancing those aspects and exploring ways to increase the ability to experience wellness.  Patients will create a wellness toolbox for use upon discharge.   Participation Level:  Did Not Attend  Patient did not attend wellness group this morning. Christopher Giles 07/05/2016, 11:02 AM

## 2016-07-05 NOTE — Progress Notes (Signed)
Recreation Therapy Notes  Date: 07/05/16 Time: 0930 Location: [address] Dayroom  Group Topic: Stress Management  Goal Area(s) Addresses:  Patient will verbalize importance of using healthy stress management.  Patient will identify positive emotions associated with healthy stress management.   Intervention: Stress Management  Activity :  Floating on a Cloud.  LRT introduced the stress management technique of guided imagery.  LRT read a script which allowed patients to participate in the activity.  Patients were to follow along as LRT read script.  Education:  Stress Management, Discharge Planning.   Education Outcome: Acknowledges edcuation/In group clarification offered/Needs additional education  Clinical Observations/Feedback: Pt did not attend group.    Caroll RancherMarjette Ovie Cornelio, LRT/CTRS         Caroll RancherLindsay, Talissa Apple A 07/05/2016 12:03 PM

## 2016-07-06 MED ORDER — BUPROPION HCL ER (XL) 150 MG PO TB24: 150.0000 mg | ORAL_TABLET | Freq: Every day | ORAL | 0 refills | Status: DC

## 2016-07-06 MED ORDER — GABAPENTIN 300 MG PO CAPS: 600.0000 mg | ORAL_CAPSULE | Freq: Three times a day (TID) | ORAL | 0 refills | Status: DC

## 2016-07-06 MED ORDER — LITHIUM CARBONATE 150 MG PO CAPS: 450.0000 mg | ORAL_CAPSULE | Freq: Every day | ORAL | 0 refills | Status: DC

## 2016-07-06 MED ORDER — LITHIUM CARBONATE 150 MG PO CAPS
450.0000 mg | ORAL_CAPSULE | Freq: Every day | ORAL | Status: DC
  Filled 2016-07-06: qty 21

## 2016-07-06 MED ORDER — LEVOTHYROXINE SODIUM 75 MCG PO TABS: 75.0000 ug | ORAL_TABLET | Freq: Every day | ORAL | 0 refills | Status: DC

## 2016-07-06 MED ORDER — HYDROXYZINE HCL 25 MG PO TABS: 25.0000 mg | ORAL_TABLET | Freq: Four times a day (QID) | ORAL | 0 refills | Status: DC | PRN

## 2016-07-06 MED ORDER — TAMSULOSIN HCL 0.4 MG PO CAPS: 0.4000 mg | ORAL_CAPSULE | Freq: Every day | ORAL | 0 refills | Status: DC

## 2016-07-06 MED ORDER — VITAMIN D3 25 MCG (1000 UNIT) PO TABS: 1000.0000 [IU] | ORAL_TABLET | Freq: Every day | ORAL | 0 refills | Status: DC

## 2016-07-06 MED ORDER — MIRTAZAPINE 30 MG PO TABS: 30.0000 mg | ORAL_TABLET | Freq: Every day | ORAL | 0 refills | Status: DC

## 2016-07-06 MED ORDER — CLONAZEPAM 0.5 MG PO TABS: 0.5000 mg | ORAL_TABLET | Freq: Two times a day (BID) | ORAL | 0 refills | Status: DC | PRN

## 2016-07-06 NOTE — [facility] Suicide Risk Assessment (Signed)
[facility] Discharge Suicide Risk Assessment   Principal Problem: Severe episode of recurrent major depressive disorder, without psychotic features [facility]) Discharge Diagnoses:  Patient Active Problem List   Diagnosis Date Noted  . Moderate episode of recurrent major depressive disorder ([place]) [F33.1]   . Severe episode of recurrent major depressive disorder, without psychotic features ([place]) [F33.2]    The treatment team discusssed patient today and then met with him. Patient's apartment is ready for him and sister is very supportive. Patient reports feeling a whole lot better today with no suicidal ideations and ready for discharge.He feels he is at a good place and thankful for the help he received and says his sister will pick him up upon discharge. He asked questions about his medications and discharge/transition plan which were answeredMet with patient today an Total Time spent with patient: 45 minutes  Musculoskeletal: Strength & Muscle Tone: within normal limits Gait & Station: normal Patient leans: N/A  Psychiatric Specialty Exam: ROS  Blood pressure 116/88, pulse 91, temperature 97.6 F (36.4 C), temperature source Oral, resp. rate 16, height '6\' 3"'  (1.905 m), weight 88.9 kg (196 lb).Body mass index is 24.5 kg/m.  General Appearance: Casual  Eye Contact::  Good  Speech:  Clear and Coherent and Normal Rate409  Volume:  Normal  Mood:  Euthymic  Affect:  Congruent  Thought Process:  Coherent and Goal Directed  Orientation:  Full (Time, Place, and Person)  Thought Content:  Logical  Suicidal Thoughts:  No  Homicidal Thoughts:  No  Memory:  Immediate;   Good Recent;   Good Remote;   Good  Judgement:  Fair  Insight:  Fair  Psychomotor Activity:  Normal  Concentration:  Fair  Recall:  Good  Fund of Knowledge:Good  Language: Good  Akathisia:  No  Handed:  Right  AIMS (if indicated):     Assets:  Communication Skills Desire for Improvement Housing Physical Health Social  Support Transportation  Sleep:  Number of Hours: 6  Cognition: WNL  ADL's:  Intact   Mental Status Per Nursing Assessment::   On Admission:     Demographic Factors:  Male, Caucasian and Unemployed  Loss Factors: None  Historical Factors: Prior suicide attempts and Family history of mental illness or substance abuse  Risk Reduction Factors:   Sense of responsibility to family and Positive social support  Continued Clinical Symptoms:  None Cognitive Features That Contribute To Risk:  None    Suicide Risk:  Minimal: No identifiable suicidal ideation.  Patients presenting with no risk factors but with morbid ruminations; may be classified as minimal risk based on the severity of the depressive symptoms  Follow-up Information    [facility] Follow up on 07/07/2016.   Why:  at 1:00pm for therapy with Alimah. Please arrive 20 minutes early Contact information: [facility] [address]  [place], [place], [place] [address] PHONE: [phone] FAX: [place] .   Specialty:  Professional Counselor Why:  Please use their walk-in clinic within 1-3 days to be established for medication management and therapy. Walk-in hours are Monday-Friday from 8am-12pm. It is suggested that you arrive as early as possible.  Contact information: [address] [place] [address] [phone]           Plan Of Care/Follow-up recommendations:  Activity:  will continue current medications and keep outpatient appointment tomorrow  Chinita Pester  Sigurd Sos, MD 07/06/2016, 10:37 AM

## 2016-07-06 NOTE — Progress Notes (Signed)
Pt d/c with a family member. All items returned. D/C instructions given, samples given and prescriptions given.

## 2016-07-06 NOTE — Progress Notes (Signed)
Patient discharged to lobby. Patient was stable and appreciative at that time. All papers, samples and prescriptions were given and valuables returned. Verbal understanding expressed. Denies SI/HI and A/VH. Patient given opportunity to express concerns and ask questions.  

## 2016-07-06 NOTE — Tx Team (Signed)
Interdisciplinary Treatment and Diagnostic Plan Update  07/06/2016 Time of Session: 9:30am Christopher Giles MRN: [id number]  Principal Diagnosis: Severe episode of recurrent major depressive disorder, without psychotic features (HCC)  Secondary Diagnoses: Principal Problem:   Severe episode of recurrent major depressive disorder, without psychotic features (HCC) Active Problems:   Moderate episode of recurrent major depressive disorder (HCC)   Current Medications:  Current Facility-Administered Medications  Medication Dose Route Frequency Provider Last Rate Last Dose  . acetaminophen (TYLENOL) tablet 650 mg  650 mg Oral Q6H PRN Jackelyn Poling, NP   650 mg at 07/05/16 1204  . alum & mag hydroxide-simeth (MAALOX/MYLANTA) 200-200-20 MG/5ML suspension 30 mL  30 mL Oral Q4H PRN Jackelyn Poling, NP   30 mL at 06/25/16 1813  . buPROPion (WELLBUTRIN XL) 24 hr tablet 150 mg  150 mg Oral Daily Beau Fanny, [name]   150 mg at 07/06/16 0837  . cholecalciferol (VITAMIN D) tablet 1,000 Units  1,000 Units Oral Daily Craige Cotta, MD   1,000 Units at 07/06/16 [phone]  . clonazePAM (KLONOPIN) tablet 0.5 mg  0.5 mg Oral BID Craige Cotta, MD   0.5 mg at 07/06/16 0837  . gabapentin (NEURONTIN) capsule 600 mg  600 mg Oral TID Craige Cotta, MD   600 mg at 07/06/16 0837  . hydrOXYzine (ATARAX/VISTARIL) tablet 25 mg  25 mg Oral Q6H PRN Jackelyn Poling, NP   25 mg at 06/30/16 1310  . levothyroxine (SYNTHROID, LEVOTHROID) tablet 75 mcg  75 mcg Oral QAC breakfast Craige Cotta, MD   75 mcg at 07/06/16 [phone]  . lithium carbonate capsule 450 mg  450 mg Oral QHS Molinda Bailiff, MD   450 mg at 07/05/16 2119  . magnesium citrate solution 1 Bottle  1 Bottle Oral Once PRN Rockey Situ Cobos, MD      . magnesium hydroxide (MILK OF MAGNESIA) suspension 30 mL  30 mL Oral Daily PRN Jackelyn Poling, NP   30 mL at 06/29/16 1829  . methocarbamol (ROBAXIN) tablet 500 mg  500 mg Oral Q6H PRN Oneta Rack, NP   500 mg at  07/06/16 0620  . mirtazapine (REMERON) tablet 30 mg  30 mg Oral QHS Beau Fanny, [name]   30 mg at 07/05/16 2119  . nicotine (NICODERM CQ - dosed in mg/24 hours) patch 21 mg  21 mg Transdermal Daily Craige Cotta, MD   21 mg at 07/06/16 0839  . senna-docusate (Senokot-S) tablet 1 tablet  1 tablet Oral q morning - 10a Oneta Rack, NP   1 tablet at 07/06/16 0837  . tamsulosin (FLOMAX) capsule 0.4 mg  0.4 mg Oral QHS Craige Cotta, MD   0.4 mg at 07/05/16 2119   PTA Medications: Prescriptions Prior to Admission  Medication Sig Dispense Refill Last Dose  . bismuth subsalicylate (PEPTO BISMOL) 262 MG chewable tablet Chew 524 mg by mouth as needed for indigestion or diarrhea or loose stools.   unk  . clonazePAM (KLONOPIN) 1 MG tablet Take 1 mg by mouth 3 (three) times daily as needed for anxiety.   06/12/2016 at Unknown time  . DULoxetine (CYMBALTA) 60 MG capsule Take 60 mg by mouth daily.   06/12/2016 at Unknown time  . ibuprofen (ADVIL,MOTRIN) 200 MG tablet Take 400 mg by mouth every 6 (six) hours as needed for moderate pain.   unk  . levothyroxine (SYNTHROID, LEVOTHROID) 50 MCG tablet Take 1 tablet (50 mcg total) by  mouth daily before breakfast. (Patient taking differently: Take 75 mcg by mouth daily before breakfast. ) 30 tablet 1 06/12/2016 at Unknown time  . Menthol, Topical Analgesic, (BIOFREEZE ROLL-ON COLORLESS) 4 % GEL Apply 1 application topically daily as needed (pain).   unk  . Menthol-Methyl Salicylate (ICY HOT BALM EXTRA STRENGTH) 7.6-29 % OINT Apply 1 application topically daily as needed (pain).   unk  . naproxen sodium (ANAPROX) 220 MG tablet Take 220 mg by mouth 2 (two) times daily as needed (pain).   unk    Patient Stressors: Medication change or noncompliance Occupational concerns  Patient Strengths: Average or above average intelligence Motivation for treatment/growth Physical Health Supportive family/friends  Treatment Modalities: Medication Management, Group  therapy, Case management,  1 to 1 session with clinician, Psychoeducation, Recreational therapy.   Physician Treatment Plan for Primary Diagnosis: Severe episode of recurrent major depressive disorder, without psychotic features (HCC) Long Term Goal(s): Improvement in symptoms so as ready for discharge Improvement in symptoms so as ready for discharge   Short Term Goals: Ability to identify changes in lifestyle to reduce recurrence of condition will improve Ability to verbalize feelings will improve Ability to identify and develop effective coping behaviors will improve Compliance with prescribed medications will improve Ability to identify changes in lifestyle to reduce recurrence of condition will improve Ability to demonstrate self-control will improve Ability to maintain clinical measurements within normal limits will improve Ability to identify triggers associated with substance abuse/mental health issues will improve  Medication Management: Evaluate patient's response, side effects, and tolerance of medication regimen.  Therapeutic Interventions: 1 to 1 sessions, Unit Group sessions and Medication administration.  Evaluation of Outcomes: Adequate for Discharge  Physician Treatment Plan for Secondary Diagnosis: Principal Problem:   Severe episode of recurrent major depressive disorder, without psychotic features (HCC) Active Problems:   Moderate episode of recurrent major depressive disorder (HCC)  Long Term Goal(s): Improvement in symptoms so as ready for discharge Improvement in symptoms so as ready for discharge   Short Term Goals: Ability to identify changes in lifestyle to reduce recurrence of condition will improve Ability to verbalize feelings will improve Ability to identify and develop effective coping behaviors will improve Compliance with prescribed medications will improve Ability to identify changes in lifestyle to reduce recurrence of condition will  improve Ability to demonstrate self-control will improve Ability to maintain clinical measurements within normal limits will improve Ability to identify triggers associated with substance abuse/mental health issues will improve     Medication Management: Evaluate patient's response, side effects, and tolerance of medication regimen.  Therapeutic Interventions: 1 to 1 sessions, Unit Group sessions and Medication administration.  Evaluation of Outcomes: Adequate for Discharge   RN Treatment Plan for Primary Diagnosis: Severe episode of recurrent major depressive disorder, without psychotic features (HCC) Long Term Goal(s): Knowledge of disease and therapeutic regimen to maintain health will improve  Short Term Goals: Ability to disclose and discuss suicidal ideas and Ability to identify and develop effective coping behaviors will improve  Medication Management: RN will administer medications as ordered by provider, will assess and evaluate patient's response and provide education to patient for prescribed medication. RN will report any adverse and/or side effects to prescribing provider.  Therapeutic Interventions: 1 on 1 counseling sessions, Psychoeducation, Medication administration, Evaluate responses to treatment, Monitor vital signs and CBGs as ordered, Perform/monitor CIWA, COWS, AIMS and Fall Risk screenings as ordered, Perform wound care treatments as ordered.  Evaluation of Outcomes: Adequate for Discharge   LCSW  Treatment Plan for Primary Diagnosis: Severe episode of recurrent major depressive disorder, without psychotic features (HCC) Long Term Goal(s): Safe transition to appropriate next level of care at discharge, Engage patient in therapeutic group addressing interpersonal concerns.  Short Term Goals: Engage patient in aftercare planning with referrals and resources, Increase social support and Increase ability to appropriately verbalize feelings  Therapeutic Interventions:  Assess for all discharge needs, 1 to 1 time with Social worker, Explore available resources and support systems, Assess for adequacy in community support network, Educate family and significant other(s) on suicide prevention, Complete Psychosocial Assessment, Interpersonal group therapy.  Evaluation of Outcomes: Adequate for Discharge   Progress in Treatment: Attending groups: Yes. Participating in groups: Progressing Taking medication as prescribed: Yes. Toleration medication: Yes. Family/Significant other contact made: Yes with sister Patient understands diagnosis: Yes. Discussing patient identified problems/goals with staff: Yes. Medical problems stabilized or resolved: Yes. Denies suicidal/homicidal ideation: Yes, patient denies SI at this time Issues/concerns per patient self-inventory: No. Other: na  New problem(s) identified: No, Describe:  none at this time  New Short Term/Long Term Goal(s): None identified at this time.   Discharge Plan or Barriers: limited support network, unstable housing  06/18/16: Pt continues to be unsure if he can return to his sister's house; will follow-up with outpatient rescources  06/28/16: Pt reports that it is likely that he can return to his apartment. Pt has been offered resources such as [facility], [facility], and shelter resources.   10/20: Patient has been provided with information on emergency housing options and plans to follow up with outpatient services.   10/24: Patient reports that he can return to his apartment. Sister has provided him a loan to keep his electricity on at this time. He plans to follow up with outpatient services.    Reason for Continuation of Hospitalization: Depression Medication stabilization Suicidal ideation  Estimated Length of Stay: Discharge anticipated for today 07/06/16  Attendees:  Patient:              Physician: Dr. Seth BakeLekwauwa, MD  07/06/2016   9:30am  Nursing: Gordy ClementJan Wright,  Elizabeth I., Jane Ochieng, RN            07/06/2016 9:30am  RN Care Manager:    Social Workers: Samuella BruinKristin Joanmarie Tsang, LCSW   07/06/2016 9:30am  Nurse Pratictioners: Gray BernhardtMay Augustin, NP, Malachy Chamberakia Starkes, NP 07/06/2016 9:30am  Other:               Scribe for Treatment Team: Samuella BruinKristin Carisma Troupe, LCSW Clinical Social Worker [facility] [phone]

## 2016-07-06 NOTE — Progress Notes (Signed)
Chaplain support with pt around recent death of his mother.  Christopher Giles was yesterday as [facility].   Engaged in grief work and psycho social ed around grief with Christopher Giles, who is motivated to follow up with appointment with counseling services.  He spoke with chaplain about readjusting to life after long [facility].  Is motivated to share his grief around loss of mother with counselor and group setting, acknowledging this is important part of his healing.  Explored grief around inpatient admission.  Christopher Giles stated he had inpatient admission 10 years prior and was hopeful that he would not have to be admitted again.  Is hopeful and future oriented in conversation,.     Christopher Giles, Christopher Giles MDiv

## 2016-07-06 NOTE — [facility] Group Notes (Signed)
Pt attended spiritual care group on grief and loss facilitated by chaplain Burnis KingfisherMatthew Isaac Dubie   Group opened with brief discussion and psycho-social ed around grief and loss in relationships and in relation to self - identifying life patterns, circumstances, changes that cause losses. Established group norm of speaking from own life experience. Group goal of establishing open and affirming space for members to share loss and experience with grief, normalize grief experience and provide psycho social education and grief support.   [name] was present for end of group.  Engaged voluntarily and spoke of his upcoming discharge after long admission.

## 2016-07-06 NOTE — Discharge Summary (Signed)
Physician Discharge Summary Note  Patient:  Christopher Giles is an 50 y.o., male MRN:  [id number] DOB:  01/13/1966 Patient phone:  [phone] (home)  Patient address:   [address] [place] [address],  Total Time spent with patient: 1 hour  Date of Admission:  06/13/2016 Date of Discharge: 07/06/2016  Reason for Admission: History of Present Illness:PER [facility] Assessment Note- Christopher Giles an 50 y.o.single malewho presents unaccompanied to [facility] ED reporting severe symptoms of depression and anxiety. Pt reports he has a long history of major depressive disorder and is currently prescribed Cymbalta 60 mg daily and Klonopin 0.5 mg TID PRN by his primary care physician, Dr. Merri Brunette. Pt says his medication has been effective for years but over the past two months he feels they are not helping. Pt scales both his depression and anxiety as 10/10. He denies current suicidal ideation but says he recently has had suicidal thoughts and if he doesn't receive inpatient treatment he believes he will decompensate and become suicidal. Pt reports a history of at least six previous suicide attempts including ingesting rat poison, cutting his wrist and overdosing. Pt says he has a history of multiple psychiatric hospitalizations and ECT and his last inpatient treatment was at [facility] in 2008. He denies current homicidal ideation or history of violence. He denies any history of psychotic symptoms. 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  Pt identifies his primary stressor as his mental health symptoms. He states he lost his job two months ago and is in danger of losing his residence. He says he has no health insurance which has hindered his ability to access mental health treatment. He does not current have a psychiatrist or therapist. Pt reports he lives alone, has never been married and has no children. He  identifies his sister as his only support.   The following note was written by the Pt describing his symptoms and concerns:    Associated Signs/Symptoms: Depression Symptoms:  depressed mood, feelings of worthlessness/guilt, difficulty concentrating, (Hypo) Manic Symptoms:  Irritable Mood, Anxiety Symptoms:  Excessive Worry, Social Anxiety, Psychotic Symptoms:  Hallucinations: None PTSD Symptoms: Avoidance:  Decreased Interest/Participation Foreshortened Future   Principal Problem: Severe episode of recurrent major depressive disorder, without psychotic features (HCC) Discharge Diagnoses:  Past Medical History:  Past Medical History:  Diagnosis Date  . Anxiety   . Depression   . Substance abuse   . Suicide attempt    x 6 per pt  . Thyroid disease    hypo    Past Surgical History:  Procedure Laterality Date  . punctured lung     Family History:  Family History  Problem Relation Age of Onset  . Hyperlipidemia Mother   . Cancer Mother   . Cancer Father   . Diabetes Maternal Grandmother    Family Psychiatric  History: Patient reports father: has hx depression and mother: anxiety/depression  Social History:  History  Alcohol Use No     History  Drug Use No    Social History   Social History  . Marital status: Single    Spouse name: N/A  . Number of children: N/A  . Years of education: N/A   Social History Main Topics  . Smoking status: Current Every Day Smoker    Packs/day: 1.00    Types: Cigarettes  . Smokeless tobacco: Never Used  . Alcohol use No  .  Drug use: No  . Sexual activity: Not Asked   Other Topics Concern  . None   Social History Narrative  . None    Hospital Course:  Christopher Giles was admitted for Severe episode of recurrent major depressive disorder, without psychotic features (HCC) and crisis management.The pt has been here for several days beyond the normal length of stay. However, given this pt's poor response to various  medications, it has been a challenge to find medications that this pt responds to. He was treated with the following medications Wellbutrin 150mg  po daily for depression, Neurotonin 600mg  po TID for agitation, Remeron 30mg  po qhs for depression and insomnia, Hydroxyzine 25mg  po QID prn for anxiety. He was continued on his home dose of Klonopin, however there was a dose adjustment of 1mg  to 0.5mg  po BID.  Klonopin taper began in the hospital.  Christopher Giles was discharged with current medication and was instructed on how to take medications as prescribed; (details listed below under Medication List).  Due to extended length of stay and patient present with risk factors and stressors, will discharge on 7 day rx of Klonopin, and 10 day rx of Gabapentin and Lithium to prevent large quantity of medication being dispensed at one time to ensure follow up with outpatient. Medical problems were identified and treated as needed.  Home medications were restarted as appropriate. Pertinent labs during this admission were UDS positive for benzodiazepines( rx for Klonopin).   Improvement was monitored by observation and Christopher Giles daily report of symptom reduction.  Emotional and mental status was monitored by daily self-inventory reports completed by Christopher Giles and clinical staff.         Christopher Giles was evaluated by the treatment team for stability and plans for continued recovery upon discharge.  Christopher Giles motivation was an integral factor for scheduling further treatment.  Employment, transportation, bed availability, health status, family support, and any pending legal issues were also considered during his hospital stay. He was offered further treatment options upon discharge including but not limited to Residential, Intensive Outpatient, and Outpatient treatment.  Christopher Giles will follow up with the services as listed below under Follow Up Information.     Upon completion of this admission the  Christopher Giles was both mentally and medically stable for discharge denying suicidal/homicidal ideation, auditory/visual/tactile hallucinations, delusional thoughts and paranoia.      Physical Findings: AIMS: Facial and Oral Movements Muscles of Facial Expression: None, normal Lips and Perioral Area: None, normal Jaw: None, normal Tongue: None, normal,Extremity Movements Upper (arms, wrists, hands, fingers): None, normal Lower (legs, knees, ankles, toes): None, normal, Trunk Movements Neck, shoulders, hips: None, normal, Overall Severity Severity of abnormal movements (highest score from questions above): None, normal Incapacitation due to abnormal movements: None, normal Patient's awareness of abnormal movements (rate only patient's report): No Awareness, Dental Status Current problems with teeth and/or dentures?: No Does patient usually wear dentures?: No  CIWA:  CIWA-Ar Total: 1 COWS:  COWS Total Score: 1  Psychiatric Specialty Exam: See MD SRA Physical Exam  ROS  Blood pressure 116/88, pulse 91, temperature 97.6 F (36.4 C), temperature source Oral, resp. rate 16, height 6\' 3"  (1.905 m), weight 88.9 kg (196 lb).Body mass index is 24.5 kg/m.  Sleep:  Number of Hours: 6     Have you used any form of tobacco in the last 30 days? (Cigarettes, Smokeless Tobacco, Cigars, and/or Pipes): Yes  Has this patient used  any form of tobacco in the last 30 days? (Cigarettes, Smokeless Tobacco, Cigars, and/or Pipes) Yes, Yes, A prescription for an FDA-approved tobacco cessation medication was offered at discharge and the patient refused  Blood Alcohol level:  Lab Results  Component Value Date   ETH <5 06/12/2016    Metabolic Disorder Labs:  No results found for: HGBA1C, MPG No results found for: PROLACTIN No results found for: CHOL, TRIG, HDL, CHOLHDL, VLDL, LDLCALC  See Psychiatric Specialty Exam and Suicide Risk Assessment completed by Attending Physician prior to  discharge.  Discharge destination:  Home  Is patient on multiple antipsychotic therapies at discharge:  No   Has Patient had three or more failed trials of antipsychotic monotherapy by history:  No  Recommended Plan for Multiple Antipsychotic Therapies: NA  Discharge Instructions    Discharge instructions    Complete by:  As directed    Discharge Recommendations:  The patient is being discharged to his family. Patient is to take his discharge medications as ordered. See follow up below. We recommend that he participate in individual therapy to target depressive symptoms and improving coping skills. We recommend that he participate in family therapy to target the conflict with his family , and improving communication skills and conflict resolution skills. Family is to initiate/implement a contingency based behavioral model to address patient's behavior. The patient should abstain from all illicit substances, alcohol, and peer pressure. If the patient's symptoms worsen or do not continue to improve or if the patient becomes actively suicidal or homicidal then it is recommended that the patient return to the closest hospital emergency room or call 911 for further evaluation and treatment. National Suicide Prevention Lifeline 1800-SUICIDE or 5800642793. Please follow up with your primary medical doctor for all other medical needs.   The patient has been educated on the possible side effects to medications and he/his guardian is to contact a medical professional and inform outpatient provider of any new side effects of medication. He is to take regular diet and activity as tolerated.  Family was educated about removing/locking any firearms, medications or dangerous products from the home.       Medication List    STOP taking these medications   DULoxetine 60 MG capsule Commonly known as:  CYMBALTA   ICY HOT BALM EXTRA STRENGTH 7.6-29 % Oint   naproxen sodium 220 MG  tablet Commonly known as:  ANAPROX     TAKE these medications     Indication  BIOFREEZE ROLL-ON COLORLESS 4 % Gel Generic drug:  Menthol (Topical Analgesic) Apply 1 application topically daily as needed (pain).  Indication:  Pain   bismuth subsalicylate 262 MG chewable tablet Commonly known as:  PEPTO BISMOL Chew 524 mg by mouth as needed for indigestion or diarrhea or loose stools.  Indication:  Stomach Inflammation, Heartburn   buPROPion 150 MG 24 hr tablet Commonly known as:  WELLBUTRIN XL Take 1 tablet (150 mg total) by mouth daily. Start taking on:  07/07/2016  Indication:  Major Depressive Disorder   cholecalciferol 1000 units tablet Commonly known as:  VITAMIN D Take 1 tablet (1,000 Units total) by mouth daily. Start taking on:  07/07/2016  Indication:  Vitamin D Deficiency   clonazePAM 0.5 MG tablet Commonly known as:  KLONOPIN Take 1 tablet (0.5 mg total) by mouth 2 (two) times daily as needed for anxiety. What changed:  medication strength  how much to take  when to take this  Indication:  Panic Disorder   gabapentin  300 MG capsule Commonly known as:  NEURONTIN Take 2 capsules (600 mg total) by mouth 3 (three) times daily.  Indication:  Agitation, Neuropathic Pain   hydrOXYzine 25 MG tablet Commonly known as:  ATARAX/VISTARIL Take 1 tablet (25 mg total) by mouth every 6 (six) hours as needed for anxiety.  Indication:  Anxiety Neurosis   levothyroxine 75 MCG tablet Commonly known as:  SYNTHROID, LEVOTHROID Take 1 tablet (75 mcg total) by mouth daily before breakfast. Start taking on:  07/07/2016 What changed:  medication strength  how much to take  Indication:  Underactive Thyroid, Cancer of Thyroid   lithium carbonate 150 MG capsule Take 3 capsules (450 mg total) by mouth at bedtime.  Indication:  Depression   mirtazapine 30 MG tablet Commonly known as:  REMERON Take 1 tablet (30 mg total) by mouth at bedtime.  Indication:  Major  Depressive Disorder   tamsulosin 0.4 MG Caps capsule Commonly known as:  FLOMAX Take 1 capsule (0.4 mg total) by mouth at bedtime.  Indication:  Benign Enlargement of Prostate     ASK your doctor about these medications     Indication  ibuprofen 200 MG tablet Commonly known as:  ADVIL,MOTRIN Take 400 mg by mouth every 6 (six) hours as needed for moderate pain.       Follow-up Information    [facility] Follow up on 07/07/2016.   Why:  at 1:00pm for therapy with Alimah. Please arrive 20 minutes early. Call if you need to reschedule.  Contact information: [facility] [address]  [address]  [place], [place] [address] PHONE: [phone] FAX: [phone]           [facility] .   Specialty:  Behavioral Health Why:  Please go to walk-in clinic within 2 days of discharge to get established with medication management services. Go Monday-Friday at 8am. Initial assessment may take several hours, you will be scheduled appointments after assessment is complete. [address] [place] [address] [phone]           Follow-up recommendations:  Activity:  Increase activity as tolerated Diet:  Regular house diet Tests:  Will need TSH in 2weeks.  Other:  Klonopin taper initiated while in hospital, please continue outside the hospital. Reported on admission this medication was no longer effective.   Comments:  Please keep scheduled appointment.   Signed: Truman Hayward, [name] 07/06/2016, 11:37 AM

## 2016-07-06 NOTE — Progress Notes (Signed)
  [facility]  Will you be returning to the same living situation after discharge:  No. Patient plans to return to his apartment At discharge, do you have transportation home?: Yes,  sister will pick up Do you have the ability to pay for your medications: Yes,  patient will be provided with prescriptions at discharge  Release of information consent forms completed and in the chart;  Patient's signature needed at discharge.  Patient to Follow up at: Follow-up Information    [facility] Follow up on 07/07/2016.   Why:  at 1:00pm for therapy with Alimah. Please arrive 20 minutes early. Call if you need to reschedule.  Contact information: [facility] [address]  [address]  [place], [place] [address] PHONE: [phone] FAX: [phone]           [facility] .   Specialty:  Behavioral Health Why:  Please go to walk-in clinic within 2 days of discharge to get established with medication management services. Go Monday-Friday at 8am. Initial assessment may take several hours, you will be scheduled appointments after assessment is complete. Contact information: [address] [place] [address] [phone]           Next level of care provider [facility] Link:no  Safety Planning and Suicide Prevention discussed: Yes,  with patient and sister  Have you used any form of tobacco in the last 30 days? (Cigarettes, Smokeless Tobacco, Cigars, and/or Pipes): Yes  Has patient been referred to the Quitline?: Patient refused referral  Patient has been referred for addiction treatment: Yes  Samaj Wessells L Lorris Carducci 07/06/2016, 11:21 AM

## 2016-07-06 NOTE — Progress Notes (Signed)
Recreation Therapy Notes   Animal-Assisted Activity (AAA) Program Checklist/Progress Notes Patient Eligibility Criteria Checklist & Daily Group note for Rec TxIntervention  Date: 10.24.2017 Time: 2:45pm Location: 4 00 Hall Dayroom   AAA/T Program Assumption of Risk Form signed by Patient/ or Parent Legal Guardian Yes  Patient is free of allergies or sever asthma Yes  Patient reports no fear of animals Yes  Patient reports no history of cruelty to animals Yes  Patient understands his/her participation is voluntary Yes  Patient washes hands before animal contact Yes  Patient washes hands after animal contact Yes  Behavioral Response: Engaged, Attentive  Education:Hand Washing, Appropriate Animal Interaction   Education Outcome: Acknowledges education.   Clinical Observations/Feedback: Patient attended session and interacted appropriately with therapy dog and peers. Patient asked appropriate questions about therapy dog and his training. Patient shared stories about their pets at home with group.   Christopher Giles L Christopher Giles, LRT/CTRS        Christopher Giles L 07/06/2016 3:02 PM 

## 2016-07-06 NOTE — Progress Notes (Signed)
Christopher Giles. Jake had been up and visible in milieu, still endorsing anxiety and depression, spoke about how he started a new medication this evening in hopes it will help with his on-going depression. Traylen received all medications without incident. A. Support provided, medication education given. Dola Argyle. Akira verbalized understanding, safety maintained.

## 2016-07-09 ENCOUNTER — Encounter (HOSPITAL_COMMUNITY): Payer: Self-pay

## 2016-07-09 ENCOUNTER — Emergency Department (HOSPITAL_COMMUNITY)
Admission: EM | Admit: 2016-07-09 | Discharge: 2016-07-10 | Disposition: A | Discharge status: Home / Self Care | Payer: Self-pay | Attending: Emergency Medicine | Admitting: Emergency Medicine

## 2016-07-09 DIAGNOSIS — Z79899 Other long term (current) drug therapy: Secondary | ICD-10-CM | POA: Insufficient documentation

## 2016-07-09 DIAGNOSIS — T1491XA Suicide attempt, initial encounter: Secondary | ICD-10-CM

## 2016-07-09 DIAGNOSIS — Y939 Activity, unspecified: Secondary | ICD-10-CM | POA: Insufficient documentation

## 2016-07-09 DIAGNOSIS — T391X2A Poisoning by 4-Aminophenol derivatives, intentional self-harm, initial encounter: Secondary | ICD-10-CM | POA: Insufficient documentation

## 2016-07-09 DIAGNOSIS — X838XXA Intentional self-harm by other specified means, initial encounter: Secondary | ICD-10-CM | POA: Insufficient documentation

## 2016-07-09 DIAGNOSIS — Y999 Unspecified external cause status: Secondary | ICD-10-CM | POA: Insufficient documentation

## 2016-07-09 DIAGNOSIS — E039 Hypothyroidism, unspecified: Secondary | ICD-10-CM | POA: Insufficient documentation

## 2016-07-09 DIAGNOSIS — F17210 Nicotine dependence, cigarettes, uncomplicated: Secondary | ICD-10-CM | POA: Insufficient documentation

## 2016-07-09 DIAGNOSIS — Z5181 Encounter for therapeutic drug level monitoring: Secondary | ICD-10-CM | POA: Insufficient documentation

## 2016-07-09 DIAGNOSIS — Y929 Unspecified place or not applicable: Secondary | ICD-10-CM | POA: Insufficient documentation

## 2016-07-09 LAB — CBC WITH DIFFERENTIAL/PLATELET
Basophils Absolute: 0 10*3/uL (ref 0.0–0.1)
Basophils Relative: 0 %
Eosinophils Absolute: 0 10*3/uL (ref 0.0–0.7)
Eosinophils Relative: 0 %
HEMATOCRIT: 42.8 % (ref 39.0–52.0)
HEMOGLOBIN: 14.8 g/dL (ref 13.0–17.0)
LYMPHS ABS: 1.6 10*3/uL (ref 0.7–4.0)
Lymphocytes Relative: 12 %
MCH: 31.3 pg (ref 26.0–34.0)
MCHC: 34.6 g/dL (ref 30.0–36.0)
MCV: 90.5 fL (ref 78.0–100.0)
MONO ABS: 1.2 10*3/uL — AB (ref 0.1–1.0)
MONOS PCT: 9 %
NEUTROS ABS: 10.3 10*3/uL — AB (ref 1.7–7.7)
NEUTROS PCT: 79 %
Platelets: 276 10*3/uL (ref 150–400)
RBC: 4.73 MIL/uL (ref 4.22–5.81)
RDW: 12.8 % (ref 11.5–15.5)
WBC: 13.1 10*3/uL — ABNORMAL HIGH (ref 4.0–10.5)

## 2016-07-09 LAB — COMPREHENSIVE METABOLIC PANEL
ALBUMIN: 4.2 g/dL (ref 3.5–5.0)
ALK PHOS: 67 U/L (ref 38–126)
ALT: 24 U/L (ref 17–63)
ANION GAP: 10 (ref 5–15)
AST: 21 U/L (ref 15–41)
BUN: 9 mg/dL (ref 6–20)
CALCIUM: 9.3 mg/dL (ref 8.9–10.3)
CHLORIDE: 102 mmol/L (ref 101–111)
CO2: 22 mmol/L (ref 22–32)
Creatinine, Ser: 1.05 mg/dL (ref 0.61–1.24)
GFR calc non Af Amer: >60 mL/min (ref >60)
GLUCOSE: 102 mg/dL — AB (ref 65–99)
Potassium: 3.5 mmol/L (ref 3.5–5.1)
SODIUM: 134 mmol/L — AB (ref 135–145)
Total Bilirubin: 0.8 mg/dL (ref 0.3–1.2)
Total Protein: 6.8 g/dL (ref 6.5–8.1)

## 2016-07-09 LAB — PROTIME-INR
INR: 1.11
PROTHROMBIN TIME: 14.3 s (ref 11.4–15.2)

## 2016-07-09 LAB — URINALYSIS, ROUTINE W REFLEX MICROSCOPIC
BILIRUBIN URINE: NEGATIVE
Glucose, UA: NEGATIVE mg/dL
HGB URINE DIPSTICK: NEGATIVE
KETONES UR: NEGATIVE mg/dL
Leukocytes, UA: NEGATIVE
NITRITE: NEGATIVE
Protein, ur: NEGATIVE mg/dL
SPECIFIC GRAVITY, URINE: 1.018 (ref 1.005–1.030)
pH: 6.5 (ref 5.0–8.0)

## 2016-07-09 LAB — RAPID URINE DRUG SCREEN, HOSP PERFORMED
Amphetamines: NOT DETECTED
BARBITURATES: NOT DETECTED
Benzodiazepines: NOT DETECTED
Cocaine: NOT DETECTED
Opiates: NOT DETECTED
TETRAHYDROCANNABINOL: NOT DETECTED

## 2016-07-09 LAB — ETHANOL

## 2016-07-09 LAB — ACETAMINOPHEN LEVEL

## 2016-07-09 LAB — AMYLASE: AMYLASE: 30 U/L (ref 28–100)

## 2016-07-09 LAB — SALICYLATE LEVEL

## 2016-07-09 LAB — LITHIUM LEVEL: Lithium Lvl: <0.06 mmol/L — ABNORMAL LOW (ref 0.60–1.20)

## 2016-07-09 MED ORDER — LITHIUM CARBONATE 300 MG PO CAPS
450.0000 mg | ORAL_CAPSULE | Freq: Every day | ORAL | Status: DC
  Administered 2016-07-09: 450 mg via ORAL
  Filled 2016-07-09: qty 1

## 2016-07-09 MED ORDER — CLONAZEPAM 0.5 MG PO TABS: 0.5000 mg | ORAL_TABLET | Freq: Two times a day (BID) | ORAL | Status: DC | PRN

## 2016-07-09 MED ORDER — HYDROXYZINE HCL 25 MG PO TABS: 25.0000 mg | ORAL_TABLET | Freq: Four times a day (QID) | ORAL | Status: DC | PRN

## 2016-07-09 MED ORDER — GABAPENTIN 300 MG PO CAPS
600.0000 mg | ORAL_CAPSULE | Freq: Three times a day (TID) | ORAL | Status: DC
  Administered 2016-07-09 – 2016-07-10 (×3): 600 mg via ORAL
  Filled 2016-07-09 (×3): qty 2

## 2016-07-09 MED ORDER — ACETYLCYSTEINE LOAD VIA INFUSION: 150.0000 mg/kg | Freq: Once | INTRAVENOUS | Status: DC

## 2016-07-09 MED ORDER — BUPROPION HCL ER (XL) 150 MG PO TB24
150.0000 mg | ORAL_TABLET | Freq: Every day | ORAL | Status: DC
  Administered 2016-07-09 – 2016-07-10 (×2): 150 mg via ORAL
  Filled 2016-07-09 (×2): qty 1

## 2016-07-09 MED ORDER — DEXTROSE 5 % IV SOLN: 15.0000 mg/kg/h | INTRAVENOUS | Status: DC

## 2016-07-09 MED ORDER — MIRTAZAPINE 15 MG PO TABS
30.0000 mg | ORAL_TABLET | Freq: Every day | ORAL | Status: DC
  Administered 2016-07-09: 30 mg via ORAL
  Filled 2016-07-09: qty 2

## 2016-07-09 MED ORDER — LEVOTHYROXINE SODIUM 75 MCG PO TABS
75.0000 ug | ORAL_TABLET | Freq: Every day | ORAL | Status: DC
  Administered 2016-07-10: 75 ug via ORAL
  Filled 2016-07-09 (×2): qty 1

## 2016-07-09 MED ORDER — ONDANSETRON HCL 4 MG/2ML IJ SOLN
4.0000 mg | Freq: Once | INTRAMUSCULAR | Status: AC
  Administered 2016-07-09: 4 mg via INTRAVENOUS
  Filled 2016-07-09: qty 2

## 2016-07-09 NOTE — BH Assessment (Signed)
Tele Assessment Note  PT'S LABS NOT COMPLETED AT TIME OF ASSESSMENT -  Pt presents voluntarily to [facility] after calling EMS today. Pt is cooperative and oriented x 4. He is wearing a hospital gown. He reports a depressed and anxious mood and his affect is mood congruent. Per chart review, pt was inpatient at [facility] from 06/13/16 to 07/06/16 for MDD. Today, pt reports SI. He says yesterday he ingested 100 extra strength tylenol tablets and samples of Remeron. Pt reports it was an intentional suicide attempt. He reports 7 suicide attempts in all over the years. Pt reports he didn't call EMS yesterday after the overdose because "I've never taken anything like that - I thought I was going to go to sleep." Pt reports he had an initial therapy appointment on 07/08/16 at [facility] but he missed it. He says that he woke up very confused and "was afraid to drive". Pt reports prior inpatient admissions including in 2008 at [facility]. Current stressors include his losing his job two mos ago and he's in danger of losing his apartment. He endorses hopelessness, tearfulness, isolating, fatigue, guilt, loss of interest in usual pleasures,  And worthlessness. Pt reports he can't keep himself safe if d/c. He says, "I just need help." Pt says the "dreads going back to my apartment."   Pt denies any current or past substance abuse problems. Pt does not appear to be intoxicated or in withdrawal at this time. 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 Pt denies homicidal thoughts or physical aggression. Pt denies having access to firearms. Pt denies having any legal problems at this time. Pt is calm and cooperative during assessment. Pt denies hallucinations. Pt does not appear to be responding to internal stimuli and exhibits no delusional thought. Pt's reality testing appears to be intact. He reports he did have an episode of the floor appearing "wavy" when he  presented to hospital today. Pt reports his sister is his only source of support.    Christopher Giles is an 50 y.o. male.   Diagnosis: Major Depressive Disorder, Recurrent, Severe Generalized Anxiety Disorder  Past Medical History:  Past Medical History:  Diagnosis Date  . Anxiety   . Depression   . Substance abuse   . Suicide attempt    x 6 per pt  . Thyroid disease    hypo    Past Surgical History:  Procedure Laterality Date  . punctured lung      Family History:  Family History  Problem Relation Age of Onset  . Hyperlipidemia Mother   . Cancer Mother   . Cancer Father   . Diabetes Maternal Grandmother     Social History:  reports that he has been smoking Cigarettes.  He has been smoking about 1.00 pack per day. He has never used smokeless tobacco. He reports that he does not drink alcohol or use drugs.  Additional Social History:  Alcohol / Drug Use Pain Medications: pt deneis abuse - see pta meds list Prescriptions: pt denies abuse - see pta meds list Over the Counter: pt denies abuse - see pta meds list History of alcohol / drug use?: Yes Longest period of sobriety (when/how long): been clean and sober for 11 yrs now  CIWA: CIWA-Ar BP: 119/94 Pulse Rate: 74 COWS:    PATIENT STRENGTHS: (choose at least two) Average or above average intelligence Capable of independent living Communication skills Supportive family/friends  Allergies: No Known Allergies  Home Medications:  (Not in a hospital admission)  OB/GYN Status:  No LMP for male patient.  General Assessment Data [facility] Assessment: In system Is this a Tele or Face-to-Face Assessment?: Tele Assessment Is this an Initial Assessment or a Re-assessment for this encounter?: Initial Assessment Marital status: Single Maiden name: none Is patient pregnant?: No Pregnancy Status: No Living Arrangements: Alone Can pt return to current living arrangement?: Yes Admission  Status: Voluntary Is patient capable of signing voluntary admission?: Yes Referral Source: Self/Family/Friend Insurance type: self pay     Crisis Care Plan Living Arrangements: Alone Name of Psychiatrist: none Name of Therapist: none  Education Status Is patient currently in school?: No Highest grade of school patient has completed: 8114 Name of school: [employer]  Risk to self with the past 6 months Suicidal Ideation: Yes-Currently Present Has patient been a risk to self within the past 6 months prior to admission? : Yes Suicidal Intent: No Has patient had any suicidal intent within the past 6 months prior to admission? : Yes Is patient at risk for suicide?: Yes Suicidal Plan?: No Has patient had any suicidal plan within the past 6 months prior to admission? : Yes Access to Means: Yes Specify Access to Suicidal Means: access to meds What has been your use of drugs/alcohol within the last 12 months?: none Previous Attempts/Gestures: Yes How many times?: 7 Other Self Harm Risks: none Triggers for Past Attempts: Other (Comment) (hopelessness) Intentional Self Injurious Behavior: None Family Suicide History: No Recent stressful life event(s): Job Loss, Financial Problems Persecutory voices/beliefs?: No Depression: Yes Depression Symptoms: Despondent, Tearfulness, Isolating, Fatigue, Guilt, Loss of interest in usual pleasures, Feeling worthless/self pity Substance abuse history and/or treatment for substance abuse?: Yes Suicide prevention information given to non-admitted patients: Not applicable  Risk to Others within the past 6 months Homicidal Ideation: No Does patient have any lifetime risk of violence toward others beyond the six months prior to admission? : No Thoughts of Harm to Others: No Current Homicidal Intent: No Current Homicidal Plan: No Access to Homicidal Means: No Identified Victim: none History of harm to others?: No Assessment of Violence: None Noted Violent  Behavior Description: pt denies hx violence Does patient have access to weapons?: No Criminal Charges Pending?: No Does patient have a court date: No Is patient on probation?: No  Psychosis Hallucinations: Visual (floors waving) Delusions: None noted  Mental Status Report Appearance/Hygiene: In hospital gown Eye Contact: Good Motor Activity: Freedom of movement Speech: Logical/coherent Level of Consciousness: Alert Mood: Depressed, Anhedonia, Sad, Anxious Affect: Appropriate to circumstance, Anxious, Sad, Depressed Anxiety Level: Severe Thought Processes: Relevant, Coherent Judgement: Unimpaired Orientation: Person, Place, Time, Situation Obsessive Compulsive Thoughts/Behaviors: None  Cognitive Functioning Concentration: Normal Memory: Recent Intact, Remote Intact IQ: Average Insight: Fair Impulse Control: Fair Appetite: Poor Weight Loss: 25 (in 3 mos) Sleep: Decreased Total Hours of Sleep: 4 Vegetative Symptoms: None  ADLScreening [facility] Assessment Services) Patient's cognitive ability adequate to safely complete daily activities?: Yes Patient able to express need for assistance with ADLs?: Yes Independently performs ADLs?: Yes (appropriate for developmental age)  Prior Inpatient Therapy Prior Inpatient Therapy: Yes Prior Therapy Dates: 2008 and other years Prior Therapy Facilty/Provider(s): [facility] and other facilities Reason for Treatment: MDD, SI  Prior Outpatient Therapy Prior Outpatient Therapy: Yes Prior Therapy Dates: in the past Prior Therapy Facilty/Provider(s): several facilities Reason for Treatment: depression, anxiety Does patient have an ACCT team?: No Does patient have Intensive  In-House Services?  : No Does patient have [facility] services? : Unknown Does patient have P4CC services?: Unknown  ADL Screening (condition at time of admission) Patient's cognitive ability adequate to safely complete daily activities?: Yes Is the patient deaf or have  difficulty hearing?: No Does the patient have difficulty seeing, even when wearing glasses/contacts?: No Does the patient have difficulty concentrating, remembering, or making decisions?: No Patient able to express need for assistance with ADLs?: Yes Does the patient have difficulty dressing or bathing?: No Independently performs ADLs?: Yes (appropriate for developmental age) Does the patient have difficulty walking or climbing stairs?: No Weakness of Legs: None Weakness of Arms/Hands: None  Home Assistive Devices/Equipment Home Assistive Devices/Equipment: Eyeglasses    Abuse/Neglect Assessment (Assessment to be complete while patient is alone) Physical Abuse: Denies Verbal Abuse: Denies Sexual Abuse: Denies Exploitation of patient/patient's resources: Denies Self-Neglect: Denies     Merchant navy officer (For Healthcare) Does patient have an advance directive?: No Would patient like information on creating an advanced directive?: No - patient declined information    Additional Information 1:1 In Past 12 Months?: No CIRT Risk: No Elopement Risk: No Does patient have medical clearance?: No     Disposition:  Disposition Initial Assessment Completed for this Encounter: Yes Disposition of Patient: Inpatient treatment program Type of inpatient treatment program: Adult ([facility]ie [place] NP recommends inpatient treatment)  Ohana Birdwell P 07/09/2016 3:10 PM

## 2016-07-09 NOTE — ED Notes (Addendum)
Called for dinner tray. Patient given Malaw[place]urkey sandwich and drink

## 2016-07-09 NOTE — ED Notes (Addendum)
Patient placed in purple scrubs and will be wanded by security. Belongings sent to nurses desk.

## 2016-07-09 NOTE — ED Notes (Signed)
Spoke to [place]anna from [employer] [employer]. Tylenol and liver enzymes are good. Appears patient did not take tylenol. Suggested to draw lithium level and if any detected then repeat q4-6hrs to ensure level is trending down.

## 2016-07-09 NOTE — ED Triage Notes (Signed)
Pt brought in by EMS due taking 100 tylenol extra strength and 5 remeron yesterday afternoon. Pt did admit that he wanted to hurt himself yesterday and that's why he took all of the pills. Pt denies any SI or HI at this time. Pt a&ox4. Pt has hx of SI and [facility].

## 2016-07-09 NOTE — Progress Notes (Signed)
[facility]per Britta MccreedyBarbara) and [facility] (Per Powers Lakearla). Noted that UA and UDS results are pending- will need to fax results to include with referral once available.  Ilean SkillMeghan Kaitlin Ardito, MSW, LCSW Clinical Social Work, Disposition  07/09/2016 [phone]

## 2016-07-09 NOTE — ED Notes (Signed)
Thurston Hole (sister) # [phone]

## 2016-07-09 NOTE — ED Provider Notes (Signed)
[facility]-EMERGENCY DEPT Provider Note   CSN: [id number] Arrival date & time: 07/09/16  1247     History   Chief Complaint Chief Complaint  Patient presents with  . Ingestion    HPI Christopher Giles is a 50 y.o. male.  [age] yo M with a chief complaint of a overdose. Patient states that he took a entire bottle of extra strength Tylenol. He estimates that he may have taken about 100 pills. He also took 5 Remeron tablets. He took these yesterday about 1 PM. He has had some mild stomach upset and thrown up a few times. Denies any diarrhea.   The history is provided by the patient.  Ingestion  This is a new problem. The current episode started yesterday. The problem occurs constantly. The problem has not changed since onset.Pertinent negatives include no chest pain, no abdominal pain, no headaches and no shortness of breath. Nothing aggravates the symptoms. Nothing relieves the symptoms. He has tried nothing for the symptoms. The treatment provided no relief.    Past Medical History:  Diagnosis Date  . Anxiety   . Depression   . Substance abuse   . Suicide attempt    x 6 per pt  . Thyroid disease    hypo    Patient Active Problem List   Diagnosis Date Noted  . Moderate episode of recurrent major depressive disorder (HCC)   . Severe episode of recurrent major depressive disorder, without psychotic features (HCC)     Past Surgical History:  Procedure Laterality Date  . punctured lung         Home Medications    Prior to Admission medications   Medication Sig Start Date End Date Taking? Authorizing Provider  buPROPion (WELLBUTRIN XL) 150 MG 24 hr tablet Take 1 tablet (150 mg total) by mouth daily. 07/07/16  Yes Truman Haywardakia S Starkes, [name]  cholecalciferol (VITAMIN D) 1000 units tablet Take 1 tablet (1,000 Units total) by mouth daily. 07/07/16  Yes Truman Haywardakia S Starkes, [name]  clonazePAM (KLONOPIN) 0.5 MG tablet Take 1 tablet (0.5 mg total) by mouth 2 (two) times daily as needed for  anxiety. 07/06/16  Yes Truman Haywardakia S Starkes, [name]  gabapentin (NEURONTIN) 300 MG capsule Take 2 capsules (600 mg total) by mouth 3 (three) times daily. 07/06/16  Yes Truman Haywardakia S Starkes, [name]  hydrOXYzine (ATARAX/VISTARIL) 25 MG tablet Take 1 tablet (25 mg total) by mouth every 6 (six) hours as needed for anxiety. 07/06/16  Yes Truman Haywardakia S Starkes, [name]  levothyroxine (SYNTHROID, LEVOTHROID) 75 MCG tablet Take 1 tablet (75 mcg total) by mouth daily before breakfast. 07/07/16  Yes Truman Haywardakia S Starkes, [name]  mirtazapine (REMERON) 30 MG tablet Take 1 tablet (30 mg total) by mouth at bedtime. 07/06/16  Yes Truman Haywardakia S Starkes, [name]  lithium carbonate 150 MG capsule Take 3 capsules (450 mg total) by mouth at bedtime. 07/06/16   Truman Haywardakia S Starkes, [name]  tamsulosin (FLOMAX) 0.4 MG CAPS capsule Take 1 capsule (0.4 mg total) by mouth at bedtime. 07/06/16   Truman Haywardakia S Starkes, [name]    Family History Family History  Problem Relation Age of Onset  . Hyperlipidemia Mother   . Cancer Mother   . Cancer Father   . Diabetes Maternal Grandmother     Social History Social History  Substance Use Topics  . Smoking status: Current Every Day Smoker    Packs/day: 1.00    Types: Cigarettes  . Smokeless tobacco: Never Used  . Alcohol use No  Allergies   Review of patient's allergies indicates no known allergies.   Review of Systems Review of Systems  Constitutional: Negative for chills and fever.  HENT: Negative for congestion and facial swelling.   Eyes: Negative for discharge and visual disturbance.  Respiratory: Negative for shortness of breath.   Cardiovascular: Negative for chest pain and palpitations.  Gastrointestinal: Positive for nausea and vomiting. Negative for abdominal pain and diarrhea.  Musculoskeletal: Negative for arthralgias and myalgias.  Skin: Negative for color change and rash.  Neurological: Negative for tremors, syncope and headaches.  Psychiatric/Behavioral: Negative for confusion and dysphoric mood.      Physical Exam Updated Vital Signs BP 118/85   Pulse 65   Temp 98 F (36.7 C) (Oral)   Resp 23   Ht 6\' 4"  (1.93 m)   Wt 196 lb (88.9 kg)   SpO2 95%   BMI 23.86 kg/m   Physical Exam  Constitutional: He is oriented to person, place, and time. He appears well-developed and well-nourished.  HENT:  Head: Normocephalic and atraumatic.  Eyes: EOM are normal. Pupils are equal, round, and reactive to light.  Neck: Normal range of motion. Neck supple. No JVD present.  Cardiovascular: Normal rate and regular rhythm.  Exam reveals no gallop and no friction rub.   No murmur heard. Pulmonary/Chest: No respiratory distress. He has no wheezes.  Abdominal: He exhibits no distension and no mass. There is no tenderness. There is no rebound and no guarding.  Musculoskeletal: Normal range of motion.  Neurological: He is alert and oriented to person, place, and time.  Skin: No rash noted. No pallor.  Psychiatric: He has a normal mood and affect. His behavior is normal.  Nursing note and vitals reviewed.    ED Treatments / Results  Labs (all labs ordered are listed, but only abnormal results are displayed) Labs Reviewed  ACETAMINOPHEN LEVEL - Abnormal; Notable for the following:       Result Value   Acetaminophen (Tylenol), Serum <10 (*)    All other components within normal limits  COMPREHENSIVE METABOLIC PANEL - Abnormal; Notable for the following:    Sodium 134 (*)    Glucose, Bld 102 (*)    All other components within normal limits  CBC WITH DIFFERENTIAL/PLATELET - Abnormal; Notable for the following:    WBC 13.1 (*)    Neutro Abs 10.3 (*)    Monocytes Absolute 1.2 (*)    All other components within normal limits  ETHANOL  SALICYLATE LEVEL  AMYLASE  PROTIME-INR  RAPID URINE DRUG SCREEN, HOSP PERFORMED  URINALYSIS, ROUTINE W REFLEX MICROSCOPIC (NOT AT [facility])  LITHIUM LEVEL    EKG  EKG Interpretation None       Radiology No results found.  Procedures Procedures  (including critical care time)  Medications Ordered in ED Medications  ondansetron (ZOFRAN) injection 4 mg (4 mg Intravenous Given 07/09/16 1349)     Initial Impression / Assessment and Plan / ED Course  I have reviewed the triage vital signs and the nursing notes.  Pertinent labs & imaging results that were available during my care of the patient were reviewed by me and considered in my medical decision making (see chart for details).  Clinical Course    50 yo M with a cc of Possible Tylenol ingestion. Patient tylenol level is 0. I do not suspect that he took any Tylenol. Discussed with poison control. We'll have TTS evaluate.   TTS recommending inpatient.   The patients results and plan  were reviewed and discussed.   Any x-rays performed were independently reviewed by myself.   Differential diagnosis were considered with the presenting HPI.  Medications  ondansetron (ZOFRAN) injection 4 mg (4 mg Intravenous Given 07/09/16 1349)    Vitals:   07/09/16 1430 07/09/16 1445 07/09/16 1500 07/09/16 1515  BP: 121/84 119/94 121/80 116/87  Pulse: 67 74 66 73  Resp: (!) 28 10 25 17   Temp:      TempSrc:      SpO2: 95% 97% 96% 97%  Weight:      Height:        Final diagnoses:  Suicide attempt     Final Clinical Impressions(s) / ED Diagnoses   Final diagnoses:  None    New Prescriptions New Prescriptions   No medications on file     Melene Plan, DO 07/09/16 1626

## 2016-07-10 MED ORDER — ONDANSETRON HCL 4 MG PO TABS
4.0000 mg | ORAL_TABLET | Freq: Three times a day (TID) | ORAL | Status: DC | PRN
  Administered 2016-07-10: 4 mg via ORAL
  Filled 2016-07-10: qty 1

## 2016-07-10 NOTE — ED Notes (Signed)
Pt changed into his personal clothing - escorted to lobby by RN and security.

## 2016-07-10 NOTE — ED Notes (Signed)
Pt has received a snack and refreshment at this time. Lunch order has been taken at this time.

## 2016-07-10 NOTE — ED Notes (Signed)
Pt on phone at nurses' desk asking person for ride from ED.

## 2016-07-10 NOTE — Progress Notes (Signed)
Disposition CSW completed additional patient referrals to the following inpatient psych facilities:  [facility]  CSW will follow patient for placement needs.  Seward SpeckLeo Davette Nugent [facility] Disposition CSW [phone]

## 2016-07-10 NOTE — ED Notes (Signed)
Vitals obtained by sitter in room.

## 2016-07-10 NOTE — ED Provider Notes (Signed)
2:10 PM Psych feels he's malingering. Said he took "100 tylenol" but tylenol level negative. Refusing the lithium, says he doesn't like it. Initially they asked for repeat lithium level but since he's not taking it won't be helpful. He feels fine now, no signs of suicidal ideation. D/c home with PCP f/u or psychiatrist   Pricilla LovelessScott Zyah Gomm, MD 07/10/16 1443

## 2016-07-10 NOTE — ED Notes (Signed)
Removed IV per nurse request.

## 2016-07-10 NOTE — ED Notes (Signed)
Pt complaining of nausea.   

## 2016-07-10 NOTE — ED Notes (Signed)
[facility]'s belongings [facility] - inventoried. Pt signed Inventory sheet and valuables envelope - verified NO cell phone.

## 2016-07-10 NOTE — ED Notes (Signed)
Pt resting quietly on bed w/eyes closed. Respirations even, unlabored. Pt has not eaten lunch.

## 2016-07-10 NOTE — ED Notes (Signed)
Pt signed Medical Clearance Pt Policy form - verbalized understanding.

## 2016-07-15 ENCOUNTER — Emergency Department (HOSPITAL_COMMUNITY)
Admission: EM | Admit: 2016-07-15 | Discharge: 2016-07-16 | Disposition: A | Discharge status: Other Acute Inpatient Hospital | Payer: Self-pay | Attending: Emergency Medicine | Admitting: Emergency Medicine

## 2016-07-15 DIAGNOSIS — Z79899 Other long term (current) drug therapy: Secondary | ICD-10-CM | POA: Insufficient documentation

## 2016-07-15 DIAGNOSIS — E039 Hypothyroidism, unspecified: Secondary | ICD-10-CM | POA: Insufficient documentation

## 2016-07-15 DIAGNOSIS — F17210 Nicotine dependence, cigarettes, uncomplicated: Secondary | ICD-10-CM | POA: Insufficient documentation

## 2016-07-15 DIAGNOSIS — R45851 Suicidal ideations: Secondary | ICD-10-CM

## 2016-07-15 DIAGNOSIS — F332 Major depressive disorder, recurrent severe without psychotic features: Secondary | ICD-10-CM | POA: Insufficient documentation

## 2016-07-15 LAB — CBC WITH DIFFERENTIAL/PLATELET
BASOS PCT: 0 %
Basophils Absolute: 0 10*3/uL (ref 0.0–0.1)
EOS ABS: 0.1 10*3/uL (ref 0.0–0.7)
EOS PCT: 1 %
HCT: 43.7 % (ref 39.0–52.0)
HEMOGLOBIN: 15.1 g/dL (ref 13.0–17.0)
Lymphocytes Relative: 16 %
Lymphs Abs: 1.5 10*3/uL (ref 0.7–4.0)
MCH: 31.6 pg (ref 26.0–34.0)
MCHC: 34.6 g/dL (ref 30.0–36.0)
MCV: 91.4 fL (ref 78.0–100.0)
MONOS PCT: 7 %
Monocytes Absolute: 0.7 10*3/uL (ref 0.1–1.0)
NEUTROS PCT: 76 %
Neutro Abs: 6.9 10*3/uL (ref 1.7–7.7)
PLATELETS: 266 10*3/uL (ref 150–400)
RBC: 4.78 MIL/uL (ref 4.22–5.81)
RDW: 12.9 % (ref 11.5–15.5)
WBC: 9.1 10*3/uL (ref 4.0–10.5)

## 2016-07-15 LAB — RAPID URINE DRUG SCREEN, HOSP PERFORMED
Amphetamines: NOT DETECTED
Barbiturates: NOT DETECTED
Benzodiazepines: NOT DETECTED
COCAINE: NOT DETECTED
OPIATES: NOT DETECTED
TETRAHYDROCANNABINOL: NOT DETECTED

## 2016-07-15 LAB — COMPREHENSIVE METABOLIC PANEL
ALK PHOS: 73 U/L (ref 38–126)
ALT: 22 U/L (ref 17–63)
ANION GAP: 9 (ref 5–15)
AST: 16 U/L (ref 15–41)
Albumin: 4.6 g/dL (ref 3.5–5.0)
BILIRUBIN TOTAL: 0.7 mg/dL (ref 0.3–1.2)
BUN: 9 mg/dL (ref 6–20)
CALCIUM: 9.4 mg/dL (ref 8.9–10.3)
CO2: 26 mmol/L (ref 22–32)
Chloride: 101 mmol/L (ref 101–111)
Creatinine, Ser: 0.8 mg/dL (ref 0.61–1.24)
GFR calc non Af Amer: >60 mL/min (ref >60)
GLUCOSE: 104 mg/dL — AB (ref 65–99)
Potassium: 3.8 mmol/L (ref 3.5–5.1)
Sodium: 136 mmol/L (ref 135–145)
TOTAL PROTEIN: 7.7 g/dL (ref 6.5–8.1)

## 2016-07-15 LAB — ACETAMINOPHEN LEVEL: Acetaminophen (Tylenol), Serum: <10 ug/mL — ABNORMAL LOW (ref 10–30)

## 2016-07-15 LAB — LITHIUM LEVEL: Lithium Lvl: <0.06 mmol/L — ABNORMAL LOW (ref 0.60–1.20)

## 2016-07-15 LAB — ETHANOL: Alcohol, Ethyl (B): <5 mg/dL (ref <5)

## 2016-07-15 MED ORDER — BUPROPION HCL ER (XL) 150 MG PO TB24
150.0000 mg | ORAL_TABLET | Freq: Every day | ORAL | Status: DC
  Administered 2016-07-16: 150 mg via ORAL
  Filled 2016-07-15: qty 1

## 2016-07-15 MED ORDER — MIRTAZAPINE 30 MG PO TABS
30.0000 mg | ORAL_TABLET | Freq: Every day | ORAL | Status: DC
  Administered 2016-07-15: 30 mg via ORAL
  Filled 2016-07-15: qty 1

## 2016-07-15 MED ORDER — TAMSULOSIN HCL 0.4 MG PO CAPS
0.4000 mg | ORAL_CAPSULE | Freq: Every day | ORAL | Status: DC
  Administered 2016-07-15: 0.4 mg via ORAL
  Filled 2016-07-15: qty 1

## 2016-07-15 MED ORDER — LITHIUM CARBONATE 300 MG PO CAPS
450.0000 mg | ORAL_CAPSULE | Freq: Every day | ORAL | Status: DC
  Administered 2016-07-15: 450 mg via ORAL
  Filled 2016-07-15: qty 1

## 2016-07-15 MED ORDER — LEVOTHYROXINE SODIUM 75 MCG PO TABS
75.0000 ug | ORAL_TABLET | Freq: Every day | ORAL | Status: DC
  Administered 2016-07-16: 75 ug via ORAL
  Filled 2016-07-15: qty 1

## 2016-07-15 MED ORDER — GABAPENTIN 300 MG PO CAPS
600.0000 mg | ORAL_CAPSULE | Freq: Three times a day (TID) | ORAL | Status: DC
  Administered 2016-07-15 – 2016-07-16 (×3): 600 mg via ORAL
  Filled 2016-07-15 (×3): qty 2

## 2016-07-15 MED ORDER — CLONAZEPAM 0.5 MG PO TABS: 0.5000 mg | ORAL_TABLET | Freq: Two times a day (BID) | ORAL | Status: DC | PRN

## 2016-07-15 NOTE — ED Triage Notes (Signed)
Per pt he recently attempted suicide by over dosing on tylenol pm by taking 90 tabs. Pt states that after he took the pills that it did not cause him to be drowsy and after about 24 hours of having nausea and vomiting, pt called EMS [facility]. States he has not attempted suicide since that admission but he is now having those thoughts again and brought self in for eval and treatment.

## 2016-07-15 NOTE — ED Notes (Signed)
Pt brought by [employer], officers left before we could get pt into department.

## 2016-07-15 NOTE — BH Assessment (Addendum)
Tele Assessment Note   Christopher Giles is an 50 y.o. male who presents to the ED voluntarily. Pt reports he has been having suicidal thoughts that have been increasing over the past several days. Pt reports he feels his medication is not working because he is feeling depressed. Pt reports he lives alone and he isolates himself from others due to his depression. Pt reports he does not feel safe at home because he feels like he may do something to himself. Pt denies having a plan and states he has attempted suicide in the past about 10 years ago. Pt stated "I feel miserable all the time and I feel like I am spiraling." Pt asked the assessor if he was going to be able to be admitted to an inpatient hospital because he does not feel he is safe.   Pt reports he does not have a current therapist and reports [facility] and his primary care physician suggested [facility].  Pt endorses depressive symptoms including insomnia, not eating, feeling angry most of the day and excessive crying spells. Pt reports he feels he does not have a support system "because my depression has pushed them away." Pt reports "everything feels dark and it feels like there is no way out."   [name], [name] pt meets criteria for inpt admission .    Diagnosis: Major Depressive Disorder  Past Medical History:  Past Medical History:  Diagnosis Date   Anxiety    Depression    Substance abuse    Suicide attempt    x 6 per pt   Thyroid disease    hypo    Past Surgical History:  Procedure Laterality Date   punctured lung      Family History:  Family History  Problem Relation Age of Onset   Hyperlipidemia Mother    Cancer Mother    Cancer Father    Diabetes Maternal Grandmother     Social History:  reports that he has been smoking Cigarettes.  He has been smoking about 1.00 pack per day. He has never used smokeless tobacco. He reports that he does not drink  alcohol or use drugs.  Additional Social History:  Alcohol / Drug Use Pain Medications: Pt denies abuse  Prescriptions: Pt denies abuse  Over the Counter: Pt denies abuse  History of alcohol / drug use?: Yes Longest period of sobriety (when/how long): 11 yrs  Substance #1 Name of Substance 1: Cocaine  1 - Age of First Use: 31 1 - Amount (size/oz): unknown 1 - Frequency: unknown 1 - Duration: years 1 - Last Use / Amount: 11 years ago   CIWA: CIWA-Ar BP: 109/64 Pulse Rate: 91 COWS:    PATIENT STRENGTHS: (choose at least two) Average or above average intelligence Communication skills General fund of knowledge Motivation for treatment/growth  Allergies: No Known Allergies  Home Medications:  (Not in a hospital admission)  OB/GYN Status:  No LMP for male patient.  General Assessment Data Location of Assessment: [facility] TTS Assessment: In system Is this a Tele or Face-to-Face Assessment?: Face-to-Face Is this an Initial Assessment or a Re-assessment for this encounter?: Initial Assessment Marital status: Single Is patient pregnant?: No Pregnancy Status: No Living Arrangements: Alone Can pt return to current living arrangement?: Yes Admission Status: Voluntary Is patient capable of signing voluntary admission?: Yes Referral Source: Self/Family/Friend Insurance type: none     Crisis Care Plan Living Arrangements: Alone Name of Psychiatrist:  none Name of Therapist: none  Education Status Is patient currently in school?: No Highest grade of school patient has completed: some college  Risk to self with the past 6 months Suicidal Ideation: Yes-Currently Present Has patient been a risk to self within the past 6 months prior to admission? : Yes Suicidal Intent: Yes-Currently Present Has patient had any suicidal intent within the past 6 months prior to admission? : Yes Is patient at risk for suicide?: Yes Suicidal Plan?: No Has patient had any suicidal plan within  the past 6 months prior to admission? : Yes Access to Means: No What has been your use of drugs/alcohol within the last 12 months?: denies Previous Attempts/Gestures: Yes How many times?: 6 Triggers for Past Attempts: Unknown, Other (Comment) (pt reports medication not working, depressing thoughts) Intentional Self Injurious Behavior: None Family Suicide History: No Recent stressful life event(s): Other (Comment) (medication changes ) Persecutory voices/beliefs?: No Depression: Yes Depression Symptoms: Despondent, Insomnia, Tearfulness, Isolating, Guilt, Fatigue, Loss of interest in usual pleasures, Feeling worthless/self pity, Feeling angry/irritable Substance abuse history and/or treatment for substance abuse?: Yes Suicide prevention information given to non-admitted patients: Not applicable  Risk to Others within the past 6 months Homicidal Ideation: No Does patient have any lifetime risk of violence toward others beyond the six months prior to admission? : No Thoughts of Harm to Others: No Current Homicidal Intent: No Current Homicidal Plan: No Access to Homicidal Means: No History of harm to others?: No Assessment of Violence: None Noted Does patient have access to weapons?: No Criminal Charges Pending?: No Does patient have a court date: No Is patient on probation?: No  Psychosis Hallucinations: None noted Delusions: None noted  Mental Status Report Appearance/Hygiene: In scrubs, Unremarkable Eye Contact: Good Motor Activity: Freedom of movement, Mannerisms Speech: Logical/coherent Level of Consciousness: Alert Mood: Depressed, Anxious, Despair, Fearful, Helpless Affect: Anxious, Depressed, Frightened, Sad Anxiety Level: Minimal Thought Processes: Coherent, Relevant Judgement: Impaired Orientation: Person, Place, Situation, Time, Appropriate for developmental age Obsessive Compulsive Thoughts/Behaviors: None  Cognitive Functioning Concentration: Normal Memory:  Recent Intact, Remote Intact IQ: Average Insight: Fair Impulse Control: Fair Appetite: Poor Sleep: Decreased Total Hours of Sleep: 2 Vegetative Symptoms: Staying in bed  ADLScreening [facility] Patient's cognitive ability adequate to safely complete daily activities?: Yes Patient able to express need for assistance with ADLs?: Yes Independently performs ADLs?: Yes (appropriate for developmental age)  Prior Inpatient Therapy Prior Inpatient Therapy: Yes Prior Therapy Dates: multiple Prior Therapy [facility]ilty[facility] [place] Reason for Treatment: MDD, SI  Prior Outpatient Therapy Prior Outpatient Therapy: Yes Prior Therapy Dates: in the past Prior Therapy Facilty/Provider(s): unable to recall specific name Reason for Treatment: depression, anxiety Does patient have an ACCT team?: No Does patient have Intensive In-House Services?  : No Does patient have [facility] services? : No (in the past) Does patient have P4[facility] services?: No  ADL Screening (condition at time of admission) Patient's cognitive ability adequate to safely complete daily activities?: Yes Is the patient deaf or have difficulty hearing?: No Does the patient have difficulty seeing, even when wearing glasses/contacts?: No Does the patient have difficulty concentrating, remembering, or making decisions?: No Patient able to express need for assistance with ADLs?: Yes Does the patient have difficulty dressing or bathing?: No Independently performs ADLs?: Yes (appropriate for developmental age) Does the patient have difficulty walking or climbing stairs?: No Weakness of Legs: None Weakness of Arms/Hands: None  Home Assistive Devices/Equipment Home Assistive Devices/Equipment: Eyeglasses    Abuse/Neglect Assessment (Assessment  to be complete while patient is alone) Physical Abuse: Denies Verbal Abuse: Denies Sexual Abuse: Denies Exploitation of patient/patient's resources: Denies Self-Neglect:  Denies     Merchant navy officer (For Healthcare) Does patient have an advance directive?: No Would patient like information on creating an advanced directive?: No - patient declined information    Additional Information 1:1 In Past 12 Months?: No CIRT Risk: No Elopement Risk: No Does patient have medical clearance?: Yes     Disposition:  Disposition Initial Assessment Completed for this Encounter: Yes Disposition of Patient: Inpatient treatment program Type of inpatient treatment program: Adult (per Nira Conn, [name] )  Karolee Ohs 07/15/2016 9:03 PM

## 2016-07-15 NOTE — ED Notes (Signed)
Patient pleasant and cooperative with care this shift, but does endorse depression. Patient denies suicidal/homicidal ideations at this time and verbally contracts for safety. Patient provided with sandwich and soda upon request.

## 2016-07-15 NOTE — BH Assessment (Addendum)
[facility] 08:00am on 07/16/16. Unable to complete support paperwork due to pt sleeping. Will complete in AM . Accepting is Nira ConnJason Berry, [name] and the attending provider is Dr. Jama Flavorsobos. Call to report [phone].   Princess BruinsAquicha Duff, MSW, Theresia MajorsLCSWA

## 2016-07-15 NOTE — ED Provider Notes (Signed)
[facility]-EMERGENCY DEPT Provider Note   CSN: [id number] Arrival date & time: 07/15/16  1510     History   Chief Complaint Chief Complaint  Patient presents with  . Suicidal    states he attempted suicide last week by over dosing on 90 tabs of tylenol pm    HPI Christopher Giles is a 50 y.o. male.  HPI Pt presents to the ED with complaints of suicidal ideation.  Pt states she took an overdose of tylenol 1 week ago.  He was seen in the ED and evaluated.  His tylenol level was zero.  His treating team was suspicious that he may not have taken any tylenol.  Pt states that he has been feeling depressed since then.  His symptoms are getting worse.  [facility] to [facility] but was not able to get in touch.  He denies any ingestion this time.  He feels like he wants to die but has not specific plan. Past Medical History:  Diagnosis Date  . Anxiety   . Depression   . Substance abuse   . Suicide attempt    x 6 per pt  . Thyroid disease    hypo    Patient Active Problem List   Diagnosis Date Noted  . Moderate episode of recurrent major depressive disorder (HCC)   . Severe episode of recurrent major depressive disorder, without psychotic features (HCC)     Past Surgical History:  Procedure Laterality Date  . punctured lung         Home Medications    Prior to Admission medications   Medication Sig Start Date End Date Taking? Authorizing Provider  buPROPion (WELLBUTRIN XL) 150 MG 24 hr tablet Take 1 tablet (150 mg total) by mouth daily. 07/07/16  Yes Truman Haywardakia S Starkes, [name]  cholecalciferol (VITAMIN D) 1000 units tablet Take 1 tablet (1,000 Units total) by mouth daily. 07/07/16  Yes Truman Haywardakia S Starkes, [name]  clonazePAM (KLONOPIN) 0.5 MG tablet Take 1 tablet (0.5 mg total) by mouth 2 (two) times daily as needed for anxiety. 07/06/16  Yes Truman Haywardakia S Starkes, [name]  gabapentin (NEURONTIN) 300 MG capsule Take 2 capsules (600 mg total) by mouth 3 (three) times daily. 07/06/16  Yes Truman Haywardakia S  Starkes, [name]  hydrOXYzine (ATARAX/VISTARIL) 25 MG tablet Take 1 tablet (25 mg total) by mouth every 6 (six) hours as needed for anxiety. 07/06/16  Yes Truman Haywardakia S Starkes, [name]  levothyroxine (SYNTHROID, LEVOTHROID) 75 MCG tablet Take 1 tablet (75 mcg total) by mouth daily before breakfast. 07/07/16  Yes Truman Haywardakia S Starkes, [name]  lithium carbonate 150 MG capsule Take 3 capsules (450 mg total) by mouth at bedtime. 07/06/16  Yes Truman Haywardakia S Starkes, [name]  mirtazapine (REMERON) 30 MG tablet Take 1 tablet (30 mg total) by mouth at bedtime. 07/06/16  Yes Truman Haywardakia S Starkes, [name]  tamsulosin (FLOMAX) 0.4 MG CAPS capsule Take 1 capsule (0.4 mg total) by mouth at bedtime. 07/06/16  Yes Truman Haywardakia S Starkes, [name]    Family History Family History  Problem Relation Age of Onset  . Hyperlipidemia Mother   . Cancer Mother   . Cancer Father   . Diabetes Maternal Grandmother     Social History Social History  Substance Use Topics  . Smoking status: Current Every Day Smoker    Packs/day: 1.00    Types: Cigarettes  . Smokeless tobacco: Never Used  . Alcohol use No     Allergies   Review of patient's allergies indicates no known allergies.  Review of Systems Review of Systems  All other systems reviewed and are negative.    Physical Exam Updated Vital Signs BP 111/84 (BP Location: Left Arm)   Pulse 82   Temp 98.2 F (36.8 C) (Oral)   Resp 18   Ht 6\' 4"  (1.93 m)   Wt 88.9 kg   SpO2 95%   BMI 23.86 kg/m   Physical Exam  Constitutional: He appears well-developed and well-nourished. No distress.  HENT:  Head: Normocephalic and atraumatic.  Right Ear: External ear normal.  Left Ear: External ear normal.  Eyes: Conjunctivae are normal. Right eye exhibits no discharge. Left eye exhibits no discharge. No scleral icterus.  Neck: Neck supple. No tracheal deviation present.  Cardiovascular: Normal rate.   Pulmonary/Chest: Effort normal. No stridor. No respiratory distress.  Abdominal: He exhibits no  distension.  Musculoskeletal: He exhibits no edema.  Neurological: He is alert. Cranial nerve deficit: no gross deficits.  Skin: Skin is warm and dry. No rash noted.  Psychiatric: His speech is not rapid and/or pressured and not slurred. He is not aggressive, not hyperactive, not withdrawn and not combative. Cognition and memory are normal. He exhibits a depressed mood. He expresses suicidal ideation. He expresses no homicidal ideation. He expresses no suicidal plans and no homicidal plans. He is communicative.  Nursing note and vitals reviewed.    ED Treatments / Results  Labs (all labs ordered are listed, but only abnormal results are displayed) Labs Reviewed  COMPREHENSIVE METABOLIC PANEL - Abnormal; Notable for the following:       Result Value   Glucose, Bld 104 (*)    All other components within normal limits  ACETAMINOPHEN LEVEL - Abnormal; Notable for the following:    Acetaminophen (Tylenol), Serum <10 (*)    All other components within normal limits  LITHIUM LEVEL - Abnormal; Notable for the following:    Lithium Lvl <0.06 (*)    All other components within normal limits  ETHANOL  CBC WITH DIFFERENTIAL/PLATELET  RAPID URINE DRUG SCREEN, HOSP PERFORMED   Procedures Procedures (including critical care time)  Medications Ordered in ED Medications  buPROPion (WELLBUTRIN XL) 24 hr tablet 150 mg (not administered)  clonazePAM (KLONOPIN) tablet 0.5 mg (not administered)  gabapentin (NEURONTIN) capsule 600 mg (not administered)  levothyroxine (SYNTHROID, LEVOTHROID) tablet 75 mcg (not administered)  lithium carbonate capsule 450 mg (not administered)  mirtazapine (REMERON) tablet 30 mg (not administered)  tamsulosin (FLOMAX) capsule 0.4 mg (not administered)     Initial Impression / Assessment and Plan / ED Course  I have reviewed the triage vital signs and the nursing notes.  Pertinent labs & imaging results that were available during my care of the patient were  reviewed by me and considered in my medical decision making (see chart for details).  Clinical Course  Labs reviewed.  Medically cleared.  Stable for psychiatric assessment  Final Clinical Impressions(s) / ED Diagnoses   Final diagnoses:  Suicidal ideation     Linwood DibblesJon Chalyn Amescua, MD 07/15/16 1737

## 2016-07-16 ENCOUNTER — Encounter (HOSPITAL_COMMUNITY): Payer: Self-pay | Admitting: *Deleted

## 2016-07-16 ENCOUNTER — Inpatient Hospital Stay (HOSPITAL_COMMUNITY)
Admission: EM | Admit: 2016-07-16 | Discharge: 2016-07-28 | DRG: 885 | Disposition: A | Discharge status: Home / Self Care | Payer: Federal, State, Local not specified - Other | Source: Intra-hospital | Attending: Psychiatry | Admitting: Psychiatry

## 2016-07-16 DIAGNOSIS — E039 Hypothyroidism, unspecified: Secondary | ICD-10-CM | POA: Diagnosis present

## 2016-07-16 DIAGNOSIS — R45851 Suicidal ideations: Secondary | ICD-10-CM | POA: Diagnosis present

## 2016-07-16 DIAGNOSIS — Z808 Family history of malignant neoplasm of other organs or systems: Secondary | ICD-10-CM | POA: Diagnosis not present

## 2016-07-16 DIAGNOSIS — Z818 Family history of other mental and behavioral disorders: Secondary | ICD-10-CM

## 2016-07-16 DIAGNOSIS — F17210 Nicotine dependence, cigarettes, uncomplicated: Secondary | ICD-10-CM | POA: Diagnosis present

## 2016-07-16 DIAGNOSIS — F332 Major depressive disorder, recurrent severe without psychotic features: Principal | ICD-10-CM | POA: Diagnosis present

## 2016-07-16 DIAGNOSIS — Z79899 Other long term (current) drug therapy: Secondary | ICD-10-CM | POA: Diagnosis not present

## 2016-07-16 DIAGNOSIS — Z915 Personal history of self-harm: Secondary | ICD-10-CM | POA: Diagnosis not present

## 2016-07-16 DIAGNOSIS — Z833 Family history of diabetes mellitus: Secondary | ICD-10-CM

## 2016-07-16 DIAGNOSIS — Z8349 Family history of other endocrine, nutritional and metabolic diseases: Secondary | ICD-10-CM | POA: Diagnosis not present

## 2016-07-16 DIAGNOSIS — Z8489 Family history of other specified conditions: Secondary | ICD-10-CM | POA: Diagnosis not present

## 2016-07-16 DIAGNOSIS — Z8249 Family history of ischemic heart disease and other diseases of the circulatory system: Secondary | ICD-10-CM | POA: Diagnosis not present

## 2016-07-16 MED ORDER — BUPROPION HCL ER (XL) 150 MG PO TB24
150.0000 mg | ORAL_TABLET | Freq: Every day | ORAL | Status: DC
  Filled 2016-07-16 (×3): qty 1

## 2016-07-16 MED ORDER — CLONAZEPAM 0.5 MG PO TABS
0.5000 mg | ORAL_TABLET | Freq: Two times a day (BID) | ORAL | Status: DC
  Administered 2016-07-16 – 2016-07-28 (×24): 0.5 mg via ORAL
  Filled 2016-07-16 (×25): qty 1

## 2016-07-16 MED ORDER — ACETAMINOPHEN 325 MG PO TABS
650.0000 mg | ORAL_TABLET | Freq: Four times a day (QID) | ORAL | Status: DC | PRN
  Administered 2016-07-22: 650 mg via ORAL
  Filled 2016-07-16: qty 2

## 2016-07-16 MED ORDER — GABAPENTIN 300 MG PO CAPS
600.0000 mg | ORAL_CAPSULE | Freq: Three times a day (TID) | ORAL | Status: DC
  Administered 2016-07-16: 600 mg via ORAL
  Filled 2016-07-16 (×6): qty 2

## 2016-07-16 MED ORDER — TAMSULOSIN HCL 0.4 MG PO CAPS
0.4000 mg | ORAL_CAPSULE | Freq: Every day | ORAL | Status: DC
  Administered 2016-07-16 – 2016-07-22 (×7): 0.4 mg via ORAL
  Filled 2016-07-16 (×8): qty 1

## 2016-07-16 MED ORDER — ALUM & MAG HYDROXIDE-SIMETH 200-200-20 MG/5ML PO SUSP
30.0000 mL | ORAL | Status: DC | PRN
  Administered 2016-07-17 – 2016-07-19 (×2): 30 mL via ORAL
  Filled 2016-07-16 (×2): qty 30

## 2016-07-16 MED ORDER — SERTRALINE HCL 50 MG PO TABS
50.0000 mg | ORAL_TABLET | Freq: Every day | ORAL | Status: DC
  Administered 2016-07-17 – 2016-07-19 (×3): 50 mg via ORAL
  Filled 2016-07-16 (×6): qty 1

## 2016-07-16 MED ORDER — INFLUENZA VAC SPLIT QUAD 0.5 ML IM SUSY
0.5000 mL | PREFILLED_SYRINGE | INTRAMUSCULAR | Status: AC
  Administered 2016-07-18: 0.5 mL via INTRAMUSCULAR
  Filled 2016-07-16: qty 0.5

## 2016-07-16 MED ORDER — LITHIUM CARBONATE 150 MG PO CAPS
450.0000 mg | ORAL_CAPSULE | Freq: Every day | ORAL | Status: DC
  Filled 2016-07-16 (×2): qty 3

## 2016-07-16 MED ORDER — MAGNESIUM HYDROXIDE 400 MG/5ML PO SUSP: 30.0000 mL | Freq: Every day | ORAL | Status: DC | PRN

## 2016-07-16 MED ORDER — TRAZODONE HCL 50 MG PO TABS
25.0000 mg | ORAL_TABLET | Freq: Every evening | ORAL | Status: DC | PRN
  Administered 2016-07-16 – 2016-07-27 (×12): 25 mg via ORAL
  Filled 2016-07-16 (×12): qty 1
  Filled 2016-07-16: qty 4

## 2016-07-16 MED ORDER — MIRTAZAPINE 30 MG PO TABS
30.0000 mg | ORAL_TABLET | Freq: Every day | ORAL | Status: DC
  Filled 2016-07-16 (×2): qty 1

## 2016-07-16 MED ORDER — LEVOTHYROXINE SODIUM 75 MCG PO TABS
75.0000 ug | ORAL_TABLET | Freq: Every day | ORAL | Status: DC
  Administered 2016-07-17 – 2016-07-28 (×12): 75 ug via ORAL
  Filled 2016-07-16 (×3): qty 1
  Filled 2016-07-16: qty 7
  Filled 2016-07-16 (×10): qty 1

## 2016-07-16 NOTE — Progress Notes (Signed)
Patient attempted to OD on tylenol PM a week ago and [facility].  He then had a follow up appt with his PCP Ewing Schleinandance Smith here in [place].  He called 911 yesterday due to SI thoughts.  He lives in an apartment alone and sts hes lonely and he has a lot of financial problems.  Has not been employed since July 2017.  [facility] to this [facility].

## 2016-07-16 NOTE — Progress Notes (Signed)
D    Pt is pleasant on approach and cooperative    He is a bit confused and hyperverbal with poor memory and disorganized thinking   He wanted to discuss his medications but was having a difficult time articulating due to his disorganization    He interacts appropriately with others and attends groups A    Verbal support given   Medications administered and effectiveness monitored    Q 15 min checks R   Pt is safe at present and receptive to verbal support

## 2016-07-16 NOTE — H&P (Signed)
Psychiatric Admission Assessment Adult  Patient Identification: Christopher Giles MRN:  [id number] Date of Evaluation:  07/16/2016 Chief Complaint:  mdd Principal Diagnosis: MDD (major depressive disorder), recurrent severe, without psychosis ([place]) Diagnosis:   Patient Active Problem List   Diagnosis Date Noted  . MDD (major depressive disorder), recurrent severe, without psychosis ([place]) [F33.2] 07/16/2016  . Moderate episode of recurrent major depressive disorder ([place]) [F33.1]   . Severe episode of recurrent major depressive disorder, without psychotic features ([place]) [F33.2]    History of Present Illness: Patient discharged from our behavioral health facility after staying 06/13/16-07/06/16. He reports ongoing depression since release and stated that last Thursday which would've been 07/08/16 he took 90 Tylenol PMs in a suicide attempt but reports he threw them up. He called 911 with complaints of suicidal ideation yesterday. He reports that he return to his apartment to find it moldy and felt lonely  after being released. He identifies as a significant stressor that he has not had a job since a 5 month stint working on the [employer] at [employer] which ended at the end of July 2017. Patient presents with prescriptions for gabapentin, Wellbutrin, Remeron, and lithium but reports the medications are not helpful and says "thank God" when I suggest that we rationalize his medications at this time.  Patient reports that he is having ongoing suicidal ideation but states he does not feel he would act immediately and could contact staff if his feelings worsened. He denies any homicidal ideation, plan or intent. He denies any psychotic symptoms.  Patient also identifies financial stressors and may be facing bankruptcy and/or eviction. He reports that he has tried Zoloft in the past and found it worked the best for him for about 10 years along with low-dose Klonopin when necessary. He reports however  about a year ago it seemed that the medication was no longer working at a dose of  150 mg and he switched Cymbalta with mixed results. He was being followed by his primary care physician. 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 His mother did pass away the patient thinks from old age in October 2017.  Patient states that looking back on it he thinks he started feeling depressed around 50 years of age and first had treatment about age 45. Per chart he has had at least 6 prior suicide attempts, has had ECT and has been hospitalized [facility] and also in this hospital in October 2017.  Patient denies any current drug or alcohol use. He states that he did use marijuana cocaine and alcohol but has had no use for the past 11 years. Associated Signs/Symptoms: Depression Symptoms:  depressed mood, anhedonia, feelings of worthlessness/guilt, hopelessness, suicidal thoughts with specific plan, suicidal attempt, anxiety, disturbed sleep, (Hypo) Manic Symptoms:  none Anxiety Symptoms:  Excessive Worry, Psychotic Symptoms:  none PTSD Symptoms: Negative Total Time spent with patient: 45 minutes  Past Psychiatric History: see HPI  Is the patient at risk to self? Yes.    Has the patient been a risk to self in the past 6 months? Yes.    Has the patient been a risk to self within the distant past? Yes.    Is the patient a risk  to others? No.  Has the patient been a risk to others in the past 6 months? No.  Has the patient been a risk to others within the distant past? No.   Prior Inpatient Therapy:  yes  Prior Outpatient Therapy:  yes  Alcohol Screening: Patient refused Alcohol Screening Tool: Yes 1. How often do you have a drink containing alcohol?: Never 9.  Have you or someone else been injured as a result of your drinking?: No 10. Has a relative or friend or a doctor or another health worker been concerned about your drinking or suggested you cut down?: No Alcohol Use Disorder Identification Test Final Score (AUDIT): 0 Substance Abuse History in the last 12 months:  No. Consequences of Substance Abuse: Negative Previous Psychotropic Medications: Yes  Psychological Evaluations: Yes  Past Medical History:  Past Medical History:  Diagnosis Date  . Anxiety   . Depression   . Substance abuse   . Suicide attempt    x 6 per pt  . Thyroid disease    hypo    Past Surgical History:  Procedure Laterality Date  . punctured lung     Family History:  Family History  Problem Relation Age of Onset  . Hyperlipidemia Mother   . Cancer Mother   . Cancer Father   . Diabetes Maternal Grandmother    Family Psychiatric  History: Father with depression, mother with anxiety Tobacco Screening: Have you used any form of tobacco in the last 30 days? (Cigarettes, Smokeless Tobacco, Cigars, and/or Pipes): Yes Tobacco use, Select all that apply: 5 or more cigarettes per day Are you interested in Tobacco Cessation Medications?: Yes, will notify MD for an order Counseled patient on smoking cessation including recognizing danger situations, developing coping skills and basic information about quitting provided: Refused/Declined practical counseling Social History:  History  Alcohol Use No     History  Drug Use No    Additional Social History:  reports high school diploma plus attended classes at [employer] and [employer] and some accounting. He reports that he has had an assortment of jobs and has no one particular trade. He is single never married and has no children he identifies as heterosexual. He reports that currently he does not have a significant other and has not had one for the past 4 years. He does have his own  apartment but states that he dreads going back there.                         Allergies:  No Known Allergies Lab Results:  Results for orders placed or performed during the hospital encounter of 07/15/16 (from the past 48 hour(s))  Urine rapid drug screen (hosp performed)not at [facility]     Status: None   Collection Time: 07/15/16  4:00 PM  Result Value Ref Range   Opiates NONE DETECTED NONE DETECTED   Cocaine NONE DETECTED NONE DETECTED   Benzodiazepines NONE DETECTED NONE DETECTED   Amphetamines NONE DETECTED NONE DETECTED   Tetrahydrocannabinol NONE DETECTED NONE DETECTED   Barbiturates NONE DETECTED NONE DETECTED    Comment:        DRUG SCREEN FOR MEDICAL PURPOSES ONLY.  IF CONFIRMATION IS NEEDED FOR ANY PURPOSE, NOTIFY LAB WITHIN 5 DAYS.        LOWEST DETECTABLE LIMITS FOR URINE DRUG SCREEN Drug Class       Cutoff (ng/mL) Amphetamine      1000 Barbiturate  200 Benzodiazepine   342 Tricyclics       876 Opiates          300 Cocaine          300 THC              50   Comprehensive metabolic panel     Status: Abnormal   Collection Time: 07/15/16  4:32 PM  Result Value Ref Range   Sodium 136 135 - 145 mmol/L   Potassium 3.8 3.5 - 5.1 mmol/L   Chloride 101 101 - 111 mmol/L   CO2 26 22 - 32 mmol/L   Glucose, Bld 104 (H) 65 - 99 mg/dL   BUN 9 6 - 20 mg/dL   Creatinine, Ser 0.80 0.61 - 1.24 mg/dL   Calcium 9.4 8.9 - 10.3 mg/dL   Total Protein 7.7 6.5 - 8.1 g/dL   Albumin 4.6 3.5 - 5.0 g/dL   AST 16 15 - 41 U/L   ALT 22 17 - 63 U/L   Alkaline Phosphatase 73 38 - 126 U/L   Total Bilirubin 0.7 0.3 - 1.2 mg/dL   GFR calc non Af Amer >60 >60 mL/min   GFR calc Af Amer >60 >60 mL/min    Comment: (NOTE) The eGFR has been calculated using the CKD EPI equation. This calculation has not been validated in all clinical situations. eGFR's persistently <60 mL/min signify possible Chronic Kidney Disease.    Anion gap 9 5 - 15  Ethanol     Status: None   Collection  Time: 07/15/16  4:32 PM  Result Value Ref Range   Alcohol, Ethyl (B) <5 <5 mg/dL    Comment:        LOWEST DETECTABLE LIMIT FOR SERUM ALCOHOL IS 5 mg/dL FOR MEDICAL PURPOSES ONLY   CBC with Diff     Status: None   Collection Time: 07/15/16  4:32 PM  Result Value Ref Range   WBC 9.1 4.0 - 10.5 K/uL   RBC 4.78 4.22 - 5.81 MIL/uL   Hemoglobin 15.1 13.0 - 17.0 g/dL   HCT 43.7 39.0 - 52.0 %   MCV 91.4 78.0 - 100.0 fL   MCH 31.6 26.0 - 34.0 pg   MCHC 34.6 30.0 - 36.0 g/dL   RDW 12.9 11.5 - 15.5 %   Platelets 266 150 - 400 K/uL   Neutrophils Relative % 76 %   Neutro Abs 6.9 1.7 - 7.7 K/uL   Lymphocytes Relative 16 %   Lymphs Abs 1.5 0.7 - 4.0 K/uL   Monocytes Relative 7 %   Monocytes Absolute 0.7 0.1 - 1.0 K/uL   Eosinophils Relative 1 %   Eosinophils Absolute 0.1 0.0 - 0.7 K/uL   Basophils Relative 0 %   Basophils Absolute 0.0 0.0 - 0.1 K/uL  Acetaminophen level     Status: Abnormal   Collection Time: 07/15/16  4:32 PM  Result Value Ref Range   Acetaminophen (Tylenol), Serum <10 (L) 10 - 30 ug/mL    Comment:        THERAPEUTIC CONCENTRATIONS VARY SIGNIFICANTLY. A RANGE OF 10-30 ug/mL MAY BE AN EFFECTIVE CONCENTRATION FOR MANY PATIENTS. HOWEVER, SOME ARE BEST TREATED AT CONCENTRATIONS OUTSIDE THIS RANGE. ACETAMINOPHEN CONCENTRATIONS >150 ug/mL AT 4 HOURS AFTER INGESTION AND >50 ug/mL AT 12 HOURS AFTER INGESTION ARE OFTEN ASSOCIATED WITH TOXIC REACTIONS.   Lithium level     Status: Abnormal   Collection Time: 07/15/16  4:32 PM  Result Value Ref Range   Lithium  Lvl <0.06 (L) 0.60 - 1.20 mmol/L    Blood Alcohol level:  Lab Results  Component Value Date   ETH <5 07/15/2016   ETH <5 88/50/2774    Metabolic Disorder Labs:  No results found for: HGBA1C, MPG No results found for: PROLACTIN No results found for: CHOL, TRIG, HDL, CHOLHDL, VLDL, LDLCALC  Current Medications: Current Facility-Administered Medications  Medication Dose Route Frequency Provider Last  Rate Last Dose  . acetaminophen (TYLENOL) tablet 650 mg  650 mg Oral Q6H PRN Patrecia Pour, NP      . alum & mag hydroxide-simeth (MAALOX/MYLANTA) 200-200-20 MG/5ML suspension 30 mL  30 mL Oral Q4H PRN Patrecia Pour, NP      . clonazePAM Bobbye [place]) tablet 0.5 mg  0.5 mg Oral BID Linard Millers, MD      . Derrill Memo ON 07/17/2016] Influenza vac split quadrivalent PF (FLUARIX) injection 0.5 mL  0.5 mL Intramuscular Tomorrow-1000 Jenne Campus, MD      . Derrill Memo ON 07/17/2016] levothyroxine (SYNTHROID, LEVOTHROID) tablet 75 mcg  75 mcg Oral QAC breakfast Patrecia Pour, NP      . magnesium hydroxide (MILK OF MAGNESIA) suspension 30 mL  30 mL Oral Daily PRN Patrecia Pour, NP      . Derrill Memo ON 07/17/2016] sertraline (ZOLOFT) tablet 50 mg  50 mg Oral Daily Linard Millers, MD      . tamsulosin [facility]) capsule 0.4 mg  0.4 mg Oral QHS Patrecia Pour, NP      . traZODone (DESYREL) tablet 25 mg  25 mg Oral QHS PRN Linard Millers, MD       PTA Medications: Prescriptions Prior to Admission  Medication Sig Dispense Refill Last Dose  . buPROPion (WELLBUTRIN XL) 150 MG 24 hr tablet Take 1 tablet (150 mg total) by mouth daily. 30 tablet 0 07/14/2016 at Unknown time  . cholecalciferol (VITAMIN D) 1000 units tablet Take 1 tablet (1,000 Units total) by mouth daily. 30 tablet 0 07/14/2016 at Unknown time  . gabapentin (NEURONTIN) 300 MG capsule Take 2 capsules (600 mg total) by mouth 3 (three) times daily. 60 capsule 0 07/14/2016 at Unknown time  . hydrOXYzine (ATARAX/VISTARIL) 25 MG tablet Take 1 tablet (25 mg total) by mouth every 6 (six) hours as needed for anxiety. 30 tablet 0 07/14/2016 at Unknown time  . levothyroxine (SYNTHROID, LEVOTHROID) 75 MCG tablet Take 1 tablet (75 mcg total) by mouth daily before breakfast. 30 tablet 0 07/15/2016 at Unknown time  . lithium carbonate 150 MG capsule Take 3 capsules (450 mg total) by mouth at bedtime. 45 capsule 0 Past Week at Unknown time  . mirtazapine  (REMERON) 30 MG tablet Take 1 tablet (30 mg total) by mouth at bedtime. 30 tablet 0 Past Week at Unknown time  . tamsulosin (FLOMAX) 0.4 MG CAPS capsule Take 1 capsule (0.4 mg total) by mouth at bedtime. 30 capsule 0 Past Week at Unknown time    Musculoskeletal: Strength & Muscle Tone: within normal limits Gait & Station: normal Patient leans: N/A  Psychiatric Specialty Exam: Physical Exam  Constitutional: He is oriented to person, place, and time. He appears well-developed and well-nourished.  HENT:  Head: Normocephalic and atraumatic.  Right Ear: External ear normal.  Left Ear: External ear normal.  Nose: Nose normal.  Mouth/Throat: Oropharynx is clear and moist.  Eyes: Conjunctivae and EOM are normal. Pupils are equal, round, and reactive to light.  Neck: Normal range of motion. Neck supple.  Respiratory:  Effort normal.  Musculoskeletal: Normal range of motion.  Neurological: He is alert and oriented to person, place, and time.  Psychiatric: His behavior is normal.    Review of Systems  Musculoskeletal: Positive for joint pain.    Blood pressure 104/70, pulse (!) 106, temperature 98 F (36.7 C), temperature source Oral, resp. rate 16, height 6' 4" (1.93 m), weight 97.5 kg (215 lb), SpO2 97 %.Body mass index is 26.17 kg/m.  General Appearance: Casual and Disheveled  Eye Contact:  Good  Speech:  Clear and Coherent  Volume:  Normal  Mood:  Depressed  Affect:  Congruent  Thought Process:  Coherent  Orientation:  Full (Time, Place, and Person)  Thought Content:  Negative  Suicidal Thoughts:  Yes.  with intent/plan  Homicidal Thoughts:  No  Memory:  Negative  Judgement:  Fair  Insight:  Fair  Psychomotor Activity:  Normal  Concentration:  Concentration: Good and Attention Span: Good  Recall:  Good  Fund of Knowledge:  Good  Language:  Good  Akathisia:  No  Handed:  Right  AIMS (if indicated):     Assets:  Resilience  ADL's:  Intact  Cognition:  WNL  Sleep:        Treatment Plan Summary: Daily contact with patient to assess and evaluate symptoms and progress in treatment, Medication management and We will discontinue current medications and begin Zoloft 50 mg and advance as tolerated perhaps to a higher dose of 100 250 mg. Will place patient on Klonopin 0.5 mg by mouth twice a day as it is a safe and effective antianxiety anxiolytic in a patient without significant substance use problems. Patient was cautioned not to discuss her disclose his medications as he is being currently housed on a 4 with people with substance use disorders. Patient reports approximately a week ago overdosing on Tylenol and not seeking medical attention 11/2 liver function tests are within normal limits and we will repeat in a few days however if he was going to have liver failure it should be evident by now. Patient currently does express some active suicidal ideation but contracts for safety and states he would contact staff if he feels overwhelmed by these feelings. Patient reports trazodone effective but made his blood pressure very low the last time he tried it so we will order a trazodone when necessary for sleep at 25 mg patient was counseled that his sleep-wake patterns are disrupted right now and if he lies down during the day he cannot expect to have good sleep at night and he acknowledges this.  Observation Level/Precautions:  15 minute checks  Laboratory:  see labs  Psychotherapy:    Medications:    Consultations:    Discharge Concerns:    Estimated LOS:  Other:     Physician Treatment Plan for Primary Diagnosis: MDD (major depressive disorder), recurrent severe, without psychosis ([place]) Long Term Goal(s): Improvement in symptoms so as ready for discharge  Short Term Goals: Ability to disclose and discuss suicidal ideas and Ability to demonstrate self-control will improve  Physician Treatment Plan for Secondary Diagnosis: Principal Problem:   MDD (major depressive  disorder), recurrent severe, without psychosis ([place])  Long Term Goal(s): Improvement in symptoms so as ready for discharge  Short Term Goals: Ability to identify changes in lifestyle to reduce recurrence of condition will improve  I certify that inpatient services furnished can reasonably be expected to improve the patient's condition.    Linard Millers, MD 11/3/20172:14 PM

## 2016-07-16 NOTE — ED Notes (Signed)
[facility] MD order. Pt signed for personal belongings and property given to [facility] for transfer. Pt signed e-signature. Ambulatory off unit.

## 2016-07-16 NOTE — [facility] Group Notes (Signed)
[facility] LCSW Group Therapy 07/16/2016  1:15 PM   Type of Therapy: Group Therapy  Participation Level: Did Not Attend/Participate. Patient joined within 5 minutes of the end of group discussion.   Samuella BruinKristin Taedyn Glasscock, MSW, LCSW Clinical Social Worker [facility] [phone]
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
## 2016-07-16 NOTE — Progress Notes (Signed)
Pt attended the evening AA speaker meeting. Pt was engaged and participated. 07/16/16 10:01 PM Ladon Heney C, NT 

## 2016-07-16 NOTE — [facility] Suicide Risk Assessment (Signed)
[facility]   Nursing information obtained from:  Patient Demographic factors:  Male, Caucasian, Low socioeconomic status, Living alone, Unemployed Current Mental Status:  Self-harm thoughts Loss Factors:  Financial problems / change in socioeconomic status Historical Factors:  Prior suicide attempts Risk Reduction Factors:  Sense of responsibility to family  Total Time spent with patient: 45 minutes Principal Problem: MDD (major depressive disorder), recurrent severe, without psychosis (HCC) Diagnosis:   Patient Active Problem List   Diagnosis Date Noted  . MDD (major depressive disorder), recurrent severe, without psychosis (HCC) [F33.2] 07/16/2016  . Moderate episode of recurrent major depressive disorder (HCC) [F33.1]   . Severe episode of recurrent major depressive disorder, without psychotic features (HCC) [F33.2]    Subjective Data: Patient denies any homicidal ideation, plan or intent at present. He does admit to active suicidal ideations but states he does not plan to act immediately and will let staff know if this changes.  Continued Clinical Symptoms:  Alcohol Use Disorder Identification Test Final Score (AUDIT): 0 The "Alcohol Use Disorders Identification Test", Guidelines for Use in Primary Care, Second Edition.  World Science writerHealth Organization [facility](WHO). Score between 0-7:  no or low risk or alcohol related problems. Score between 8-15:  moderate risk of alcohol related problems. Score between 16-19:  high risk of alcohol related problems. Score 20 or above:  warrants further diagnostic evaluation for alcohol dependence and treatment.   CLINICAL FACTORS:   Dysthymia More than one psychiatric diagnosis Previous Psychiatric Diagnoses and Treatments   Musculoskeletal: Strength & Muscle Tone: within normal limits Gait & Station: normal Patient leans: N/A  Psychiatric Specialty Exam: Physical Exam  ROS  Blood pressure 104/70, pulse (!) 106,  temperature 98 F (36.7 C), temperature source Oral, resp. rate 16, height 6\' 4"  (1.93 m), weight 97.5 kg (215 lb), SpO2 97 %.Body mass index is 26.17 kg/m.   General Appearance: Casual and Disheveled  Eye Contact:  Good  Speech:  Clear and Coherent  Volume:  Normal  Mood:  Depressed  Affect:  Congruent  Thought Process:  Coherent  Orientation:  Full (Time, Place, and Person)  Thought Content:  Negative  Suicidal Thoughts:  Yes.  with intent/plan  Homicidal Thoughts:  No  Memory:  Negative  Judgement:  Fair  Insight:  Fair  Psychomotor Activity:  Normal  Concentration:  Concentration: Good and Attention Span: Good  Recall:  Good  Fund of Knowledge:  Good  Language:  Good  Akathisia:  No  Handed:  Right  AIMS (if indicated):     Assets:  Resilience  ADL's:  Intact  Cognition:  WNL  Sleep:       COGNITIVE FEATURES THAT CONTRIBUTE TO RISK:  Polarized thinking    SUICIDE RISK:   Severe:  Frequent, intense, and enduring suicidal ideation, specific plan, no subjective intent, but some objective markers of intent (i.e., choice of lethal method), the method is accessible, some limited preparatory behavior, evidence of impaired self-control, severe dysphoria/symptomatology, multiple risk factors present, and few if any protective factors, particularly a lack of social support.   PLAN OF CARE: see PAA  I certify that inpatient services furnished can reasonably be expected to improve the patient's condition.  Acquanetta SitElizabeth Woods Wandy Bossler, MD 07/16/2016, 2:29 PM

## 2016-07-17 NOTE — Progress Notes (Signed)
D) Pt. Remains depressed. Rates his depression at a 10, hopelessness at an 8, and his anxiety at an 8. Affect is flat and mood depressed. Fell asleep this afternoon after attending the group. Went to dinner but states "I just feel very depressed. I hope the medications that they have put me on work starts working. Eye contact is good. Denies SI and HI. A) Provided with a 1:1. Given support, reassurance and praise.  R) Denies SI and HI. Affect is flat and mood depressed.

## 2016-07-17 NOTE — Progress Notes (Signed)
D    Pt is pleasant on approach and cooperative    He is a bit confused and hyperverbal with poor memory and disorganized thinking   He wanted to discuss his medications but was having a difficult time articulating due to his disorganization    He interacts appropriately with others and attends groups A    Verbal support given   Medications administered and effectiveness monitored    Q 15 min checks R   Pt is safe at present and receptive to verbal support 

## 2016-07-17 NOTE — Tx Team (Signed)
Initial Treatment Plan 07/17/2016 1:33 AM Christopher BanningJames C Music [id number]    PATIENT STRESSORS: No support Medications not working  PATIENT STRENGTHS: Average or above average intelligence General fund of knowledge Motivation for treatment/growth   PATIENT IDENTIFIED PROBLEMS: "medications adjusted"  "help with not sleeping"                   DISCHARGE CRITERIA:  Improved stabilization in mood, thinking, and/or behavior Motivation to continue treatment in a less acute level of care Verbal commitment to aftercare and medication compliance  PRELIMINARY DISCHARGE PLAN: Attend aftercare/continuing care group Attend 12-step recovery group Return to previous living arrangement  PATIENT/FAMILY INVOLVEMENT: This treatment plan has been presented to and reviewed with the patient, Christopher Giles, and/or family member, .  The patient and family have been given the opportunity to ask questions and make suggestions.  Andrena Mewsuttall, Vann Okerlund J, RN 07/17/2016, 1:33 AM

## 2016-07-17 NOTE — Progress Notes (Signed)
[facility] MD Progress Note  07/17/2016 1:18 PM Christopher Giles  MRN:  [id number]   Subjective:  Patient reports he is depressed.  He is hopeful that the Zoloft and the Klonopin will be to effective this tme.  He was last here a month ago and he was concerned due to all the meds prescribed to him.   Objective: Christopher Giles is awake, alert and oriented 4. visible in group sessions. Denies suicidal or homicidal ideation. Denies auditory or visual hallucination and does not appear to be responding to internal stimuli.  Patient interacts well with staff and others. Patient reports he is medication compliant without mediation side effects.  Principal Problem: MDD (major depressive disorder), recurrent severe, without psychosis ([place]) Diagnosis:   Patient Active Problem List   Diagnosis Date Noted  . MDD (major depressive disorder), recurrent severe, without psychosis ([place]) [F33.2] 07/16/2016  . Moderate episode of recurrent major depressive disorder ([place]) [F33.1]   . Severe episode of recurrent major depressive disorder, without psychotic features ([place]) [F33.2]    Total Time spent with patient: 25 minutes   Past Medical History:  Past Medical History:  Diagnosis Date  . Anxiety   . Depression   . Substance abuse   . Suicide attempt    x 6 per pt  . Thyroid disease    hypo    Past Surgical History:  Procedure Laterality Date  . punctured lung     Family History:  Family History  Problem Relation Age of Onset  . Hyperlipidemia Mother   . Cancer Mother   . Cancer Father   . Diabetes Maternal Grandmother     Social History:  History  Alcohol Use No     History  Drug Use No    Social History   Social History  . Marital status: Single    Spouse name: N/A  . Number of children: N/A  . Years of education: N/A   Social History Main Topics  . Smoking status: Current Every Day Smoker    Packs/day: 1.00    Types: Cigarettes  . Smokeless tobacco: Never Used  . Alcohol use No   . Drug use: No  . Sexual activity: No   Other Topics Concern  . None   Social History Narrative  . None   Additional Social History:   Sleep: gradually improving   Appetite:  Improving   Current Medications: Current Facility-Administered Medications  Medication Dose Route Frequency Provider Last Rate Last Dose  . acetaminophen (TYLENOL) tablet 650 mg  650 mg Oral Q6H PRN Patrecia Pour, NP      . alum & mag hydroxide-simeth (MAALOX/MYLANTA) 200-200-20 MG/5ML suspension 30 mL  30 mL Oral Q4H PRN Patrecia Pour, NP      . clonazePAM Bobbye [place]) tablet 0.5 mg  0.5 mg Oral BID Linard Millers, MD   0.5 mg at 07/17/16 0839  . Influenza vac split quadrivalent PF (FLUARIX) injection 0.5 mL  0.5 mL Intramuscular Tomorrow-1000 Fernando A Cobos, MD      . levothyroxine (SYNTHROID, LEVOTHROID) tablet 75 mcg  75 mcg Oral QAC breakfast Patrecia Pour, NP   75 mcg at 07/17/16 4709  . magnesium hydroxide (MILK OF MAGNESIA) suspension 30 mL  30 mL Oral Daily PRN Patrecia Pour, NP      . sertraline (ZOLOFT) tablet 50 mg  50 mg Oral Daily Linard Millers, MD   50 mg at 07/17/16 0839  . tamsulosin (FLOMAX) capsule 0.4 mg  0.4 mg Oral QHS Patrecia Pour, NP   0.4 mg at 07/16/16 2309  . traZODone (DESYREL) tablet 25 mg  25 mg Oral QHS PRN Linard Millers, MD   25 mg at 07/16/16 2309    Lab Results:  Results for orders placed or performed during the hospital encounter of 07/15/16 (from the past 48 hour(s))  Urine rapid drug screen (hosp performed)not at [facility]     Status: None   Collection Time: 07/15/16  4:00 PM  Result Value Ref Range   Opiates NONE DETECTED NONE DETECTED   Cocaine NONE DETECTED NONE DETECTED   Benzodiazepines NONE DETECTED NONE DETECTED   Amphetamines NONE DETECTED NONE DETECTED   Tetrahydrocannabinol NONE DETECTED NONE DETECTED   Barbiturates NONE DETECTED NONE DETECTED    Comment:        DRUG SCREEN FOR MEDICAL PURPOSES ONLY.  IF CONFIRMATION IS NEEDED FOR  ANY PURPOSE, NOTIFY LAB WITHIN 5 DAYS.        LOWEST DETECTABLE LIMITS FOR URINE DRUG SCREEN Drug Class       Cutoff (ng/mL) Amphetamine      1000 Barbiturate      200 Benzodiazepine   353 Tricyclics       299 Opiates          300 Cocaine          300 THC              50   Comprehensive metabolic panel     Status: Abnormal   Collection Time: 07/15/16  4:32 PM  Result Value Ref Range   Sodium 136 135 - 145 mmol/L   Potassium 3.8 3.5 - 5.1 mmol/L   Chloride 101 101 - 111 mmol/L   CO2 26 22 - 32 mmol/L   Glucose, Bld 104 (H) 65 - 99 mg/dL   BUN 9 6 - 20 mg/dL   Creatinine, Ser 0.80 0.61 - 1.24 mg/dL   Calcium 9.4 8.9 - 10.3 mg/dL   Total Protein 7.7 6.5 - 8.1 g/dL   Albumin 4.6 3.5 - 5.0 g/dL   AST 16 15 - 41 U/L   ALT 22 17 - 63 U/L   Alkaline Phosphatase 73 38 - 126 U/L   Total Bilirubin 0.7 0.3 - 1.2 mg/dL   GFR calc non Af Amer >60 >60 mL/min   GFR calc Af Amer >60 >60 mL/min    Comment: (NOTE) The eGFR has been calculated using the CKD EPI equation. This calculation has not been validated in all clinical situations. eGFR's persistently <60 mL/min signify possible Chronic Kidney Disease.    Anion gap 9 5 - 15  Ethanol     Status: None   Collection Time: 07/15/16  4:32 PM  Result Value Ref Range   Alcohol, Ethyl (B) <5 <5 mg/dL    Comment:        LOWEST DETECTABLE LIMIT FOR SERUM ALCOHOL IS 5 mg/dL FOR MEDICAL PURPOSES ONLY   CBC with Diff     Status: None   Collection Time: 07/15/16  4:32 PM  Result Value Ref Range   WBC 9.1 4.0 - 10.5 K/uL   RBC 4.78 4.22 - 5.81 MIL/uL   Hemoglobin 15.1 13.0 - 17.0 g/dL   HCT 43.7 39.0 - 52.0 %   MCV 91.4 78.0 - 100.0 fL   MCH 31.6 26.0 - 34.0 pg   MCHC 34.6 30.0 - 36.0 g/dL   RDW 12.9 11.5 - 15.5 %   Platelets 266 150 -  400 K/uL   Neutrophils Relative % 76 %   Neutro Abs 6.9 1.7 - 7.7 K/uL   Lymphocytes Relative 16 %   Lymphs Abs 1.5 0.7 - 4.0 K/uL   Monocytes Relative 7 %   Monocytes Absolute 0.7 0.1 - 1.0 K/uL    Eosinophils Relative 1 %   Eosinophils Absolute 0.1 0.0 - 0.7 K/uL   Basophils Relative 0 %   Basophils Absolute 0.0 0.0 - 0.1 K/uL  Acetaminophen level     Status: Abnormal   Collection Time: 07/15/16  4:32 PM  Result Value Ref Range   Acetaminophen (Tylenol), Serum <10 (L) 10 - 30 ug/mL    Comment:        THERAPEUTIC CONCENTRATIONS VARY SIGNIFICANTLY. A RANGE OF 10-30 ug/mL MAY BE AN EFFECTIVE CONCENTRATION FOR MANY PATIENTS. HOWEVER, SOME ARE BEST TREATED AT CONCENTRATIONS OUTSIDE THIS RANGE. ACETAMINOPHEN CONCENTRATIONS >150 ug/mL AT 4 HOURS AFTER INGESTION AND >50 ug/mL AT 12 HOURS AFTER INGESTION ARE OFTEN ASSOCIATED WITH TOXIC REACTIONS.   Lithium level     Status: Abnormal   Collection Time: 07/15/16  4:32 PM  Result Value Ref Range   Lithium Lvl <0.06 (L) 0.60 - 1.20 mmol/L    Blood Alcohol level:  Lab Results  Component Value Date   ETH <5 07/15/2016   ETH <5 76/73/4193    Metabolic Disorder Labs: No results found for: HGBA1C, MPG No results found for: PROLACTIN No results found for: CHOL, TRIG, HDL, CHOLHDL, VLDL, LDLCALC  Physical Findings: AIMS: Facial and Oral Movements Muscles of Facial Expression: None, normal Lips and Perioral Area: None, normal Jaw: None, normal Tongue: None, normal,Extremity Movements Upper (arms, wrists, hands, fingers): None, normal Lower (legs, knees, ankles, toes): None, normal, Trunk Movements Neck, shoulders, hips: None, normal, Overall Severity Severity of abnormal movements (highest score from questions above): None, normal Incapacitation due to abnormal movements: None, normal Patient's awareness of abnormal movements (rate only patient's report): No Awareness, Dental Status Current problems with teeth and/or dentures?: No Does patient usually wear dentures?: No  CIWA:    COWS:  COWS Total Score: 0  Musculoskeletal: Strength & Muscle Tone: within normal limits Gait & Station: normal Patient leans:  N/A  Psychiatric Specialty Exam: Physical Exam  Nursing note and vitals reviewed. Constitutional: He is oriented to person, place, and time. He appears well-developed and well-nourished.  Cardiovascular: Regular rhythm.   Musculoskeletal: Normal range of motion.  Neurological: He is alert and oriented to person, place, and time.  Skin: Skin is warm and dry.  Psychiatric: He has a normal mood and affect. His speech is normal and behavior is normal. Cognition and memory are normal.    Review of Systems  Psychiatric/Behavioral: Positive for depression. Negative for hallucinations, substance abuse and suicidal ideas. The patient is nervous/anxious and has insomnia.   All other systems reviewed and are negative.  dizziness,  no chest pain or shortness of breath , no vomiting   Blood pressure 92/68, pulse 89, temperature 98.4 F (36.9 C), temperature source Oral, resp. rate 18, height '6\' 4"'  (1.93 m), weight 97.5 kg (215 lb), SpO2 97 %.Body mass index is 26.17 kg/m.  General Appearance: casual, fairly groomed  Eye Contact:  Good  Speech:  Normal Rate  Volume:  Normal  Mood:  Remains depressed but improving  Affect:  Remains constricted   Thought Process:  Linear  Orientation:  Other:  alert and attentive   Thought Content:  Denies hallucinations, no delusions expressed, not internally preoccupied, anxious  ruminations   Suicidal Thoughts:  No  Homicidal Thoughts:  No   Memory:  recent and remote grossly intact   Judgement:  improving   Insight:  Improving   Psychomotor Activity:restlessness   Concentration:  Concentration: Good and Attention Span: Good  Recall:  Good  Fund of Knowledge:  Good  Language:  Good  Akathisia:  Negative  Handed:  Right  AIMS (if indicated):     Assets:  Desire for Improvement Resilience  ADL's:  Intact  Cognition:  WNL  Sleep:  Number of Hours: 6   Treatment Plan Summary: MDD (major depressive disorder), recurrent severe, without psychosis  ([place]) unstable, managed as below: Medications:  -Cont Zoloft 50 mg depression -Continue   Klonopin 0.5 mg BID for acute anxiety -Trazodone 25 mg PRN sleep -Encourage group , milieu participation to work on Radiographer, therapeutic and symptom reduction  Treatment team working on disposition planning options   [place], NP [facility] 07/17/2016, 1:18 PM

## 2016-07-17 NOTE — BHH Group Notes (Signed)
Adult Group Therapy Note  Date:  07/17/2016  Time: 10:00AM-11:15AM  Group Topic/Focus: Today's group focused on the topic of fear and healthy coping skills.  An exercise was performed which elicited sources of fear that various patients feel, giving an opportunity for other patients to identify with that fear.  After a discussion of each, a coping skill was named that could be attempted in the future when such a fear arises.    Participation Level:  Active  Participation Quality:  Attentive and Sharing  Affect:  Blunted and Depressed  Cognitive:  Appropriate  Insight: Appropriate  Engagement in Group:  Engaged  Modes of Intervention:  Activity, Discussion and Support  Additional Comments:  The patient expressed that he fears abandonment, and feels his siblings are pulling away from him as his illness continues without resolution.  He expressed agreement with a number of other fears, and talked about the feelings of being helpless and hopeless and overwhelmed.  Carloyn JaegerMareida J Grossman-Orr 07/17/2016 , 1:16 PM

## 2016-07-17 NOTE — Progress Notes (Signed)
Pt attended the evening AA speaker meeting. Pt was engaged.  Morelia Cassells C, NT 07/17/16 8:28 PM  

## 2016-07-18 DIAGNOSIS — F332 Major depressive disorder, recurrent severe without psychotic features: Principal | ICD-10-CM

## 2016-07-18 DIAGNOSIS — Z833 Family history of diabetes mellitus: Secondary | ICD-10-CM

## 2016-07-18 DIAGNOSIS — Z79899 Other long term (current) drug therapy: Secondary | ICD-10-CM

## 2016-07-18 DIAGNOSIS — Z8349 Family history of other endocrine, nutritional and metabolic diseases: Secondary | ICD-10-CM

## 2016-07-18 DIAGNOSIS — Z808 Family history of malignant neoplasm of other organs or systems: Secondary | ICD-10-CM

## 2016-07-18 DIAGNOSIS — F17210 Nicotine dependence, cigarettes, uncomplicated: Secondary | ICD-10-CM

## 2016-07-18 MED ORDER — NICOTINE 21 MG/24HR TD PT24
21.0000 mg | MEDICATED_PATCH | Freq: Every day | TRANSDERMAL | Status: DC
  Administered 2016-07-18 – 2016-07-28 (×11): 21 mg via TRANSDERMAL
  Filled 2016-07-18 (×12): qty 1

## 2016-07-18 NOTE — Progress Notes (Signed)
[facility] MD Progress Note  07/18/2016 6:05 PM Christopher Giles  MRN:  [id number]   Subjective:  "I feel much better today. I feel better than I expected. I also slept very well and I'm going to groups"  Objective: Pt seen and chart reviewed. Pt is alert/oriented x4, calm, cooperative, and appropriate to situation. Pt denies suicidal/homicidal ideation and psychosis and does not appear to be responding to internal stimuli. Pt reports great improvement in symptoms and being well-rested last night. He is enthusiastic about his care and presents as much more lucid and optimistic than I have seen him in past encounters.   Principal Problem: MDD (major depressive disorder), recurrent severe, without psychosis (HCC) Diagnosis:   Patient Active Problem List   Diagnosis Date Noted  . Severe episode of recurrent major depressive disorder, without psychotic features (HCC) [F33.2]     Priority: High  . MDD (major depressive disorder), recurrent severe, without psychosis (HCC) [F33.2] 07/16/2016  . Moderate episode of recurrent major depressive disorder (HCC) [F33.1]    Total Time spent with patient: 25 minutes   Past Medical History:  Past Medical History:  Diagnosis Date  . Anxiety   . Depression   . Substance abuse   . Suicide attempt    x 6 per pt  . Thyroid disease    hypo    Past Surgical History:  Procedure Laterality Date  . punctured lung     Family History:  Family History  Problem Relation Age of Onset  . Hyperlipidemia Mother   . Cancer Mother   . Cancer Father   . Diabetes Maternal Grandmother     Social History:  History  Alcohol Use No     History  Drug Use No    Social History   Social History  . Marital status: Single    Spouse name: N/A  . Number of children: N/A  . Years of education: N/A   Social History Main Topics  . Smoking status: Current Every Day Smoker    Packs/day: 1.00    Types: Cigarettes  . Smokeless tobacco: Never Used  . Alcohol use No  .  Drug use: No  . Sexual activity: No   Other Topics Concern  . None   Social History Narrative  . None   Additional Social History:   Sleep: gradually improving   Appetite:  Improving   Current Medications: Current Facility-Administered Medications  Medication Dose Route Frequency Provider Last Rate Last Dose  . acetaminophen (TYLENOL) tablet 650 mg  650 mg Oral Q6H PRN Charm Rings, NP      . alum & mag hydroxide-simeth (MAALOX/MYLANTA) 200-200-20 MG/5ML suspension 30 mL  30 mL Oral Q4H PRN Charm Rings, NP   30 mL at 07/17/16 1656  . clonazePAM (KLONOPIN) tablet 0.5 mg  0.5 mg Oral BID Acquanetta Sit, MD   0.5 mg at 07/18/16 1727  . levothyroxine (SYNTHROID, LEVOTHROID) tablet 75 mcg  75 mcg Oral QAC breakfast Charm Rings, NP   75 mcg at 07/18/16 [phone]  . magnesium hydroxide (MILK OF MAGNESIA) suspension 30 mL  30 mL Oral Daily PRN Charm Rings, NP      . nicotine (NICODERM CQ - dosed in mg/24 hours) patch 21 mg  21 mg Transdermal Q0600 Acquanetta Sit, MD   21 mg at 07/18/16 0845  . sertraline (ZOLOFT) tablet 50 mg  50 mg Oral Daily Acquanetta Sit, MD   50 mg at 07/18/16 [phone]  .  tamsulosin (FLOMAX) capsule 0.4 mg  0.4 mg Oral QHS Charm RingsJamison Y Lord, NP   0.4 mg at 07/17/16 2311  . traZODone (DESYREL) tablet 25 mg  25 mg Oral QHS PRN Acquanetta SitElizabeth Woods Oates, MD   25 mg at 07/17/16 2311    Lab Results:  No results found for this or any previous visit (from the past 48 hour(s)).  Blood Alcohol level:  Lab Results  Component Value Date   ETH <5 07/15/2016   ETH <5 07/09/2016    Metabolic Disorder Labs: No results found for: HGBA1C, MPG No results found for: PROLACTIN No results found for: CHOL, TRIG, HDL, CHOLHDL, VLDL, LDLCALC  Physical Findings: AIMS: Facial and Oral Movements Muscles of Facial Expression: None, normal Lips and Perioral Area: None, normal Jaw: None, normal Tongue: None, normal,Extremity Movements Upper (arms, wrists, hands,  fingers): None, normal Lower (legs, knees, ankles, toes): None, normal, Trunk Movements Neck, shoulders, hips: None, normal, Overall Severity Severity of abnormal movements (highest score from questions above): None, normal Incapacitation due to abnormal movements: None, normal Patient's awareness of abnormal movements (rate only patient's report): No Awareness, Dental Status Current problems with teeth and/or dentures?: No Does patient usually wear dentures?: No  CIWA:    COWS:  COWS Total Score: 0  Musculoskeletal: Strength & Muscle Tone: within normal limits Gait & Station: normal Patient leans: N/A  Psychiatric Specialty Exam: Physical Exam  Nursing note and vitals reviewed. Constitutional: He is oriented to person, place, and time. He appears well-developed and well-nourished.  Cardiovascular: Regular rhythm.   Musculoskeletal: Normal range of motion.  Neurological: He is alert and oriented to person, place, and time.  Skin: Skin is warm and dry.  Psychiatric: He has a normal mood and affect. His speech is normal and behavior is normal. Cognition and memory are normal.    Review of Systems  Psychiatric/Behavioral: Positive for depression. Negative for hallucinations, substance abuse and suicidal ideas. The patient is nervous/anxious and has insomnia.   All other systems reviewed and are negative.  dizziness,  no chest pain or shortness of breath , no vomiting   Blood pressure (!) 88/63, pulse 87, temperature 97.3 F (36.3 C), resp. rate 18, height 6\' 4"  (1.93 m), weight 97.5 kg (215 lb), SpO2 97 %.Body mass index is 26.17 kg/m.  General Appearance: casual, fairly groomed  Eye Contact:  Good  Speech:  Normal Rate clear, coherent  Volume:  Normal  Mood:  Remains depressed but greatly improving  Affect:  Remains constricted   Thought Process:  Linear  Orientation:  Other:  alert and attentive   Thought Content:  Denies hallucinations, no delusions expressed, not internally  preoccupied, anxious ruminations   Suicidal Thoughts:  No  Homicidal Thoughts:  No   Memory:  recent and remote grossly intact   Judgement:  improving   Insight:  Improving   Psychomotor Activity:restlessness   Concentration:  Concentration: Good and Attention Span: Good  Recall:  Good  Fund of Knowledge:  Good  Language:  Good  Akathisia:  Negative  Handed:  Right  AIMS (if indicated):     Assets:  Desire for Improvement Resilience  ADL's:  Intact  Cognition:  WNL  Sleep:  Number of Hours: 7.25   Treatment Plan Summary: MDD (major depressive disorder), recurrent severe, without psychosis (HCC) unstable, managed as below: Medications:  -Cont Zoloft 50 mg depression -Continue   Klonopin 0.5 mg BID for acute anxiety -Trazodone 25 mg PRN sleep -Flomax 0.4mg  for BPH -Synthroid  75mcg daily -Encourage group , milieu participation to work on Pharmacologistcoping skills and symptom reduction  Treatment team working on disposition planning options   Beau FannyWithrow, John C, [name] [facility] 07/18/2016, 6:05 PM

## 2016-07-18 NOTE — [facility] Group Notes (Signed)
[facility] Group Notes: (Clinical Social Work)   07/18/2016      Type of Therapy:  Group Therapy   Participation Level:  Did Not Attend despite MHT prompting   Kaniyah Lisby Grossman-Orr, LCSW 07/18/2016, 12:28 PM     

## 2016-07-18 NOTE — Progress Notes (Signed)
Pt attended the evening AA speaker meeting. Pt was engaged and appropriate.  Tyner Codner C, NT 07/18/16 9:08 PM 

## 2016-07-19 MED ORDER — SERTRALINE HCL 100 MG PO TABS
100.0000 mg | ORAL_TABLET | Freq: Every day | ORAL | Status: DC
  Administered 2016-07-20 – 2016-07-28 (×9): 100 mg via ORAL
  Filled 2016-07-19 (×9): qty 1
  Filled 2016-07-19: qty 7
  Filled 2016-07-19: qty 1

## 2016-07-19 MED ORDER — INFLUENZA VAC SPLIT QUAD 0.5 ML IM SUSY: 0.5000 mL | PREFILLED_SYRINGE | Freq: Once | INTRAMUSCULAR | Status: DC

## 2016-07-19 MED ORDER — PANTOPRAZOLE SODIUM 20 MG PO TBEC
20.0000 mg | DELAYED_RELEASE_TABLET | Freq: Every day | ORAL | Status: DC
  Administered 2016-07-20 – 2016-07-28 (×9): 20 mg via ORAL
  Filled 2016-07-19 (×10): qty 1
  Filled 2016-07-19: qty 7
  Filled 2016-07-19: qty 1

## 2016-07-19 NOTE — Progress Notes (Signed)
Nursing Note 07/19/2016 [id number]  Data Reports sleeping good without PRN sleep med.  Rates depression 8/10, hopelessness 8/10, and anxiety 8/10. Affect depressed.  Denies HI, SI, AVH.  Slept in during AM. Reports ongoing depression.  Action Spoke with patient 1:1, nurse offered support to patient throughout shift.  Spoke with patient regarding staying in room, goal set to get up in afternoon.  Encouraged patient to take a shower today.  Continues to be monitored on 15 minute checks for safety.  Response Patient declined taking a shower, but did get up in afternoon and start attending groups.  Somewhat isolative but improved as day went on.

## 2016-07-19 NOTE — Progress Notes (Signed)
Patient attended AA group meeting tonight.  

## 2016-07-19 NOTE — Progress Notes (Signed)
[facility]  07/19/2016 2:19 PM  Patient Active Problem List   Diagnosis Date Noted  . MDD (major depressive disorder), recurrent severe, without psychosis (HCC) 07/16/2016  . Moderate episode of recurrent major depressive disorder (HCC)   . Severe episode of recurrent major depressive disorder, without psychotic features (HCC)     Diagnosis: Major depressive disorder, recurrent  Subjective: Patient denies current suicidal or homicidal ideation, plan or intent. He describes his mood as still being somewhat down. He denies any side effects from the medication except that he notes that his throat is dry and he is constipated.  Objective: Well-developed well-nourished slightly disheveled male wearing hospital paper scrubs he is pleasant and appropriate mood is "down" affect is mildly anxious thought processes linear and goal-directed thought content no current suicidal or homicidal ideation, plan or intent, alert and oriented 3, IQ appears an average range insight and judgment are fair   Current Facility-Administered Medications (Endocrine & Metabolic):  .  levothyroxine (SYNTHROID, LEVOTHROID) tablet 75 mcg       Current Facility-Administered Medications (Analgesics):  .  acetaminophen (TYLENOL) tablet 650 mg     Current Facility-Administered Medications (Other):  .  alum & mag hydroxide-simeth (MAALOX/MYLANTA) 200-200-20 MG/5ML suspension 30 mL .  clonazePAM (KLONOPIN) tablet 0.5 mg .  magnesium hydroxide (MILK OF MAGNESIA) suspension 30 mL .  nicotine (NICODERM CQ - dosed in mg/24 hours) patch 21 mg .  sertraline (ZOLOFT) tablet 50 mg .  tamsulosin (FLOMAX) capsule 0.4 mg .  traZODone (DESYREL) tablet 25 mg  No current outpatient prescriptions on file.  Vital Signs:Blood pressure 94/64, pulse 96, temperature 98 F (36.7 C), temperature source Oral, resp. rate 16, height 6\' 4"  (1.93 m), weight 97.5 kg (215 lb), SpO2 97 %.    Lab Results: No results found for  this or any previous visit (from the past 48 hour(s)).  Physical Findings: AIMS: Facial and Oral Movements Muscles of Facial Expression: None, normal Lips and Perioral Area: None, normal Jaw: None, normal Tongue: None, normal,Extremity Movements Upper (arms, wrists, hands, fingers): None, normal Lower (legs, knees, ankles, toes): None, normal, Trunk Movements Neck, shoulders, hips: None, normal, Overall Severity Severity of abnormal movements (highest score from questions above): None, normal Incapacitation due to abnormal movements: None, normal Patient's awareness of abnormal movements (rate only patient's report): No Awareness, Dental Status Current problems with teeth and/or dentures?: No Does patient usually wear dentures?: No  CIWA:    COWS:  COWS Total Score: 0   Assessment/Plan: Patient complains of some symptoms consistent with dehydration and blood pressure has been a bit low this morning he was encouraged to drink fluids, and tomorrow if he appears better hydrated we will increase the Zoloft 200 mg by mouth every morning. Patient had previously been on 150 mg by mouth every morning. He reported a good result at that dose. We also discussed working now on changes he needs to make when discharged in order to continue with his depression recovery.  Acquanetta SitElizabeth Woods Yuri Fana, MD 07/19/2016, 2:19 PM

## 2016-07-19 NOTE — [facility] Group Notes (Signed)
[facility] LCSW Group Therapy 07/19/2016  1:15 pm  Type of Therapy: Group Therapy Participation Level: Active  Participation Quality: Attentive, Sharing and Supportive  Affect: Blunted  Cognitive: Alert and Oriented  Insight: Developing/Improving and Engaged  Engagement in Therapy: Developing/Improving and Engaged  Modes of Intervention: Clarification, Confrontation, Discussion, Education, Exploration,  Limit-setting, Orientation, Problem-solving, Rapport Building, Dance movement psychotherapisteality Testing, Socialization and Support  Summary of Progress/Problems: Pt identified obstacles faced currently and processed barriers involved in overcoming these obstacles. Pt identified steps necessary for overcoming these obstacles and explored motivation (internal and external) for facing these difficulties head on. Pt further identified one area of concern in their lives and chose a goal to focus on for today. Patient participated in life timeline activity. 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 He shared about challenges of addiction and homelessness in the past.   Christopher BruinKristin Skye Rodarte, LCSW Clinical Social Worker [facility] [phone]

## 2016-07-19 NOTE — Progress Notes (Signed)
Recreation Therapy Notes  Date: 07/19/16 Time: 0930 Location: [address] Group   Group Topic: Stress Management  Goal Area(s) Addresses:  Patient will verbalize importance of using healthy stress management.  Patient will identify positive emotions associated with healthy stress management.   Intervention: Stress Management  Activity :  Imagery to Connect with Feelings.  LRT introduced the stress management technique of guided imagery.  LRT read a script to allow the patients to examine and connect with their inner feelings.  Patients were to follow along and engaged as LRT read script.  Education:  Stress Management, Discharge Planning.   Education Outcome: Acknowledges edcuation/In group clarification offered/Needs additional education  Clinical Observations/Feedback: Pt did not attend group.     Caroll RancherMarjette Shawne Bulow, LRT/CTRS         Caroll RancherLindsay, Leiana Rund A 07/19/2016 12:23 PM

## 2016-07-19 NOTE — Progress Notes (Signed)
D    Pt is pleasant on approach and cooperative    He is a bit confused and hyperverbal with poor memory and disorganized thinking   He wanted to discuss his medications but was having a difficult time articulating due to his disorganization    He interacts appropriately with others and attends groups A    Verbal support given   Medications administered and effectiveness monitored    Q 15 min checks R   Pt is safe at present and receptive to verbal support 

## 2016-07-19 NOTE — Progress Notes (Signed)
Patient came to nurse complaining of burning sensation in throat to epigastric area x1 week.  Reports history of vomiting x24 hours 10/26.  States discomfort has been intermittent for past week, and that he had regurgitation episode where he vomited small amount.  Given Maalox for discomfort.  NP contacted, received order for Protonix to begin in AM.  Patient educated and verbalized understanding.

## 2016-07-19 NOTE — Tx Team (Signed)
Interdisciplinary Treatment and Diagnostic Plan Update  07/19/2016 Time of Session: 9:30am Christopher Giles MRN: [id number]  Principal Diagnosis: MDD (major depressive disorder), recurrent severe, without psychosis ([place])  Secondary Diagnoses: Principal Problem:   MDD (major depressive disorder), recurrent severe, without psychosis ([place])   Current Medications:  Current Facility-Administered Medications  Medication Dose Route Frequency Provider Last Rate Last Dose  . acetaminophen (TYLENOL) tablet 650 mg  650 mg Oral Q6H PRN Patrecia Pour, NP      . alum & mag hydroxide-simeth (MAALOX/MYLANTA) 200-200-20 MG/5ML suspension 30 mL  30 mL Oral Q4H PRN Patrecia Pour, NP   30 mL at 07/17/16 1656  . clonazePAM (KLONOPIN) tablet 0.5 mg  0.5 mg Oral BID Linard Millers, MD   0.5 mg at 07/19/16 0910  . levothyroxine (SYNTHROID, LEVOTHROID) tablet 75 mcg  75 mcg Oral QAC breakfast Patrecia Pour, NP   75 mcg at 07/19/16 7782  . magnesium hydroxide (MILK OF MAGNESIA) suspension 30 mL  30 mL Oral Daily PRN Patrecia Pour, NP      . nicotine (NICODERM CQ - dosed in mg/24 hours) patch 21 mg  21 mg Transdermal Q0600 Linard Millers, MD   21 mg at 07/19/16 0912  . sertraline (ZOLOFT) tablet 50 mg  50 mg Oral Daily Linard Millers, MD   50 mg at 07/19/16 0911  . tamsulosin (FLOMAX) capsule 0.4 mg  0.4 mg Oral QHS Patrecia Pour, NP   0.4 mg at 07/18/16 2303  . traZODone (DESYREL) tablet 25 mg  25 mg Oral QHS PRN Linard Millers, MD   25 mg at 07/18/16 2303   PTA Medications: Prescriptions Prior to Admission  Medication Sig Dispense Refill Last Dose  . buPROPion (WELLBUTRIN XL) 150 MG 24 hr tablet Take 1 tablet (150 mg total) by mouth daily. 30 tablet 0 07/14/2016 at Unknown time  . cholecalciferol (VITAMIN D) 1000 units tablet Take 1 tablet (1,000 Units total) by mouth daily. 30 tablet 0 07/14/2016 at Unknown time  . gabapentin (NEURONTIN) 300 MG capsule Take 2 capsules (600 mg total)  by mouth 3 (three) times daily. 60 capsule 0 07/14/2016 at Unknown time  . hydrOXYzine (ATARAX/VISTARIL) 25 MG tablet Take 1 tablet (25 mg total) by mouth every 6 (six) hours as needed for anxiety. 30 tablet 0 07/14/2016 at Unknown time  . levothyroxine (SYNTHROID, LEVOTHROID) 75 MCG tablet Take 1 tablet (75 mcg total) by mouth daily before breakfast. 30 tablet 0 07/15/2016 at Unknown time  . lithium carbonate 150 MG capsule Take 3 capsules (450 mg total) by mouth at bedtime. 45 capsule 0 Past Week at Unknown time  . mirtazapine (REMERON) 30 MG tablet Take 1 tablet (30 mg total) by mouth at bedtime. 30 tablet 0 Past Week at Unknown time  . tamsulosin (FLOMAX) 0.4 MG CAPS capsule Take 1 capsule (0.4 mg total) by mouth at bedtime. 30 capsule 0 Past Week at Unknown time    Patient Stressors:    Patient Strengths: Average or above average intelligence General fund of knowledge Motivation for treatment/growth  Treatment Modalities: Medication Management, Group therapy, Case management,  1 to 1 session with clinician, Psychoeducation, Recreational therapy.   Physician Treatment Plan for Primary Diagnosis: MDD (major depressive disorder), recurrent severe, without psychosis ([place]) Long Term Goal(s): Improvement in symptoms so as ready for discharge Improvement in symptoms so as ready for discharge   Short Term Goals: Ability to disclose and discuss suicidal ideas Ability to demonstrate  self-control will improve Ability to identify changes in lifestyle to reduce recurrence of condition will improve  Medication Management: Evaluate patient's response, side effects, and tolerance of medication regimen.  Therapeutic Interventions: 1 to 1 sessions, Unit Group sessions and Medication administration.  Evaluation of Outcomes: Not Met  Physician Treatment Plan for Secondary Diagnosis: Principal Problem:   MDD (major depressive disorder), recurrent severe, without psychosis ([place])  Long Term Goal(s):  Improvement in symptoms so as ready for discharge Improvement in symptoms so as ready for discharge   Short Term Goals: Ability to disclose and discuss suicidal ideas Ability to demonstrate self-control will improve Ability to identify changes in lifestyle to reduce recurrence of condition will improve     Medication Management: Evaluate patient's response, side effects, and tolerance of medication regimen.  Therapeutic Interventions: 1 to 1 sessions, Unit Group sessions and Medication administration.  Evaluation of Outcomes: Not Met   RN Treatment Plan for Primary Diagnosis: MDD (major depressive disorder), recurrent severe, without psychosis ([place]) Long Term Goal(s): Knowledge of disease and therapeutic regimen to maintain health will improve  Short Term Goals: Ability to remain free from injury will improve, Ability to disclose and discuss suicidal ideas, Ability to identify and develop effective coping behaviors will improve and Compliance with prescribed medications will improve  Medication Management: RN will administer medications as ordered by provider, will assess and evaluate patient's response and provide education to patient for prescribed medication. RN will report any adverse and/or side effects to prescribing provider.  Therapeutic Interventions: 1 on 1 counseling sessions, Psychoeducation, Medication administration, Evaluate responses to treatment, Monitor vital signs and CBGs as ordered, Perform/monitor CIWA, COWS, AIMS and Fall Risk screenings as ordered, Perform wound care treatments as ordered.  Evaluation of Outcomes: Not Met   LCSW Treatment Plan for Primary Diagnosis: MDD (major depressive disorder), recurrent severe, without psychosis ([place]) Long Term Goal(s): Safe transition to appropriate next level of care at discharge, Engage patient in therapeutic group addressing interpersonal concerns.  Short Term Goals: Engage patient in aftercare planning with referrals and  resources, Increase social support, Increase emotional regulation, Facilitate acceptance of mental health diagnosis and concerns, Identify triggers associated with mental health/substance abuse issues and Increase skills for wellness and recovery  Therapeutic Interventions: Assess for all discharge needs, 1 to 1 time with Social worker, Explore available resources and support systems, Assess for adequacy in community support network, Educate family and significant other(s) on suicide prevention, Complete Psychosocial Assessment, Interpersonal group therapy.  Evaluation of Outcomes: Not Met   Progress in Treatment :  Attending groups: Yes  Participating in groups: Yes  Taking medication as prescribed: Yes, MD continuing to assess for appropriate medication regimen  Toleration medication: Yes  Family/Significant other contact made: CSW assessing for appropriate contacts  Patient understands diagnosis: Yes  Discussing patient identified problems/goals with staff: Yes  Medical problems stabilized or resolved: Yes  Denies suicidal/homicidal ideation: Treatment team continuing to asses  Issues/concerns per patient self-inventory: None reported  Other: N/A  New problem(s) identified: None reported at this time    New Short Term/Long Term Goal(s): None at this time    Discharge Plan or Barriers:     Reason for Continuation of Hospitalization: Anxiety Depression Medication stabilization Withdrawal symptoms  Estimated Length of Stay: 3-5 days    Attendees:  Patient:   Physician: Dr. Jeanene Erb , MD  07/19/2016   9:30am  Nursing: Otilio Carpen, Lawerance Sabal., RN 07/19/2016 9:30am  RN Care Manager: Lars Pinks, CM  07/19/2016 9:30am  Social Workers: Peri Maris, LCSW, Erasmo Downer [place], LCSW, [place], LCSW   07/19/2016 9:30am  Nurse Pratictioners: Samuel Jester, NP, Ricky Ala, NP 07/19/2016 9:30am  Other:     Scribe for Treatment  Team: Tilden Fossa, [place] Worker [facility] [phone]

## 2016-07-20 NOTE — [facility] Group Notes (Signed)
[facility] LCSW Group Therapy 07/20/2016 1:15 PM Type of Therapy: Group Therapy Participation Level: Active  Participation Quality: Attentive  Affect: Appropriate  Cognitive: Alert and Oriented  Insight: Developing/Improving and Engaged  Engagement in Therapy: Developing/Improving and Engaged  Modes of Intervention: Activity, Clarification, Confrontation, Discussion, Education, Exploration, Limit-setting, Orientation, Problem-solving, Rapport Building, Reality Testing, Socialization and Support  Summary of Progress/Problems: Patient was attentive and engaged with speaker from [employer]. Patient was attentive to speaker while they shared their story of dealing with mental health and overcoming it. Patient expressed interest in their programs and services and received information on their agency. Patient processed ways they can relate to the speaker.   Lashuna Tamashiro, LCSW Clinical Social Worker [facility] [phone]   

## 2016-07-20 NOTE — Progress Notes (Signed)
[facility]  07/20/2016 1:53 PM  Patient Active Problem List   Diagnosis Date Noted  . MDD (major depressive disorder), recurrent severe, without psychosis (HCC) 07/16/2016  . Moderate episode of recurrent major depressive disorder (HCC)   . Severe episode of recurrent major depressive disorder, without psychotic features (HCC)     Diagnosis: Major depressive disorder, severe  Subjective: Patient relates he had an episode of feeling dizzy after he stood up about 2 hours after he took his first dose of 100 mg of Zoloft but otherwise no side effects so far. Denies acute suicidal or homicidal ideation, plan or intent. We again engaged in some problem solving about how he can avoid returning to his apartment and immediately isolating and becoming more depressed and trapped feeling. Patient agrees it would be a good idea to begin immediately with cleaning or other activities and attending meetings with his AA group. He has reached out to his sister and appears their communication has improved.  Objective: Well-developed well-nourished man in no apparent distress pleasant and appropriate, casually dressed in paper hospital scrubs and mildly unkempt, mood is described as all right and affect is unremarkable thought processes linear and goal-directed thought content denies current suicidal ideation, plan or intent, alert and oriented 3 IQ appears an average range insight and judgment are fair  TSH has been checked and is within normal limits 06/12/16   Current Facility-Administered Medications (Endocrine & Metabolic):  .  levothyroxine (SYNTHROID, LEVOTHROID) tablet 75 mcg       Current Facility-Administered Medications (Analgesics):  .  acetaminophen (TYLENOL) tablet 650 mg     Current Facility-Administered Medications (Other):  .  alum & mag hydroxide-simeth (MAALOX/MYLANTA) 200-200-20 MG/5ML suspension 30 mL .  clonazePAM (KLONOPIN) tablet 0.5 mg .  magnesium hydroxide (MILK  OF MAGNESIA) suspension 30 mL .  nicotine (NICODERM CQ - dosed in mg/24 hours) patch 21 mg .  pantoprazole (PROTONIX) EC tablet 20 mg .  sertraline (ZOLOFT) tablet 100 mg .  tamsulosin (FLOMAX) capsule 0.4 mg .  traZODone (DESYREL) tablet 25 mg  No current outpatient prescriptions on file.  Vital Signs:Blood pressure 97/61, pulse 96, temperature 97.6 F (36.4 C), temperature source Oral, resp. rate 16, height 6\' 4"  (1.93 m), weight 97.5 kg (215 lb), SpO2 97 %.    Lab Results: No results found for this or any previous visit (from the past 48 hour(s)).  Physical Findings: AIMS: Facial and Oral Movements Muscles of Facial Expression: None, normal Lips and Perioral Area: None, normal Jaw: None, normal Tongue: None, normal,Extremity Movements Upper (arms, wrists, hands, fingers): None, normal Lower (legs, knees, ankles, toes): None, normal, Trunk Movements Neck, shoulders, hips: None, normal, Overall Severity Severity of abnormal movements (highest score from questions above): None, normal Incapacitation due to abnormal movements: None, normal Patient's awareness of abnormal movements (rate only patient's report): No Awareness, Dental Status Current problems with teeth and/or dentures?: No Does patient usually wear dentures?: No  CIWA:    COWS:  COWS Total Score: 0   Assessment/Plan: Patient is tolerating the Zoloft fairly well. He reports one episode of feeling lightheaded or dizzy today at 2 hours after taking the increased dose. We will continue to monitor. He appears to be making some progress in addressing issues of returning to the same environment and feeling trapped, isolated and lonely which derailed his recovery after being discharged from the hospital last time.  Christopher SitElizabeth Giles Christopher Luna, MD 07/20/2016, 1:53 PM

## 2016-07-20 NOTE — Progress Notes (Signed)
Patient ID: Christopher Giles, male   DOB: 1966-05-31, 50 y.o.   MRN: [id number] D: Client is visible on the unit, seen interacting with peers, playing cards in dayroom and watching TV. Client reports goal for today "stay positive, get up more today" "felt like I did that" "remained positive by talking to myself" "I'm kind of in that roller coaster phase, since they stopped all my medications" Client reports depression "5" of 10. A: Writer provided emotional support, reviewed medications, administered as ordered. Client encouraged to report any concerns. Staff will monitor q8115min for safety. R:Clietn is safe on the unit, attended group.

## 2016-07-20 NOTE — [facility] Group Notes (Signed)
[facility] Group Notes: (Nursing/MHT/Case Management/Adjunct)  Date: 07/20/2016 Time: 10:47 AM  Type of Therapy: Psychoeducational Skills  Participation Level: Did Not Attend  Participation Quality: Did not attend   Affect:Did not attend   Cognitive:Did not attend'  Insight: None  Engagement in Group: None  Modes of Intervention:Discussion and Education  Summary of Progress/Problems: Patient did not attend group.  

## 2016-07-20 NOTE — Progress Notes (Signed)
D: Pt is alert and oriented x 4. Pt endorses moderate anxiety and depression; states, "I don't think I got the right medications the last time I was here; there were so many doctors coming to see me that I got confused." Pt denies SI, HI, pain or AVH. Pt continues to blame self and doctor for returning after just a few days of being d/c. Pt remained flat and withdrawn to self. A: Medications offered as prescribed.  Support, encouragement, and safe environment provided.  15-minute safety checks continue. R: Pt was med compliant.  Pt attended AA group. Safety checks continue.

## 2016-07-20 NOTE — Progress Notes (Signed)
Recreation Therapy Notes  Animal-Assisted Activity (AAA) Program Checklist/Progress Notes Patient Eligibility Criteria Checklist & Daily Group note for Rec TxIntervention  Date: 11.07.2017 Time: 2:45pm [address]    AAA/T Program Assumption of Risk Form signed by Patient/ or Parent Legal Guardian Yes  Patient is free of allergies or sever asthma Yes  Patient reports no fear of animals Yes  Patient reports no history of cruelty to animals Yes  Patient understands his/her participation is voluntary Yes  Behavioral Response: Did not attend.   Marykay Lexenise L Curtez Brallier, LRT/CTRS       Ji Feldner L 07/20/2016 2:58 PM

## 2016-07-21 NOTE — Progress Notes (Signed)
Patient attended AA group meeting.  

## 2016-07-21 NOTE — Progress Notes (Signed)
Adult Psychoeducational Group Note  Date:  07/21/2016 Time:  1:23 AM  Group Topic/Focus:  Wrap-Up Group:   The focus of this group is to help patients review their daily goal of treatment and discuss progress on daily workbooks.   Participation Level:  Active  Participation Quality:  Appropriate  Affect:  Appropriate  Cognitive:  Alert  Insight: Appropriate  Engagement in Group:  Engaged  Modes of Intervention:  Discussion  Additional Comments:  Patient states, "I had an okay day". On a scale between 1-10, (1=worse, 10=best) patient rated his day a 5. Aaima Gaddie L Avalie Oconnor 07/21/2016, 1:23 AM

## 2016-07-21 NOTE — Progress Notes (Signed)
Patient ID: Christopher BanningJames C Giles, male   DOB: [birth year], 50 y.o.   MRN: [id number] D: client visible on unit, reports of his day "it's been good" depression "5" of 10, "I have more trouble with depression in the morning" A: Writer encouraged client to spend more time out of room. Medications reviewed, administered as ordered. Client encouraged to report any concerns. Staff will monitor q6415min for safety. R: client is safe on the unit, attended group.

## 2016-07-21 NOTE — Progress Notes (Signed)
Recreation Therapy Notes  Date:  07/21/16 Time: 0930 Location: [address] Dayroom  Group Topic: Stress Management  Goal Area(s) Addresses:  Patient will verbalize importance of using healthy stress management.  Patient will identify positive emotions associated with healthy stress management.   Intervention: Calm App  Activity :  Forgiveness of Self Imagery.  LRT introduced the concept of guided imagery.  LRT played an imagery meditation from the Calm app so patients could engage and participate in the activity.  Patients were to follow along with the recording to participate in the activity.    Education:  Stress Management, Discharge Planning.   Education Outcome: Acknowledges edcuation/In group clarification offered/Needs additional education  Clinical Observations/Feedback: Pt did not attend group.     Caroll RancherMarjette Giannamarie Paulus, LRT/CTRS         Caroll RancherLindsay, Corbyn Wildey A 07/21/2016 11:56 AM

## 2016-07-21 NOTE — Progress Notes (Signed)
D:  Patient awake and alert; oriented x 4; he denies suicidal and homicidal ideation and AVH; no self-injurious behaviors noted or reported. A:  Medications given as scheduled;  Emotional support provided; encouraged him to seek assistance with needs/concerns. R:  Safety maintained on unit. 

## 2016-07-21 NOTE — [facility] Group Notes (Signed)
[facility] LCSW Group Therapy 07/21/2016  1:15 PM Type of Therapy: Group Therapy Participation Level: Active  Participation Quality: Attentive, Sharing and Supportive  Affect: Appropriate  Cognitive: Alert and Oriented  Insight: Developing/Improving and Engaged  Engagement in Therapy: Developing/Improving and Engaged  Modes of Intervention: Clarification, Confrontation, Discussion, Education, Exploration, Limit-setting, Orientation, Problem-solving, Rapport Building, Dance movement psychotherapisteality Testing, Socialization and Support  Summary of Progress/Problems: The topic for group today was emotional regulation. This group focused on both positive and negative emotion identification and allowed group members to process ways to identify feelings, regulate negative emotions, and find healthy ways to manage internal/external emotions. Group members were asked to reflect on a time when their reaction to an emotion led to a negative outcome and explored how alternative responses using emotion regulation would have benefited them. Group members were also asked to discuss a time when emotion regulation was utilized when a negative emotion was experienced. Patient discussed the importance of having a routine and purpose in life. He shared that he struggles with loneliness. He shared that he is interested in getting involved in community support groups when he returns home.  Samuella BruinKristin Makai Agostinelli, MSW, LCSW Clinical Social Worker [facility] [phone]

## 2016-07-21 NOTE — Progress Notes (Addendum)
[facility]  07/21/2016 1:48 PM  Patient Active Problem List   Diagnosis Date Noted  . MDD (major depressive disorder), recurrent severe, without psychosis (HCC) 07/16/2016  . Moderate episode of recurrent major depressive disorder (HCC)   . Severe episode of recurrent major depressive disorder, without psychotic features (HCC)     Diagnosis: Major depressive disorder  Subjective: Patient reports mood is slightly improved although he still has trouble getting himself motivated to get out of bed in the morning. He has been talking with his sister and it has gone well. We discussed whether or not to raise the Zoloft at this time and we will leave it at 100 mg daily as patient is still making progress and take time to see if this dose is adequate. Patient and I discussed probable discharge next week and strategies to avoid getting too isolated in his apartment and decompensating.  Objective: Well-developed well-nourished man wearing hospital scrubs in no apparent distress pleasant and appropriate mood is described as alright, affect is unremarkable thought processes linear and goal-directed thought content denies current suicidal or homicidal ideation, plan or intent, alert and oriented 3 insight and judgment are fair IQ appears an average range   Current Facility-Administered Medications (Endocrine & Metabolic):  .  levothyroxine (SYNTHROID, LEVOTHROID) tablet 75 mcg       Current Facility-Administered Medications (Analgesics):  .  acetaminophen (TYLENOL) tablet 650 mg     Current Facility-Administered Medications (Other):  .  alum & mag hydroxide-simeth (MAALOX/MYLANTA) 200-200-20 MG/5ML suspension 30 mL .  clonazePAM (KLONOPIN) tablet 0.5 mg .  magnesium hydroxide (MILK OF MAGNESIA) suspension 30 mL .  nicotine (NICODERM CQ - dosed in mg/24 hours) patch 21 mg .  pantoprazole (PROTONIX) EC tablet 20 mg .  sertraline (ZOLOFT) tablet 100 mg .  tamsulosin (FLOMAX) capsule  0.4 mg .  traZODone (DESYREL) tablet 25 mg  No current outpatient prescriptions on file.  Vital Signs:Blood pressure 105/71, pulse 90, temperature 97.5 F (36.4 C), temperature source Oral, resp. rate 18, height 6\' 4"  (1.93 m), weight 97.5 kg (215 lb), SpO2 97 %.    Lab Results: No results found for this or any previous visit (from the past 48 hour(s)).  Physical Findings: AIMS: Facial and Oral Movements Muscles of Facial Expression: None, normal Lips and Perioral Area: None, normal Jaw: None, normal Tongue: None, normal,Extremity Movements Upper (arms, wrists, hands, fingers): None, normal Lower (legs, knees, ankles, toes): None, normal, Trunk Movements Neck, shoulders, hips: None, normal, Overall Severity Severity of abnormal movements (highest score from questions above): None, normal Incapacitation due to abnormal movements: None, normal Patient's awareness of abnormal movements (rate only patient's report): No Awareness, Dental Status Current problems with teeth and/or dentures?: No Does patient usually wear dentures?: No  CIWA:    COWS:  COWS Total Score: 0   Assessment/Plan: Patient has made some improvement with current medications and we will continue them. Patient is encouraged 8 to continue to work on strategies for when he is released to avoid being over isolated and decompensating again.  Acquanetta SitElizabeth Woods Oates, MD 07/21/2016, 1:48 PM

## 2016-07-21 NOTE — Plan of Care (Signed)
Problem: Medication: Goal: Compliance with prescribed medication regimen will improve Outcome: Progressing Compliant with medication regimen  Problem: Self-Concept: Goal: Ability to disclose and discuss suicidal ideas will improve Outcome: Progressing Denies SI   

## 2016-07-22 NOTE — Progress Notes (Signed)
Pt attended karaoke group this evening.  

## 2016-07-22 NOTE — [facility] Group Notes (Signed)
[facility] LCSW Group Therapy 07/22/2016  1:15 pm   Type of Therapy: Group Therapy Participation Level: Active  Participation Quality: Attentive, Sharing and Supportive  Affect: Appropriate  Cognitive: Alert and Oriented  Insight: Developing/Improving and Engaged  Engagement in Therapy: Developing/Improving and Engaged  Modes of Intervention: Clarification, Confrontation, Discussion, Education, Exploration, Limit-setting, Orientation, Problem-solving, Rapport Building, Dance movement psychotherapisteality Testing, Socialization and Support  Summary of Progress/Problems: The topic for group was balance in life. Today's group focused on defining balance in one's own words, identifying things that can knock one off balance, and exploring healthy ways to maintain balance in life. Group members were asked to provide an example of a time when they felt off balance, describe how they handled that situation,and process healthier ways to regain balance in the future. Group members were asked to share the most important tool for maintaining balance that they [facility] and how they plan to apply this method after discharge. Patient discussed the importance of routine and planning when it comes to his recovery. He shared some anxiety about returning home and expressed interest in volunteering and support groups to keep him busy.   Christopher BruinKristin Kaydance Giles, MSW, LCSW Clinical Social Worker [facility] [phone]

## 2016-07-22 NOTE — Progress Notes (Signed)
Patient ID: Roanna BanningJames C Halfhill, male   DOB: 1966/08/19, [age] y.o.   MRN: [id number] D: Client visible on the unit, seen in dayroom watching TV and playing cards. Client smiles as he interacts with Clinical research associatewriter, "you're back" A: Writer encouraged client to report any concerns. Medications reviewed, administered as ordered. Staff will monitor q4915min for safety. R: Client is safe on the unit, attended karaoke.

## 2016-07-22 NOTE — Progress Notes (Signed)
[facility]  07/22/2016 11:17 AM  Patient Active Problem List   Diagnosis Date Noted  . MDD (major depressive disorder), recurrent severe, without psychosis (HCC) 07/16/2016  . Moderate episode of recurrent major depressive disorder (HCC)   . Severe episode of recurrent major depressive disorder, without psychotic features (HCC)     Diagnosis: Major depressive disorder, severe, recurrent  Subjective: Patient reports that his mood is doing fairly well today. He feels like he is a bit mentally sharper and feels the medications are starting to work. He denies any suicidal or homicidal ideation plan or intent at present. We discussed strategies that he can use when he returns to his home as planned perhaps next week. We conceptualized a scenario in which he is in his house and becomes overwhelmed with depressive feelings-- what he could do. The patient states at first he doesn't know what he could do but after some reflection identifies going to the park or taking a walk. We also discussed if he had any resources he could contact such as his sponsor and he agrees he could call his sponsor. Patient was given "homework" to work on making a list of specific actions he could take with the idea that he could use this as a guide rather than having to come up with these things in the heat of the moment.  Objective: Well developed well nourished man in no apparent distress wearing hospital paper scrubs fairly well-groomed pleasant and appropriate speech and motor within normal limits mood is all right and affect is euthymic  thought processes are linear and goal directed, thought content denies current suicidal or homicidal ideation, plan or intent alert and oriented 3 insight and judgment are fair IQ appears in average range  Patient's blood pressure has been slightly low.    Current Facility-Administered Medications (Endocrine & Metabolic):  .  levothyroxine (SYNTHROID, LEVOTHROID) tablet 75  mcg       Current Facility-Administered Medications (Analgesics):  .  acetaminophen (TYLENOL) tablet 650 mg     Current Facility-Administered Medications (Other):  .  alum & mag hydroxide-simeth (MAALOX/MYLANTA) 200-200-20 MG/5ML suspension 30 mL .  clonazePAM (KLONOPIN) tablet 0.5 mg .  magnesium hydroxide (MILK OF MAGNESIA) suspension 30 mL .  nicotine (NICODERM CQ - dosed in mg/24 hours) patch 21 mg .  pantoprazole (PROTONIX) EC tablet 20 mg .  sertraline (ZOLOFT) tablet 100 mg .  tamsulosin (FLOMAX) capsule 0.4 mg .  traZODone (DESYREL) tablet 25 mg  No current outpatient prescriptions on file.  Vital Signs:Blood pressure (!) 85/60, pulse 78, temperature 97.5 F (36.4 C), temperature source Oral, resp. rate 16, height 6\' 4"  (1.93 m), weight 97.5 kg (215 lb), SpO2 97 %.    Lab Results: No results found for this or any previous visit (from the past 48 hour(s)).  Physical Findings: AIMS: Facial and Oral Movements Muscles of Facial Expression: None, normal Lips and Perioral Area: None, normal Jaw: None, normal Tongue: None, normal,Extremity Movements Upper (arms, wrists, hands, fingers): None, normal Lower (legs, knees, ankles, toes): None, normal, Trunk Movements Neck, shoulders, hips: None, normal, Overall Severity Severity of abnormal movements (highest score from questions above): None, normal Incapacitation due to abnormal movements: None, normal Patient's awareness of abnormal movements (rate only patient's report): No Awareness, Dental Status Current problems with teeth and/or dentures?: No Does patient usually wear dentures?: No  CIWA:    COWS:  COWS Total Score: 0   Assessment/Plan: Patient feels he is improving in the inpatient  situation although he is still white anxious about returning to the outpatient situation where there will be more challenges in terms of receiving the social supports that he needs. Patient will work over the weekend on formulating  an action plan for possible recurrence of severe depression once released. Patient feels current psychiatric medications are working well and we will continue them without changes at the present time. Patient is on Flomax which can lower blood pressure and I will discuss with him whether or not he wishes to continue this medication or if he is feeling any excessive dizziness, fatigue R asthenia that might be related to too low blood pressure at this time.  Acquanetta SitElizabeth Woods Anzel Kearse, MD 07/22/2016, 11:17 AM

## 2016-07-22 NOTE — Plan of Care (Signed)
Problem: Self-Concept: Goal: Ability to verbalize positive feelings about self will improve Outcome: Progressing Client verbalize positive feelings about self improves AEB "talking positive things to myself" "I been up today" "depression is worse first thing in the mornings"

## 2016-07-22 NOTE — Progress Notes (Signed)
Patient ID: Christopher Giles, male   DOB: [birth year], 50 y.o.   MRN: [id number]  DAR: Pt. Denies SI/HI and A/V Hallucinations. He reports sleep is good, appetite is good, energy level is low, and concentration is poor. He rates depression 5/10, hopelessness 5/10, and anxiety 4/10. Patient does not report any pain or discomfort at this time. Support and encouragement provided to the patient. Scheduled medications administered to patient per physician's orders. Patient is receptive and cooperative. He reports his goal for the day is to stay up and in day room. He has met this goal as evidenced by playing cards in the dayroom and staying out of his room. Q15 minute checks are maintained for safety.

## 2016-07-23 MED ORDER — CHOLECALCIFEROL 10 MCG (400 UNIT) PO TABS
400.0000 [IU] | ORAL_TABLET | Freq: Two times a day (BID) | ORAL | Status: DC
  Administered 2016-07-23: 400 [IU] via ORAL
  Administered 2016-07-23: 20:00:00 via ORAL
  Administered 2016-07-24 – 2016-07-28 (×9): 400 [IU] via ORAL
  Filled 2016-07-23 (×15): qty 1

## 2016-07-23 NOTE — [facility] Suicide Risk Assessment (Signed)
[facility] INPATIENT:  Family/Significant Other Suicide Prevention Education  Suicide Prevention Education:  Contact Attempts: sister Veva Holesnn Aldridge [phone], (name of family member/significant other) has been identified by the patient as the family member/significant other with whom the patient will be residing, and identified as the person(s) who will aid the patient in the event of a mental health crisis.  With written consent from the patient, two attempts were made to provide suicide prevention education, prior to and/or following the patient's discharge.  We were unsuccessful in providing suicide prevention education.  A suicide education pamphlet was given to the patient to share with family/significant other.  Date and time of first attempt: 07/23/16 at 10:15am Date and time of second attempt: 07/23/16 at 3:50pm  Sharrie Self L Burnette Sautter 07/23/2016, 10:16 AM

## 2016-07-23 NOTE — [facility] Group Notes (Signed)
[facility] LCSW Group Therapy 07/23/2016 1:15 PM Type of Therapy: Group Therapy Participation Level: Active  Participation Quality: Attentive, Sharing and Supportive  Affect: Appropriate  Cognitive: Alert and Oriented  Insight: Developing/Improving and Engaged  Engagement in Therapy: Developing/Improving and Engaged  Modes of Intervention: Clarification, Confrontation, Discussion, Education, Exploration, Limit-setting, Orientation, Problem-solving, Rapport Building, Dance movement psychotherapisteality Testing, Socialization and Support  Summary of Progress/Problems: The topic for today was feelings about relapse. Pt discussed what relapse prevention is to them and identified triggers that they are on the path to relapse. Pt processed their feeling towards relapse and was able to relate to peers. Pt discussed coping skills that can be used for relapse prevention. Patient became emotional during group discussion and shared about his longest time of recovery which was 10 years. He shared what was different about that time which was that he was involved in running an AA program and was in a relationship.    Samuella BruinKristin Kourtland Coopman, MSW, LCSW Clinical Social Worker [facility] [phone]

## 2016-07-23 NOTE — Progress Notes (Addendum)
[facility] MD Progress Note  07/23/2016 10:17 AM  Patient Active Problem List   Diagnosis Date Noted  . MDD (major depressive disorder), recurrent severe, without psychosis (HCC) 07/16/2016  . Moderate episode of recurrent major depressive disorder (HCC)   . Severe episode of recurrent major depressive disorder, without psychotic features (HCC)     Diagnosis: Major depressive disorder, severe  Subjective: Patient is lying in bed but reports his mood is fine and he denies current suicidal or homicidal ideation, plan or intent. We discussed that his vitamin D checked last month was only 9.7 and although the significance of this is not known he was offered supplementation with vitamin D3 and does agree to do so. We also discussed that his Flomax may be lowering his blood pressure excessively and this could be contributing to some of his feelings of lack of energy or lightheadedness. Patient states he has been off Flomax without issue in the past and agrees to a trial off Flomax this weekend to evaluate how he feels without it. Flomax also has been noted to rarely possibly be implicated in depression and it might be helpful to discontinue it on that basis as well.  Objective: Well-developed well-nourished man lying in bed in no apparent distress mood is okay and affect is unremarkable thought processes are linear and goal-directed thought content denies current suicidal or homicidal ideation, plan or intent, alert and oriented 3, insight and judgment are fair, IQ appears in average range   Current Facility-Administered Medications (Endocrine & Metabolic):  .  levothyroxine (SYNTHROID, LEVOTHROID) tablet 75 mcg       Current Facility-Administered Medications (Analgesics):  .  acetaminophen (TYLENOL) tablet 650 mg     Current Facility-Administered Medications (Other):  .  alum & mag hydroxide-simeth (MAALOX/MYLANTA) 200-200-20 MG/5ML suspension 30 mL .  clonazePAM (KLONOPIN) tablet 0.5 mg .   magnesium hydroxide (MILK OF MAGNESIA) suspension 30 mL .  nicotine (NICODERM CQ - dosed in mg/24 hours) patch 21 mg .  pantoprazole (PROTONIX) EC tablet 20 mg .  sertraline (ZOLOFT) tablet 100 mg .  tamsulosin (FLOMAX) capsule 0.4 mg .  traZODone (DESYREL) tablet 25 mg  No current outpatient prescriptions on file.  Vital Signs:Blood pressure 93/62, pulse 80, temperature 97.6 F (36.4 C), temperature source Oral, resp. rate 16, height  (1.93 m), weight 97.5 kg (215 lb), SpO2 97 %.    Lab Results: No results found for this or any previous visit (from the past 48 hour(s)).  Physical Findings: AIMS: Facial and Oral Movements Muscles of Facial Expression: None, normal Lips and Perioral Area: None, normal Jaw: None, normal Tongue: None, normal,Extremity Movements Upper (arms, wrists, hands, fingers): None, normal Lower (legs, knees, ankles, toes): None, normal, Trunk Movements Neck, shoulders, hips: None, normal, Overall Severity Severity of abnormal movements (highest score from questions above): None, normal Incapacitation due to abnormal movements: None, normal Patient's awareness of abnormal movements (rate only patient's report): No Awareness, Dental Status Current problems with teeth and/or dentures?: No Does patient usually wear dentures?: No  CIWA:    COWS:  COWS Total Score: 0   Assessment/Plan: Patient has slowly made progress in terms of his mood and thoughts of self-harm. He does currently complain of decreased mood and energy in the morning and we're going to discontinue his Flomax secondary to possible depressant effects and lowering his blood pressure too much and observe how he tolerates this. We'll start the patient on vitamin D3 replacement for low vitamin D. Patient will be  encouraged to spend the weekend formulating a written action plan for when discharged so he will have that available to him immediately should he experience any recurrence of severe depression  after he is discharged next week.  Acquanetta SitElizabeth Woods Oates, MD 07/23/2016, 10:17 AM

## 2016-07-23 NOTE — Tx Team (Signed)
Interdisciplinary Treatment and Diagnostic Plan Update  07/23/2016 Time of Session: 9:30am Christopher Giles MRN: [id number]  Principal Diagnosis: MDD (major depressive disorder), recurrent severe, without psychosis (HCC)  Secondary Diagnoses: Principal Problem:   MDD (major depressive disorder), recurrent severe, without psychosis (HCC)   Current Medications:  Current Facility-Administered Medications  Medication Dose Route Frequency Provider Last Rate Last Dose  . acetaminophen (TYLENOL) tablet 650 mg  650 mg Oral Q6H PRN Charm RingsJamison Y Lord, NP   650 mg at 07/22/16 2138  . alum & mag hydroxide-simeth (MAALOX/MYLANTA) 200-200-20 MG/5ML suspension 30 mL  30 mL Oral Q4H PRN Charm RingsJamison Y Lord, NP   30 mL at 07/19/16 1641  . clonazePAM (KLONOPIN) tablet 0.5 mg  0.5 mg Oral BID Acquanetta SitElizabeth Woods Oates, MD   0.5 mg at 07/23/16 [id number]  . levothyroxine (SYNTHROID, LEVOTHROID) tablet 75 mcg  75 mcg Oral QAC breakfast Charm RingsJamison Y Lord, NP   75 mcg at 07/23/16 [id number]  . magnesium hydroxide (MILK OF MAGNESIA) suspension 30 mL  30 mL Oral Daily PRN Charm RingsJamison Y Lord, NP      . nicotine (NICODERM CQ - dosed in mg/24 hours) patch 21 mg  21 mg Transdermal Q0600 Acquanetta SitElizabeth Woods Oates, MD   21 mg at 07/23/16 [id number]  . pantoprazole (PROTONIX) EC tablet 20 mg  20 mg Oral Daily Adonis BrookSheila Agustin, NP   20 mg at 07/23/16 [id number]  . sertraline (ZOLOFT) tablet 100 mg  100 mg Oral Daily Acquanetta SitElizabeth Woods Oates, MD   100 mg at 07/23/16 [id number]  . tamsulosin (FLOMAX) capsule 0.4 mg  0.4 mg Oral QHS Charm RingsJamison Y Lord, NP   0.4 mg at 07/22/16 2236  . traZODone (DESYREL) tablet 25 mg  25 mg Oral QHS PRN Acquanetta SitElizabeth Woods Oates, MD   25 mg at 07/22/16 2236   PTA Medications: Prescriptions Prior to Admission  Medication Sig Dispense Refill Last Dose  . buPROPion (WELLBUTRIN XL) 150 MG 24 hr tablet Take 1 tablet (150 mg total) by mouth daily. 30 tablet 0 07/14/2016 at Unknown time  . cholecalciferol (VITAMIN D) 1000 units tablet Take 1 tablet (1,000 Units  total) by mouth daily. 30 tablet 0 07/14/2016 at Unknown time  . gabapentin (NEURONTIN) 300 MG capsule Take 2 capsules (600 mg total) by mouth 3 (three) times daily. 60 capsule 0 07/14/2016 at Unknown time  . hydrOXYzine (ATARAX/VISTARIL) 25 MG tablet Take 1 tablet (25 mg total) by mouth every 6 (six) hours as needed for anxiety. 30 tablet 0 07/14/2016 at Unknown time  . levothyroxine (SYNTHROID, LEVOTHROID) 75 MCG tablet Take 1 tablet (75 mcg total) by mouth daily before breakfast. 30 tablet 0 07/15/2016 at Unknown time  . lithium carbonate 150 MG capsule Take 3 capsules (450 mg total) by mouth at bedtime. 45 capsule 0 Past Week at Unknown time  . mirtazapine (REMERON) 30 MG tablet Take 1 tablet (30 mg total) by mouth at bedtime. 30 tablet 0 Past Week at Unknown time  . tamsulosin (FLOMAX) 0.4 MG CAPS capsule Take 1 capsule (0.4 mg total) by mouth at bedtime. 30 capsule 0 Past Week at Unknown time    Patient Stressors:    Patient Strengths: Average or above average intelligence General fund of knowledge Motivation for treatment/growth  Treatment Modalities: Medication Management, Group therapy, Case management,  1 to 1 session with clinician, Psychoeducation, Recreational therapy.   Physician Treatment Plan for Primary Diagnosis: MDD (major depressive disorder), recurrent severe, without psychosis (HCC) Long Term Goal(s): Improvement in symptoms so as  ready for discharge Improvement in symptoms so as ready for discharge   Short Term Goals: Ability to disclose and discuss suicidal ideas Ability to demonstrate self-control will improve Ability to identify changes in lifestyle to reduce recurrence of condition will improve  Medication Management: Evaluate patient's response, side effects, and tolerance of medication regimen.  Therapeutic Interventions: 1 to 1 sessions, Unit Group sessions and Medication administration.  Evaluation of Outcomes: Progressing   RN Treatment Plan for Primary  Diagnosis: MDD (major depressive disorder), recurrent severe, without psychosis (HCC) Long Term Goal(s): Knowledge of disease and therapeutic regimen to maintain health will improve  Short Term Goals: Ability to remain free from injury will improve, Ability to disclose and discuss suicidal ideas, Ability to identify and develop effective coping behaviors will improve and Compliance with prescribed medications will improve  Medication Management: RN will administer medications as ordered by provider, will assess and evaluate patient's response and provide education to patient for prescribed medication. RN will report any adverse and/or side effects to prescribing provider.  Therapeutic Interventions: 1 on 1 counseling sessions, Psychoeducation, Medication administration, Evaluate responses to treatment, Monitor vital signs and CBGs as ordered, Perform/monitor CIWA, COWS, AIMS and Fall Risk screenings as ordered, Perform wound care treatments as ordered.  Evaluation of Outcomes: Progressing   LCSW Treatment Plan for Primary Diagnosis: MDD (major depressive disorder), recurrent severe, without psychosis (HCC) Long Term Goal(s): Safe transition to appropriate next level of care at discharge, Engage patient in therapeutic group addressing interpersonal concerns.  Short Term Goals: Engage patient in aftercare planning with referrals and resources, Increase social support, Increase emotional regulation, Facilitate acceptance of mental health diagnosis and concerns, Identify triggers associated with mental health/substance abuse issues and Increase skills for wellness and recovery  Therapeutic Interventions: Assess for all discharge needs, 1 to 1 time with Social worker, Explore available resources and support systems, Assess for adequacy in community support network, Educate family and significant other(s) on suicide prevention, Complete Psychosocial Assessment, Interpersonal group therapy.  Evaluation  of Outcomes: Progressing   Progress in Treatment :  Attending groups: Yes  Participating in groups: Yes  Taking medication as prescribed: Yes, MD continuing to assess for appropriate medication regimen  Toleration medication: Yes  Family/Significant other contact made: CSW assessing for appropriate contacts  Patient understands diagnosis: Yes  Discussing patient identified problems/goals with staff: Yes  Medical problems stabilized or resolved: Yes  Denies suicidal/homicidal ideation: Yes, denies  Issues/concerns per patient self-inventory: None reported  Other: N/A  New problem(s) identified: None reported at this time    New Short Term/Long Term Goal(s): None at this time    Discharge Plan or Barriers: Patient plans to return home to follow up with outpatient services.     Reason for Continuation of Hospitalization: Anxiety Depression Medication stabilization Withdrawal symptoms  Estimated Length of Stay: 2-3 days    Attendees:  Patient:   Physician: Dr. Randa Evensates, Cobos , MD  07/23/2016   9:30am  Nursing: Melanie CrazierBobbi Tornblum, Jane O., Patty Duke, RN 07/23/2016 9:30am  RN Care Manager: Onnie BoerJennifer Clark, CM  07/23/2016 9:30am  Social Workers: Chad CordialLauren Carter, LCSW, Samuella BruinKristin Shreena Baines, LCSW, [place]ropolisHeather Smart, LCSW   07/23/2016 9:30am  Nurse Pratictioners: Claudette Headonrad Withrow, NP 07/23/2016 9:30am  Other:     Scribe for Treatment Team: Samuella BruinKristin Shyvonne Chastang, LCSW Clinical Social Worker [facility] [phone]   Scribe for Treatment Team: Samuella BruinKristin Catharine Kettlewell, LCSW Clinical Social Worker [facility] [phone]

## 2016-07-23 NOTE — Progress Notes (Signed)
Recreation Therapy Notes  Date: 07/23/16 Time: 0930 Location: 300 Hall Dayroom  Group Topic: Stress Management  Goal Area(s) Addresses:  Patient will verbalize importance of using healthy stress management.  Patient will identify positive emotions associated with healthy stress management.   Intervention: Stress Management  Activity :  Progressive Muscle Relaxation.  LRT introduced the stress management technique of progressive muscle relaxation.  LRT read a script to guide patients through the technique.  Patients were to follow along as LRT read script.  Education:  Stress Management, Discharge Planning.   Education Outcome: Acknowledges edcuation/In group clarification offered/Needs additional education  Clinical Observations/Feedback: Pt did not attend  group.    Spurgeon Gancarz, LRT/CTRS         Abrianna Sidman A 07/23/2016 11:44 AM 

## 2016-07-23 NOTE — Progress Notes (Signed)
DAR NOTE: Patient presents with anxious affect and depressed mood. Pt stayed in his most of the time, not interacting much. Pt has been calm and follow the unit rules. Denies pain, auditory and visual hallucinations.  Rates depression at 4, hopelessness at 5, and anxiety at 3.  Maintained on routine safety checks.  Medications given as prescribed.  Support and encouragement offered as needed.  States goal for today is "stay in the moment."  Patient observed socializing with peers at some point in the dayroom.  Offered no complaint.

## 2016-07-23 NOTE — Progress Notes (Signed)
Patient did attend the evening speaker AA meeting.  

## 2016-07-23 NOTE — Progress Notes (Signed)
D.  Pt pleasant on approach, denies complaints at this time.  Positive for evening AA group, observed interacting appropriately with peers on the unit.  Pt denies SI/HI/hallucinations at this time.  A.  Support and encouragement offered, medications given as ordered.  R.  Pt remains safe on the unit, will continue to monitor.

## 2016-07-24 DIAGNOSIS — Z8489 Family history of other specified conditions: Secondary | ICD-10-CM

## 2016-07-24 NOTE — Progress Notes (Signed)
D.  Pt pleasant on approach, no complaints voiced.  Pt was positive for evening wrap up group, observed interacting appropriately with peers on the unit.  Pt denies SI/HI/hallucinations at this time.  A.  Support and encouragement offered, medication given as ordered.  R.  Pt remains safe on the unit, will continue to monitor.

## 2016-07-24 NOTE — [facility] Group Notes (Signed)
Adult Group Therapy Note  Date:  07/24/2016  Time: 10:00AM-11:00AM  Group Topic/Focus: Today's group focused on the topic of life problems and healthy coping skills.  An exercise was performed which elicited specific problems that led to various patients being admitted to this hospital, giving an opportunity for other patients to identify with those life issues.  After a discussion of each, an unhealthy coping skill and suggestions for healthy coping skills to deal with that problems were named.  These were also listed on the whiteboard.   Reflective listening was used to help patients connect with each other on similarities rather than to focus on differences.  Participation Level:  Minimal  Participation Quality:  Attentive  Affect:  Depressed and Flat  Cognitive:  Appropriate  Insight: Improving  Engagement in Group:  Developing/Improving  Modes of Intervention:  Activity, Discussion and Support  Additional Comments:  The patient expressed in group, during the short time he was there (for only the last 15 minutes of group) that the list on the whiteboard described his actions in the face of problems.  He stated he has isolated himself severely, and said he has to make himself go to his 12-step meetings, but also to support groups.   Carloyn JaegerMareida J Grossman-Orr 07/24/2016 , 12:49 PM

## 2016-07-24 NOTE — [facility] Group Notes (Signed)
Patient attended and actively participated in nurse-led psycho-educational group.  

## 2016-07-24 NOTE — Progress Notes (Signed)
[facility]  07/24/2016 6:08 PM Christopher Giles  MRN:  [id number] Subjective:  Patient reports " I am okay"  Objective:Christopher Giles is awake, alert and oriented X4.sitting in the dayroom interacting with peers.  Denies suicidal or homicidal ideation. Denies auditory or visual hallucination and does not appear to be responding to internal stimuli. [facility] Patient reports he is medication compliant without mediation side effects. Report learning new coping skills. States  his depression 2/10. Patient states "I am feeling okay today."  Reports fair appetite for the past 5 days, states after this recent overdose he has been experiencing some inter gestin and GI issues. Patient reports resting well.- of noted provided information regarding the orange card and [facility]. Support, encouragement and reassurance was provided.   Principal Problem: MDD (major depressive disorder), recurrent severe, without psychosis (HCC) Diagnosis:   Patient Active Problem List   Diagnosis Date Noted  . MDD (major depressive disorder), recurrent severe, without psychosis (HCC) [F33.2] 07/16/2016  . Moderate episode of recurrent major depressive disorder (HCC) [F33.1]   . Severe episode of recurrent major depressive disorder, without psychotic features (HCC) [F33.2]    Total Time spent with patient: 45 minutes  Past Psychiatric History:   Past Medical History:  Past Medical History:  Diagnosis Date  . Anxiety   . Depression   . Substance abuse   . Suicide attempt    x 6 per pt  . Thyroid disease    hypo    Past Surgical History:  Procedure Laterality Date  . punctured lung     Family History:  Family History  Problem Relation Age of Onset  . Hyperlipidemia Mother   . Cancer Mother   . Cancer Father   . Diabetes Maternal Grandmother    Family Psychiatric  History:  Social History:  History  Alcohol Use No     History  Drug Use No     Social History   Social History  . Marital status: Single    Spouse name: N/A  . Number of children: N/A  . Years of education: N/A   Social History Main Topics  . Smoking status: Current Every Day Smoker    Packs/day: 1.00    Types: Cigarettes  . Smokeless tobacco: Never Used  . Alcohol use No  . Drug use: No  . Sexual activity: No   Other Topics Concern  . None   Social History Narrative  . None   Additional Social History:                         Sleep: Fair  Appetite:  Fair  Current Medications: Current Facility-Administered Medications  Medication Dose Route Frequency Provider Last Rate Last Dose  . acetaminophen (TYLENOL) tablet 650 mg  650 mg Oral Q6H PRN Charm RingsJamison Y Lord, NP   650 mg at 07/22/16 2138  . alum & mag hydroxide-simeth (MAALOX/MYLANTA) 200-200-20 MG/5ML suspension 30 mL  30 mL Oral Q4H PRN Charm RingsJamison Y Lord, NP   30 mL at 07/19/16 1641  . cholecalciferol (VITAMIN D) tablet 400 Units  400 Units Oral BID Acquanetta SitElizabeth Woods Oates, MD   400 Units at 07/24/16 0840  . clonazePAM (KLONOPIN) tablet 0.5 mg  0.5 mg Oral BID Acquanetta SitElizabeth Woods Oates, MD   0.5 mg at 07/24/16 1734  . levothyroxine (SYNTHROID, LEVOTHROID) tablet 75 mcg  75 mcg Oral QAC breakfast Charm RingsJamison Y Lord, NP  75 mcg at 07/24/16 0604  . magnesium hydroxide (MILK OF MAGNESIA) suspension 30 mL  30 mL Oral Daily PRN Charm RingsJamison Y Lord, NP      . nicotine (NICODERM CQ - dosed in mg/24 hours) patch 21 mg  21 mg Transdermal Q0600 Acquanetta SitElizabeth Woods Oates, MD   21 mg at 07/24/16 0913  . pantoprazole (PROTONIX) EC tablet 20 mg  20 mg Oral Daily Adonis BrookSheila Agustin, NP   20 mg at 07/24/16 0840  . sertraline (ZOLOFT) tablet 100 mg  100 mg Oral Daily Acquanetta SitElizabeth Woods Oates, MD   100 mg at 07/24/16 0840  . traZODone (DESYREL) tablet 25 mg  25 mg Oral QHS PRN Acquanetta SitElizabeth Woods Oates, MD   25 mg at 07/23/16 2217    Lab Results: No results found for this or any previous visit (from the past 48 hour(s)).  Blood Alcohol  level:  Lab Results  Component Value Date   ETH <5 07/15/2016   ETH <5 07/09/2016    Metabolic Disorder Labs: No results found for: HGBA1C, MPG No results found for: PROLACTIN No results found for: CHOL, TRIG, HDL, CHOLHDL, VLDL, LDLCALC  Physical Findings: AIMS: Facial and Oral Movements Muscles of Facial Expression: None, normal Lips and Perioral Area: None, normal Jaw: None, normal Tongue: None, normal,Extremity Movements Upper (arms, wrists, hands, fingers): None, normal Lower (legs, knees, ankles, toes): None, normal, Trunk Movements Neck, shoulders, hips: None, normal, Overall Severity Severity of abnormal movements (highest score from questions above): None, normal Incapacitation due to abnormal movements: None, normal Patient's awareness of abnormal movements (rate only patient's report): No Awareness, Dental Status Current problems with teeth and/or dentures?: No Does patient usually wear dentures?: No  CIWA:    COWS:  COWS Total Score: 0  Musculoskeletal: Strength & Muscle Tone: within normal limits Gait & Station: normal Patient leans: N/A  Psychiatric Specialty Exam: Physical Exam  Nursing note and vitals reviewed. Constitutional: He is oriented to person, place, and time. He appears well-developed.  HENT:  Head: Normocephalic.  Neurological: He is alert and oriented to person, place, and time.  Psychiatric: His behavior is normal.    Review of Systems  Psychiatric/Behavioral: Positive for depression and substance abuse. Negative for suicidal ideas. The patient is nervous/anxious.     Blood pressure 103/62, pulse 80, temperature 97.6 F (36.4 C), temperature source Oral, resp. rate 16, height 6\' 4"  (1.93 m), weight 97.5 kg (215 lb), SpO2 97 %.Body mass index is 26.17 kg/m.  General Appearance: Casual  Eye Contact:  Fair  Speech:  Clear and Coherent  Volume:  Normal  Mood:  Anxious and Depressed  Affect:  Appropriate  Thought Process:  Coherent   Orientation:  Full (Time, Place, and Person)  Thought Content:  Hallucinations: None  Suicidal Thoughts:  Yes.  with intent/plan (passive thoughts)  Homicidal Thoughts:  No  Memory:  Immediate;   Fair Recent;   Fair Remote;   Fair  Judgement:  Fair  Insight:  Present  Psychomotor Activity:  Restlessness  Concentration:  Concentration: Fair  Recall:  Fis[employer]Fair  Fund of Knowledge:  Fair  Language:  Fair  Akathisia:  No  Handed:  Right  AIMS (if indicated):     Assets:  Communication Skills Desire for Improvement Resilience Social Support  ADL's:  Intact  Cognition:  WNL  Sleep:  Number of Hours: 6     I agree with current treatment plan on 11/[id number], Patient seen face-to-face for psychiatric evaluation follow-up, chart reviewed and case discussed  with the MD Arfeen. Reviewed the information documented and agree with the treatment plan.  Treatment Plan Summary: Daily contact with patient to assess and evaluate symptoms and progress in treatment and Medication management   Continue with Zoloft 100 mg  for mood stabilization. Continue with Trazodone 25mg  for insomnia  Will continue to monitor vitals ,medication compliance and treatment side effects while patient is here.  CSW will start working on disposition.  Patient to participate in therapeutic milieu  Oneta Rack, NP 07/24/2016, 6:08 PM

## 2016-07-24 NOTE — Progress Notes (Signed)
[facility] Group Notes:  (Nursing/MHT/Case Management/Adjunct)  Date:  07/24/2016  Time:  2100  Type of Therapy:  wrap up group  Participation Level:  Active  Participation Quality:  Appropriate, Attentive, Sharing and Supportive  Affect:  Appropriate  Cognitive:  Appropriate  Insight:  Improving  Engagement in Group:  Engaged  Modes of Intervention:  Clarification, Education and Support  Summary of Progress/Problems: Pt shared that he plans to follow up at [employer] meetings to focus on his mental issues rather than continue with AA because he feels like [facility]. Pt reports having 11 yrs sober. Pt encouraged to do both.    Marcille BuffyMcNeil, Lariya Kinzie S 07/24/2016, 10:40 PM

## 2016-07-25 DIAGNOSIS — Z8249 Family history of ischemic heart disease and other diseases of the circulatory system: Secondary | ICD-10-CM

## 2016-07-25 NOTE — Progress Notes (Signed)
[facility]  07/25/2016 6:53 PM Roanna BanningJames C Hoecker  MRN:  [id number] Subjective:  Patient reports " I feel much better this time around. I'm glad my meds are more simple this time and I don't have to feel doped up on too many meds at the same time."  Objective: Pt seen and chart reviewed. Pt is alert/oriented x4, calm, cooperative, and appropriate to situation. Pt denies suicidal/homicidal ideation and psychosis and does not appear to be responding to internal stimuli. Pt states that this is the most lucid he has felt in quite some time and that he feels his medications are working very well.   Principal Problem: MDD (major depressive disorder), recurrent severe, without psychosis (HCC) Diagnosis:   Patient Active Problem List   Diagnosis Date Noted  . Severe episode of recurrent major depressive disorder, without psychotic features (HCC) [F33.2]     Priority: High  . MDD (major depressive disorder), recurrent severe, without psychosis (HCC) [F33.2] 07/16/2016  . Moderate episode of recurrent major depressive disorder (HCC) [F33.1]    Total Time spent with patient: 15 minutes  Past Psychiatric History:   Past Medical History:  Past Medical History:  Diagnosis Date  . Anxiety   . Depression   . Substance abuse   . Suicide attempt    x 6 per pt  . Thyroid disease    hypo    Past Surgical History:  Procedure Laterality Date  . punctured lung     Family History:  Family History  Problem Relation Age of Onset  . Hyperlipidemia Mother   . Cancer Mother   . Cancer Father   . Diabetes Maternal Grandmother    Family Psychiatric  History:  Social History:  History  Alcohol Use No     History  Drug Use No    Social History   Social History  . Marital status: Single    Spouse name: N/A  . Number of children: N/A  . Years of education: N/A   Social History Main Topics  . Smoking status: Current Every Day Smoker    Packs/day: 1.00    Types: Cigarettes  .  Smokeless tobacco: Never Used  . Alcohol use No  . Drug use: No  . Sexual activity: No   Other Topics Concern  . None   Social History Narrative  . None   Additional Social History:                         Sleep: Good  Appetite:  Good  Current Medications: Current Facility-Administered Medications  Medication Dose Route Frequency Provider Last Rate Last Dose  . acetaminophen (TYLENOL) tablet 650 mg  650 mg Oral Q6H PRN Charm RingsJamison Y Lord, NP   650 mg at 07/22/16 2138  . alum & mag hydroxide-simeth (MAALOX/MYLANTA) 200-200-20 MG/5ML suspension 30 mL  30 mL Oral Q4H PRN Charm RingsJamison Y Lord, NP   30 mL at 07/19/16 1641  . cholecalciferol (VITAMIN D) tablet 400 Units  400 Units Oral BID Acquanetta SitElizabeth Woods Oates, MD   400 Units at 07/25/16 [phone] [id number]  . clonazePAM (KLONOPIN) tablet 0.5 mg  0.5 mg Oral BID Acquanetta SitElizabeth Woods Oates, MD   0.5 mg at 07/25/16 1724  . levothyroxine (SYNTHROID, LEVOTHROID) tablet 75 mcg  75 mcg Oral QAC breakfast Charm RingsJamison Y Lord, NP   75 mcg at 07/25/16 0604  . magnesium hydroxide (MILK OF MAGNESIA) suspension 30 mL  30 mL Oral Daily PRN Charm RingsJamison Y Lord,  NP      . nicotine (NICODERM CQ - dosed in mg/24 hours) patch 21 mg  21 mg Transdermal Q0600 Acquanetta SitElizabeth Woods Oates, MD   21 mg at 07/25/16 [id number]  . pantoprazole (PROTONIX) EC tablet 20 mg  20 mg Oral Daily Adonis BrookSheila Agustin, NP   20 mg at 07/25/16 0807  . sertraline (ZOLOFT) tablet 100 mg  100 mg Oral Daily Acquanetta SitElizabeth Woods Oates, MD   100 mg at 07/25/16 [id number]  . traZODone (DESYREL) tablet 25 mg  25 mg Oral QHS PRN Acquanetta SitElizabeth Woods Oates, MD   25 mg at 07/24/16 2110    Lab Results: No results found for this or any previous visit (from the past 48 hour(s)).  Blood Alcohol level:  Lab Results  Component Value Date   ETH <5 07/15/2016   ETH <5 07/09/2016    Metabolic Disorder Labs: No results found for: HGBA1C, MPG No results found for: PROLACTIN No results found for: CHOL, TRIG, HDL, CHOLHDL, VLDL, LDLCALC  Physical  Findings: AIMS: Facial and Oral Movements Muscles of Facial Expression: None, normal Lips and Perioral Area: None, normal Jaw: None, normal Tongue: None, normal,Extremity Movements Upper (arms, wrists, hands, fingers): None, normal Lower (legs, knees, ankles, toes): None, normal, Trunk Movements Neck, shoulders, hips: None, normal, Overall Severity Severity of abnormal movements (highest score from questions above): None, normal Incapacitation due to abnormal movements: None, normal Patient's awareness of abnormal movements (rate only patient's report): No Awareness, Dental Status Current problems with teeth and/or dentures?: No Does patient usually wear dentures?: No  CIWA:    COWS:  COWS Total Score: 0  Musculoskeletal: Strength & Muscle Tone: within normal limits Gait & Station: normal Patient leans: N/A  Psychiatric Specialty Exam: Physical Exam  Nursing note and vitals reviewed. Constitutional: He is oriented to person, place, and time. He appears well-developed.  HENT:  Head: Normocephalic.  Neurological: He is alert and oriented to person, place, and time.  Psychiatric: His behavior is normal.    Review of Systems  Psychiatric/Behavioral: Positive for depression and substance abuse. Negative for suicidal ideas. The patient is nervous/anxious.   All other systems reviewed and are negative.   Blood pressure 107/69, pulse 61, temperature 97.5 F (36.4 C), temperature source Oral, resp. rate 16, height 6\' 4"  (1.93 m), weight 97.5 kg (215 lb), SpO2 97 %.Body mass index is 26.17 kg/m.  General Appearance: Casual and fairly groomed  Eye Contact:  Good  Speech:  Clear and Coherent  Volume:  Normal  Mood:  Anxious and Depressed yet improving  Affect:  Appropriate  Thought Process:  Coherent  Orientation:  Full (Time, Place, and Person)  Thought Content:  Hallucinations: None focused on medication regimen  Suicidal Thoughts:  No, denies  Homicidal Thoughts:  No  Memory:   Immediate;   Fair Recent;   Fair Remote;   Fair  Judgement:  Fair  Insight:  Present  Psychomotor Activity:  Restlessness  Concentration:  Concentration: Fair  Recall:  FiservFair  Fund of Knowledge:  Fair  Language:  Fair  Akathisia:  No  Handed:  Right  AIMS (if indicated):     Assets:  Communication Skills Desire for Improvement Resilience Social Support  ADL's:  Intact  Cognition:  WNL  Sleep:  Number of Hours: 6     I agree with current treatment plan on 07/25/2016, Patient seen face-to-face for psychiatric evaluation follow-up, chart reviewed. Reviewed the information documented and agree with the treatment plan.  Treatment Plan Summary: Daily  contact with patient to assess and evaluate symptoms and progress in treatment and Medication management   Continue with Zoloft 100 mg  for mood stabilization. Continue with Trazodone 25mg  for insomnia  Will continue to monitor vitals ,medication compliance and treatment side effects while patient is here.  CSW will start working on disposition.  Patient to participate in therapeutic milieu  Beau Fanny, [name] 07/25/2016, 6:53 PM

## 2016-07-25 NOTE — Progress Notes (Signed)
DAR NOTE: Pt present with flat affect and depressed mood in the unit. Pt has been in the day room with peers, denies physical pain, took all his meds as scheduled. As per self inventory, pt had a good night sleep, fair appetite, normal energy, and good concentration. Pt rate depression at 4, hopeless ness at 4, and anxiety at 3. Pt's safety ensured with 15 minute and environmental checks. Pt currently denies SI/HI and A/V hallucinations. Pt verbally agrees to seek staff if SI/HI or A/VH occurs and to consult with staff before acting on these thoughts. Will continue POC.

## 2016-07-25 NOTE — [facility] Group Notes (Signed)
Adult Therapy Group Note  Date: 07/25/2016  Time:  10:00-11:00AM  Group Topic/Focus: Healthy Support Systems   Building Self Esteem:   The focus of this group was to assist patients in identifying their current healthy supports as well as to list and discuss other supports that can be put in place to help with achieving life goals.  These items included supports such as 12-step groups, individual therapy, psychiatrists, children, faith activities, accountability partner, sponsor, group therapy, support groups, classes on mental health, phone apps/YouTube, gym/exercise, and more.  A song was  played at the end of group with a short discussion about using music as an additional support/coping mechanism.    Participation Level:  Active  Participation Quality:  Attentive and Sharing  Affect:  Blunted and Depressed  Cognitive:  Appropriate  Insight: Good  Engagement in Group:  Engaged  Modes of Intervention:  Activity, Discussion and Support  Additional Comments:  The patient expressed that a current health support is his sister and Alcoholics Anonymous if he will just use them.  He also looks forward to having the support of a therapist when he leaves the hospital and realizes that he has to go to great lengths to make sure he pursues that follow-up or he will once again fall into crisis.  He stated his unhealthy support is he himself as well as his brother with whom he has no contact.  He is very adamant that he has to learn how to adapt his negative thoughts into more beneficial ones.  Lynnell ChadMareida J Grossman-Orr, LCSW 07/25/2016  11:37 AM

## 2016-07-25 NOTE — Progress Notes (Signed)
D.  Pt pleasant on approach, has been worried about peer's situation today.  Pt reports being supportive of peer but realizing that he must concentrate on himself and getting himself well again.  Pt was positive for evening wrap up group, interacting appropriately with peers within the milieu.  Pt denies SI/HI/hallucinations at this time.  A.  Support and encouragement offered, praise given for the way Pt has been a positive support for peers as well as insightful into his own situation.  R.  Pt remain safe on unit, stated that praise made him feel better about himself today because he couldn't see that in himself.  Will continue to monitor.

## 2016-07-25 NOTE — Plan of Care (Signed)
Problem: Activity: Goal: Interest or engagement in activities will improve Outcome: Progressing Pt attending evening groups, appropriately engaged.  Engaged AA speakers in appropriate conversation.

## 2016-07-25 NOTE — [facility] Group Notes (Signed)
[facility] Group Notes:  (Nursing/MHT/Case Management/Adjunct)  Date:  07/25/2016  Time:  5:31 PM  Type of Therapy:  Psychoeducational Skills  Participation Level:  Active  Participation Quality:  Appropriate  Affect:  Appropriate  Cognitive:  Appropriate  Insight:  Appropriate  Engagement in Group:  Engaged  Modes of Intervention:  Problem-solving  Summary of Progress/Problems:  Group encouraged to surround themselves with positive and healthy group/support system when changing to a healthy life style.     Bethann PunchesJane O Johnanna Bakke 07/25/2016, 5:31 PM

## 2016-07-26 NOTE — Progress Notes (Signed)
[facility] Group Notes:  (Nursing/MHT/Case Management/Adjunct)  Date:  07/25/2016  Time:  2100  Type of Therapy:  wrap up group  Participation Level:  Active  Participation Quality:  Appropriate, Attentive, Sharing and Supportive  Affect:  Depressed  Cognitive:  Appropriate  Insight:  Lacking  Engagement in Group:  Engaged  Modes of Intervention:  Clarification, Education and Support  Summary of Progress/Problems:  Marcille BuffyMcNeil, Shaolin Armas S 07/26/2016, 12:18 AM

## 2016-07-26 NOTE — Progress Notes (Signed)
Recreation Therapy Notes  Date: 07/26/16 Time: 0930 Location: [address] Dayroom  Group Topic: Stress Management  Goal Area(s) Addresses:  Patient will verbalize importance of using healthy stress management.  Patient will identify positive emotions associated with healthy stress management.   Intervention: Stress Management  Activity :  Peaceful Waves.  LRT introduced to the stress management technique of guided imagery.  LRT read a script to engage patients in the activity.  Patients were to follow along as LRT read script to participate in activity.  Education:  Stress Management, Discharge Planning.   Education Outcome: Acknowledges edcuation/In group clarification offered/Needs additional education  Clinical Observations/Feedback: Pt did not attend group.    Christopher Giles, LRT/CTRS         Daja Shuping A 07/26/2016 12:23 PM 

## 2016-07-26 NOTE — Progress Notes (Signed)
[facility]  07/26/2016 1:01 PM  Patient Active Problem List   Diagnosis Date Noted  . MDD (major depressive disorder), recurrent severe, without psychosis (HCC) 07/16/2016  . Moderate episode of recurrent major depressive disorder (HCC)   . Severe episode of recurrent major depressive disorder, without psychotic features (HCC)     Diagnosis: Major depressive disorder severe recurrent  Subjective: Patient did complete his list of action items that he could do if he feels depressed once he is back in his home. He identifies 8 items starting with taking a walk around the block and also items such as calling his sister, going to the bookstore, going to the park, and talking to a neighbor and going to a meeting either NA or AA. He discussed the importance of having a specific list and patient was given positive feedback for completing this exercise. We discussed some other strategies for moving on from depression. Patient feels his current medications are working well. He still does experience a lot of tendency to ruminate at night and he discussed options for possibly attending some therapy to learn some tools for CBT techniques for dealing with excessive ruminations after discharge. Patient and I also discussed the fact that he should be looking for a job when he feels able to do so and we also discussed the possibility of doing some volunteer work.  Objective: Well-developed well-nourished man in no apparent distress who is pleasant and appropriate thought processes are linear and goal-directed thought content denies current suicidal or homicidal ideation, plan or intent, speech and motor within normal limits, alert and oriented 3, insight and judgment are fair, IQ appears an average range   Current Facility-Administered Medications (Endocrine & Metabolic):  .  levothyroxine (SYNTHROID, LEVOTHROID) tablet 75 mcg       Current Facility-Administered Medications (Analgesics):  .   acetaminophen (TYLENOL) tablet 650 mg     Current Facility-Administered Medications (Other):  .  alum & mag hydroxide-simeth (MAALOX/MYLANTA) 200-200-20 MG/5ML suspension 30 mL .  cholecalciferol (VITAMIN D) tablet 400 Units .  clonazePAM (KLONOPIN) tablet 0.5 mg .  magnesium hydroxide (MILK OF MAGNESIA) suspension 30 mL .  nicotine (NICODERM CQ - dosed in mg/24 hours) patch 21 mg .  pantoprazole (PROTONIX) EC tablet 20 mg .  sertraline (ZOLOFT) tablet 100 mg .  traZODone (DESYREL) tablet 25 mg  No current outpatient prescriptions on file.  Vital Signs:Blood pressure 108/70, pulse 83, temperature 97.7 F (36.5 C), temperature source Oral, resp. rate 16, height 6\' 4"  (1.93 m), weight 97.5 kg (215 lb), SpO2 97 %.    Lab Results: No results found for this or any previous visit (from the past 48 hour(s)).  Physical Findings: AIMS: Facial and Oral Movements Muscles of Facial Expression: None, normal Lips and Perioral Area: None, normal Jaw: None, normal Tongue: None, normal,Extremity Movements Upper (arms, wrists, hands, fingers): None, normal Lower (legs, knees, ankles, toes): None, normal, Trunk Movements Neck, shoulders, hips: None, normal, Overall Severity Severity of abnormal movements (highest score from questions above): None, normal Incapacitation due to abnormal movements: None, normal Patient's awareness of abnormal movements (rate only patient's report): No Awareness, Dental Status Current problems with teeth and/or dentures?: No Does patient usually wear dentures?: No  CIWA:    COWS:  COWS Total Score: 0   Assessment/Plan: Patient who was here for almost a month and then stayed out only a few days before having to return for ongoing depression and suicidal ideation. Sherral HammersRobbins identified were the need  to adjust his medication, and also that he had not developed any resources for dealing with being back in the community. Patient has been stabilized on his current  medications and appears to be doing well, patient has also been working on developing action plan and other resources to continue with depression treatment after release. At this time he appears to be making good progress and the plan is for him to be discharged Wednesday if present course continuous.  Acquanetta SitElizabeth Woods Huber Mathers, MD 07/26/2016, 1:01 PM

## 2016-07-26 NOTE — Progress Notes (Signed)
D: [name] is pleasant, cooperative, and appropriate. He's been in his room for much of the morning but is friendly and polite upon approach. He denied SI, HI, AVH, and pain. He rated his level of depression 5/10, anxiety 3/10, and hopelessness 4/10. He wants to work on staying positive today.  A: Meds given as ordered. Q15 safety checks maintained. Support/encouragement offered. R: Pt remains free from harm and continues with treatment. Will continue to monitor for needs/safety.

## 2016-07-26 NOTE — [facility] Suicide Risk Assessment (Signed)
[facility] INPATIENT:  Family/Significant Other Suicide Prevention Education  Suicide Prevention Education:  Education Completed; sister Veva Holesnn Aldridge [phone],  (name of family member/significant other) has been identified by the patient as the family member/significant other with whom the patient will be residing, and identified as the person(s) who will aid the patient in the event of a mental health crisis (suicidal ideations/suicide attempt).  With written consent from the patient, the family member/significant other has been provided the following suicide prevention education, prior to the and/or following the discharge of the patient.  The suicide prevention education provided includes the following:  Suicide risk factors  Suicide prevention and interventions  National Suicide Hotline telephone number  [facility] assessment telephone number  [place] [facility] Emergency Assistance 911  [facility] and/or [facility] Crisis [facility] telephone number  Request made of family/significant other to:  Remove weapons (e.g., guns, rifles, knives), all items previously/currently identified as safety concern.    Remove drugs/medications (over-the-counter, prescriptions, illicit drugs), all items previously/currently identified as a safety concern.  The family member/significant other verbalizes understanding of the suicide prevention education information provided.  The family member/significant other agrees to remove the items of safety concern listed above.  Christopher Giles 07/26/2016, 5:16 PM

## 2016-07-26 NOTE — [facility] Group Notes (Signed)
[facility] LCSW Group Therapy Note  Date/Time: 07/26/2016   1:30PM  Type of Therapy and Topic:  Group Therapy:  Holding on to Grudges  Participation Level: Active     Description of Group:    In this group patients will be asked to explore and define a grudge.  Patients will be guided to discuss their thoughts, feelings, and behaviors as to why one holds on to grudges and reasons why people have grudges. Patients will process the impact grudges have on daily life and identify thoughts and feelings related to holding on to grudges. Facilitator will challenge patients to identify ways of letting go of grudges and the benefits once released.  Patients will be confronted to address why one struggles letting go of grudges. Lastly, patients will identify feelings and thoughts related to what life would look like without grudges.  This group will be process-oriented, with patients participating in exploration of their own experiences as well as giving and receiving support and challenge from other group members.  Therapeutic Goals: 1. Patient will identify specific grudges related to their personal life. 2. Patient will identify feelings, thoughts, and beliefs around grudges. 3. Patient will identify how one releases grudges appropriately. 4. Patient will identify situations where they could have let go of the grudge, but instead chose to hold on.  Summary of Patient Progress  Patient discussed that he is learning to be less critical and negative towards himself.    Therapeutic Modalities:   Cognitive Behavioral Therapy Solution Focused Therapy Motivational Interviewing Brief Therapy   Samuella BruinKristin Hope Holst, LCSW Clinical Social Worker [facility] [phone]

## 2016-07-26 NOTE — Plan of Care (Signed)
Problem: Activity: Goal: Sleeping patterns will improve Outcome: Progressing Pt reports "good" sleep.

## 2016-07-26 NOTE — Progress Notes (Signed)
Adult Psychoeducational Group Note  Date:  07/26/2016 Time:  10:30 PM  Group Topic/Focus:  Wrap-Up Group:   The focus of this group is to help patients review their daily goal of treatment and discuss progress on daily workbooks.   Participation Level:  Active  Participation Quality:  Appropriate  Affect:  Appropriate  Cognitive:  Alert  Insight: Appropriate  Engagement in Group:  Engaged  Modes of Intervention:  Discussion  Additional Comments:  Patient states, "I had a good day".  Detrice Cales L Annalisa Colonna 07/26/2016, 10:30 PM

## 2016-07-26 NOTE — Plan of Care (Signed)
Problem: Activity: Goal: Sleeping patterns will improve Outcome: Progressing Pt reports sleep as "good."  Problem: Safety: Goal: Periods of time without injury will increase Outcome: Adequate for Discharge Pt has not harmed self since admission.

## 2016-07-27 NOTE — Plan of Care (Signed)
Problem: Role Relationship: Goal: Ability to demonstrate positive changes in social behaviors and relationships will improve Outcome: Progressing Pt seen playing cards in the dayrom this evening with peers

## 2016-07-27 NOTE — Progress Notes (Signed)
D: Patient up and visible in the milieu. Spoke with patient 1:1. Rates sleep as good, appetite as fair, energy as low and concentration as poor. Patient's affect anxious with pleasant and appropriate mood. Rating depression at a 5/10, hopelessness at a 3/10 and anxiety at a 3/10. States goal for today is to "stay up, shave and ask staff for help." Denies pain, physical problems.   A: Medicated per orders, no prns needed, requested. Emotional support offered and self inventory reviewed. Encouraged completion of Suicide Safety Plan. Discussed POC with MD, SW.   R: Patient verbalizes understanding of POC.  Patient denies SI/HI and remains safe on level III obs. Plan is for discharge tomorrow.

## 2016-07-27 NOTE — [facility] Suicide Risk Assessment (Signed)
[facility]   Principal Problem: MDD (major depressive disorder), recurrent severe, without psychosis (HCC) Discharge Diagnoses:  Patient Active Problem List   Diagnosis Date Noted  . MDD (major depressive disorder), recurrent severe, without psychosis (HCC) [F33.2] 07/16/2016  . Moderate episode of recurrent major depressive disorder (HCC) [F33.1]   . Severe episode of recurrent major depressive disorder, without psychotic features (HCC) [F33.2]     Total Time spent with patient: 15 minutes  Musculoskeletal: Strength & Muscle Tone: within normal limits Gait & Station: normal Patient leans: N/A  Psychiatric Specialty Exam: ROS  Blood pressure 113/63, pulse 81, temperature 97.7 F (36.5 C), temperature source Oral, resp. rate 16, height 6\' 4"  (1.93 m), weight 97.5 kg (215 lb), SpO2 97 %.Body mass index is 26.17 kg/m.  General Appearance: Casual  Eye Contact::  Good  Speech:  Clear and Coherent  Volume:  Normal  Mood:  Euthymic  Affect:  Congruent  Thought Process:  Coherent  Orientation:  Full (Time, Place, and Person)  Thought Content:  Negative  Suicidal Thoughts:  No  Homicidal Thoughts:  No  Memory:  Negative  Judgement:  Fair  Insight:  Fair  Psychomotor Activity:  Normal  Concentration:  Good  Recall:  Good  Fund of Knowledge:Good  Language: Good  Akathisia:  No  Handed:  Right  AIMS (if indicated):     Assets:  Resilience  Sleep:  Number of Hours: 6.75  Cognition: WNL  ADL's:  Intact   Mental Status Per Nursing Assessment::   On Admission:  Self-harm thoughts  Demographic Factors:  Male, Caucasian, Living alone and Unemployed  Loss Factors: Decrease in vocational status and Loss of significant relationship  Historical Factors: Prior suicide attempts  Risk Reduction Factors:   Sense of responsibility to family and Positive coping skills or problem solving skills  Continued Clinical Symptoms:  Dysthymia  Cognitive  Features That Contribute To Risk:  None    Suicide Risk:  Mild:  Suicidal ideation of limited frequency, intensity, duration, and specificity.  There are no identifiable plans, no associated intent, mild dysphoria and related symptoms, good self-control (both objective and subjective assessment), few other risk factors, and identifiable protective factors, including available and accessible social support.  Follow-up Information    [facility] Follow up.   Specialty:  Behavioral Health Why:  Please go to walk-in clinic within 3 business days of discharge Monday-Friday at 8am for assessment for medication management services. Intial assessment may take several hours to complete. Please follow up with the Transitional Care Team as needed.  Contact informationElpidio Eric: [address] [place] [address] [phone]        [facility] Follow up on 07/30/2016.   Why:  Therapy appt on Friday Nov. 17th at 1pm with Alimah. Call office if you need to reschedule.  Contact information: [facility] [address] [place], [place] [address] [phone]          Plan Of Care/Follow-up recommendations:  Other:  Patient denies suicidal or homicidal ideation, plan or intent at time of discharge. he is discharged to outpatient follow-up and is encouraged to use his action plan for dealing with depressive symptoms and keep his outpatient appointments.  Acquanetta SitElizabeth Woods Lauralynn Loeb, MD 07/27/2016, 3:05 PM

## 2016-07-27 NOTE — Progress Notes (Signed)
Recreation Therapy Notes  Animal-Assisted Activity (AAA) Program Checklist/Progress Notes Patient Eligibility Criteria Checklist & Daily Group note for Rec TxIntervention  Date: 11.14.2017 Time: 2:45pm [facility]  AAA/T Program Assumption of Risk Form signed by Patient/ or Parent Legal Guardian Yes  Patient is free of allergies or sever asthma Yes  Patient reports no fear of animals Yes  Patient reports no history of cruelty to animals Yes  Patient understands his/her participation is voluntary Yes  Behavioral Response: Did not attend.   Marykay Lexenise Giles Lauraann Missey, LRT/CTRS        Jearl KlinefelterBlanchfield, Christopher Giles 07/27/2016 2:58 PM

## 2016-07-27 NOTE — Progress Notes (Signed)
Patient attended AA group meeting tonight.  

## 2016-07-27 NOTE — [facility] Group Notes (Signed)
[facility] LCSW Group Therapy  07/27/2016   1:15 PM   Type of Therapy:  Group Therapy  Participation Level:  Active  Participation Quality:  Attentive, Sharing and Supportive  Affect:  Appropriate  Cognitive:  Alert and Oriented  Insight:  Developing/Improving and Engaged  Engagement in Therapy:  Developing/Improving and Engaged  Modes of Intervention:  Clarification, Confrontation, Discussion, Education, Exploration, Limit-setting, Orientation, Problem-solving, Rapport Building, Dance movement psychotherapisteality Testing, Socialization and Support  Summary of Progress/Problems: The topic for group therapy was feelings about diagnosis.  Pt actively participated in group discussion on their past and current diagnosis and how they feel towards this.  Pt also identified how society and family members judge them, based on their diagnosis as well as stereotypes and stigmas.  Patient engaged in therapeutic writing activity, expressing motivation to overcome his depression.   Samuella BruinKristin Jenner Rosier, MSW, LCSW Clinical Social Worker [facility] [phone]

## 2016-07-27 NOTE — [facility] Group Notes (Signed)
The focus of this group is to educate the patient on the purpose and policies of crisis stabilization and provide a format to answer questions about their admission.  The group details unit policies and expectations of patients while admitted.  Patient did not attend 0900 nurse education orientation group this morning.  Patient stayed in bed.   

## 2016-07-27 NOTE — Progress Notes (Signed)
[facility]  07/27/2016 1:58 PM  Patient Active Problem List   Diagnosis Date Noted  . MDD (major depressive disorder), recurrent severe, without psychosis (HCC) 07/16/2016  . Moderate episode of recurrent major depressive disorder (HCC)   . Severe episode of recurrent major depressive disorder, without psychotic features (HCC)     Diagnosis: Major depressive disorder, severe  Subjective: Patient reports his mood is good today and he denies any suicidal or homicidal ideation, plan or intent. He states he feels positive about the planned discharge tomorrow.  Objective: Well developed well nourished male in no apparent distress pleasant and appropriate, mood is described as good and affect is congruent thought processes linear and goal-directed thought content denies any current suicidal or homicidal ideation, plan or intent alert and oriented 3 IQ appears an average range insight and judgment are fair   Current Facility-Administered Medications (Endocrine & Metabolic):  .  levothyroxine (SYNTHROID, LEVOTHROID) tablet 75 mcg       Current Facility-Administered Medications (Analgesics):  .  acetaminophen (TYLENOL) tablet 650 mg     Current Facility-Administered Medications (Other):  .  alum & mag hydroxide-simeth (MAALOX/MYLANTA) 200-200-20 MG/5ML suspension 30 mL .  cholecalciferol (VITAMIN D) tablet 400 Units .  clonazePAM (KLONOPIN) tablet 0.5 mg .  magnesium hydroxide (MILK OF MAGNESIA) suspension 30 mL .  nicotine (NICODERM CQ - dosed in mg/24 hours) patch 21 mg .  pantoprazole (PROTONIX) EC tablet 20 mg .  sertraline (ZOLOFT) tablet 100 mg .  traZODone (DESYREL) tablet 25 mg  No current outpatient prescriptions on file.  Vital Signs:Blood pressure 113/63, pulse 81, temperature 97.7 F (36.5 C), temperature source Oral, resp. rate 16, height 6\' 4"  (1.93 m), weight 97.5 kg (215 lb), SpO2 97 %.    Lab Results: No results found for this or any previous visit  (from the past 48 hour(s)).  Physical Findings: AIMS: Facial and Oral Movements Muscles of Facial Expression: None, normal Lips and Perioral Area: None, normal Jaw: None, normal Tongue: None, normal,Extremity Movements Upper (arms, wrists, hands, fingers): None, normal Lower (legs, knees, ankles, toes): None, normal, Trunk Movements Neck, shoulders, hips: None, normal, Overall Severity Severity of abnormal movements (highest score from questions above): None, normal Incapacitation due to abnormal movements: None, normal Patient's awareness of abnormal movements (rate only patient's report): No Awareness, Dental Status Current problems with teeth and/or dentures?: No Does patient usually wear dentures?: No  CIWA:    COWS:  COWS Total Score: 0   Assessment/Plan: Patient has worked hard on his issues and being compliant with medications and reports that mood is improved and he currently feels he is able to discharge successfully tomorrow.  Acquanetta SitElizabeth Woods Drewey Begue, MD 07/27/2016, 1:58 PM

## 2016-07-27 NOTE — Progress Notes (Signed)
D: Pt denies SI/HI/AVH. Pt is pleasant and cooperative. Pt seen interacting and playing cards with peers in the dayroom this evening. Pt stated he has been feeling and doing better, pt stated " when I drink I start to smoke crack." Pt stated he needs to get back to his NA and AA meetings.   A: Pt was offered support and encouragement. Pt was given scheduled medications. Pt was encourage to attend groups. Q 15 minute checks were done for safety.   R:Pt attends groups and interacts well with peers and staff. Pt is taking medication. Pt has no complaints at this time .Pt receptive to treatment and safety maintained on unit.

## 2016-07-27 NOTE — Plan of Care (Signed)
Problem: Education: Goal: Verbalization of understanding the information provided will improve Outcome: Progressing Patient verbalizes understanding of information and education provided.  Problem: Safety: Goal: Ability to disclose and discuss suicidal ideas will improve Outcome: Progressing Patient denies SI, thoughts to self harm.

## 2016-07-28 MED ORDER — LEVOTHYROXINE SODIUM 75 MCG PO TABS: 75.0000 ug | ORAL_TABLET | Freq: Every day | ORAL | 0 refills | Status: DC

## 2016-07-28 MED ORDER — SERTRALINE HCL 100 MG PO TABS: 100.0000 mg | ORAL_TABLET | Freq: Every day | ORAL | 0 refills | Status: DC

## 2016-07-28 MED ORDER — PANTOPRAZOLE SODIUM 20 MG PO TBEC: 20.0000 mg | DELAYED_RELEASE_TABLET | Freq: Every day | ORAL | 0 refills | Status: DC

## 2016-07-28 MED ORDER — CLONAZEPAM 0.5 MG PO TABS: 0.5000 mg | ORAL_TABLET | Freq: Two times a day (BID) | ORAL | 0 refills | Status: DC

## 2016-07-28 MED ORDER — TRAZODONE HCL 50 MG PO TABS: 25.0000 mg | ORAL_TABLET | Freq: Every evening | ORAL | 0 refills | Status: DC | PRN

## 2016-07-28 MED ORDER — NICOTINE 21 MG/24HR TD PT24: 21.0000 mg | MEDICATED_PATCH | Freq: Every day | TRANSDERMAL | 0 refills | Status: DC

## 2016-07-28 NOTE — Tx Team (Signed)
Interdisciplinary Treatment and Diagnostic Plan Update  07/28/2016 Time of Session: 9:30am Christopher Christopher Giles C Christopher Giles MRN: [id number]  Principal Diagnosis: MDD (major depressive disorder), recurrent severe, without psychosis (HCC)  Secondary Diagnoses: Principal Problem:   MDD (major depressive disorder), recurrent severe, without psychosis (HCC)   Current Medications:  Current Facility-Administered Medications  Medication Dose Route Frequency Provider Last Rate Last Dose  . acetaminophen (TYLENOL) tablet 650 mg  650 mg Oral Q6H PRN Charm RingsJamison Y Lord, NP   650 mg at 07/22/16 2138  . alum & mag hydroxide-simeth (MAALOX/MYLANTA) 200-200-20 MG/5ML suspension 30 mL  30 mL Oral Q4H PRN Charm RingsJamison Y Lord, NP   30 mL at 07/19/16 1641  . cholecalciferol (VITAMIN D) tablet 400 Units  400 Units Oral BID Acquanetta SitElizabeth Woods Oates, MD   400 Units at 07/27/16 2000  . clonazePAM (KLONOPIN) tablet 0.5 mg  0.5 mg Oral BID Acquanetta SitElizabeth Woods Oates, MD   0.5 mg at 07/27/16 1657  . levothyroxine (SYNTHROID, LEVOTHROID) tablet 75 mcg  75 mcg Oral QAC breakfast Charm RingsJamison Y Lord, NP   75 mcg at 07/28/16 [id number]  . magnesium hydroxide (MILK OF MAGNESIA) suspension 30 mL  30 mL Oral Daily PRN Charm RingsJamison Y Lord, NP      . nicotine (NICODERM CQ - dosed in mg/24 hours) patch 21 mg  21 mg Transdermal Q0600 Acquanetta SitElizabeth Woods Oates, MD   21 mg at 07/27/16 0758  . pantoprazole (PROTONIX) EC tablet 20 mg  20 mg Oral Daily Adonis BrookSheila Agustin, NP   20 mg at 07/27/16 0758  . sertraline (ZOLOFT) tablet 100 mg  100 mg Oral Daily Acquanetta SitElizabeth Woods Oates, MD   100 mg at 07/27/16 0757  . traZODone (DESYREL) tablet 25 mg  25 mg Oral QHS PRN Acquanetta SitElizabeth Woods Oates, MD   25 mg at 07/27/16 2213   PTA Medications: Prescriptions Prior to Admission  Medication Sig Dispense Refill Last Dose  . buPROPion (WELLBUTRIN XL) 150 MG 24 hr tablet Take 1 tablet (150 mg total) by mouth daily. 30 tablet 0 07/14/2016 at Unknown time  . cholecalciferol (VITAMIN D) 1000 units tablet Take 1  tablet (1,000 Units total) by mouth daily. 30 tablet 0 07/14/2016 at Unknown time  . gabapentin (NEURONTIN) 300 MG capsule Take 2 capsules (600 mg total) by mouth 3 (three) times daily. 60 capsule 0 07/14/2016 at Unknown time  . hydrOXYzine (ATARAX/VISTARIL) 25 MG tablet Take 1 tablet (25 mg total) by mouth every 6 (six) hours as needed for anxiety. 30 tablet 0 07/14/2016 at Unknown time  . levothyroxine (SYNTHROID, LEVOTHROID) 75 MCG tablet Take 1 tablet (75 mcg total) by mouth daily before breakfast. 30 tablet 0 07/15/2016 at Unknown time  . lithium carbonate 150 MG capsule Take 3 capsules (450 mg total) by mouth at bedtime. 45 capsule 0 Past Week at Unknown time  . mirtazapine (REMERON) 30 MG tablet Take 1 tablet (30 mg total) by mouth at bedtime. 30 tablet 0 Past Week at Unknown time  . tamsulosin (FLOMAX) 0.4 MG CAPS capsule Take 1 capsule (0.4 mg total) by mouth at bedtime. 30 capsule 0 Past Week at Unknown time    Patient Stressors:    Patient Strengths: Average or above average intelligence General fund of knowledge Motivation for treatment/growth  Treatment Modalities: Medication Management, Group therapy, Case management,  1 to 1 session with clinician, Psychoeducation, Recreational therapy.   Physician Treatment Plan for Primary Diagnosis: MDD (major depressive disorder), recurrent severe, without psychosis (HCC) Long Term Goal(s): Improvement in symptoms so  as ready for discharge Improvement in symptoms so as ready for discharge   Short Term Goals: Ability to disclose and discuss suicidal ideas Ability to demonstrate self-control will improve Ability to identify changes in lifestyle to reduce recurrence of condition will improve  Medication Management: Evaluate patient's response, side effects, and tolerance of medication regimen.  Therapeutic Interventions: 1 to 1 sessions, Unit Group sessions and Medication administration.  Evaluation of Outcomes: Adequate for  Discharge   RN Treatment Plan for Primary Diagnosis: MDD (major depressive disorder), recurrent severe, without psychosis (HCC) Long Term Goal(s): Knowledge of disease and therapeutic regimen to maintain health will improve  Short Term Goals: Ability to remain free from injury will improve, Ability to disclose and discuss suicidal ideas, Ability to identify and develop effective coping behaviors will improve and Compliance with prescribed medications will improve  Medication Management: RN will administer medications as ordered by provider, will assess and evaluate patient's response and provide education to patient for prescribed medication. RN will report any adverse and/or side effects to prescribing provider.  Therapeutic Interventions: 1 on 1 counseling sessions, Psychoeducation, Medication administration, Evaluate responses to treatment, Monitor vital signs and CBGs as ordered, Perform/monitor CIWA, COWS, AIMS and Fall Risk screenings as ordered, Perform wound care treatments as ordered.  Evaluation of Outcomes: Adequate for Discharge   LCSW Treatment Plan for Primary Diagnosis: MDD (major depressive disorder), recurrent severe, without psychosis (HCC) Long Term Goal(s): Safe transition to appropriate next level of care at discharge, Engage patient in therapeutic group addressing interpersonal concerns.  Short Term Goals: Engage patient in aftercare planning with referrals and resources, Increase social support, Increase emotional regulation, Facilitate acceptance of mental health diagnosis and concerns, Identify triggers associated with mental health/substance abuse issues and Increase skills for wellness and recovery  Therapeutic Interventions: Assess for all discharge needs, 1 to 1 time with Social worker, Explore available resources and support systems, Assess for adequacy in community support network, Educate family and significant other(s) on suicide prevention, Complete Psychosocial  Assessment, Interpersonal group therapy.  Evaluation of Outcomes: Adequate for Discharge   Progress in Treatment :  Attending groups: Yes  Participating in groups: Yes  Taking medication as prescribed: Yes, MD continuing to assess for appropriate medication regimen  Toleration medication: Yes  Family/Significant other contact made: Yes, CSW has spoken with sister  Patient understands diagnosis: Yes  Discussing patient identified problems/goals with staff: Yes  Medical problems stabilized or resolved: Yes  Denies suicidal/homicidal ideation: Yes, denies  Issues/concerns per patient self-inventory: None reported  Other: N/A  New problem(s) identified: None reported at this time    New Short Term/Long Term Goal(s): None at this time    Discharge Plan or Barriers: Patient plans to return home to follow up with outpatient services.     Reason for Continuation of Hospitalization: Anxiety Depression Medication stabilization Withdrawal symptoms  Estimated Length of Stay: Discharge anticipated for today 07/28/16    Attendees:  Patient:   Physician: Dr. Randa Evensates, Cob[place] , MD  07/28/2016   9:30am  Nursing: Doloris HallMarian Friendman, Midge AverJane Ochieng, Waynetta SandyJan Wright, RN 07/28/2016 9:30am  RN Care Manager: Onnie BoerJennifer Clark, CM  07/28/2016 9:30am  Social Workers: Chad CordialLauren Carter, LCSW, Samuella BruinKristin Nyquan Selbe, LCSW, Heather Smart, LCSW   07/28/2016 9:30am  Nurse Pratictioners: Lorelee MarketMay Augustin, Conrad Withrow, NP 07/28/2016 9:30am  Other:     Scribe for Treatment Team: Samuella BruinKristin Morayma Godown, LCSW Clinical Social Worker [facility] [phone]

## 2016-07-28 NOTE — Progress Notes (Signed)
Recreation Therapy Notes  Date: 07/28/16 Time: 0930 Location: [address] Day[address]  Group Topic: Stress Management  Goal Area(s) Addresses:  Patient will verbalize importance of using healthy stress management.  Patient will identify positive emotions associated with healthy stress management.   Intervention: Calm App  Activity :  Resilience Meditation.  LRT introduced the stress management technique of meditation.  LRT played a meditation to help patients improve resilience.  Patients were to follow along with the recording to the best of their ability to engaged in the technique.  Education:  Stress Management, Discharge Planning.   Education Outcome: Acknowledges edcuation/In group clarification offered/Needs additional education  Clinical Observations/Feedback: Pt did not attend group.    Jennier Schissler, LRT/CTRS         Khalen Styer A 07/28/2016 12:11 PM 

## 2016-07-28 NOTE — Progress Notes (Signed)
Pt discharged home with his sister. Pt was ambulatory stable and appreciative at that time. All papers and prescriptions were given and valuables returned. Verbal understanding expressed. Denies SI/HI and A/VH. Pt given opportunity to express concerns and ask questions.

## 2016-07-28 NOTE — Discharge Summary (Signed)
Physician Discharge Summary Note  Patient:  Christopher Giles is an 50 y.o., male MRN:  [id number] DOB:  03/30/1966 Patient phone:  [phone] (home)  Patient address:   [address] [place] [address],  Total Time spent with patient: 45 minutes  Date of Admission:  07/16/2016 Date of Discharge: 07/28/16  Reason for Admission:   Patient discharged from our behavioral health facility after staying 06/13/16-07/06/16. He reports ongoing depression since release and stated that last Thursday which would've been 07/08/16 he took 90 Tylenol PMs in a suicide attempt but reports he threw them up. He called 911 with complaints of suicidal ideation yesterday. He reports that he return to his apartment to find it moldy and felt lonely  after being released. 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 Patient presents with prescriptions for gabapentin, Wellbutrin, Remeron, and lithium but reports the medications are not helpful and says "thank God" when I suggest that we rationalize his medications at this time.  Patient reports that he is having ongoing suicidal ideation but states he does not feel he would act immediately and could contact staff if his feelings worsened. He denies any homicidal ideation, plan or intent. He denies any psychotic symptoms.  Patient also identifies financial stressors and may be facing bankruptcy and/or eviction. He reports that he has tried Zoloft in the past and found it worked the best for him for about 10 years along with low-dose Klonopin when necessary. He reports however about a year ago it seemed that the medication was no longer working at a dose of  150 mg and he switched Cymbalta with mixed results. He was being followed by his primary care physician. 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 His mother did pass away the patient thinks from old age in October 2017.  Patient states that looking back on it he thinks he started feeling depressed around 50 years of age and first had treatment about age 50. Per chart he has had at least 6 prior suicide attempts, has had ECT and has [facility] and also in this hospital in October 2017. Patient denies any current drug or alcohol use. He states that he did use marijuana cocaine and alcohol but has had no use for the past 11 years.  Principal Problem: MDD (major depressive disorder), recurrent severe, without psychosis [facility](HCC) Discharge Diagnoses: Patient Active Problem List   Diagnosis Date Noted  . Severe episode of recurrent major depressive disorder, without psychotic features (HCC) [F33.2]     Priority: High  . MDD (major depressive disorder), recurrent severe, without psychosis (HCC) [F33.2] 07/16/2016  . Moderate episode of recurrent major depressive disorder (HCC) [F33.1]     Past Psychiatric History: See H&P  Past Medical History:  Past Medical History:  Diagnosis Date  . Anxiety   . Depression   . Substance abuse   . Suicide attempt    x 6 per pt  . Thyroid disease    hypo    Past Surgical History:  Procedure Laterality Date  . punctured lung  Family History:  Family History  Problem Relation Age of Onset  . Hyperlipidemia Mother   . Cancer Mother   . Cancer Father   . Diabetes Maternal Grandmother    Family Psychiatric  History: See H&P Social History:  History  Alcohol Use No     History  Drug Use No    Social History   Social History  . Marital status: Single    Spouse name: N/A  . Number of children: N/A  . Years of education: N/A   Social History Main Topics  . Smoking  status: Current Every Day Smoker    Packs/day: 1.00    Types: Cigarettes  . Smokeless tobacco: Never Used  . Alcohol use No  . Drug use: No  . Sexual activity: No   Other Topics Concern  . None   Social History Narrative  . None    Hospital Course:   Christopher Giles was admitted for MDD (major depressive disorder), recurrent severe, without psychosis (HCC) , with psychosis and crisis management.  Pt was treated discharged with the medications listed below under Medication List. Medical problems were identified and treated as needed.  Home medications were restarted as appropriate.   Improvement was monitored by observation and Christopher Giles 's daily report of symptom reduction.  Emotional and mental status was monitored by daily self-inventory reports completed by Christopher Giles and clinical staff.         Christopher Giles was evaluated by the treatment team for stability and plans for continued recovery upon discharge. Christopher Giles 's motivation was an integral factor for scheduling further treatment. Employment, transportation, bed availability, health status, family support, and any pending legal issues were also considered during hospital stay. Pt was offered further treatment options upon discharge including but not limited to Residential, Intensive Outpatient, and Outpatient treatment.  Christopher Giles will follow up with the services as listed below under Follow Up Information.     Upon completion of this admission the patient was both mentally and medically stable for discharge denying suicidal/homicidal ideation, auditory/visual/tactile hallucinations, delusional thoughts and paranoia.    Christopher Giles responded well to treatment with clonazepam, synthroid, protonix, zoloft, nicotine, and trazodone without adverse effects. Pt demonstrated improvement without reported or observed adverse effects to the point of stability appropriate for outpatient management.  Reviewed CBC, CMP, BAL,  and UDS; all unremarkable aside from noted exceptions.   Physical Findings: AIMS: Facial and Oral Movements Muscles of Facial Expression: None, normal Lips and Perioral Area: None, normal Jaw: None, normal Tongue: None, normal,Extremity Movements Upper (arms, wrists, hands, fingers): None, normal Lower (legs, knees, ankles, toes): None, normal, Trunk Movements Neck, shoulders, hips: None, normal, Overall Severity Severity of abnormal movements (highest score from questions above): None, normal Incapacitation due to abnormal movements: None, normal Patient's awareness of abnormal movements (rate only patient's report): No Awareness, Dental Status Current problems with teeth and/or dentures?: No Does patient usually wear dentures?: No  CIWA:    COWS:  COWS Total Score: 0  Musculoskeletal: Strength & Muscle Tone: within normal limits Gait & Station: normal Patient leans: N/A  Psychiatric Specialty Exam: Physical Exam  Review of Systems  Psychiatric/Behavioral: Positive for depression. Negative for hallucinations, substance abuse and suicidal ideas. The patient is nervous/anxious and has insomnia.   All other systems reviewed and are negative.   Blood pressure 100/66, pulse 74, temperature 98.2 F (36.8 C), temperature source Oral, resp. rate 16, height 6\' 4"  (  1.93 m), weight 97.5 kg (215 lb), SpO2 97 %.Body mass index is 26.17 kg/m.  SEE MD PSE WITHIN SRA   Have you used any form of tobacco in the last 30 days? (Cigarettes, Smokeless Tobacco, Cigars, and/or Pipes): Yes  Has this patient used any form of tobacco in the last 30 days? (Cigarettes, Smokeless Tobacco, Cigars, and/or Pipes) Yes, Yes, A prescription for an FDA-approved tobacco cessation medication was offered at discharge and the patient refused  Blood Alcohol level:  Lab Results  Component Value Date   ETH <5 07/15/2016   ETH <5 07/09/2016    Metabolic Disorder Labs:  No results found for: HGBA1C, MPG No results  found for: PROLACTIN No results found for: CHOL, TRIG, HDL, CHOLHDL, VLDL, LDLCALC  See Psychiatric Specialty Exam and Suicide Risk Assessment completed by Attending Physician prior to discharge.  Discharge destination:  Home  Is patient on multiple antipsychotic therapies at discharge:  No   Has Patient had three or more failed trials of antipsychotic monotherapy by history:  No  Recommended Plan for Multiple Antipsychotic Therapies: NA     Medication List    STOP taking these medications   buPROPion 150 MG 24 hr tablet Commonly known as:  WELLBUTRIN XL   gabapentin 300 MG capsule Commonly known as:  NEURONTIN   hydrOXYzine 25 MG tablet Commonly known as:  ATARAX/VISTARIL   lithium carbonate 150 MG capsule   mirtazapine 30 MG tablet Commonly known as:  REMERON   tamsulosin 0.4 MG Caps capsule Commonly known as:  FLOMAX     TAKE these medications     Indication  cholecalciferol 1000 units tablet Commonly known as:  VITAMIN D Take 1 tablet (1,000 Units total) by mouth daily.  Indication:  Vitamin D Deficiency   clonazePAM 0.5 MG tablet Commonly known as:  KLONOPIN Take 1 tablet (0.5 mg total) by mouth 2 (two) times daily.  Indication:  severe anxiety   levothyroxine 75 MCG tablet Commonly known as:  SYNTHROID, LEVOTHROID Take 1 tablet (75 mcg total) by mouth daily before breakfast.  Indication:  Underactive Thyroid, Cancer of Thyroid   nicotine 21 mg/24hr patch Commonly known as:  NICODERM CQ - dosed in mg/24 hours Place 1 patch (21 mg total) onto the skin daily at 6 (six) AM. Start taking on:  07/29/2016  Indication:  Nicotine Addiction   pantoprazole 20 MG tablet Commonly known as:  PROTONIX Take 1 tablet (20 mg total) by mouth daily. Start taking on:  07/29/2016  Indication:  Gastroesophageal Reflux Disease   sertraline 100 MG tablet Commonly known as:  ZOLOFT Take 1 tablet (100 mg total) by mouth daily. Start taking on:  07/29/2016  Indication:   Major Depressive Disorder   traZODone 50 MG tablet Commonly known as:  DESYREL Take 0.5 tablets (25 mg total) by mouth at bedtime as needed for sleep.  Indication:  Trouble Sleeping      Follow-up Information    [facility] Follow up.   Specialty:  Behavioral Health Why:  Please go to walk-in clinic within 3 business days of discharge Monday-Friday at 8am for assessment for medication management services. Intial assessment may take several hours to complete. Please follow up with the Transitional Care Team as needed.  Contact [address] [place] [address] [phone]        [facility] Follow up on 07/30/2016.   Why:  Therapy appt on Friday Nov. 17th at 1pm with Alimah. Call office  if you need to reschedule.  Contact information: [facility] [address] [place], [place] [address] [phone]          Follow-up recommendations:  Activity:  As tolerated Diet:  Heart healthy with low sodium  Comments:   Take all medications as prescribed. Keep all follow-up appointments as scheduled.  Do not consume alcohol or use illegal drugs while on prescription medications. Report any adverse effects from your medications to your primary care provider promptly.  In the event of recurrent symptoms or worsening symptoms, call 911, a crisis hotline, or go to the nearest emergency department for evaluation.    Signed: Beau Fanny, [name] 07/28/2016, 9:22 AM

## 2016-07-28 NOTE — Progress Notes (Signed)
Pt reports he has had a good day.  He states that he is supposed to discharge on Wednesday to go home.  He denies SI/HI/AVH.  He voiced no needs or concerns for this evening.  He states he intends to make the changes he needs to stay out of the hospital. He states, "it's a new day; day one".  Support and encouragement offered.  Pt pleasant and cooperative with staff.  Discharge plans are in process.  Safety maintained with q15 minute checks.

## 2016-07-28 NOTE — Progress Notes (Signed)
Written/verbal discharge instructions, AVS, SRA, Transition Report, Follow-up appointments and Prescriptions reviewed with patient with verbalization of understanding; patient denies suicidal and homicidal ideation.  Information of suicide prevention given to patient with verbalization of understanding.  Patient states his sister will come after 2pm today to pick him up.

## 2016-07-28 NOTE — Progress Notes (Signed)
  [facility] Discharge [facility] :  Will you be returning to the same living situation after discharge:  Yes,  patient plans to return home At discharge, do you have transportation home?: Yes,  sister to pick up Do you have the ability to pay for your medications: Yes,  patient will be provided with prescriptions at discharge  Release of information consent forms completed and in the chart;  Patient's signature needed at discharge.  Patient to Follow up at: Follow-up Information    [facility] Follow up.   Specialty:  Behavioral Health Why:  Please go to walk-in clinic within 3 business days of discharge Monday-Friday at 8am for assessment for medication management services. Intial assessment may take several hours to complete. Please follow up with the Transitional Care Team as needed.  Contact informationElpidio Eric: [address] [place] [address] [phone]        [facility] Follow up on 07/30/2016.   Why:  Therapy appt on Friday Nov. 17th at 1pm with Alimah. Call office if you need to reschedule.  Contact information: [facility] [address] [place], [place] [address] [phone]          Next level of care provider has access to [facility]: No  Safety Planning and Suicide Prevention discussed: Yes,  with patient and sister  Have you used any form of tobacco in the last 30 days? (Cigarettes, Smokeless Tobacco, Cigars, and/or Pipes): Yes  Has patient been referred to the Quitline?: Patient refused referral  Patient has been referred for addiction treatment: Yes  Tajah Noguchi L Andalyn Heckstall 07/28/2016, 8:28 AM

## 2016-11-17 ENCOUNTER — Emergency Department (HOSPITAL_COMMUNITY)
Admission: EM | Admit: 2016-11-17 | Discharge: 2016-11-17 | Disposition: A | Discharge status: Home / Self Care | Payer: Self-pay | Attending: Emergency Medicine | Admitting: Emergency Medicine

## 2016-11-17 ENCOUNTER — Encounter (HOSPITAL_COMMUNITY): Payer: Self-pay

## 2016-11-17 DIAGNOSIS — F17210 Nicotine dependence, cigarettes, uncomplicated: Secondary | ICD-10-CM | POA: Insufficient documentation

## 2016-11-17 DIAGNOSIS — Z79899 Other long term (current) drug therapy: Secondary | ICD-10-CM | POA: Insufficient documentation

## 2016-11-17 DIAGNOSIS — F22 Delusional disorders: Secondary | ICD-10-CM | POA: Insufficient documentation

## 2016-11-17 DIAGNOSIS — F329 Major depressive disorder, single episode, unspecified: Secondary | ICD-10-CM | POA: Insufficient documentation

## 2016-11-17 DIAGNOSIS — E039 Hypothyroidism, unspecified: Secondary | ICD-10-CM | POA: Insufficient documentation

## 2016-11-17 LAB — RAPID URINE DRUG SCREEN, HOSP PERFORMED
AMPHETAMINES: NOT DETECTED
BARBITURATES: NOT DETECTED
BENZODIAZEPINES: NOT DETECTED
Cocaine: NOT DETECTED
Opiates: POSITIVE — AB
Tetrahydrocannabinol: NOT DETECTED

## 2016-11-17 LAB — CBC
HCT: 43.7 % (ref 39.0–52.0)
Hemoglobin: 15.3 g/dL (ref 13.0–17.0)
MCH: 30.7 pg (ref 26.0–34.0)
MCHC: 35 g/dL (ref 30.0–36.0)
MCV: 87.8 fL (ref 78.0–100.0)
Platelets: 224 10*3/uL (ref 150–400)
RBC: 4.98 MIL/uL (ref 4.22–5.81)
RDW: 12.4 % (ref 11.5–15.5)
WBC: 7.5 10*3/uL (ref 4.0–10.5)

## 2016-11-17 LAB — BASIC METABOLIC PANEL
Anion gap: 9 (ref 5–15)
BUN: 8 mg/dL (ref 6–20)
CALCIUM: 9.5 mg/dL (ref 8.9–10.3)
CHLORIDE: 103 mmol/L (ref 101–111)
CO2: 24 mmol/L (ref 22–32)
CREATININE: 0.87 mg/dL (ref 0.61–1.24)
GFR calc non Af Amer: >60 mL/min (ref >60)
Glucose, Bld: 110 mg/dL — ABNORMAL HIGH (ref 65–99)
Potassium: 3.9 mmol/L (ref 3.5–5.1)
SODIUM: 136 mmol/L (ref 135–145)

## 2016-11-17 LAB — URINALYSIS, ROUTINE W REFLEX MICROSCOPIC
Bilirubin Urine: NEGATIVE
GLUCOSE, UA: NEGATIVE mg/dL
HGB URINE DIPSTICK: NEGATIVE
KETONES UR: NEGATIVE mg/dL
LEUKOCYTES UA: NEGATIVE
Nitrite: NEGATIVE
PROTEIN: NEGATIVE mg/dL
Specific Gravity, Urine: 1.012 (ref 1.005–1.030)
pH: 7 (ref 5.0–8.0)

## 2016-11-17 LAB — TSH: TSH: 1.046 u[IU]/mL (ref 0.350–4.500)

## 2016-11-17 LAB — CBG MONITORING, ED: Glucose-Capillary: 121 mg/dL — ABNORMAL HIGH (ref 65–99)

## 2016-11-17 LAB — T4, FREE: FREE T4: 1.22 ng/dL — AB (ref 0.61–1.12)

## 2016-11-17 LAB — ETHANOL: Alcohol, Ethyl (B): <5 mg/dL

## 2016-11-17 MED ORDER — LORAZEPAM 2 MG/ML IJ SOLN
1.0000 mg | Freq: Once | INTRAMUSCULAR | Status: AC
  Administered 2016-11-17: 1 mg via INTRAVENOUS
  Filled 2016-11-17: qty 1

## 2016-11-17 NOTE — ED Provider Notes (Signed)
[facility]-EMERGENCY DEPT Provider Note   CSN: [id number] Arrival date & time: 11/17/16  [id number]     History   Chief Complaint Chief Complaint  Patient presents with  . Weakness  . Anxiety    HPI Christopher Giles is a 51 y.o. male.  HPI  Patient presents with depression, anxiety, paranoid thoughts or Patient acknowledges a history of depression and anxiety, states that over the past months he has had increasing amounts of his anxiolytics, antidepressants. However, over the past few days, possibly 1 week, patient has had increasing sense of depression, weakness, dysphoric mood. He also has new.  Thoughts of his neighbors watching him, others watching him, monitoring his behavior. No overt suicidal ideation. No new physical pain, but the patient does describe generalized discomfort, weakness.   Past Medical History:  Diagnosis Date  . Anxiety   . Depression   . Substance abuse   . Suicide attempt    x 6 per pt  . Thyroid disease    hypo    Patient Active Problem List   Diagnosis Date Noted  . MDD (major depressive disorder), recurrent severe, without psychosis (HCC) 07/16/2016  . Moderate episode of recurrent major depressive disorder (HCC)   . Severe episode of recurrent major depressive disorder, without psychotic features (HCC)     Past Surgical History:  Procedure Laterality Date  . punctured lung         Home Medications    Prior to Admission medications   Medication Sig Start Date End Date Taking? Authorizing Provider  cholecalciferol (VITAMIN D) 1000 units tablet Take 1 tablet (1,000 Units total) by mouth daily. 07/07/16   Truman Haywardakia S Starkes, [name]  clonazePAM (KLONOPIN) 0.5 MG tablet Take 1 tablet (0.5 mg total) by mouth 2 (two) times daily. 07/28/16   Beau FannyJohn C Withrow, [name]  levothyroxine (SYNTHROID, LEVOTHROID) 75 MCG tablet Take 1 tablet (75 mcg total) by mouth daily before breakfast. 07/28/16   Beau FannyJohn C Withrow, [name]  nicotine (NICODERM CQ - DOSED IN MG/24 HOURS) 21  mg/24hr patch Place 1 patch (21 mg total) onto the skin daily at 6 (six) AM. 07/29/16   Beau FannyJohn C Withrow, [name]  pantoprazole (PROTONIX) 20 MG tablet Take 1 tablet (20 mg total) by mouth daily. 07/29/16   Beau FannyJohn C Withrow, [name]  sertraline (ZOLOFT) 100 MG tablet Take 1 tablet (100 mg total) by mouth daily. 07/29/16   Beau FannyJohn C Withrow, [name]  traZODone (DESYREL) 50 MG tablet Take 0.5 tablets (25 mg total) by mouth at bedtime as needed for sleep. 07/28/16   Beau FannyJohn C Withrow, [name]    Family History Family History  Problem Relation Age of Onset  . Hyperlipidemia Mother   . Cancer Mother   . Cancer Father   . Diabetes Maternal Grandmother     Social History Social History  Substance Use Topics  . Smoking status: Current Every Day Smoker    Packs/day: 1.00    Types: Cigarettes  . Smokeless tobacco: Never Used  . Alcohol use No     Allergies   Patient has no known allergies.   Review of Systems Review of Systems  Constitutional:       Per HPI, otherwise negative  HENT:       Per HPI, otherwise negative  Respiratory:       Per HPI, otherwise negative  Cardiovascular:       Per HPI, otherwise negative  Gastrointestinal: Negative for vomiting.  Endocrine:       Negative aside  from HPI  Genitourinary:       Neg aside from HPI   Musculoskeletal:       Per HPI, otherwise negative  Skin: Negative.   Neurological: Positive for weakness. Negative for syncope.  Psychiatric/Behavioral: Positive for decreased concentration, dysphoric mood, hallucinations and sleep disturbance. The patient is nervous/anxious.      Physical Exam Updated Vital Signs BP 102/68 (BP Location: Left Arm)   Pulse 72   Temp 97.5 F (36.4 C) (Oral)   Resp 18   SpO2 100%   Physical Exam  Constitutional: He is oriented to person, place, and time. He appears well-developed. No distress.  HENT:  Head: Normocephalic and atraumatic.  Eyes: Conjunctivae and EOM are normal.  Cardiovascular: Normal rate and regular  rhythm.   Pulmonary/Chest: Effort normal. No stridor. No respiratory distress.  Abdominal: He exhibits no distension.  Musculoskeletal: He exhibits no edema.  Neurological: He is alert and oriented to person, place, and time.  Skin: Skin is warm and dry.  Psychiatric: His speech is delayed. He is slowed and withdrawn. Thought content is paranoid and delusional. Cognition and memory are not impaired. He exhibits a depressed mood.  Nursing note and vitals reviewed.    ED Treatments / Results  Labs (all labs ordered are listed, but only abnormal results are displayed) Labs Reviewed  BASIC METABOLIC PANEL - Abnormal; Notable for the following:       Result Value   Glucose, Bld 110 (*)    All other components within normal limits  CBG MONITORING, ED - Abnormal; Notable for the following:    Glucose-Capillary 121 (*)    All other components within normal limits  CBC  URINALYSIS, ROUTINE W REFLEX MICROSCOPIC    EKG  EKG Interpretation  Date/Time:  Wednesday November 17 2016 10:03:40 EST Ventricular Rate:  73 PR Interval:    QRS Duration: 70 QT Interval:  371 QTC Calculation: 409 R Axis:   68 Text Interpretation:  Sinus rhythm Probable anteroseptal infarct, old ST-t wave abnormality Artifact Abnormal ekg Confirmed by Gerhard Munch  MD [phone]) on 11/17/2016 11:17:33 AM       Procedures Procedures (including critical care time)  Medications Ordered in ED Ativan  Initial Impression / Assessment and Plan / ED Course  I have reviewed the triage vital signs and the nursing notes.  Pertinent labs & imaging results that were available during my care of the patient were reviewed by me and considered in my medical decision making (see chart for details).  This patient with a history of history of depression, anxiety presents with paranoid delusions, worsening depression, despondency. Patient is awake and alert, with no evidence for new acute physical changes, no evidence for new  organic combination to his worsening psychiatric symptoms. Patient requires psychiatric evaluation.  3:27 PM Labs Reassuring, reviewed with patient. I discussed this case with our behavioral health team. He said the patient has a psychiatrist and he will follow-up to address medication changes. Patient confirms this on repeat exam. Patient with history of depression presents with despondency, but no suicidal ideation. Patient also has some paranoid delusions, but no hallucinations, exhibits no evidence of danger to himself, nor others. Patient has been evaluated by our behavioral health team, and given that he has a psychiatrist and follow-up as an outpatient, his temperature for discharge. No other organic cause for his symptoms identified, no evidence for ACS, PE, infection. Patient discharged in stable condition.   Final Clinical Impressions(s) / ED Diagnoses  Despondency  Paranoid delusions   Gerhard Munch, MD 11/17/16 [phone]

## 2016-11-17 NOTE — ED Notes (Signed)
EKG given to Dr. Lockwood. 

## 2016-11-17 NOTE — BH Assessment (Addendum)
Assessment Note  Christopher Giles is a 51 y.o. male. He denies SI. He says he presented to ED b/c "I feel like my body is giving out. I'm very confused and paranoid, very weak. I do the smallest things and it really winds me. I also have stomach issues." Pt shares that the onset of these symptoms was @ 2 weeks ago and pt is unable to identify any trigger. Pt says he's been having paranoid symptoms. Examples he gave were: he will walk lightly so no one will hear him; someone was working on the apartment next door and he felt like the person could be looking at him; he would look out of his blinds and be worried that someone was looking back at him. Pt reports VH of mumbling voices intermittently. No command voices. Pt denies HI.    Diagnosis: GAD  Past Medical History:  Past Medical History:  Diagnosis Date  . Anxiety   . Depression   . Substance abuse   . Suicide attempt    x 6 per pt  . Thyroid disease    hypo    Past Surgical History:  Procedure Laterality Date  . punctured lung      Family History:  Family History  Problem Relation Age of Onset  . Hyperlipidemia Mother   . Cancer Mother   . Cancer Father   . Diabetes Maternal Grandmother     Social History:  reports that he has been smoking Cigarettes.  He has been smoking about 1.00 pack per day. He has never used smokeless tobacco. He reports that he does not drink alcohol or use drugs.  Additional Social History:  Alcohol / Drug Use Pain Medications: see PTA meds Prescriptions: see PTA meds Over the Counter: see PTA meds History of alcohol / drug use?: Yes (hx of ETOH, cocaine and marijuana abuse) Longest period of sobriety (when/how long): Pt has been sober for 11 years  CIWA: CIWA-Ar BP: 139/76 Pulse Rate: 65 COWS:    Allergies: No Known Allergies  Home Medications:  (Not in a hospital admission)  OB/GYN Status:  No LMP for male patient.  General Assessment Data Location of Assessment: [facility] TTS  Assessment: In system Is this a Tele or Face-to-Face Assessment?: Face-to-Face Is this an Initial Assessment or a Re-assessment for this encounter?: Initial Assessment Marital status: Single Living Arrangements: Alone Can pt return to current living arrangement?: Yes Admission Status: Voluntary Is patient capable of signing voluntary admission?: Yes Referral Source: Self/Family/Friend     Crisis Care Plan Living Arrangements: Alone Name of Psychiatrist: [facility] [place] Name of Therapist: [facility]  Education Status Is patient currently in school?: No  Risk to self with the past 6 months Suicidal Ideation: No Has patient been a risk to self within the past 6 months prior to admission? : No Suicidal Intent: No Has patient had any suicidal intent within the past 6 months prior to admission? : No Is patient at risk for suicide?: No Suicidal Plan?: No Has patient had any suicidal plan within the past 6 months prior to admission? : No Access to Means: No Previous Attempts/Gestures: Yes How many times?: 7 Triggers for Past Attempts: Unknown, Unpredictable Intentional Self Injurious Behavior: None Family Suicide History: Unknown Recent stressful life event(s): Other (Comment) (see narrative) Persecutory voices/beliefs?: No Depression: Yes Substance abuse history and/or treatment for substance abuse?: Yes Suicide prevention information given to non-admitted patients: Not applicable  Risk to Others within  the past 6 months Homicidal Ideation: No Does patient have any lifetime risk of violence toward others beyond the six months prior to admission? : No Thoughts of Harm to Others: No Current Homicidal Intent: No Current Homicidal Plan: No Access to Homicidal Means: No History of harm to others?: No Assessment of Violence: None Noted Does patient have access to weapons?: No Criminal Charges Pending?: No Does patient have a court date:  No Is patient on probation?: No  Psychosis Hallucinations: None noted Delusions: None noted  Mental Status Report Appearance/Hygiene: Unremarkable Eye Contact: Fair Motor Activity: Unremarkable Speech: Logical/coherent Level of Consciousness: Alert Mood: Anxious, Sad Affect: Appropriate to circumstance Anxiety Level: Minimal Thought Processes: Coherent, Relevant Judgement: Unimpaired Orientation: Situation, Time, Place, Person, Appropriate for developmental age Obsessive Compulsive Thoughts/Behaviors: None  Cognitive Functioning Concentration: Normal Memory: Recent Intact, Remote Intact IQ: Average Insight: Fair Impulse Control: Fair Appetite: Fair Sleep: No Change Total Hours of Sleep: 6 Vegetative Symptoms: None  ADLScreening [facility]) Patient's cognitive ability adequate to safely complete daily activities?: Yes Patient able to express need for assistance with ADLs?: Yes Independently performs ADLs?: Yes (appropriate for developmental age)  Prior Inpatient Therapy Prior Inpatient Therapy: Yes Prior Therapy Dates: 06/2016; 07/2016 [facility] Reason for Treatment: Depression  Prior Outpatient Therapy Prior Outpatient Therapy: Yes Does patient have an ACCT team?: No Does patient have Intensive In-House Services?  : No Does patient have [facility] services? : No Does patient have P4CC services?: No  ADL Screening (condition at time of admission) Patient's cognitive ability adequate to safely complete daily activities?: Yes Is the patient deaf or have difficulty hearing?: No Does the patient have difficulty seeing, even when wearing glasses/contacts?: No Does the patient have difficulty concentrating, remembering, or making decisions?: No Patient able to express need for assistance with ADLs?: Yes Does the patient have difficulty dressing or bathing?: No Independently performs ADLs?: Yes (appropriate for  developmental age) Does the patient have difficulty walking or climbing stairs?: No Weakness of Legs: None Weakness of Arms/Hands: None  Home Assistive Devices/Equipment Home Assistive Devices/Equipment: None  Therapy Consults (therapy consults require a physician order) PT Evaluation Needed: No OT Evalulation Needed: No SLP Evaluation Needed: No Abuse/Neglect Assessment (Assessment to be complete while patient is alone) Physical Abuse: Denies Verbal Abuse: Denies Sexual Abuse: Denies Exploitation of patient/patient's resources: Denies Self-Neglect: Denies Values / Beliefs Cultural Requests During Hospitalization: None Spiritual Requests During Hospitalization: None Consults Spiritual Care Consult Needed: No Social Work Consult Needed: No Merchant navy officerAdvance Directives (For Healthcare) Does Patient Have a Medical Advance Directive?: No Would patient like information on creating a medical advance directive?: No - Patient declined    Additional Information 1:1 In Past 12 Months?: No CIRT Risk: No Elopement Risk: No Does patient have medical clearance?: No     Disposition:  Disposition Initial Assessment Completed for this Encounter: Yes (consulted with Nanine MeansJamison Lord, DNP) Disposition of Patient: Other dispositions Other disposition(s): To current provider [facility]  On Site Evaluation by:   Reviewed with Physician:    Laddie AquasSamantha M Hodari Chuba 11/17/2016 12:58 PM

## 2016-11-17 NOTE — ED Notes (Signed)
Patient was alert, oriented and stable upon discharge. RN went over AVS and patient had no further questions.  Pt stated that he would f/u with his regular therapist.

## 2016-11-17 NOTE — ED Triage Notes (Signed)
Pt with multiple complaints.  Anxiety, paranoia, weakness, depression, abdominal pain, back pain, shortness of breath.  X 2 weeks.  No fever.

## 2016-11-17 NOTE — Discharge Instructions (Signed)
As discussed, your evaluation today has been largely reassuring.  But, it is important that you monitor your condition carefully, and do not hesitate to return to the ED if you develop new, or concerning changes in your condition. ? ?Otherwise, please follow-up with your physician for appropriate ongoing care. ? ?

## 2017-10-27 ENCOUNTER — Ambulatory Visit (HOSPITAL_COMMUNITY)
Admission: EM | Admit: 2017-10-27 | Discharge: 2017-10-27 | Disposition: A | Discharge status: Home / Self Care | Payer: Self-pay | Attending: Internal Medicine | Admitting: Internal Medicine

## 2017-10-27 ENCOUNTER — Other Ambulatory Visit: Payer: Self-pay

## 2017-10-27 ENCOUNTER — Encounter (HOSPITAL_COMMUNITY): Payer: Self-pay | Admitting: Emergency Medicine

## 2017-10-27 DIAGNOSIS — M5416 Radiculopathy, lumbar region: Secondary | ICD-10-CM

## 2017-10-27 DIAGNOSIS — R202 Paresthesia of skin: Secondary | ICD-10-CM

## 2017-10-27 LAB — POCT I-STAT, CHEM 8
BUN: 3 mg/dL — AB (ref 6–20)
CALCIUM ION: 1.05 mmol/L — AB (ref 1.15–1.40)
CHLORIDE: 97 mmol/L — AB (ref 101–111)
Creatinine, Ser: 0.7 mg/dL (ref 0.61–1.24)
GLUCOSE: 97 mg/dL (ref 65–99)
HCT: 43 % (ref 39.0–52.0)
Hemoglobin: 14.6 g/dL (ref 13.0–17.0)
Potassium: 3.9 mmol/L (ref 3.5–5.1)
SODIUM: 133 mmol/L — AB (ref 135–145)
TCO2: 25 mmol/L (ref 22–32)

## 2017-10-27 MED ORDER — PREDNISONE 10 MG (21) PO TBPK: ORAL_TABLET | Freq: Every day | ORAL | 0 refills | Status: DC

## 2017-10-27 NOTE — ED Provider Notes (Signed)
[facility]    CSN: [id number] Arrival date & time: 10/27/17  1243     History   Chief Complaint Chief Complaint  Patient presents with  . Numbness    HPI Christopher Giles is a 52 y.o. male.   [name] presents with complaints of right anterior thigh pain which has been ongoing for the past month. It has not worsened. He occasionally feels numbness to his right hand and bilateral feet. Denies currently. History of low back pain which is currently improved, denies back pain. Denies pain to thigh but states it is an "annoyance." he is ambulatory but feels he has to limp due to sensation. Without weakness. Laying on his right side seems to worsen the symptoms. He has not taken any medications for his symptoms. History of anxiety, depression, thyroid disease. He does take gabapentin. Last documented spine xray in 2013. Without diabetes.     ROS per HPI.       Past Medical History:  Diagnosis Date  . Anxiety   . Depression   . Substance abuse (HCC)   . Suicide attempt (HCC)    x 6 per pt  . Thyroid disease    hypo    Patient Active Problem List   Diagnosis Date Noted  . MDD (major depressive disorder), recurrent severe, without psychosis (HCC) 07/16/2016  . Moderate episode of recurrent major depressive disorder (HCC)   . Severe episode of recurrent major depressive disorder, without psychotic features (HCC)     Past Surgical History:  Procedure Laterality Date  . punctured lung         Home Medications    Prior to Admission medications   Medication Sig Start Date End Date Taking? Authorizing Provider  ATORVASTATIN CALCIUM PO Take by mouth.   Yes [provider]  gabapentin (NEURONTIN) 300 MG capsule Take 300 mg by mouth 2 (two) times daily.   Yes [provider]  levothyroxine (SYNTHROID, LEVOTHROID) 75 MCG tablet Take 1 tablet (75 mcg total) by mouth daily before breakfast. 07/28/16  Yes Withrow, Everardo All, [name]  sertraline (ZOLOFT) 100  MG tablet Take 1 tablet (100 mg total) by mouth daily. Patient taking differently: Take 200 mg by mouth every morning.  07/29/16  Yes Withrow, Everardo All, [name]  terazosin (HYTRIN) 1 MG capsule Take 1 mg by mouth at bedtime.   Yes [provider]  traZODone (DESYREL) 50 MG tablet Take 0.5 tablets (25 mg total) by mouth at bedtime as needed for sleep. Patient taking differently: Take 50 mg by mouth at bedtime as needed for sleep.  07/28/16  Yes Withrow, Everardo All, [name]  cholecalciferol (VITAMIN D) 1000 units tablet Take 1 tablet (1,000 Units total) by mouth daily. Patient taking differently: Take 1,000 Units by mouth every morning.  07/07/16   Truman Hayward, [name]  clonazePAM (KLONOPIN) 0.5 MG tablet Take 1 tablet (0.5 mg total) by mouth 2 (two) times daily. Patient not taking: Reported on 11/17/2016 07/28/16   Withrow, Everardo All, [name]  nicotine (NICODERM CQ - DOSED IN MG/24 HOURS) 21 mg/24hr patch Place 1 patch (21 mg total) onto the skin daily at 6 (six) AM. Patient not taking: Reported on 11/17/2016 07/29/16   Withrow, Everardo All, [name]  pantoprazole (PROTONIX) 20 MG tablet Take 1 tablet (20 mg total) by mouth daily. Patient not taking: Reported on 10/27/2017 07/29/16   Withrow, Everardo All, [name]  predniSONE (STERAPRED UNI-PAK 21 TAB) 10 MG (21) TBPK tablet Take by mouth daily. Take  6 tabs by mouth daily  for 2 days, then 5 tabs for 2 days, then 4 tabs for 2 days, then 3 tabs for 2 days, 2 tabs for 2 days, then 1 tab by mouth daily for 2 days 10/27/17   Georgetta Haber, NP    Family History Family History  Problem Relation Age of Onset  . Hyperlipidemia Mother   . Cancer Mother   . Cancer Father   . Diabetes Maternal Grandmother     Social History Social History   Tobacco Use  . Smoking status: Current Every Day Smoker    Packs/day: 1.00    Types: Cigarettes  . Smokeless tobacco: Never Used  Substance Use Topics  . Alcohol use: No  . Drug use: No     Allergies   Patient has no known  allergies.   Review of Systems Review of Systems   Physical Exam Triage Vital Signs ED Triage Vitals [10/27/17 1337]  Enc Vitals Group     BP 105/60     Pulse Rate 66     Resp 18     Temp 98.5 F (36.9 C)     Temp src      SpO2 99 %     Weight      Height      Head Circumference      Peak Flow      Pain Score      Pain Loc      Pain Edu?      Excl. in GC?    No data found.  Updated Vital Signs BP 105/60   Pulse 66   Temp 98.5 F (36.9 C)   Resp 18   SpO2 99%   Visual Acuity Right Eye Distance:   Left Eye Distance:   Bilateral Distance:    Right Eye Near:   Left Eye Near:    Bilateral Near:     Physical Exam  Constitutional: He is oriented to person, place, and time. He appears well-developed and well-nourished.  Cardiovascular: Normal rate and regular rhythm.  Pulmonary/Chest: Effort normal and breath sounds normal.  Musculoskeletal:       Right upper leg: He exhibits no tenderness, no bony tenderness, no swelling, no edema, no deformity and no laceration.       Legs: "numbness" sensation localized to right anterior thigh; gross sensation intact; ambulatory; strength equal bilaterally and distal reflexes intact; without low back pain on palpation; full ROM to right leg noted  Neurological: He is alert and oriented to person, place, and time. No cranial nerve deficit or sensory deficit. Coordination normal.  Skin: Skin is warm and dry.     UC Treatments / Results  Labs (all labs ordered are listed, but only abnormal results are displayed) Labs Reviewed  POCT I-STAT, CHEM 8 - Abnormal; Notable for the following components:      Result Value   Sodium 133 (*)    Chloride 97 (*)    BUN 3 (*)    Calcium, Ion 1.05 (*)    All other components within normal limits    EKG  EKG Interpretation None       Radiology No results found.  Procedures Procedures (including critical care time)  Medications Ordered in UC Medications - No data to  display   Initial Impression / Assessment and Plan / UC Course  I have reviewed the triage vital signs and the nursing notes.  Pertinent labs & imaging results that were available during  my care of the patient were reviewed by me and considered in my medical decision making (see chart for details).     Chem 8 without findings to contribute to numbness at this time. History of DDD, will treat as radiculopathy at this time. Symptoms for 1 month which have not worsened. Course of steroids initiated. Recommended close follow up with PCP as may need further evaluation and treatment. Return precautions provided. Patient verbalized understanding and agreeable to plan.  Ambulatory out of clinic without difficulty.    Final Clinical Impressions(s) / UC Diagnoses   Final diagnoses:  Lumbar radiculopathy  Paresthesias    ED Discharge Orders        Ordered    predniSONE (STERAPRED UNI-PAK 21 TAB) 10 MG (21) TBPK tablet  Daily     10/27/17 1559       Controlled Substance Prescriptions [place] Controlled Substance Registry consulted? Not Applicable   Georgetta HaberBurky, Natalie B, NP 10/27/17 1601

## 2017-10-27 NOTE — ED Triage Notes (Signed)
Pt c/o R thigh numbness x1 month, states hes had off and on R hand and feet numbness and tingling. The R thigh numbness has been constant x1 month. Pt has no neuro deficits. Grip strength equal. Ambulatory with steady gait.

## 2017-10-27 NOTE — Discharge Instructions (Addendum)
Please complete course of steroids as prescribed. Continue with gabapentin as previously prescribed. Please follow up with your primary care provider in the next 1-2 weeks for reevaluation as you may need further treatment.  If you develop worsening of pain, weakness, unsteady gate, incontinence of urine or stool or otherwise worsening of symptoms please go to Er.

## 2017-11-02 ENCOUNTER — Other Ambulatory Visit: Payer: Self-pay

## 2017-11-02 ENCOUNTER — Encounter (HOSPITAL_COMMUNITY): Payer: Self-pay | Admitting: Emergency Medicine

## 2017-11-02 ENCOUNTER — Ambulatory Visit (HOSPITAL_COMMUNITY)
Admission: EM | Admit: 2017-11-02 | Discharge: 2017-11-02 | Disposition: A | Discharge status: Home / Self Care | Payer: Self-pay | Attending: Family Medicine | Admitting: Family Medicine

## 2017-11-02 DIAGNOSIS — R202 Paresthesia of skin: Secondary | ICD-10-CM

## 2017-11-02 NOTE — ED Triage Notes (Signed)
Reports feeling dizziness yesterday.  Followed by anxiety worsening.  Patient is not feeling "normal".  Patient relates how he is feeling to the medication - prednisone.   Patient was seen 2/14 for intermittent numbness and tingling in extremities.  Reports medication did not help at all

## 2017-11-02 NOTE — ED Provider Notes (Signed)
[facility]    CSN: [id number] Arrival date & time: 11/02/17  1643     History   Chief Complaint Chief Complaint  Patient presents with  . Medication Reaction    HPI Christopher Giles is a 52 y.o. male history of anxiety, hypothyroid, depression presenting today with concern about dizziness and increased anxiety since starting the medicine as well as continued numbness on right thigh.  He was seen here approximately 6 days ago and started on prednisone to help with paresthesias.  States that he took 4 doses of the taper and yesterday began to feel dizzy and increased anxiety.  States he has a history of other mental health disorders and this has exacerbated it.  He has not felt himself since starting the prednisone.  He did not take his dose today.  He states he did not have any relief with the prednisone.  His dizziness is described as lightheadedness, states it has improved since yesterday.  He has had a numbness to the front of his thigh that is constant for approximately 1 month, has also had a sensation of needles in his right knee at night.  He has a history of degenerative disc disease from approximately L2-L5 based off MRI in 2013.  Patient denies weakness in legs or any difficulty walking.  He has chronic back pain.  He also has numbness and tingling in his hands and feet, but he does not have the sensation at time of visit.  Denies loss of bowel or bladder control, denies saddle anesthesia.  HPI  Past Medical History:  Diagnosis Date  . Anxiety   . Depression   . Substance abuse (HCC)   . Suicide attempt (HCC)    x 6 per pt  . Thyroid disease    hypo    Patient Active Problem List   Diagnosis Date Noted  . MDD (major depressive disorder), recurrent severe, without psychosis (HCC) 07/16/2016  . Moderate episode of recurrent major depressive disorder (HCC)   . Severe episode of recurrent major depressive disorder, without psychotic features (HCC)     Past  Surgical History:  Procedure Laterality Date  . punctured lung         Home Medications    Prior to Admission medications   Medication Sig Start Date End Date Taking? Authorizing Provider  Divalproex Sodium (DEPAKOTE PO) Take 1,000 mg by mouth once.   Yes [provider]  ATORVASTATIN CALCIUM PO Take by mouth.    [provider]  cholecalciferol (VITAMIN D) 1000 units tablet Take 1 tablet (1,000 Units total) by mouth daily. Patient taking differently: Take 1,000 Units by mouth every morning.  07/07/16   Truman Hayward, [name]  clonazePAM (KLONOPIN) 0.5 MG tablet Take 1 tablet (0.5 mg total) by mouth 2 (two) times daily. Patient not taking: Reported on 11/17/2016 07/28/16   Beau Fanny, [name]  gabapentin (NEURONTIN) 300 MG capsule Take 300 mg by mouth 2 (two) times daily.    [provider]  levothyroxine (SYNTHROID, LEVOTHROID) 75 MCG tablet Take 1 tablet (75 mcg total) by mouth daily before breakfast. 07/28/16   Withrow, Everardo All, [name]  nicotine (NICODERM CQ - DOSED IN MG/24 HOURS) 21 mg/24hr patch Place 1 patch (21 mg total) onto the skin daily at 6 (six) AM. Patient not taking: Reported on 11/17/2016 07/29/16   Withrow, Everardo All, [name]  pantoprazole (PROTONIX) 20 MG tablet Take 1 tablet (20 mg total) by mouth daily. Patient not taking: Reported  on 10/27/2017 07/29/16   Beau FannyWithrow, John C, [name]  sertraline (ZOLOFT) 100 MG tablet Take 1 tablet (100 mg total) by mouth daily. Patient taking differently: Take 200 mg by mouth every morning.  07/29/16   Withrow, Everardo AllJohn C, [name]  terazosin (HYTRIN) 1 MG capsule Take 1 mg by mouth at bedtime.    [provider]  traZODone (DESYREL) 50 MG tablet Take 0.5 tablets (25 mg total) by mouth at bedtime as needed for sleep. Patient taking differently: Take 50 mg by mouth at bedtime as needed for sleep.  07/28/16   Withrow, Everardo AllJohn C, [name]    Family History Family History  Problem Relation Age of Onset  . Hyperlipidemia Mother   .  Cancer Mother   . Cancer Father   . Diabetes Maternal Grandmother     Social History Social History   Tobacco Use  . Smoking status: Current Every Day Smoker    Packs/day: 1.00    Types: Cigarettes  . Smokeless tobacco: Never Used  Substance Use Topics  . Alcohol use: No  . Drug use: No     Allergies   Patient has no known allergies.   Review of Systems Review of Systems  Constitutional: Negative for chills and fever.  HENT: Negative for ear pain and sore throat.   Eyes: Negative for pain and visual disturbance.  Respiratory: Negative for cough and shortness of breath.   Cardiovascular: Negative for chest pain and palpitations.  Gastrointestinal: Negative for abdominal pain and vomiting.  Genitourinary: Negative for dysuria and hematuria.  Musculoskeletal: Negative for arthralgias and back pain.  Skin: Negative for color change and rash.  Neurological: Positive for dizziness, light-headedness and numbness. Negative for syncope and weakness.  Psychiatric/Behavioral:       Increased anxiety  All other systems reviewed and are negative.    Physical Exam Triage Vital Signs ED Triage Vitals [11/02/17 1741]  Enc Vitals Group     BP (!) 101/59     Pulse Rate 60     Resp 20     Temp 98.2 F (36.8 C)     Temp Source Oral     SpO2 99 %     Weight      Height      Head Circumference      Peak Flow      Pain Score      Pain Loc      Pain Edu?      Excl. in GC?    No data found.  Updated Vital Signs BP (!) 101/59 (BP Location: Left Arm) Comment (BP Location): large cuff  Pulse 60   Temp 98.2 F (36.8 C) (Oral)   Resp 20   SpO2 99%   Visual Acuity Right Eye Distance:   Left Eye Distance:   Bilateral Distance:    Right Eye Near:   Left Eye Near:    Bilateral Near:     Physical Exam  Constitutional: He appears well-developed and well-nourished.  HENT:  Head: Normocephalic and atraumatic.  Eyes: Conjunctivae and EOM are normal. Pupils are equal,  round, and reactive to light.  Neck: Neck supple.  Cardiovascular: Normal rate and regular rhythm.  No murmur heard. Pulmonary/Chest: Effort normal and breath sounds normal. No respiratory distress.  Breathing comfortably at rest, clear to auscultation bilateral  Abdominal: Soft. There is no tenderness.  Musculoskeletal: He exhibits no edema.  Right knee without any effusion, swelling or deformity.  Full range of motion.  Neurological: He is  alert.  Cranial nerves II through XII grossly intact, strength 5 out of 5 bilaterally at shoulders, hips, knee.  Normal finger to nose, rapid alternating movements, heel to shin.  Gait without abnormality.  Patellar reflexes 2+ bilaterally.  Sensation intact on anterior right thigh, although he states it does not feel 100% as the sensation on his left thigh.  Extends across entire anterior thigh.  Skin: Skin is warm and dry.  Psychiatric: He has a normal mood and affect.  Nursing note and vitals reviewed.    UC Treatments / Results  Labs (all labs ordered are listed, but only abnormal results are displayed) Labs Reviewed - No data to display  EKG  EKG Interpretation None       Radiology No results found.  Procedures Procedures (including critical care time)  Medications Ordered in UC Medications - No data to display   Initial Impression / Assessment and Plan / UC Course  I have reviewed the triage vital signs and the nursing notes.  Pertinent labs & imaging results that were available during my care of the patient were reviewed by me and considered in my medical decision making (see chart for details).     Advised patient to continue not taking the prednisone, dizziness should continue to resolve.  Anxiety may take a little bit longer to come back down.  Numbness does appear to be nerve related, whether from back/DDD or other source.  Advised to take Tylenol or ibuprofen.  Patient is already on gabapentin 300 twice daily.  Strength  good and equal bilaterally, no concern for stroke.  Follow-up with PCP for further evaluation.  Final Clinical Impressions(s) / UC Diagnoses   Final diagnoses:  Paresthesia of right lower extremity    ED Discharge Orders    None       Controlled Substance Prescriptions [place] Controlled Substance Registry consulted? Not Applicable   Christopher Giles, [place] 11/02/17 2058

## 2017-11-02 NOTE — Discharge Instructions (Signed)
Please stop the prednisone, I expect your side effects to resolve with time after stopping prednisone. Please return if not improving in 1 week.  The numbness could be related to your back. You may want to try tylenol or ibuprofen. Please follow up with primary care to further evaluate this.

## 2018-07-06 ENCOUNTER — Encounter (HOSPITAL_COMMUNITY): Payer: Self-pay

## 2018-07-06 ENCOUNTER — Ambulatory Visit (HOSPITAL_COMMUNITY)
Admission: EM | Admit: 2018-07-06 | Discharge: 2018-07-06 | Disposition: A | Discharge status: Home / Self Care | Payer: Self-pay | Attending: Family Medicine | Admitting: Family Medicine

## 2018-07-06 DIAGNOSIS — J020 Streptococcal pharyngitis: Secondary | ICD-10-CM

## 2018-07-06 LAB — POCT RAPID STREP A: STREPTOCOCCUS, GROUP A SCREEN (DIRECT): POSITIVE — AB

## 2018-07-06 MED ORDER — AMOXICILLIN 500 MG PO CAPS: 500.0000 mg | ORAL_CAPSULE | Freq: Two times a day (BID) | ORAL | 0 refills | Status: AC

## 2018-07-06 NOTE — Discharge Instructions (Signed)
Your rapid strep test was positive.  We will treat with antibiotics

## 2018-07-06 NOTE — ED Triage Notes (Signed)
Pt presents with ongoing sore throat. 

## 2018-07-07 ENCOUNTER — Encounter (HOSPITAL_COMMUNITY): Payer: Self-pay | Admitting: Family Medicine

## 2018-07-07 NOTE — ED Provider Notes (Signed)
[facility]    CSN: [id number] Arrival date & time: 07/06/18  1433     History   Chief Complaint Chief Complaint  Patient presents with  . Sore Throat    HPI Christopher Giles is a 52 y.o. male.    Sore Throat  This is a new problem. The current episode started more than 2 days ago. The problem occurs constantly. The problem has been gradually worsening. Pertinent negatives include no chest pain, no abdominal pain, no headaches and no shortness of breath. Associated symptoms comments: He reports low grade fever, body aches and chill. Denies hx of strep. Denies any associated URI symptoms. . The symptoms are aggravated by drinking and swallowing. He has tried nothing for the symptoms.    Past Medical History:  Diagnosis Date  . Anxiety   . Depression   . Substance abuse (HCC)   . Suicide attempt (HCC)    x 6 per pt  . Thyroid disease    hypo    Patient Active Problem List   Diagnosis Date Noted  . MDD (major depressive disorder), recurrent severe, without psychosis (HCC) 07/16/2016  . Moderate episode of recurrent major depressive disorder (HCC)   . Severe episode of recurrent major depressive disorder, without psychotic features (HCC)     Past Surgical History:  Procedure Laterality Date  . punctured lung         Home Medications    Prior to Admission medications   Medication Sig Start Date End Date Taking? Authorizing Provider  amoxicillin (AMOXIL) 500 MG capsule Take 1 capsule (500 mg total) by mouth 2 (two) times daily for 10 days. 07/06/18 07/16/18  Dahlia Byes A, NP  ATORVASTATIN CALCIUM PO Take by mouth.    [provider]  cholecalciferol (VITAMIN D) 1000 units tablet Take 1 tablet (1,000 Units total) by mouth daily. Patient taking differently: Take 1,000 Units by mouth every morning.  07/07/16   Starkes-Perry, Juel Burrow, [name]  clonazePAM (KLONOPIN) 0.5 MG tablet Take 1 tablet (0.5 mg total) by mouth 2 (two) times daily. Patient not  taking: Reported on 11/17/2016 07/28/16   Beau Fanny, [name]  Divalproex Sodium (DEPAKOTE PO) Take 1,000 mg by mouth once.    [provider]  gabapentin (NEURONTIN) 300 MG capsule Take 300 mg by mouth 2 (two) times daily.    [provider]  levothyroxine (SYNTHROID, LEVOTHROID) 75 MCG tablet Take 1 tablet (75 mcg total) by mouth daily before breakfast. 07/28/16   Withrow, Everardo All, [name]  nicotine (NICODERM CQ - DOSED IN MG/24 HOURS) 21 mg/24hr patch Place 1 patch (21 mg total) onto the skin daily at 6 (six) AM. Patient not taking: Reported on 11/17/2016 07/29/16   Withrow, Everardo All, [name]  pantoprazole (PROTONIX) 20 MG tablet Take 1 tablet (20 mg total) by mouth daily. Patient not taking: Reported on 10/27/2017 07/29/16   Beau Fanny, [name]  sertraline (ZOLOFT) 100 MG tablet Take 1 tablet (100 mg total) by mouth daily. Patient taking differently: Take 200 mg by mouth every morning.  07/29/16   Withrow, Everardo All, [name]  terazosin (HYTRIN) 1 MG capsule Take 1 mg by mouth at bedtime.    [provider]  traZODone (DESYREL) 50 MG tablet Take 0.5 tablets (25 mg total) by mouth at bedtime as needed for sleep. Patient taking differently: Take 50 mg by mouth at bedtime as needed for sleep.  07/28/16   Withrow, Everardo All, [name]    Family History Family  History  Problem Relation Age of Onset  . Hyperlipidemia Mother   . Cancer Mother   . Cancer Father   . Diabetes Maternal Grandmother     Social History Social History   Tobacco Use  . Smoking status: Current Every Day Smoker    Packs/day: 1.00    Types: Cigarettes  . Smokeless tobacco: Never Used  Substance Use Topics  . Alcohol use: No  . Drug use: No     Allergies   Patient has no known allergies.   Review of Systems Review of Systems  Constitutional: Positive for fever.  HENT: Positive for sore throat. Negative for congestion, postnasal drip, rhinorrhea and trouble swallowing.   Respiratory: Negative for cough,  choking and shortness of breath.   Cardiovascular: Negative for chest pain.  Gastrointestinal: Negative for abdominal pain.  Allergic/Immunologic: Negative for immunocompromised state.  Neurological: Negative for headaches.     Physical Exam Triage Vital Signs ED Triage Vitals  Enc Vitals Group     BP 07/06/18 1454 121/76     Pulse Rate 07/06/18 1454 83     Resp 07/06/18 1454 20     Temp 07/06/18 1454 98.6 F (37 C)     Temp Source 07/06/18 1454 Oral     SpO2 07/06/18 1454 96 %     Weight --      Height --      Head Circumference --      Peak Flow --      Pain Score 07/06/18 1456 8     Pain Loc --      Pain Edu? --      Excl. in GC? --    No data found.  Updated Vital Signs BP 121/76 (BP Location: Left Arm)   Pulse 83   Temp 98.6 F (37 C) (Oral)   Resp 20   SpO2 96%   Visual Acuity Right Eye Distance:   Left Eye Distance:   Bilateral Distance:    Right Eye Near:   Left Eye Near:    Bilateral Near:     Physical Exam  Constitutional: He appears well-developed and well-nourished.  Very pleasant. Non toxic or ill appearing.   HENT:  Head: Normocephalic and atraumatic.  Mouth/Throat: Tonsils are 2+ on the right. Tonsils are 2+ on the left. No tonsillar exudate.  Bilateral TMs normal.  External ears normal.  Moderate posterior oropharyngeal erythema with 2+ tonsillar swelling without exudates Mild tonsillar lymphadenopathy   Neck: Normal range of motion.  Cardiovascular: Normal rate, regular rhythm and normal heart sounds.  Pulmonary/Chest: Effort normal and breath sounds normal.  Lungs clear in all fields. No dyspnea or distress. No retractions or nasal flaring.   Neurological: He is alert.  Skin: Skin is warm and dry. No rash noted. He is not diaphoretic. No erythema. No pallor.  Psychiatric: He has a normal mood and affect.  Nursing note and vitals reviewed.    UC Treatments / Results  Labs (all labs ordered are listed, but only abnormal results  are displayed) Labs Reviewed  POCT RAPID STREP A - Abnormal; Notable for the following components:      Result Value   Streptococcus, Group A Screen (Direct) POSITIVE (*)    All other components within normal limits    EKG None  Radiology No results found.  Procedures Procedures (including critical care time)  Medications Ordered in UC Medications - No data to display  Initial Impression / Assessment and Plan / UC Course  I have reviewed the triage vital signs and the nursing notes.  Pertinent labs & imaging results that were available during my care of the patient were reviewed by me and considered in my medical decision making (see chart for details).     Rapid strep positive Will treat with amoxicillin 500 mg BID  Ibuprofen for pain Follow up as needed for continued or worsening symptoms  Final Clinical Impressions(s) / UC Diagnoses   Final diagnoses:  Strep pharyngitis     Discharge Instructions     Your rapid strep test was positive.  We will treat with antibiotics    ED Prescriptions    Medication Sig Dispense Auth. Provider   amoxicillin (AMOXIL) 500 MG capsule Take 1 capsule (500 mg total) by mouth 2 (two) times daily for 10 days. 20 capsule Dahlia Byes A, NP     Controlled Substance Prescriptions [place] Controlled Substance Registry consulted? Not Applicable   Janace Aris, NP 07/07/18 [phone]

## 2018-12-11 ENCOUNTER — Telehealth (INDEPENDENT_AMBULATORY_CARE_PROVIDER_SITE_OTHER): Payer: Self-pay | Admitting: Orthopaedic Surgery

## 2018-12-11 ENCOUNTER — Telehealth (INDEPENDENT_AMBULATORY_CARE_PROVIDER_SITE_OTHER): Payer: Self-pay

## 2018-12-11 NOTE — Telephone Encounter (Signed)
Called patient. No answer LMOM to return our call. If they return call, please ask them screening questions below. Thank you.   Do you have now or have you had in the past 7 days a fever and/or chills?   Do you have now or have you had in the past 7 days a cough?   Do you have now or have you had in the last 7 days nausea, vomiting or abdominal pain?   Have you been exposed to anyone who has tested positive for COVID-19?   Have you or anyone who lives with you traveled within the last month? 

## 2018-12-11 NOTE — Telephone Encounter (Signed)
Returned call to patient   Asked all the pre screening  Questions    Patient answered no to all questions

## 2018-12-12 ENCOUNTER — Encounter (INDEPENDENT_AMBULATORY_CARE_PROVIDER_SITE_OTHER): Payer: Self-pay | Admitting: Orthopaedic Surgery

## 2018-12-12 ENCOUNTER — Other Ambulatory Visit: Payer: Self-pay

## 2018-12-12 ENCOUNTER — Ambulatory Visit (INDEPENDENT_AMBULATORY_CARE_PROVIDER_SITE_OTHER): Payer: Worker's Compensation | Admitting: Orthopaedic Surgery

## 2018-12-12 DIAGNOSIS — S52125A Nondisplaced fracture of head of left radius, initial encounter for closed fracture: Secondary | ICD-10-CM | POA: Diagnosis not present

## 2018-12-12 NOTE — Progress Notes (Signed)
Office Visit Note   Patient: Christopher Giles           Date of Birth: 1966-04-24           MRN: [id number] Visit Date: 12/12/2018              Requested by: Dartha Lodge, [name] [address] [place], [place] [address] PCP: Dartha Lodge, [name]   Assessment & Plan: Visit Diagnoses:  1. Closed nondisplaced fracture of head of left radius, initial encounter     Plan: Impression is nondisplaced left radial head/neck junction fracture.  X-rays were reviewed today with the patient.  I would anticipate that this should heal well without any issues.  For now he is to continue with nonweightbearing.  I do want him to start with gentle range of motion.  Since he has been laid off from his job he does not need a work note.  We will see him back in 3 weeks with two-view x-rays of the left elbow. Total face to face encounter time was greater than 45 minutes and over half of this time was spent in counseling and/or coordination of care.  Follow-Up Instructions: Return in about 3 weeks (around 01/02/2019).   Orders:  No orders of the defined types were placed in this encounter.  No orders of the defined types were placed in this encounter.     Procedures: No procedures performed   Clinical Data: No additional findings.   Subjective: Chief Complaint  Patient presents with  . 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  He was evaluated at [facility] urgent care and x-rays demonstrated a nondisplaced radial head and neck fracture.  He is overall feeling slightly better.  He has been taking tramadol for the pain.  He has been wearing the sling.  He is unfortunately been laid off by his job since the downturn of the economy.   Review of Systems  Constitutional: Negative.   All other systems reviewed and are negative.    Objective:  Vital Signs: There were no vitals taken for this visit.  Physical Exam Vitals signs and nursing note reviewed.  Constitutional:      Appearance: He is well-developed.  HENT:     Head: Normocephalic and atraumatic.  Eyes:     Pupils: Pupils are equal, round, and reactive to light.  Neck:     Musculoskeletal: Neck supple.  Pulmonary:     Effort: Pulmonary effort is normal.  Abdominal:     Palpations: Abdomen is soft.  Musculoskeletal: Normal range of motion.  Skin:    General: Skin is warm.  Neurological:     Mental Status: He is alert and oriented to person, place, and time.  Psychiatric:        Behavior: Behavior normal.        Thought Content: Thought content normal.        Judgment: Judgment normal.     Ortho Exam Left elbow exam shows mild ecchymosis.  He does have mild limitation with elbow extension.  He has normal pronation supination and near normal elbow flexion.  He is tender to the radial head.  His wrist exam is essentially unremarkable. Specialty Comments:  No specialty comments available.  Imaging: No results found.   PMFS History: Patient Active Problem List  Diagnosis Date Noted  . MDD (major depressive disorder), recurrent severe, without psychosis (HCC) 07/16/2016  . Moderate episode of recurrent major depressive disorder (HCC)   . Severe episode of recurrent major depressive disorder, without psychotic features [facility])    Past Medical History:  Diagnosis Date  . Anxiety   . Depression   . Substance abuse (HCC)   . Suicide attempt (HCC)    x 6 per pt  . Thyroid disease    hypo    Family History  Problem Relation Age of Onset  . Hyperlipidemia Mother   . Cancer Mother   . Cancer Father   . Diabetes Maternal Grandmother     Past Surgical History:  Procedure Laterality Date  . punctured lung     Social History   Occupational History  . Not on file  Tobacco Use  . Smoking status: Current Every Day Smoker    Packs/day: 1.00    Types:  Cigarettes  . Smokeless tobacco: Never Used  Substance and Sexual Activity  . Alcohol use: No  . Drug use: No  . Sexual activity: Never    Birth control/protection: Abstinence

## 2018-12-18 ENCOUNTER — Telehealth (INDEPENDENT_AMBULATORY_CARE_PROVIDER_SITE_OTHER): Payer: Self-pay

## 2018-12-18 NOTE — Telephone Encounter (Signed)
Faxed the 12/12/18 office note to adj per her request

## 2019-01-02 ENCOUNTER — Ambulatory Visit (INDEPENDENT_AMBULATORY_CARE_PROVIDER_SITE_OTHER): Payer: Worker's Compensation | Admitting: Orthopaedic Surgery

## 2019-01-02 ENCOUNTER — Encounter (INDEPENDENT_AMBULATORY_CARE_PROVIDER_SITE_OTHER): Payer: Self-pay | Admitting: Orthopaedic Surgery

## 2019-01-02 ENCOUNTER — Other Ambulatory Visit: Payer: Self-pay

## 2019-01-02 ENCOUNTER — Ambulatory Visit (INDEPENDENT_AMBULATORY_CARE_PROVIDER_SITE_OTHER): Payer: Worker's Compensation

## 2019-01-02 VITALS — Ht 76.0 in | Wt 244.0 lb

## 2019-01-02 DIAGNOSIS — S52125D Nondisplaced fracture of head of left radius, subsequent encounter for closed fracture with routine healing: Secondary | ICD-10-CM

## 2019-01-02 NOTE — Progress Notes (Signed)
Office Visit Note   Patient: Christopher Giles           Date of Birth: [birth year]           MRN: [id number] Visit Date: 01/02/2019              Requested by: Christopher Lodge, [name] [address] [place], [place] [address] PCP: Christopher Lodge, [name]   Assessment & Plan: Visit Diagnoses:  1. Closed nondisplaced fracture of head of left radius with routine healing, subsequent encounter     Plan: Impression is 5 weeks status post left radial head neck fracture.  His x-rays demonstrate some slight resorption of the fracture although clinically he is doing quite well.  At this point I am going to enroll him and OT for range of motion and gentle strengthening.  I am going to limit him to nonweightbearing still until I see more healing of the fracture on x-ray.  I would like to recheck in 4 weeks with 4 view x-rays of the left elbow.  Follow-Up Instructions: Return in about 4 weeks (around 01/30/2019).   Orders:  Orders Placed This Encounter  Procedures  . XR Elbow 2 Views Left   No orders of the defined types were placed in this encounter.     Procedures: No procedures performed   Clinical Data: No additional findings.   Subjective: Chief Complaint  Patient presents with  . Left Elbow - Follow-up, Fracture    Christopher Giles is a 53 year old male who comes in for his left radial neck fracture that he sustained on the job 5 weeks ago.  In the meantime he was furloughed from his job due to the coronavirus.  He states that overall his left elbow is feeling better.  He does not have any significant discomfort or pain.   Review of Systems  Constitutional: Negative.   All other systems reviewed and are negative.    Objective: Vital Signs: Ht 6\' 4"  (1.93 m)   Wt 244 lb (110.7 kg)   BMI 29.70 kg/m   Physical Exam Vitals signs and nursing note reviewed.  Constitutional:      Appearance: He is well-developed.  Pulmonary:     Effort: Pulmonary effort is normal.  Abdominal:      Palpations: Abdomen is soft.  Skin:    General: Skin is warm.  Neurological:     Mental Status: He is alert and oriented to person, place, and time.  Psychiatric:        Behavior: Behavior normal.        Thought Content: Thought content normal.        Judgment: Judgment normal.     Ortho Exam Left elbow exam shows normal elbow flexion and 5 to 10 degree flexion contracture.  He has normal supination and he lacks 5 degrees of full pronation.  He does not have any tenderness of the radial head or neck.  No swelling. Specialty Comments:  No specialty comments available.  Imaging: Xr Elbow 2 Views Left  Result Date: 01/02/2019 No interval changes in alignment of the fracture.  There is some resorption around the fracture site.    PMFS History: Patient Active Problem List   Diagnosis Date Noted  . MDD (major depressive disorder), recurrent severe, without psychosis (HCC) 07/16/2016  . Moderate episode of recurrent major depressive disorder (HCC)   . Severe episode of recurrent major depressive disorder, without psychotic features [facility])    Past Medical History:  Diagnosis Date  .  Anxiety   . Depression   . Substance abuse (HCC)   . Suicide attempt (HCC)    x 6 per pt  . Thyroid disease    hypo    Family History  Problem Relation Age of Onset  . Hyperlipidemia Mother   . Cancer Mother   . Cancer Father   . Diabetes Maternal Grandmother     Past Surgical History:  Procedure Laterality Date  . punctured lung     Social History   Occupational History  . Not on file  Tobacco Use  . Smoking status: Current Every Day Smoker    Packs/day: 1.00    Types: Cigarettes  . Smokeless tobacco: Never Used  Substance and Sexual Activity  . Alcohol use: No  . Drug use: No  . Sexual activity: Never    Birth control/protection: Abstinence

## 2019-01-04 ENCOUNTER — Telehealth (INDEPENDENT_AMBULATORY_CARE_PROVIDER_SITE_OTHER): Payer: Self-pay

## 2019-01-04 NOTE — Telephone Encounter (Signed)
See message below °

## 2019-01-04 NOTE — Telephone Encounter (Signed)
No lifting or use of the left arm

## 2019-01-04 NOTE — Telephone Encounter (Signed)
Faxed the 01/02/19 office note to the [facility] adj per her request

## 2019-01-04 NOTE — Telephone Encounter (Signed)
pts [facility] adj needs to know pts current work status. Even though he has been furloughed they still need to know what the status would be.

## 2019-01-05 ENCOUNTER — Encounter (INDEPENDENT_AMBULATORY_CARE_PROVIDER_SITE_OTHER): Payer: Self-pay

## 2019-01-05 NOTE — Telephone Encounter (Signed)
FAXED WORK NOTE TO [phone]

## 2019-01-08 ENCOUNTER — Telehealth (INDEPENDENT_AMBULATORY_CARE_PROVIDER_SITE_OTHER): Payer: Self-pay

## 2019-01-08 NOTE — Telephone Encounter (Signed)
Received fax from [employer] stating pt was set up for PT @ [facility] starting 01/10/19. 12 visits approved.

## 2019-01-30 ENCOUNTER — Encounter: Payer: Self-pay | Admitting: Orthopaedic Surgery

## 2019-01-30 ENCOUNTER — Ambulatory Visit (INDEPENDENT_AMBULATORY_CARE_PROVIDER_SITE_OTHER): Payer: Self-pay

## 2019-01-30 ENCOUNTER — Other Ambulatory Visit: Payer: Self-pay

## 2019-01-30 ENCOUNTER — Ambulatory Visit (INDEPENDENT_AMBULATORY_CARE_PROVIDER_SITE_OTHER): Payer: Self-pay | Admitting: Orthopaedic Surgery

## 2019-01-30 DIAGNOSIS — S52125D Nondisplaced fracture of head of left radius, subsequent encounter for closed fracture with routine healing: Secondary | ICD-10-CM

## 2019-01-30 NOTE — Progress Notes (Signed)
     Patient: Christopher Giles           Date of Birth: 01-10-1966           MRN: [id number] Visit Date: 01/30/2019 PCP: Dartha Lodge, [name]   Assessment & Plan:  Chief Complaint:  Chief Complaint  Patient presents with  . Left Elbow - Pain, Follow-up   Visit Diagnoses:  1. Closed nondisplaced fracture of head of left radius with routine healing, subsequent encounter     Plan: [name] is 9 weeks status post nondisplaced left radial neck fracture.  He is overall doing well.  He reports a significant improvement in pain.  He has been working with OT on strength and range of motion. His physical exam shows near full range of motion without significant pain or mechanical blocks to range of motion.  He has no tenderness of the radial head or neck. Clinically he seems to be doing quite well.  His x-rays do demonstrate bony consolidation but I would like him to demonstrate more radiographic evidence of healing before I release him.  Therefore I would like to see him back in another 6 weeks with repeat 2 view x-rays of the left elbow.  Again we discussed that he would be appropriate for light duty but his job currently does not allow for that.  Therefore we will keep him out of work until we see him again in 6 weeks.  Follow-Up Instructions: Return in about 6 weeks (around 03/13/2019).   Orders:  Orders Placed This Encounter  Procedures  . XR Elbow 2 Views Left   No orders of the defined types were placed in this encounter.   Imaging: Xr Elbow 2 Views Left  Result Date: 01/30/2019 Stable alignment of the radial neck fracture.  Fracture appears to be consolidating   PMFS History: Patient Active Problem List   Diagnosis Date Noted  . MDD (major depressive disorder), recurrent severe, without psychosis (HCC) 07/16/2016  . Moderate episode of recurrent major depressive disorder (HCC)   . Severe episode of recurrent major depressive disorder, without psychotic features [facility])    Past  Medical History:  Diagnosis Date  . Anxiety   . Depression   . Substance abuse (HCC)   . Suicide attempt (HCC)    x 6 per pt  . Thyroid disease    hypo    Family History  Problem Relation Age of Onset  . Hyperlipidemia Mother   . Cancer Mother   . Cancer Father   . Diabetes Maternal Grandmother     Past Surgical History:  Procedure Laterality Date  . punctured lung     Social History   Occupational History  . Not on file  Tobacco Use  . Smoking status: Current Every Day Smoker    Packs/day: 1.00    Types: Cigarettes  . Smokeless tobacco: Never Used  Substance and Sexual Activity  . Alcohol use: No  . Drug use: No  . Sexual activity: Never    Birth control/protection: Abstinence

## 2019-01-31 ENCOUNTER — Telehealth: Payer: Self-pay

## 2019-01-31 NOTE — Telephone Encounter (Signed)
Faxed the 01/30/19 office note to the [facility] adj per her request

## 2019-03-06 ENCOUNTER — Telehealth: Payer: Self-pay

## 2019-03-06 NOTE — Telephone Encounter (Signed)
Patient called back. Per last OV note Dr Erlinda Hong said OOW x 6 weeks. Faxed note to company. He will get updated work note next visit 03/13/2019.

## 2019-03-13 ENCOUNTER — Ambulatory Visit (INDEPENDENT_AMBULATORY_CARE_PROVIDER_SITE_OTHER): Payer: Worker's Compensation

## 2019-03-13 ENCOUNTER — Other Ambulatory Visit: Payer: Self-pay

## 2019-03-13 ENCOUNTER — Encounter: Payer: Self-pay | Admitting: Orthopaedic Surgery

## 2019-03-13 ENCOUNTER — Ambulatory Visit (INDEPENDENT_AMBULATORY_CARE_PROVIDER_SITE_OTHER): Payer: Worker's Compensation | Admitting: Orthopaedic Surgery

## 2019-03-13 DIAGNOSIS — S52125D Nondisplaced fracture of head of left radius, subsequent encounter for closed fracture with routine healing: Secondary | ICD-10-CM | POA: Diagnosis not present

## 2019-03-13 NOTE — Progress Notes (Signed)
Office Visit Note   Patient: Christopher Giles           Date of Birth: Sep 30, 1965           MRN: [id number] Visit Date: 03/13/2019              Requested by: [name], [name], [name] [address] [place],  [place] [id number] PCP: [name], [name], [name]   Assessment & Plan: Visit Diagnoses:  1. Closed nondisplaced fracture of head of left radius with routine healing, subsequent encounter     Plan: Impression is 15 weeks status post left radial head/neck fracture.  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  We will have him continue to work on strengthening exercises with therapy for another 4 weeks.  He will follow-up with us at that time where we anticipate releasing him back to work full duty.  Out of work for 4 weeks's note provided today.  Call with concerns or questions.  Follow-Up Instructions: Return in about 4 weeks (around 04/10/2019).   Orders:  Orders Placed This Encounter  Procedures  . XR Elbow 2 Views Left   No orders of the defined types were placed in this encounter.     Procedures: No procedures performed   Clinical Data: No additional findings.   Subjective: Chief Complaint  Patient presents with  . Left Elbow - Follow-up    HPI patient is a pleasant 53 year old gentleman who presents our clinic today approximately 15 weeks status post left radial head and neck fracture, date of injury 11/30/2018.  He has been in OT for the past few months.  He has regained full range of motion, but still lacks a little strength.  Overall, doing well.  Of note, he has a [employer] job where he has to do a lot of heavy lifting up to 50 pounds.  He has been out of work.  Review of Systems as detailed in HPI.  All others reviewed and are negative.   Objective: Vital Signs: There were no vitals taken for this visit.  Physical Exam well-developed and well-nourished gentleman in no acute distress.  Alert and oriented x3.  Ortho Exam examination of his  left upper extremity reveals no tenderness at the fracture site.  Full range of motion of the elbow.  4 out of 5 strength throughout.  He is neurovascularly intact distally.  Specialty Comments:  No specialty comments available.  Imaging: Xr Elbow 2 Views Left  Result Date: 03/13/2019 X-rays demonstrate continued healing to the fracture with continued evidence of callus formation    PMFS History: Patient Active Problem List   Diagnosis Date Noted  . MDD (major depressive disorder), recurrent severe, without psychosis (HCC) 07/16/2016  . Moderate episode of recurrent major depressive disorder (HCC)   . Severe episode of recurrent major depressive disorder, without psychotic features [facility](HCC)    Past Medical History:  Diagnosis Date  . Anxiety   . Depression   . Substance abuse (HCC)   . Suicide attempt (HCC)    x 6 per pt  . Thyroid disease    hypo    Family History  Problem Relation Age of Onset  . Hyperlipidemia Mother   . Cancer Mother   . Cancer Father   . Diabetes Maternal Grandmother     Past Surgical History:  Procedure Laterality Date  . punctured lung     Social History   Occupational History  . Not on file  Tobacco  Use  . Smoking status: Current Every Day Smoker    Packs/day: 1.00    Types: Cigarettes  . Smokeless tobacco: Never Used  Substance and Sexual Activity  . Alcohol use: No  . Drug use: No  . Sexual activity: Never    Birth control/protection: Abstinence

## 2019-03-14 ENCOUNTER — Telehealth: Payer: Self-pay

## 2019-03-14 NOTE — Telephone Encounter (Signed)
Jody with [employer] would like last office note faxed to [phone].  Cb# is [phone].  Please advise.  Thank You.

## 2019-03-15 NOTE — Telephone Encounter (Signed)
Note faxed.

## 2019-04-10 ENCOUNTER — Ambulatory Visit (INDEPENDENT_AMBULATORY_CARE_PROVIDER_SITE_OTHER): Payer: Worker's Compensation | Admitting: Orthopaedic Surgery

## 2019-04-10 ENCOUNTER — Encounter: Payer: Self-pay | Admitting: Physician Assistant

## 2019-04-10 ENCOUNTER — Ambulatory Visit (INDEPENDENT_AMBULATORY_CARE_PROVIDER_SITE_OTHER): Payer: Worker's Compensation

## 2019-04-10 DIAGNOSIS — S52125D Nondisplaced fracture of head of left radius, subsequent encounter for closed fracture with routine healing: Secondary | ICD-10-CM | POA: Diagnosis not present

## 2019-04-10 NOTE — Progress Notes (Signed)
     Patient: Christopher Giles           Date of Birth: 30-Jun-1966           MRN: [id number] Visit Date: 04/10/2019 PCP: Elbert Ewings, [name]   Assessment & Plan:  Chief Complaint:  Chief Complaint  Patient presents with  . Left Elbow - Follow-up, Fracture   Visit Diagnoses:  1. Closed nondisplaced fracture of head of left radius with routine healing, subsequent encounter     Plan: Patient is a pleasant 53 year old gentleman who presents our clinic today nearly 20 weeks status post left radial neck fracture.  He has been doing well.  He denies any pain.  He has finished occupational therapy where he has regained full range of motion and strength.  He has been practicing lifting upwards of 50 pounds which is what he has to do on the job.  He has not returned to work at this point.  Examination of his left elbow reveals no tenderness to the fracture site.  He has full range of motion and strength.  At this point, he has demonstrated clinical healing of the fracture.  I will allow him to return to work full duty without restrictions this coming Monday, 04/16/2019.  He will follow-up with [place] in 2 months time for repeat evaluation and 2 view x-rays of the left elbow.  Call with concerns or questions in the meantime  Follow-Up Instructions: Return in 2 months (on 06/11/2019), or if symptoms worsen or fail to improve.   Orders:  Orders Placed This Encounter  Procedures  . XR Elbow 2 Views Left   No orders of the defined types were placed in this encounter.   Imaging: Xr Elbow 2 Views Left  Result Date: 04/10/2019 X-rays demonstrate bony consolidation of the radial neck fracture.     PMFS History: Patient Active Problem List   Diagnosis Date Noted  . MDD (major depressive disorder), recurrent severe, without psychosis ([place]) 07/16/2016  . Moderate episode of recurrent major depressive disorder ([place])   . Severe episode of recurrent major depressive disorder, without psychotic features  [facility])    Past Medical History:  Diagnosis Date  . Anxiety   . Depression   . Substance abuse ([place])   . Suicide attempt ([place])    x 6 per pt  . Thyroid disease    hypo    Family History  Problem Relation Age of Onset  . Hyperlipidemia Mother   . Cancer Mother   . Cancer Father   . Diabetes Maternal Grandmother     Past Surgical History:  Procedure Laterality Date  . punctured lung     Social History   Occupational History  . Not on file  Tobacco Use  . Smoking status: Current Every Day Smoker    Packs/day: 1.00    Types: Cigarettes  . Smokeless tobacco: Never Used  Substance and Sexual Activity  . Alcohol use: No  . Drug use: No  . Sexual activity: Never    Birth control/protection: Abstinence

## 2019-04-11 ENCOUNTER — Telehealth: Payer: Self-pay

## 2019-04-11 NOTE — Telephone Encounter (Signed)
Faxed the 04/10/19 office and work note to [facility] adj per her request

## 2019-05-15 ENCOUNTER — Encounter: Payer: Self-pay | Admitting: Orthopaedic Surgery

## 2019-05-15 ENCOUNTER — Ambulatory Visit (INDEPENDENT_AMBULATORY_CARE_PROVIDER_SITE_OTHER): Payer: Worker's Compensation

## 2019-05-15 ENCOUNTER — Ambulatory Visit (INDEPENDENT_AMBULATORY_CARE_PROVIDER_SITE_OTHER): Payer: Worker's Compensation | Admitting: Orthopaedic Surgery

## 2019-05-15 DIAGNOSIS — S52125D Nondisplaced fracture of head of left radius, subsequent encounter for closed fracture with routine healing: Secondary | ICD-10-CM

## 2019-05-15 NOTE — Progress Notes (Signed)
   Office Visit Note   Patient: Christopher Giles           Date of Birth: [birth year]           MRN: [id number] Visit Date: 05/15/2019              Requested by: Elbert Ewings, [place] [place],  [place] [id number] PCP: Elbert Ewings, [name]   Assessment & Plan: Visit Diagnoses:  1. Closed nondisplaced fracture of head of left radius with routine healing, subsequent encounter     Plan: At this point he has demonstrated radiographic and clinical healing of the fracture.  He may return back to work without restrictions.  Work note provided today.  Follow-up as needed.  Follow-Up Instructions: Return if symptoms worsen or fail to improve.   Orders:  Orders Placed This Encounter  Procedures  . XR Elbow 2 Views Left   No orders of the defined types were placed in this encounter.     Procedures: No procedures performed   Clinical Data: No additional findings.   Subjective: Chief Complaint  Patient presents with  . Left Elbow - Follow-up    Mr. Godshall is following up for his left radial neck fracture.  He is doing well.  He has been back to full work for a month and doing well overall.   Review of Systems   Objective: Vital Signs: There were no vitals taken for this visit.  Physical Exam  Ortho Exam Left elbow exam shows full range of motion and full pronation supination.  There is minimal tenderness of the radial head.  There is no swelling. Specialty Comments:  No specialty comments available.  Imaging: Xr Elbow 2 Views Left  Result Date: 05/15/2019 Healed the radial neck fracture with bridging bony callus formation.    PMFS History: Patient Active Problem List   Diagnosis Date Noted  . MDD (major depressive disorder), recurrent severe, without psychosis ([place]) 07/16/2016  . Moderate episode of recurrent major depressive disorder ([place])   . Severe episode of recurrent major depressive disorder, without psychotic features [facility])    Past  Medical History:  Diagnosis Date  . Anxiety   . Depression   . Substance abuse ([place])   . Suicide attempt ([place])    x 6 per pt  . Thyroid disease    hypo    Family History  Problem Relation Age of Onset  . Hyperlipidemia Mother   . Cancer Mother   . Cancer Father   . Diabetes Maternal Grandmother     Past Surgical History:  Procedure Laterality Date  . punctured lung     Social History   Occupational History  . Not on file  Tobacco Use  . Smoking status: Current Every Day Smoker    Packs/day: 1.00    Types: Cigarettes  . Smokeless tobacco: Never Used  Substance and Sexual Activity  . Alcohol use: No  . Drug use: No  . Sexual activity: Never    Birth control/protection: Abstinence

## 2019-06-27 ENCOUNTER — Telehealth: Payer: Self-pay

## 2019-06-27 NOTE — Telephone Encounter (Signed)
Gave 25r rating form for Dr. Erlinda Hong to complete so I can send to [place]

## 2019-07-16 ENCOUNTER — Telehealth: Payer: Self-pay

## 2019-07-16 NOTE — Telephone Encounter (Signed)
Faxed back completed 25r form to [employer] Rating marked was 0%

## 2020-10-17 ENCOUNTER — Other Ambulatory Visit: Payer: Self-pay

## 2020-11-11 ENCOUNTER — Other Ambulatory Visit: Payer: Self-pay

## 2020-11-11 ENCOUNTER — Emergency Department (HOSPITAL_COMMUNITY)
Admission: EM | Admit: 2020-11-11 | Discharge: 2020-11-11 | Disposition: A | Discharge status: Home / Self Care | Payer: Self-pay | Attending: Emergency Medicine | Admitting: Emergency Medicine

## 2020-11-11 ENCOUNTER — Encounter (HOSPITAL_COMMUNITY): Payer: Self-pay

## 2020-11-11 DIAGNOSIS — R197 Diarrhea, unspecified: Secondary | ICD-10-CM | POA: Insufficient documentation

## 2020-11-11 DIAGNOSIS — F17210 Nicotine dependence, cigarettes, uncomplicated: Secondary | ICD-10-CM | POA: Insufficient documentation

## 2020-11-11 DIAGNOSIS — E039 Hypothyroidism, unspecified: Secondary | ICD-10-CM | POA: Insufficient documentation

## 2020-11-11 DIAGNOSIS — R1084 Generalized abdominal pain: Secondary | ICD-10-CM | POA: Insufficient documentation

## 2020-11-11 DIAGNOSIS — Z79899 Other long term (current) drug therapy: Secondary | ICD-10-CM | POA: Insufficient documentation

## 2020-11-11 DIAGNOSIS — K2970 Gastritis, unspecified, without bleeding: Secondary | ICD-10-CM

## 2020-11-11 DIAGNOSIS — R1110 Vomiting, unspecified: Secondary | ICD-10-CM | POA: Insufficient documentation

## 2020-11-11 LAB — CBC WITH DIFFERENTIAL/PLATELET
Abs Immature Granulocytes: 0.01 10*3/uL (ref 0.00–0.07)
Basophils Absolute: 0.1 10*3/uL (ref 0.0–0.1)
Basophils Relative: 1 %
Eosinophils Absolute: 0 10*3/uL (ref 0.0–0.5)
Eosinophils Relative: 0 %
HCT: 44.7 % (ref 39.0–52.0)
Hemoglobin: 15.3 g/dL (ref 13.0–17.0)
Immature Granulocytes: 0 %
Lymphocytes Relative: 21 %
Lymphs Abs: 1.3 10*3/uL (ref 0.7–4.0)
MCH: 31.6 pg (ref 26.0–34.0)
MCHC: 34.2 g/dL (ref 30.0–36.0)
MCV: 92.4 fL (ref 80.0–100.0)
Monocytes Absolute: 0.5 10*3/uL (ref 0.1–1.0)
Monocytes Relative: 9 %
Neutro Abs: 4.1 10*3/uL (ref 1.7–7.7)
Neutrophils Relative %: 69 %
Platelets: 236 10*3/uL (ref 150–400)
RBC: 4.84 MIL/uL (ref 4.22–5.81)
RDW: 11.8 % (ref 11.5–15.5)
WBC: 5.9 10*3/uL (ref 4.0–10.5)
nRBC: 0 % (ref 0.0–0.2)

## 2020-11-11 LAB — COMPREHENSIVE METABOLIC PANEL
ALT: 13 U/L (ref 0–44)
AST: 16 U/L (ref 15–41)
Albumin: 4.3 g/dL (ref 3.5–5.0)
Alkaline Phosphatase: 62 U/L (ref 38–126)
Anion gap: 12 (ref 5–15)
BUN: 14 mg/dL (ref 6–20)
CO2: 24 mmol/L (ref 22–32)
Calcium: 9.1 mg/dL (ref 8.9–10.3)
Chloride: 101 mmol/L (ref 98–111)
Creatinine, Ser: 0.89 mg/dL (ref 0.61–1.24)
GFR, Estimated: >60 mL/min (ref >60)
Glucose, Bld: 99 mg/dL (ref 70–99)
Potassium: 4 mmol/L (ref 3.5–5.1)
Sodium: 137 mmol/L (ref 135–145)
Total Bilirubin: 0.9 mg/dL (ref 0.3–1.2)
Total Protein: 7.3 g/dL (ref 6.5–8.1)

## 2020-11-11 LAB — LIPASE, BLOOD: Lipase: 28 U/L (ref 11–51)

## 2020-11-11 MED ORDER — PANTOPRAZOLE SODIUM 20 MG PO TBEC: 20.0000 mg | DELAYED_RELEASE_TABLET | Freq: Two times a day (BID) | ORAL | 0 refills | Status: DC

## 2020-11-11 MED ORDER — SUCRALFATE 1 G PO TABS: 1.0000 g | ORAL_TABLET | Freq: Four times a day (QID) | ORAL | 0 refills | Status: DC

## 2020-11-11 MED ORDER — FAMOTIDINE 20 MG PO TABS
40.0000 mg | ORAL_TABLET | Freq: Once | ORAL | Status: AC
  Administered 2020-11-11: 40 mg via ORAL
  Filled 2020-11-11: qty 2

## 2020-11-11 MED ORDER — SUCRALFATE 1 G PO TABS
1.0000 g | ORAL_TABLET | Freq: Once | ORAL | Status: AC
  Administered 2020-11-11: 1 g via ORAL
  Filled 2020-11-11: qty 1

## 2020-11-11 MED ORDER — ALUM & MAG HYDROXIDE-SIMETH 200-200-20 MG/5ML PO SUSP
30.0000 mL | Freq: Once | ORAL | Status: AC
  Administered 2020-11-11: 30 mL via ORAL
  Filled 2020-11-11: qty 30

## 2020-11-11 MED ORDER — LIDOCAINE VISCOUS HCL 2 % MT SOLN
15.0000 mL | Freq: Once | OROMUCOSAL | Status: AC
  Administered 2020-11-11: 15 mL via ORAL
  Filled 2020-11-11: qty 15

## 2020-11-11 NOTE — ED Provider Notes (Signed)
[facility]-EMERGENCY DEPT Provider Note   CSN: [id number] Arrival date & time: 11/11/20  1048     History Chief Complaint  Patient presents with  . Abdominal Pain    Christopher Giles is a 55 y.o. male.  55 year old male presents with diffuse abdominal discomfort times a month.  States that he has burning that is worse after he eats acidic food.  Has had intermittent vomiting and diarrhea.  None currently.  Treated with Zofran with some relief.  No urinary symptoms.  No black or bloody stools.  States it also is worse after he smokes cigarettes.  Called his doctor and told to come here        Past Medical History:  Diagnosis Date  . Anxiety   . Depression   . Substance abuse ([facility])   . Suicide attempt ([facility])    x 6 per pt  . Thyroid disease    hypo    Patient Active Problem List   Diagnosis Date Noted  . MDD (major depressive disorder), recurrent severe, without psychosis ([facility]) 07/16/2016  . Moderate episode of recurrent major depressive disorder ([facility])   . Severe episode of recurrent major depressive disorder, without psychotic features [facility])     Past Surgical History:  Procedure Laterality Date  . punctured lung         Family History  Problem Relation Age of Onset  . Hyperlipidemia Mother   . Cancer Mother   . Cancer Father   . Diabetes Maternal Grandmother     Social History   Tobacco Use  . Smoking status: Current Every Day Smoker    Packs/day: 1.00    Types: Cigarettes  . Smokeless tobacco: Never Used  Substance Use Topics  . Alcohol use: No  . Drug use: No    Home Medications Prior to Admission medications   Medication Sig Start Date End Date Taking? Authorizing Provider  ATORVASTATIN CALCIUM PO Take by mouth.    [provider]  cholecalciferol (VITAMIN D) 1000 units tablet Take 1 tablet (1,000 Units total) by mouth daily. Patient taking differently: Take 1,000 Units by mouth every morning.  07/07/16    Starkes-Perry, Juel Burrow, [name]  clonazePAM (KLONOPIN) 0.5 MG tablet Take 1 tablet (0.5 mg total) by mouth 2 (two) times daily. 07/28/16   Withrow, Everardo All, [name]  Divalproex Sodium (DEPAKOTE PO) Take 1,000 mg by mouth once.    [provider]  gabapentin (NEURONTIN) 300 MG capsule Take 300 mg by mouth 2 (two) times daily.    [provider]  levothyroxine (SYNTHROID, LEVOTHROID) 75 MCG tablet Take 1 tablet (75 mcg total) by mouth daily before breakfast. 07/28/16   Withrow, Everardo All, [name]  nicotine (NICODERM CQ - DOSED IN MG/24 HOURS) 21 mg/24hr patch Place 1 patch (21 mg total) onto the skin daily at 6 (six) AM. 07/29/16   Withrow, Everardo All, [name]  pantoprazole (PROTONIX) 20 MG tablet Take 1 tablet (20 mg total) by mouth daily. 07/29/16   Withrow, Everardo All, [name]  sertraline (ZOLOFT) 100 MG tablet Take 1 tablet (100 mg total) by mouth daily. Patient taking differently: Take 200 mg by mouth every morning.  07/29/16   Withrow, Everardo All, [name]  terazosin (HYTRIN) 1 MG capsule Take 1 mg by mouth at bedtime.    [provider]  traZODone (DESYREL) 50 MG tablet Take 0.5 tablets (25 mg total) by mouth at bedtime as needed for sleep. Patient taking differently: Take 50 mg by mouth  at bedtime as needed for sleep.  07/28/16   Withrow, Everardo All, [name]    Allergies    Patient has no known allergies.  Review of Systems   Review of Systems  All other systems reviewed and are negative.   Physical Exam Updated Vital Signs BP (!) 120/97 (BP Location: Left Arm)   Pulse 83   Temp 97.8 F (36.6 C) (Oral)   Resp 16   Ht 1.93 m (6\' 4" )   Wt 102.1 kg   SpO2 95%   BMI 27.39 kg/m   Physical Exam Vitals and nursing note reviewed.  Constitutional:      General: He is not in acute distress.    Appearance: Normal appearance. He is well-developed and well-nourished. He is not toxic-appearing.  HENT:     Head: Normocephalic and atraumatic.  Eyes:     General: Lids are normal.     Extraocular  Movements: EOM normal.     Conjunctiva/sclera: Conjunctivae normal.     Pupils: Pupils are equal, round, and reactive to light.  Neck:     Thyroid: No thyroid mass.     Trachea: No tracheal deviation.  Cardiovascular:     Rate and Rhythm: Normal rate and regular rhythm.     Heart sounds: Normal heart sounds. No murmur heard. No gallop.   Pulmonary:     Effort: Pulmonary effort is normal. No respiratory distress.     Breath sounds: Normal breath sounds. No stridor. No decreased breath sounds, wheezing, rhonchi or rales.  Abdominal:     General: Abdomen is flat. Bowel sounds are normal. There is no distension.     Palpations: Abdomen is soft.     Tenderness: There is no abdominal tenderness. There is no CVA tenderness or rebound.  Musculoskeletal:        General: No tenderness or edema. Normal range of motion.     Cervical back: Normal range of motion and neck supple.  Skin:    General: Skin is warm and dry.     Findings: No abrasion or rash.  Neurological:     Mental Status: He is alert and oriented to person, place, and time.     GCS: GCS eye subscore is 4. GCS verbal subscore is 5. GCS motor subscore is 6.     Cranial Nerves: No cranial nerve deficit.     Sensory: No sensory deficit.     Deep Tendon Reflexes: Strength normal.  Psychiatric:        Mood and Affect: Mood and affect normal.        Speech: Speech normal.        Behavior: Behavior normal.     ED Results / Procedures / Treatments   Labs (all labs ordered are listed, but only abnormal results are displayed) Labs Reviewed  CBC WITH DIFFERENTIAL/PLATELET  COMPREHENSIVE METABOLIC PANEL  LIPASE, BLOOD    EKG None  Radiology No results found.  Procedures Procedures   Medications Ordered in ED Medications  alum & mag hydroxide-simeth (MAALOX/MYLANTA) 200-200-20 MG/5ML suspension 30 mL (has no administration in time range)    And  lidocaine (XYLOCAINE) 2 % viscous mouth solution 15 mL (has no  administration in time range)  sucralfate (CARAFATE) tablet 1 g (has no administration in time range)    ED Course  I have reviewed the triage vital signs and the nursing notes.  Pertinent labs & imaging results that were available during my care of the patient were reviewed by me and  considered in my medical decision making (see chart for details).    MDM Rules/Calculators/A&P                          Patient treated for likely peptic ulcer disease.  Will give GI referral as well as place on proton pump inhibitors Final Clinical Impression(s) / ED Diagnoses Final diagnoses:  None    Rx / DC Orders ED Discharge Orders    None       Lorre Nick, MD 11/11/20 1305

## 2020-11-11 NOTE — ED Triage Notes (Signed)
Pt presents with c/o abdominal pain for one month. Pt reports he has been to see his PCP and they are unable to find a diagnosis. Pt also reports vomiting and diarrhea with the pain.

## 2020-11-11 NOTE — ED Provider Notes (Signed)
[facility]-EMERGENCY DEPT Provider Note   CSN: [id number] Arrival date & time: 11/11/20  1048     History Chief Complaint  Patient presents with  . Abdominal Pain    Christopher Giles is a 55 y.o. male.  HPI     Past Medical History:  Diagnosis Date  . Anxiety   . Depression   . Substance abuse ([facility])   . Suicide attempt ([facility])    x 6 per pt  . Thyroid disease    hypo    Patient Active Problem List   Diagnosis Date Noted  . MDD (major depressive disorder), recurrent severe, without psychosis ([facility]) 07/16/2016  . Moderate episode of recurrent major depressive disorder ([facility])   . Severe episode of recurrent major depressive disorder, without psychotic features [facility])     Past Surgical History:  Procedure Laterality Date  . punctured lung         Family History  Problem Relation Age of Onset  . Hyperlipidemia Mother   . Cancer Mother   . Cancer Father   . Diabetes Maternal Grandmother     Social History   Tobacco Use  . Smoking status: Current Every Day Smoker    Packs/day: 1.00    Types: Cigarettes  . Smokeless tobacco: Never Used  Substance Use Topics  . Alcohol use: No  . Drug use: No    Home Medications Prior to Admission medications   Medication Sig Start Date End Date Taking? Authorizing Provider  ATORVASTATIN CALCIUM PO Take by mouth.    [provider]  cholecalciferol (VITAMIN D) 1000 units tablet Take 1 tablet (1,000 Units total) by mouth daily. Patient taking differently: Take 1,000 Units by mouth every morning.  07/07/16   Starkes-Perry, Juel Burrow, [name]  clonazePAM (KLONOPIN) 0.5 MG tablet Take 1 tablet (0.5 mg total) by mouth 2 (two) times daily. 07/28/16   Withrow, Everardo All, [name]  Divalproex Sodium (DEPAKOTE PO) Take 1,000 mg by mouth once.    [provider]  gabapentin (NEURONTIN) 300 MG capsule Take 300 mg by mouth 2 (two) times daily.    [provider]  levothyroxine (SYNTHROID, LEVOTHROID) 75  MCG tablet Take 1 tablet (75 mcg total) by mouth daily before breakfast. 07/28/16   Withrow, Everardo All, [name]  nicotine (NICODERM CQ - DOSED IN MG/24 HOURS) 21 mg/24hr patch Place 1 patch (21 mg total) onto the skin daily at 6 (six) AM. 07/29/16   Withrow, Everardo All, [name]  pantoprazole (PROTONIX) 20 MG tablet Take 1 tablet (20 mg total) by mouth daily. 07/29/16   Withrow, Everardo All, [name]  sertraline (ZOLOFT) 100 MG tablet Take 1 tablet (100 mg total) by mouth daily. Patient taking differently: Take 200 mg by mouth every morning.  07/29/16   Withrow, Everardo All, [name]  terazosin (HYTRIN) 1 MG capsule Take 1 mg by mouth at bedtime.    [provider]  traZODone (DESYREL) 50 MG tablet Take 0.5 tablets (25 mg total) by mouth at bedtime as needed for sleep. Patient taking differently: Take 50 mg by mouth at bedtime as needed for sleep.  07/28/16   Withrow, Everardo All, [name]    Allergies    Patient has no known allergies.  Review of Systems   Review of Systems  All other systems reviewed and are negative.   Physical Exam Updated Vital Signs BP (!) 120/97 (BP Location: Left Arm)   Pulse 83   Temp 97.8 F (36.6 C) (Oral)   Resp  16   Ht 1.93 m (6\' 4" )   Wt 102.1 kg   SpO2 95%   BMI 27.39 kg/m   Physical Exam Vitals and nursing note reviewed.  Constitutional:      General: He is not in acute distress.    Appearance: Normal appearance. He is well-developed and well-nourished. He is not toxic-appearing.  HENT:     Head: Normocephalic and atraumatic.  Eyes:     General: Lids are normal.     Extraocular Movements: EOM normal.     Conjunctiva/sclera: Conjunctivae normal.     Pupils: Pupils are equal, round, and reactive to light.  Neck:     Thyroid: No thyroid mass.     Trachea: No tracheal deviation.  Cardiovascular:     Rate and Rhythm: Normal rate and regular rhythm.     Heart sounds: Normal heart sounds. No murmur heard. No gallop.   Pulmonary:     Effort: Pulmonary effort is normal. No  respiratory distress.     Breath sounds: Normal breath sounds. No stridor. No decreased breath sounds, wheezing, rhonchi or rales.  Abdominal:     General: Bowel sounds are normal. There is no distension.     Palpations: Abdomen is soft.     Tenderness: There is no abdominal tenderness. There is no CVA tenderness or rebound.  Musculoskeletal:        General: No tenderness or edema. Normal range of motion.     Cervical back: Normal range of motion and neck supple.  Skin:    General: Skin is warm and dry.     Findings: No abrasion or rash.  Neurological:     Mental Status: He is alert and oriented to person, place, and time.     GCS: GCS eye subscore is 4. GCS verbal subscore is 5. GCS motor subscore is 6.     Cranial Nerves: No cranial nerve deficit.     Sensory: No sensory deficit.     Deep Tendon Reflexes: Strength normal.  Psychiatric:        Mood and Affect: Mood and affect normal.        Speech: Speech normal.        Behavior: Behavior normal.     ED Results / Procedures / Treatments   Labs (all labs ordered are listed, but only abnormal results are displayed) Labs Reviewed - No data to display  EKG None  Radiology No results found.  Procedures Procedures   Medications Ordered in ED Medications  alum & mag hydroxide-simeth (MAALOX/MYLANTA) 200-200-20 MG/5ML suspension 30 mL (has no administration in time range)    And  lidocaine (XYLOCAINE) 2 % viscous mouth solution 15 mL (has no administration in time range)  sucralfate (CARAFATE) tablet 1 g (has no administration in time range)    ED Course  I have reviewed the triage vital signs and the nursing notes.  Pertinent labs & imaging results that were available during my care of the patient were reviewed by me and considered in my medical decision making (see chart for details).    MDM Rules/Calculators/A&P                           Final Clinical Impression(s) / ED Diagnoses Final diagnoses:  None    Rx  / DC Orders ED Discharge Orders    None       03-22-2001, MD 11/19/20 1036

## 2021-02-07 ENCOUNTER — Other Ambulatory Visit: Payer: Self-pay

## 2021-02-07 ENCOUNTER — Emergency Department (HOSPITAL_COMMUNITY)
Admission: EM | Admit: 2021-02-07 | Discharge: 2021-02-08 | Disposition: A | Discharge status: Other Acute Inpatient Hospital | Payer: Self-pay | Attending: Emergency Medicine | Admitting: Emergency Medicine

## 2021-02-07 ENCOUNTER — Encounter (HOSPITAL_COMMUNITY): Payer: Self-pay | Admitting: Emergency Medicine

## 2021-02-07 DIAGNOSIS — F32A Depression, unspecified: Secondary | ICD-10-CM | POA: Insufficient documentation

## 2021-02-07 DIAGNOSIS — F17210 Nicotine dependence, cigarettes, uncomplicated: Secondary | ICD-10-CM | POA: Insufficient documentation

## 2021-02-07 DIAGNOSIS — Z20822 Contact with and (suspected) exposure to covid-19: Secondary | ICD-10-CM | POA: Insufficient documentation

## 2021-02-07 DIAGNOSIS — R45851 Suicidal ideations: Secondary | ICD-10-CM | POA: Insufficient documentation

## 2021-02-07 DIAGNOSIS — Z79899 Other long term (current) drug therapy: Secondary | ICD-10-CM | POA: Insufficient documentation

## 2021-02-07 DIAGNOSIS — E039 Hypothyroidism, unspecified: Secondary | ICD-10-CM | POA: Insufficient documentation

## 2021-02-07 LAB — RAPID URINE DRUG SCREEN, HOSP PERFORMED
Amphetamines: NOT DETECTED
Barbiturates: NOT DETECTED
Benzodiazepines: NOT DETECTED
Cocaine: POSITIVE — AB
Opiates: NOT DETECTED
Tetrahydrocannabinol: POSITIVE — AB

## 2021-02-07 LAB — COMPREHENSIVE METABOLIC PANEL
ALT: 15 U/L (ref 0–44)
AST: 26 U/L (ref 15–41)
Albumin: 4.4 g/dL (ref 3.5–5.0)
Alkaline Phosphatase: 70 U/L (ref 38–126)
Anion gap: 12 (ref 5–15)
BUN: 19 mg/dL (ref 6–20)
CO2: 23 mmol/L (ref 22–32)
Calcium: 9.7 mg/dL (ref 8.9–10.3)
Chloride: 101 mmol/L (ref 98–111)
Creatinine, Ser: 1.25 mg/dL — ABNORMAL HIGH (ref 0.61–1.24)
GFR, Estimated: >60 mL/min (ref >60)
Glucose, Bld: 123 mg/dL — ABNORMAL HIGH (ref 70–99)
Potassium: 3.6 mmol/L (ref 3.5–5.1)
Sodium: 136 mmol/L (ref 135–145)
Total Bilirubin: 0.8 mg/dL (ref 0.3–1.2)
Total Protein: 7.4 g/dL (ref 6.5–8.1)

## 2021-02-07 LAB — CBC
HCT: 45.6 % (ref 39.0–52.0)
Hemoglobin: 15.3 g/dL (ref 13.0–17.0)
MCH: 31.3 pg (ref 26.0–34.0)
MCHC: 33.6 g/dL (ref 30.0–36.0)
MCV: 93.3 fL (ref 80.0–100.0)
Platelets: 286 10*3/uL (ref 150–400)
RBC: 4.89 MIL/uL (ref 4.22–5.81)
RDW: 12.1 % (ref 11.5–15.5)
WBC: 8.8 10*3/uL (ref 4.0–10.5)
nRBC: 0 % (ref 0.0–0.2)

## 2021-02-07 LAB — ACETAMINOPHEN LEVEL: Acetaminophen (Tylenol), Serum: <10 ug/mL — ABNORMAL LOW (ref 10–30)

## 2021-02-07 LAB — SALICYLATE LEVEL: Salicylate Lvl: <7 mg/dL — ABNORMAL LOW (ref 7.0–30.0)

## 2021-02-07 LAB — ETHANOL: Alcohol, Ethyl (B): <10 mg/dL (ref <10)

## 2021-02-07 LAB — RESP PANEL BY RT-PCR (FLU A&B, COVID) ARPGX2
Influenza A by PCR: NEGATIVE
Influenza B by PCR: NEGATIVE
SARS Coronavirus 2 by RT PCR: NEGATIVE

## 2021-02-07 MED ORDER — DICYCLOMINE HCL 20 MG PO TABS
20.0000 mg | ORAL_TABLET | Freq: Three times a day (TID) | ORAL | Status: DC
  Administered 2021-02-07 (×2): 20 mg via ORAL
  Filled 2021-02-07 (×2): qty 1

## 2021-02-07 MED ORDER — NAPROXEN SODIUM 275 MG PO TABS
275.0000 mg | ORAL_TABLET | Freq: Two times a day (BID) | ORAL | Status: DC | PRN
  Filled 2021-02-07: qty 1

## 2021-02-07 MED ORDER — LEVOTHYROXINE SODIUM 25 MCG PO TABS: 25.0000 ug | ORAL_TABLET | Freq: Every day | ORAL | Status: DC

## 2021-02-07 MED ORDER — DIVALPROEX SODIUM ER 500 MG PO TB24
1000.0000 mg | ORAL_TABLET | Freq: Every day | ORAL | Status: DC
  Administered 2021-02-07: 1000 mg via ORAL
  Filled 2021-02-07: qty 2

## 2021-02-07 MED ORDER — TAMSULOSIN HCL 0.4 MG PO CAPS
0.4000 mg | ORAL_CAPSULE | Freq: Every day | ORAL | Status: DC
  Administered 2021-02-07: 0.4 mg via ORAL
  Filled 2021-02-07: qty 1

## 2021-02-07 MED ORDER — ARIPIPRAZOLE 5 MG PO TABS
5.0000 mg | ORAL_TABLET | Freq: Every day | ORAL | Status: DC
  Administered 2021-02-07: 5 mg via ORAL
  Filled 2021-02-07: qty 1

## 2021-02-07 MED ORDER — SERTRALINE HCL 100 MG PO TABS
100.0000 mg | ORAL_TABLET | Freq: Every day | ORAL | Status: DC
  Administered 2021-02-07: 100 mg via ORAL
  Filled 2021-02-07: qty 1

## 2021-02-07 MED ORDER — FAMOTIDINE 20 MG PO TABS
40.0000 mg | ORAL_TABLET | Freq: Every day | ORAL | Status: DC
  Administered 2021-02-07: 40 mg via ORAL
  Filled 2021-02-07: qty 2

## 2021-02-07 MED ORDER — ALPRAZOLAM 0.5 MG PO TABS
1.0000 mg | ORAL_TABLET | Freq: Three times a day (TID) | ORAL | Status: DC
  Administered 2021-02-07 (×2): 1 mg via ORAL
  Filled 2021-02-07 (×2): qty 2

## 2021-02-07 MED ORDER — NICOTINE 21 MG/24HR TD PT24
21.0000 mg | MEDICATED_PATCH | Freq: Every day | TRANSDERMAL | Status: DC | PRN
  Administered 2021-02-07: 21 mg via TRANSDERMAL
  Filled 2021-02-07: qty 1

## 2021-02-07 MED ORDER — ATORVASTATIN CALCIUM 40 MG PO TABS
40.0000 mg | ORAL_TABLET | Freq: Every evening | ORAL | Status: DC
  Administered 2021-02-07: 40 mg via ORAL
  Filled 2021-02-07: qty 1

## 2021-02-07 NOTE — ED Triage Notes (Signed)
Pt states he has been off his medications x 2 weeks and feels suicidal with multiple plans.  States 1 plan is to jump off a bridge.  Reports suicidal attempt 5 years ago.
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
## 2021-02-07 NOTE — ED Notes (Signed)
Pt changed into burgundy scrubs.  Security called to wand pt and belongings. Belongings bagged, labeled, and placed in [place] [address].

## 2021-02-07 NOTE — ED Provider Notes (Addendum)
[facility] EMERGENCY DEPARTMENT Provider Note   CSN: [id number] Arrival date & time: 02/07/21  1217     History Chief Complaint  Patient presents with  . Suicidal    Christopher Giles is a 55 y.o. male.  Patient with history of anxiety and depression, substance abuse, hypothyroidism presents to the emergency department today for suicidal ideations.  Patient states that he has been off all his medications for several weeks.  He states that he has "stomach problems" that is made it difficult for him to work and he does not currently have insurance coverage.  He has not been able to obtain his medications and for this reason he has become progressively more depressed and over the past few days has felt suicidal.  Patient has a plan of running out in front of traffic.  He states that he does have history of previous suicide attempts.  He currently denies substance abuse including alcohol.  Other than "stomach problems" which she describes as stomach pain and diarrhea, that are currently controlled, denies other recent illnesses including fevers, cold or flu symptoms.  He states that he would like to get help getting back on his medications and would also like to talk to somebody about his suicidal thoughts.        Past Medical History:  Diagnosis Date  . Anxiety   . Depression   . Substance abuse (HCC)   . Suicide attempt (HCC)    x 6 per pt  . Thyroid disease    hypo    Patient Active Problem List   Diagnosis Date Noted  . MDD (major depressive disorder), recurrent severe, without psychosis (HCC) 07/16/2016  . Moderate episode of recurrent major depressive disorder (HCC)   . Severe episode of recurrent major depressive disorder, without psychotic features [facility])     Past Surgical History:  Procedure Laterality Date  . punctured lung         Family History  Problem Relation Age of Onset  . Hyperlipidemia Mother   . Cancer Mother   . Cancer Father   .  Diabetes Maternal Grandmother     Social History   Tobacco Use  . Smoking status: Current Every Day Smoker    Packs/day: 1.00    Types: Cigarettes  . Smokeless tobacco: Never Used  Substance Use Topics  . Alcohol use: No  . Drug use: No    Home Medications Prior to Admission medications   Medication Sig Start Date End Date Taking? Authorizing Provider  ATORVASTATIN CALCIUM PO Take by mouth.    [provider]  cholecalciferol (VITAMIN D) 1000 units tablet Take 1 tablet (1,000 Units total) by mouth daily. Patient taking differently: Take 1,000 Units by mouth every morning.  07/07/16   Starkes-Perry, Juel Burrow, [name]  clonazePAM (KLONOPIN) 0.5 MG tablet Take 1 tablet (0.5 mg total) by mouth 2 (two) times daily. 07/28/16   Withrow, Everardo All, [name]  Divalproex Sodium (DEPAKOTE PO) Take 1,000 mg by mouth once.    [provider]  gabapentin (NEURONTIN) 300 MG capsule Take 300 mg by mouth 2 (two) times daily.    [provider]  levothyroxine (SYNTHROID, LEVOTHROID) 75 MCG tablet Take 1 tablet (75 mcg total) by mouth daily before breakfast. 07/28/16   Withrow, Everardo All, [name]  nicotine (NICODERM CQ - DOSED IN MG/24 HOURS) 21 mg/24hr patch Place 1 patch (21 mg total) onto the skin daily at 6 (six) AM. 07/29/16   Withrow, Everardo All,  [name]  pantoprazole (PROTONIX) 20 MG tablet Take 1 tablet (20 mg total) by mouth daily. 07/29/16   Withrow, Everardo All, [name]  pantoprazole (PROTONIX) 20 MG tablet Take 1 tablet (20 mg total) by mouth 2 (two) times daily. 11/11/20   Lorre Nick, MD  sertraline (ZOLOFT) 100 MG tablet Take 1 tablet (100 mg total) by mouth daily. Patient taking differently: Take 200 mg by mouth every morning.  07/29/16   Withrow, Everardo All, [name]  sucralfate (CARAFATE) 1 g tablet Take 1 tablet (1 g total) by mouth 4 (four) times daily. 11/11/20   Lorre Nick, MD  terazosin (HYTRIN) 1 MG capsule Take 1 mg by mouth at bedtime.    [provider]  traZODone (DESYREL) 50 MG  tablet Take 0.5 tablets (25 mg total) by mouth at bedtime as needed for sleep. Patient taking differently: Take 50 mg by mouth at bedtime as needed for sleep.  07/28/16   Withrow, Everardo All, [name]    Allergies    Patient has no known allergies.  Review of Systems   Review of Systems  Constitutional: Negative for fever.  HENT: Negative for rhinorrhea and sore throat.   Eyes: Negative for redness.  Respiratory: Negative for cough.   Cardiovascular: Negative for chest pain.  Gastrointestinal: Negative for abdominal pain, diarrhea, nausea and vomiting.  Genitourinary: Negative for dysuria and hematuria.  Musculoskeletal: Negative for myalgias.  Skin: Negative for rash.  Neurological: Negative for headaches.  Psychiatric/Behavioral: Positive for suicidal ideas. The patient is nervous/anxious.     Physical Exam Updated Vital Signs BP 101/76 (BP Location: Right Arm)   Pulse (!) 109   Temp 97.9 F (36.6 C)   Resp 16   SpO2 97%   Physical Exam Vitals and nursing note reviewed.  Constitutional:      Appearance: He is well-developed.  HENT:     Head: Normocephalic and atraumatic.  Eyes:     General:        Right eye: No discharge.        Left eye: No discharge.     Conjunctiva/sclera: Conjunctivae normal.  Cardiovascular:     Rate and Rhythm: Normal rate and regular rhythm.     Heart sounds: Normal heart sounds.  Pulmonary:     Effort: Pulmonary effort is normal.     Breath sounds: Normal breath sounds.  Abdominal:     Palpations: Abdomen is soft.     Tenderness: There is no abdominal tenderness.  Musculoskeletal:     Cervical back: Normal range of motion and neck supple.  Skin:    General: Skin is warm and dry.  Neurological:     Mental Status: He is alert.  Psychiatric:        Attention and Perception: Attention normal.        Mood and Affect: Mood normal.        Speech: Speech normal.        Behavior: Behavior normal.        Thought Content: Thought content includes  suicidal ideation. Thought content does not include homicidal ideation. Thought content includes suicidal plan. Thought content does not include homicidal plan.     ED Results / Procedures / Treatments   Labs (all labs ordered are listed, but only abnormal results are displayed) Labs Reviewed  COMPREHENSIVE METABOLIC PANEL - Abnormal; Notable for the following components:      Result Value   Glucose, Bld 123 (*)    Creatinine, Ser 1.25 (*)  All other components within normal limits  SALICYLATE LEVEL - Abnormal; Notable for the following components:   Salicylate Lvl <7.0 (*)    All other components within normal limits  ACETAMINOPHEN LEVEL - Abnormal; Notable for the following components:   Acetaminophen (Tylenol), Serum <10 (*)    All other components within normal limits  RESP PANEL BY RT-PCR (FLU A&B, COVID) ARPGX2  ETHANOL  CBC  RAPID URINE DRUG SCREEN, HOSP PERFORMED    EKG None  Radiology No results found.  Procedures Procedures   Medications Ordered in ED Medications  ALPRAZolam (XANAX) tablet 1 mg (has no administration in time range)  ARIPiprazole (ABILIFY) tablet 5 mg (has no administration in time range)  atorvastatin (LIPITOR) tablet 40 mg (has no administration in time range)  dicyclomine (BENTYL) tablet 20 mg (has no administration in time range)  divalproex (DEPAKOTE ER) 24 hr tablet 1,000 mg (has no administration in time range)  levothyroxine (SYNTHROID) tablet 25 mcg (has no administration in time range)  naproxen sodium (ALEVE) tablet 220-440 mg (has no administration in time range)  tamsulosin (FLOMAX) capsule 0.4 mg (has no administration in time range)  sertraline (ZOLOFT) tablet 100 mg (has no administration in time range)  famotidine (PEPCID) tablet 40 mg (has no administration in time range)  nicotine (NICODERM CQ - dosed in mg/24 hours) patch 21 mg (has no administration in time range)    ED Course  I have reviewed the triage vital signs  and the nursing notes.  Pertinent labs & imaging results that were available during my care of the patient were reviewed by me and considered in my medical decision making (see chart for details).  Patient seen and examined.  Med list is not up-to-date, awaiting reconciliation by pharmacy tech.  We will plan on ordering his home meds to get started.  Lab work reviewed.  Marginally elevated creatinine, not likely clinically significant today.  He does not appear significantly dehydrated on exam.  TTS consult requested.  We will follow-up.  Vital signs reviewed and are as follows: BP 101/76 (BP Location: Right Arm)   Pulse (!) 109   Temp 97.9 F (36.6 C)   Resp 16   SpO2 97%   3:18 PM Pt is medically cleared. Continuing to await TTS consult.   3:25 PM Med reconciliation completed. Home meds ordered. COVID ordered.     MDM Rules/Calculators/A&P                          Final Clinical Impression(s) / ED Diagnoses Final diagnoses:  Suicidal ideation    Rx / DC Orders ED Discharge Orders    None        Renne Crigler, PA-C 02/07/21 1525    Tegeler, Canary Brim, MD 02/07/21 1537

## 2021-02-07 NOTE — ED Notes (Addendum)
Patient changed to purple scrubs, and 2 bags to locker 1

## 2021-02-08 ENCOUNTER — Encounter (HOSPITAL_COMMUNITY): Payer: Self-pay | Admitting: Emergency Medicine

## 2021-02-08 ENCOUNTER — Inpatient Hospital Stay (HOSPITAL_COMMUNITY)
Admission: RE | Admit: 2021-02-08 | Discharge: 2021-02-15 | DRG: 881 | Disposition: A | Discharge status: Home / Self Care | Payer: Federal, State, Local not specified - Other | Attending: Psychiatry | Admitting: Psychiatry

## 2021-02-08 ENCOUNTER — Ambulatory Visit (HOSPITAL_COMMUNITY)
Admission: EM | Admit: 2021-02-08 | Discharge: 2021-02-08 | Disposition: A | Discharge status: Other Acute Inpatient Hospital | Payer: No Payment, Other | Source: Intra-hospital | Attending: Student | Admitting: Student

## 2021-02-08 ENCOUNTER — Other Ambulatory Visit: Payer: Self-pay

## 2021-02-08 DIAGNOSIS — F17210 Nicotine dependence, cigarettes, uncomplicated: Secondary | ICD-10-CM | POA: Diagnosis present

## 2021-02-08 DIAGNOSIS — F329 Major depressive disorder, single episode, unspecified: Secondary | ICD-10-CM | POA: Diagnosis present

## 2021-02-08 DIAGNOSIS — F332 Major depressive disorder, recurrent severe without psychotic features: Secondary | ICD-10-CM | POA: Diagnosis not present

## 2021-02-08 DIAGNOSIS — Z79899 Other long term (current) drug therapy: Secondary | ICD-10-CM | POA: Diagnosis not present

## 2021-02-08 DIAGNOSIS — Z91120 Patient's intentional underdosing of medication regimen due to financial hardship: Secondary | ICD-10-CM | POA: Insufficient documentation

## 2021-02-08 DIAGNOSIS — Z818 Family history of other mental and behavioral disorders: Secondary | ICD-10-CM | POA: Diagnosis not present

## 2021-02-08 DIAGNOSIS — N400 Enlarged prostate without lower urinary tract symptoms: Secondary | ICD-10-CM | POA: Diagnosis present

## 2021-02-08 DIAGNOSIS — F419 Anxiety disorder, unspecified: Secondary | ICD-10-CM | POA: Diagnosis present

## 2021-02-08 DIAGNOSIS — R45851 Suicidal ideations: Secondary | ICD-10-CM | POA: Diagnosis present

## 2021-02-08 DIAGNOSIS — G4700 Insomnia, unspecified: Secondary | ICD-10-CM | POA: Diagnosis present

## 2021-02-08 DIAGNOSIS — K5900 Constipation, unspecified: Secondary | ICD-10-CM | POA: Diagnosis present

## 2021-02-08 DIAGNOSIS — E039 Hypothyroidism, unspecified: Secondary | ICD-10-CM | POA: Diagnosis present

## 2021-02-08 DIAGNOSIS — F1420 Cocaine dependence, uncomplicated: Secondary | ICD-10-CM | POA: Diagnosis present

## 2021-02-08 DIAGNOSIS — K219 Gastro-esophageal reflux disease without esophagitis: Secondary | ICD-10-CM | POA: Diagnosis present

## 2021-02-08 DIAGNOSIS — T43226A Underdosing of selective serotonin reuptake inhibitors, initial encounter: Secondary | ICD-10-CM | POA: Insufficient documentation

## 2021-02-08 DIAGNOSIS — T43596A Underdosing of other antipsychotics and neuroleptics, initial encounter: Secondary | ICD-10-CM | POA: Insufficient documentation

## 2021-02-08 DIAGNOSIS — Z9151 Personal history of suicidal behavior: Secondary | ICD-10-CM | POA: Insufficient documentation

## 2021-02-08 DIAGNOSIS — Z79890 Hormone replacement therapy (postmenopausal): Secondary | ICD-10-CM | POA: Insufficient documentation

## 2021-02-08 MED ORDER — LEVOTHYROXINE SODIUM 25 MCG PO TABS
25.0000 ug | ORAL_TABLET | Freq: Every day | ORAL | Status: DC
  Administered 2021-02-09 – 2021-02-15 (×7): 25 ug via ORAL
  Filled 2021-02-08: qty 7
  Filled 2021-02-08 (×5): qty 1
  Filled 2021-02-08: qty 7
  Filled 2021-02-08 (×4): qty 1

## 2021-02-08 MED ORDER — NICOTINE 21 MG/24HR TD PT24: 21.0000 mg | MEDICATED_PATCH | Freq: Every day | TRANSDERMAL | Status: DC | PRN

## 2021-02-08 MED ORDER — ALUM & MAG HYDROXIDE-SIMETH 200-200-20 MG/5ML PO SUSP: 30.0000 mL | ORAL | Status: DC | PRN

## 2021-02-08 MED ORDER — FAMOTIDINE 20 MG PO TABS
20.0000 mg | ORAL_TABLET | Freq: Every day | ORAL | Status: DC
  Administered 2021-02-08 – 2021-02-13 (×6): 20 mg via ORAL
  Filled 2021-02-08: qty 7
  Filled 2021-02-08 (×7): qty 1
  Filled 2021-02-08: qty 7
  Filled 2021-02-08: qty 1

## 2021-02-08 MED ORDER — DIVALPROEX SODIUM ER 500 MG PO TB24: 1000.0000 mg | ORAL_TABLET | Freq: Every day | ORAL | Status: DC

## 2021-02-08 MED ORDER — LORAZEPAM 1 MG PO TABS
1.0000 mg | ORAL_TABLET | Freq: Once | ORAL | Status: AC
  Administered 2021-02-08: 1 mg via ORAL
  Filled 2021-02-08: qty 1

## 2021-02-08 MED ORDER — MAGNESIUM HYDROXIDE 400 MG/5ML PO SUSP: 30.0000 mL | Freq: Every day | ORAL | Status: DC | PRN

## 2021-02-08 MED ORDER — TRAZODONE HCL 50 MG PO TABS: 50.0000 mg | ORAL_TABLET | Freq: Every evening | ORAL | Status: DC | PRN

## 2021-02-08 MED ORDER — HYDROXYZINE HCL 25 MG PO TABS: 25.0000 mg | ORAL_TABLET | Freq: Three times a day (TID) | ORAL | Status: DC | PRN

## 2021-02-08 MED ORDER — ACETAMINOPHEN 325 MG PO TABS
650.0000 mg | ORAL_TABLET | Freq: Four times a day (QID) | ORAL | Status: DC | PRN
  Administered 2021-02-12 – 2021-02-14 (×2): 650 mg via ORAL
  Filled 2021-02-08 (×2): qty 2

## 2021-02-08 MED ORDER — FAMOTIDINE 20 MG PO TABS: 40.0000 mg | ORAL_TABLET | Freq: Every day | ORAL | Status: DC

## 2021-02-08 MED ORDER — TAMSULOSIN HCL 0.4 MG PO CAPS
0.4000 mg | ORAL_CAPSULE | Freq: Every day | ORAL | Status: DC
  Administered 2021-02-08 – 2021-02-15 (×8): 0.4 mg via ORAL
  Filled 2021-02-08 (×2): qty 1
  Filled 2021-02-08: qty 7
  Filled 2021-02-08 (×5): qty 1
  Filled 2021-02-08: qty 7
  Filled 2021-02-08 (×3): qty 1

## 2021-02-08 MED ORDER — ARIPIPRAZOLE 5 MG PO TABS: 5.0000 mg | ORAL_TABLET | Freq: Every day | ORAL | Status: DC

## 2021-02-08 MED ORDER — ACETAMINOPHEN 325 MG PO TABS: 650.0000 mg | ORAL_TABLET | Freq: Four times a day (QID) | ORAL | Status: DC | PRN

## 2021-02-08 MED ORDER — SERTRALINE HCL 100 MG PO TABS
200.0000 mg | ORAL_TABLET | Freq: Every day | ORAL | Status: DC
Start: 2021-02-08 — End: 2021-02-08

## 2021-02-08 MED ORDER — TAMSULOSIN HCL 0.4 MG PO CAPS
0.4000 mg | ORAL_CAPSULE | Freq: Every day | ORAL | Status: DC
  Administered 2021-02-08: 0.4 mg via ORAL
  Filled 2021-02-08: qty 1

## 2021-02-08 MED ORDER — SERTRALINE HCL 100 MG PO TABS
100.0000 mg | ORAL_TABLET | Freq: Every day | ORAL | Status: DC
  Administered 2021-02-08 – 2021-02-11 (×4): 100 mg via ORAL
  Filled 2021-02-08 (×8): qty 1

## 2021-02-08 MED ORDER — ATORVASTATIN CALCIUM 40 MG PO TABS: 40.0000 mg | ORAL_TABLET | Freq: Every evening | ORAL | Status: DC

## 2021-02-08 MED ORDER — MAGNESIUM HYDROXIDE 400 MG/5ML PO SUSP
30.0000 mL | Freq: Every day | ORAL | Status: DC | PRN
  Administered 2021-02-10 – 2021-02-11 (×2): 30 mL via ORAL
  Filled 2021-02-08 (×2): qty 30

## 2021-02-08 MED ORDER — ALPRAZOLAM 0.5 MG PO TABS: 1.0000 mg | ORAL_TABLET | Freq: Three times a day (TID) | ORAL | Status: DC | PRN

## 2021-02-08 MED ORDER — ARIPIPRAZOLE 5 MG PO TABS
5.0000 mg | ORAL_TABLET | Freq: Every day | ORAL | Status: DC
  Administered 2021-02-08 – 2021-02-15 (×8): 5 mg via ORAL
  Filled 2021-02-08 (×4): qty 1
  Filled 2021-02-08 (×2): qty 7
  Filled 2021-02-08 (×7): qty 1

## 2021-02-08 MED ORDER — TRAZODONE HCL 50 MG PO TABS
50.0000 mg | ORAL_TABLET | Freq: Every evening | ORAL | Status: DC | PRN
  Administered 2021-02-08 – 2021-02-13 (×6): 50 mg via ORAL
  Filled 2021-02-08: qty 1
  Filled 2021-02-08: qty 7
  Filled 2021-02-08 (×6): qty 1

## 2021-02-08 MED ORDER — DICYCLOMINE HCL 20 MG PO TABS
20.0000 mg | ORAL_TABLET | Freq: Three times a day (TID) | ORAL | Status: DC
  Administered 2021-02-08: 20 mg via ORAL
  Filled 2021-02-08: qty 1

## 2021-02-08 MED ORDER — LEVOTHYROXINE SODIUM 25 MCG PO TABS
25.0000 ug | ORAL_TABLET | Freq: Every day | ORAL | Status: DC
  Administered 2021-02-08: 25 ug via ORAL
  Filled 2021-02-08: qty 1

## 2021-02-08 NOTE — ED Notes (Signed)
Patient cooperative with admission process. Ambulated independently to unit.  Offered food and fluids.  Denied SI, HI, AVH at this time.

## 2021-02-08 NOTE — ED Notes (Signed)
Ambulated per self to retrieve belongings. Still endorsing SI. No HI or AVH. No s/s pain, discomfort, or acute distress. Escort to back [place] for transport with safe transport to [facility]. A&O x4. Medically stable at time of d/c
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
## 2021-02-08 NOTE — ED Notes (Signed)
Pt awake and sitting on side of bed eating breakfast. No new issues noted at this time. Will continue to monitor for safety

## 2021-02-08 NOTE — Progress Notes (Signed)
Per Rosey Bath, [facility], pt has been [facility] to [facility] bed [facility]. Attending provider is Dr. Jola Babinski. Patient can arrive by anytime. Number for report is [phone].   Crissie Reese, MSW, LCSW-A Phone: [phone] Disposition/TOC

## 2021-02-08 NOTE — Progress Notes (Signed)
The focus of this group is to help patients review their daily goal of treatment and discuss progress on daily workbooks. Pt did not attend the evening group. 

## 2021-02-08 NOTE — ED Provider Notes (Addendum)
Behavioral Health Admission [facility]&P [facility])  Date: 02/08/21 Patient Name: Christopher Giles MRN: [id number] Chief Complaint:  Chief Complaint  Patient presents with  . Suicidal      Diagnoses:  Final diagnoses:  Severe episode of recurrent major depressive disorder, without psychotic features (HCC)  Suicidal ideation    HPI: [name]. Johanson is a 55 year old male with history of MDD, anxiety, and multiple past suicide attempts who presents to the [facility] as a voluntary direct admit transfer from [facility] Emergency Department. Per chart review, patient presented to [facility] ED on 02/07/21 for SI. Patient was assessed by TTS on 02/07/21 while patient was in the ED and inpatient psychiatric treatment was recommended for the patient upon 02/07/21 TTS assessment (Please see Dian Situ, LCAS 02/07/21 BH Assessment note for further details regarding TTS assessment if necessary). It was then recommended by Cecilio Asper, NP and myself for patient to be transferred to [facility] for continuous observation/assessment while waiting for placement for inpatient psychiatric treatment.   Patient assessed by myself upon his arrival to [facility] from the ED. patient states that he went to the emergency department on 02/07/2021 because he was experiencing active suicidal ideation with a plan to jump off of a bridge.  Patient states that he did not actually attempt to kill himself on 02/07/2021.  Patient endorses active SI currently on exam with the same plan is 02/07/2021 to jump off a bridge.  Patient reports many past suicide attempts via overdosing on medications and "slitting my wrists".  Patient states that his last suicide attempt was in 2017 in which he states he "took a bunch of Tylenol".  Patient states that this 2017 Tylenol overdose led to an admission to [facility] in 2017.  Per chart review, [facility] to 07/28/2016 after reportedly overdosing on 90 Tylenol tablets as a suicide  attempt.  Chart review also shows that patient was admitted to [facility] from 06/13/2016 to 07/06/2016 as well.  Patient denies any additional psychiatric hospitalizations [facility]  Patient endorses history of self-injurious behavior 1 time total throughout his entire life via cutting his bilateral wrists intentionally with a razor blade as a suicide attempt about 23 years ago, the patient denies any additional self-injurious behavior since this occurred 23 years ago.  Patient denies any history of intentionally burning himself.  Patient denies HI, AVH, paranoia, or delusions on exam.  Patient reports sleeping fairly, about 6 hours per night.  Patient endorses anhedonia, as well as feelings of guilt, hopelessness, and worthlessness.  Patient endorses declines in energy, concentration, and appetite.  Patient reports that he has lost about 40 pounds this past year due to not eating secondary to his depression and he states that he has potentially lost about 15 to 20 pounds over the past 2 to 3 months.  Patient reports that he sees Dr. Milagros Evener for psychotropic medication management. Patient states that his last visit with Dr. Evelene Croon was about 3 to 4 months ago and he states that he is not sure when his next appointment with Dr. Evelene Croon is.  Patient reports that his home psychotropic medication regimen includes Zoloft 200 mg p.o. at bedtime, Abilify 5 mg p.o. at bedtime, Depakote ER 1000 mg (2 500 mg tablets) p.o. at bedtime, and Xanax 1 mg p.o. 3 times daily.  Patient states that he has not taken his home medications for the past 2 to  3 weeks because he states that for the past 2 to 3 weeks, he has been unable to afford his prescriptions.  Patient also reports that he was taking Adderall in the past, but he states that he has not taken this for about 1 year.  PDMP review shows history of patient being prescribed Xanax 1 mg and Adderall 20 mg by the same  provider with most recent Xanax 1 mg prescription being written and filled on 01/12/2021 and PDMP review also shows that the last Adderall 20 mg prescription patient had was filled on 04/17/2020.  Patient states that he is not currently seeing a therapist.  He reports that he last saw a therapist 6 months ago at family services at the [place].  Patient denies any alcohol use.  Patient does endorse smoking 1 pack/day of cigarettes for the past 35 years. Patient denies any additional substance use.  Of note, patient's 02/07/2021 UDS was positive for cocaine and THC.  Patient reports that he lives in [place] by himself.  Patient denies any access to guns or weapons.  Patient states that he is currently employed at [employer].  PHQ 2-9:   Flowsheet Row ED from 02/07/2021 in [facility] EMERGENCY DEPARTMENT ED from 11/11/2020 in [facility]  C-SSRS RISK CATEGORY High Risk No Risk       Total Time spent with patient: 30 minutes  Musculoskeletal  Strength & Muscle Tone: within normal limits Gait & Station: normal Patient leans: N/A  Psychiatric Specialty Exam  Presentation General Appearance: Disheveled; Appropriate for Environment  Eye Contact:Good  Speech:Clear and Coherent; Normal Rate  Speech Volume:Normal  Handedness:No data recorded  Mood and Affect  Mood:Depressed  Affect:Congruent   Thought Process  Thought Processes:Coherent; Goal Directed; Linear  Descriptions of Associations:Intact  Orientation:Full (Time, Place and Person)  Thought Content:Logical; WDL  Diagnosis of Schizophrenia or Schizoaffective disorder in past: No   Hallucinations:Hallucinations: None  Ideas of Reference:None  Suicidal Thoughts:Suicidal Thoughts: Yes, Active (See HPI for details.) SI Active Intent and/or Plan: With Intent; With Plan; With Means to Carry Out; With Access to Means  Homicidal Thoughts:Homicidal Thoughts: No   Sensorium   Memory:Immediate Fair; Recent Fair; Remote Fair  Judgment:Fair  Insight:Fair   Executive Functions  Concentration:Good  Attention Span:Fair; Good  Recall:Fair  Fund of Knowledge:Fair  Language:Good   Psychomotor Activity  Psychomotor Activity:Psychomotor Activity: Normal   Assets  Assets:Communication Skills; Desire for Improvement; Housing; Leisure Time; Physical Health; Resilience; Vocational/Educational   Sleep  Sleep:Sleep: Fair Number of Hours of Sleep: 6   Nutritional Assessment (For OBS and FBC admissions only) Has the patient had a weight loss or gain of 10 pounds or more in the last 3 months?: Yes Has the patient had a decrease in food intake/or appetite?: Yes Does the patient have dental problems?: No Does the patient have eating habits or behaviors that may be indicators of an eating disorder including binging or inducing vomiting?: No Has the patient recently lost weight without trying?: Yes, 14-23 lbs. Has the patient been eating poorly because of a decreased appetite?: Yes Malnutrition Screening Tool Score: 3 Nutritional Assessment Referrals: Refer to Primary Care Provider    Physical Exam Vitals reviewed.  Constitutional:      General: He is not in acute distress.    Appearance: He is not ill-appearing, toxic-appearing or diaphoretic.  HENT:     Head: Normocephalic and atraumatic.     Right Ear: External ear normal.  Left Ear: External ear normal.  Cardiovascular:     Rate and Rhythm: Normal rate.  Pulmonary:     Effort: Pulmonary effort is normal. No respiratory distress.  Musculoskeletal:        General: Normal range of motion.     Cervical back: Normal range of motion.  Neurological:     Mental Status: He is alert and oriented to person, place, and time.     Comments: No tremor noted.   Psychiatric:        Attention and Perception: Attention and perception normal. He does not perceive auditory or visual hallucinations.         Mood and Affect: Mood is depressed.        Speech: Speech normal.        Behavior: Behavior normal. Behavior is not agitated, slowed, aggressive, withdrawn, hyperactive or combative. Behavior is cooperative.        Thought Content: Thought content is not paranoid or delusional. Thought content includes suicidal ideation. Thought content does not include homicidal ideation. Thought content includes suicidal plan.     Comments: Affect mood congruent.     Review of Systems  Constitutional: Positive for malaise/fatigue and weight loss. Negative for chills, diaphoresis and fever.  HENT: Negative for congestion.   Respiratory: Negative for cough and shortness of breath.   Gastrointestinal: Negative for abdominal pain, constipation, diarrhea, nausea and vomiting.  Musculoskeletal: Negative for joint pain and myalgias.  Neurological: Negative for dizziness and headaches.  Psychiatric/Behavioral: Positive for depression and suicidal ideas. Negative for hallucinations, memory loss and substance abuse. The patient has insomnia.   All other systems reviewed and are negative.   Vitals: Blood pressure 101/77, pulse 64, temperature 97.6 F (36.4 C), temperature source Tympanic, resp. rate 18, SpO2 98 %. There is no height or weight on file to calculate BMI.  Past Psychiatric History: MDD, anxiety, past suicide attempts    Is the patient at risk to self? Yes  Has the patient been a risk to self in the past 6 months? No .    Has the patient been a risk to self within the distant past? Yes   Is the patient a risk to others? No   Has the patient been a risk to others in the past 6 months? No   Has the patient been a risk to others within the distant past? No   Past Medical History:  Past Medical History:  Diagnosis Date  . Anxiety   . Depression   . Substance abuse (HCC)   . Suicide attempt (HCC)    x 6 per pt  . Thyroid disease    hypo    Past Surgical History:  Procedure Laterality Date  .  punctured lung      Family History:  Family History  Problem Relation Age of Onset  . Hyperlipidemia Mother   . Cancer Mother   . Cancer Father   . Diabetes Maternal Grandmother     Social History:  Social History   Socioeconomic History  . Marital status: Single    Spouse name: Not on file  . Number of children: Not on file  . Years of education: Not on file  . Highest education level: Not on file  Occupational History  . Not on file  Tobacco Use  . Smoking status: Current Every Day Smoker    Packs/day: 1.00    Types: Cigarettes  . Smokeless tobacco: Never Used  Substance and Sexual Activity  .  Alcohol use: No  . Drug use: No  . Sexual activity: Never    Birth control/protection: Abstinence  Other Topics Concern  . Not on file  Social History Narrative  . Not on file   Social Determinants of Health   Financial Resource Strain: Not on file  Food Insecurity: Not on file  Transportation Needs: Not on file  Physical Activity: Not on file  Stress: Not on file  Social Connections: Not on file  Intimate Partner Violence: Not on file    SDOH:  SDOH Screenings   Alcohol Screen: Not on file  Depression (GNF6-2(PHQ2-9): Not on file  Financial Resource Strain: Not on file  Food Insecurity: Not on file  Housing: Not on file  Physical Activity: Not on file  Social Connections: Not on file  Stress: Not on file  Tobacco Use: High Risk  . Smoking Tobacco Use: Current Every Day Smoker  . Smokeless Tobacco Use: Never Used  Transportation Needs: Not on file    Last Labs:  Admission on 02/07/2021, Discharged on 02/08/2021  Component Date Value Ref Range Status  . Sodium 02/07/2021 136  135 - 145 mmol/L Final  . Potassium 02/07/2021 3.6  3.5 - 5.1 mmol/L Final  . Chloride 02/07/2021 101  98 - 111 mmol/L Final  . CO2 02/07/2021 23  22 - 32 mmol/L Final  . Glucose, Bld 02/07/2021 123* 70 - 99 mg/dL Final   Glucose reference range applies only to samples taken after fasting  for at least 8 hours.  . BUN 02/07/2021 19  6 - 20 mg/dL Final  . Creatinine, Ser 02/07/2021 1.25* 0.61 - 1.24 mg/dL Final  . Calcium 13/08/657805/28/2022 9.7  8.9 - 10.3 mg/dL Final  . Total Protein 02/07/2021 7.4  6.5 - 8.1 g/dL Final  . Albumin 46/96/295205/28/2022 4.4  3.5 - 5.0 g/dL Final  . AST 84/13/244005/28/2022 26  15 - 41 U/L Final  . ALT 02/07/2021 15  0 - 44 U/L Final  . Alkaline Phosphatase 02/07/2021 70  38 - 126 U/L Final  . Total Bilirubin 02/07/2021 0.8  0.3 - 1.2 mg/dL Final  . GFR, Estimated 02/07/2021 >60  >60 mL/min Final   Comment: (NOTE) Calculated using the CKD-EPI Creatinine Equation (2021)   . Anion gap 02/07/2021 12  5 - 15 Final   Performed at [facility], [address] [place], [place] [address]  . Alcohol, Ethyl (B) 02/07/2021 <10  <10 mg/dL Final   Comment: (NOTE) Lowest detectable limit for serum alcohol is 10 mg/dL.  For medical purposes only. Performed at [facility], [address] [place], [place] [address]   . Salicylate Lvl 02/07/2021 <7.0* 7.0 - 30.0 mg/dL Final   Performed at [facility], [address] [place], [place] [address]  . Acetaminophen (Tylenol), Serum 02/07/2021 <10* 10 - 30 ug/mL Final   Comment: (NOTE) Therapeutic concentrations vary significantly. A range of 10-30 ug/mL  may be an effective concentration for many patients. However, some  are best treated at concentrations outside of this range. Acetaminophen concentrations >150 ug/mL at 4 hours after ingestion  and >50 ug/mL at 12 hours after ingestion are often associated with  toxic reactions.  Performed at [facility], [address] [place], [place] [address]   . WBC 02/07/2021 8.8  4.0 - 10.5 K/uL Final  . RBC 02/07/2021 4.89  4.22 - 5.81 MIL/uL Final  . Hemoglobin 02/07/2021 15.3  13.0 - 17.0 g/dL Final  .  HCT 02/07/2021 45.6  39.0 - 52.0 % Final  . MCV 02/07/2021 93.3  80.0 - 100.0 fL Final  . MCH 02/07/2021 31.3  26.0 - 34.0 pg Final  . MCHC 02/07/2021 33.6   30.0 - 36.0 g/dL Final  . RDW 16/06/9603 12.1  11.5 - 15.5 % Final  . Platelets 02/07/2021 286  150 - 400 K/uL Final  . nRBC 02/07/2021 0.0  0.0 - 0.2 % Final   Performed at [facility], [address], [place], [place] [address]  . Opiates 02/07/2021 NONE DETECTED  NONE DETECTED Final  . Cocaine 02/07/2021 POSITIVE* NONE DETECTED Final  . Benzodiazepines 02/07/2021 NONE DETECTED  NONE DETECTED Final  . Amphetamines 02/07/2021 NONE DETECTED  NONE DETECTED Final  . Tetrahydrocannabinol 02/07/2021 POSITIVE* NONE DETECTED Final  . Barbiturates 02/07/2021 NONE DETECTED  NONE DETECTED Final   Comment: (NOTE) DRUG SCREEN FOR MEDICAL PURPOSES ONLY.  IF CONFIRMATION IS NEEDED FOR ANY PURPOSE, NOTIFY LAB WITHIN 5 DAYS.  LOWEST DETECTABLE LIMITS FOR URINE DRUG SCREEN Drug Class                     Cutoff (ng/mL) Amphetamine and metabolites    1000 Barbiturate and metabolites    200 Benzodiazepine                 200 Tricyclics and metabolites     300 Opiates and metabolites        300 Cocaine and metabolites        300 THC                            50 Performed at [facility], [address] [place], [place] [address]   . SARS Coronavirus 2 by RT PCR 02/07/2021 NEGATIVE  NEGATIVE Final   Comment: (NOTE) SARS-CoV-2 target nucleic acids are NOT DETECTED.  The SARS-CoV-2 RNA is generally detectable in upper respiratory specimens during the acute phase of infection. The lowest concentration of SARS-CoV-2 viral copies this assay can detect is 138 copies/mL. A negative result does not preclude SARS-Cov-2 infection and should not be used as the sole basis for treatment or other patient management decisions. A negative result may occur with  improper specimen collection/handling, submission of specimen other than nasopharyngeal swab, presence of viral mutation(s) within the areas targeted by this assay, and inadequate number of viral copies(<138 copies/mL). A negative result  must be combined with clinical observations, patient history, and epidemiological information. The expected result is Negative.  Fact Sheet for Patients:  BloggerCourse.com  Fact Sheet for Healthcare Providers:  SeriousBroker.it  This test is no                          t yet approved or cleared by the [place] FDA and  has been authorized for detection and/or diagnosis of SARS-CoV-2 by FDA under an Emergency Use Authorization (EUA). This EUA will remain  in effect (meaning this test can be used) for the duration of the COVID-19 declaration under Section 564(b)(1) of the Act, 21 U.S.C.section 360bbb-3(b)(1), unless the authorization is terminated  or revoked sooner.      . Influenza A by PCR 02/07/2021 NEGATIVE  NEGATIVE Final  . Influenza B by PCR 02/07/2021 NEGATIVE  NEGATIVE Final   Comment: (NOTE) The Xpert Xpress SARS-CoV-2/FLU/RSV plus assay is intended as an aid in the diagnosis of influenza from  Nasopharyngeal swab specimens and should not be used as a sole basis for treatment. Nasal washings and aspirates are unacceptable for Xpert Xpress SARS-CoV-2/FLU/RSV testing.  Fact Sheet for Patients: BloggerCourse.com  Fact Sheet for Healthcare Providers: SeriousBroker.it  This test is not yet approved or cleared by the [place] FDA and has been authorized for detection and/or diagnosis of SARS-CoV-2 by FDA under an Emergency Use Authorization (EUA). This EUA will remain in effect (meaning this test can be used) for the duration of the COVID-19 declaration under Section 564(b)(1) of the Act, 21 U.S.C. section 360bbb-3(b)(1), unless the authorization is terminated or revoked.  Performed at [facility], [address], [place], [place] [address]   Admission on 11/11/2020, Discharged on 11/11/2020  Component Date Value Ref Range Status  . WBC 11/11/2020 5.9   4.0 - 10.5 K/uL Final  . RBC 11/11/2020 4.84  4.22 - 5.81 MIL/uL Final  . Hemoglobin 11/11/2020 15.3  13.0 - 17.0 g/dL Final  . HCT 55/73/2202 44.7  39.0 - 52.0 % Final  . MCV 11/11/2020 92.4  80.0 - 100.0 fL Final  . MCH 11/11/2020 31.6  26.0 - 34.0 pg Final  . MCHC 11/11/2020 34.2  30.0 - 36.0 g/dL Final  . RDW 54/27/0623 11.8  11.5 - 15.5 % Final  . Platelets 11/11/2020 236  150 - 400 K/uL Final  . nRBC 11/11/2020 0.0  0.0 - 0.2 % Final  . Neutrophils Relative % 11/11/2020 69  % Final  . Neutro Abs 11/11/2020 4.1  1.7 - 7.7 K/uL Final  . Lymphocytes Relative 11/11/2020 21  % Final  . Lymphs Abs 11/11/2020 1.3  0.7 - 4.0 K/uL Final  . Monocytes Relative 11/11/2020 9  % Final  . Monocytes Absolute 11/11/2020 0.5  0.1 - 1.0 K/uL Final  . Eosinophils Relative 11/11/2020 0  % Final  . Eosinophils Absolute 11/11/2020 0.0  0.0 - 0.5 K/uL Final  . Basophils Relative 11/11/2020 1  % Final  . Basophils Absolute 11/11/2020 0.1  0.0 - 0.1 K/uL Final  . Immature Granulocytes 11/11/2020 0  % Final  . Abs Immature Granulocytes 11/11/2020 0.01  0.00 - 0.07 K/uL Final   Performed at [facility], [address] [place], [place] [address]  . Sodium 11/11/2020 137  135 - 145 mmol/L Final  . Potassium 11/11/2020 4.0  3.5 - 5.1 mmol/L Final  . Chloride 11/11/2020 101  98 - 111 mmol/L Final  . CO2 11/11/2020 24  22 - 32 mmol/L Final  . Glucose, Bld 11/11/2020 99  70 - 99 mg/dL Final   Glucose reference range applies only to samples taken after fasting for at least 8 hours.  . BUN 11/11/2020 14  6 - 20 mg/dL Final  . Creatinine, Ser 11/11/2020 0.89  0.61 - 1.24 mg/dL Final  . Calcium 15/17/6160 9.1  8.9 - 10.3 mg/dL Final  . Total Protein 11/11/2020 7.3  6.5 - 8.1 g/dL Final  . Albumin 73/71/0626 4.3  3.5 - 5.0 g/dL Final  . AST 94/85/4627 16  15 - 41 U/L Final  . ALT 11/11/2020 13  0 - 44 U/L Final  . Alkaline Phosphatase 11/11/2020 62  38 - 126 U/L Final  . Total Bilirubin  11/11/2020 0.9  0.3 - 1.2 mg/dL Final  . GFR, Estimated 11/11/2020 >60  >60 mL/min Final   Comment: (NOTE) Calculated using the CKD-EPI Creatinine Equation (2021)   . Anion gap 11/11/2020 12  5 - 15 Final  Performed at [facility], [address] [place], [place] [address]  . Lipase 11/11/2020 28  11 - 51 U/L Final   Performed at [facility], [address], [place], [place] [address]    Allergies: Patient has no known allergies.  PTA Medications: (Not in a hospital admission)   Medical Decision Making  Patient is a 55 year old male with history of MDD, anxiety and past suicide attempts who presents to the [facility] as a voluntary direct admit transfer from [facility] emergency department (see HPI for further details if necessary) for SI with a plan.  Patient was recommended for inpatient psychiatric treatment upon 02/07/2021 TTS assessment.  Based on patient's history, patient's current presentation, TTS assessment, and my assessment, believe that the patient is experiencing depression that is severely negatively impacting his activities of daily living and I agree with the recommendation for inpatient psychiatric treatment for this patient.    Recommendations  Based on my evaluation the patient does not appear to have an emergency medical condition.  Patient was medically cleared in the emergency department. Patient will be placed in [facility] continuous observation/assessment for further stabilization and treatment while waiting for placement for inpatient psychiatric treatment. 02/07/21 ED labs reviewed:  -CMP: Creatinine slightly elevated at 1.25 mg/dL (essentially normal), CMP otherwise unremarkable  -Ethanol, salicylate, and Tylenol levels: Within normal limits  -CBC: Within normal limits  -UDS: Positive for cocaine and THC  -PCR COVID: Negative The following labs were ordered to be drawn at 0800 on the morning of 02/08/2021:  -Hemoglobin A1c, lipid  panel, and TSH due to patient's history of taking antipsychotic medication (Abilify)   -Valproic acid level due to patient's history of taking Depakote and reportedly not taking his Depakote for the past 2 to 3 weeks.  EKG ordered to assess patient's baseline QTC due to patient's history of taking antipsychotic medication.  Patient's EKG shows normal sinus rhythm with no acute/concerning findings or signs of ischemia, with ventricular rate of 66 bpm, PR interval of 146 ms, QRS 90 ms, QT/QTC 404/423 ms (appropriate QTC to resume antipsychotic medication).   Will restart/continue the following home medications:  -Depakote ER 24-hour tablet 1000 mg p.o. daily at bedtime for mood stability.  Patient states that he has not taken any of his medications in the past 2 to 3 weeks.  Of note, patient had 1 dose of Depakote 1000 mg p.o. at 2139 on 02/07/2021 and patient tolerated this restarting dose of Depakote with no adverse effects.  Thus, will continue patient's Depakote ER 1000 mg p.o. daily at bedtime.   -Patient's scheduled home medication of Xanax 1 mg p.o. 3 times daily ordered for Xanax 1 mg p.o. 3 times daily as needed for anxiety  -Abilify 5 mg p.o. daily at bedtime for mood stability  -Lipitor 40 mg p.o. every evening for high cholesterol/hyperlipidemia  -Bentyl 20 mg p.o. 3 times daily for IBS  -Pepcid 40 mg p.o. daily at bedtime for GERD/gastritis  -Synthroid 25 mcg p.o. daily for hypothyroidism  -Nicotine patch 21 mg transdermal (administered over 24 hours) daily as needed for smoking cessation  -Zoloft 200 mg p.o. daily at bedtime for MDD  -Flomax 0.4 mg p.o. daily for BPH (formulary alternative the patient's home medication of Rapaflo 8 mg p.o. at bedtime)   Trazodone 50 mg p.o. at bedtime as needed ordered for sleep  Will not restart patient's Adderall at this time due to patient reportedly not taking this medication for nearly 1  year  Patient's nutritional screening assessment score is a  3, which warrants further evaluation of patient's nutritional/eating habits. Recommend that patient follow up with his PCP upon future discharge for further evaluation of nutritional/eating habits and recommend that patient receive referral for PCP upon future discharge if he does not currently have a PCP in order to have his nutritional/eating habits further evaluated.   Jaclyn Shaggy, PA-C 02/08/21  3:23 AM

## 2021-02-08 NOTE — ED Notes (Signed)
Pt given breakfast.

## 2021-02-08 NOTE — ED Provider Notes (Signed)
FBC/OBS ASAP Discharge Summary  Date and Time: 02/08/2021 11:34 AM  Name: Christopher Giles  MRN:  [id number]   Discharge Diagnoses:  Final diagnoses:  Severe episode of recurrent major depressive disorder, without psychotic features (HCC)  Suicidal ideation   Christopher Giles continues to report suicidal ideations.  States he has been off his medications because he has been on able to afford them.  States he was followed by Dr. Simmie Davies however has not followed up in the past 3 months.  Reports history of suicide attempts.  Patient was accepted to [facility] unit, will restart medications where appropriate for transfer.   Per HPI:  Christopher Giles is a 55 year old male with history of MDD, anxiety, and multiple past suicide attempts who presents to the [facility] as a voluntary direct admit transfer from [facility] Emergency Department. Per chart review, patient presented to [facility] ED on 02/07/21 for SI. Patient was assessed by TTS on 02/07/21 while patient was in the ED and inpatient psychiatric treatment was recommended for the patient upon 02/07/21 TTS assessment (Please see Dian Situ, LCAS 02/07/21 BH Assessment note for further details regarding TTS assessment if necessary). It was then recommended by Cecilio Asper, NP and myself for patient to be transferred to [facility] for continuous observation/assessment while waiting for placement for inpatient psychiatric treatment.   Total Time spent with patient: 15 minutes  Past Psychiatric History: Past Medical History:  Past Medical History:  Diagnosis Date  . Anxiety   . Depression   . Substance abuse (HCC)   . Suicide attempt (HCC)    x 6 per pt  . Thyroid disease    hypo    Past Surgical History:  Procedure Laterality Date  . punctured lung     Family History:  Family History  Problem Relation Age of Onset  . Hyperlipidemia Mother   . Cancer Mother   . Cancer Father   . Diabetes Maternal Grandmother    Family  Psychiatric History:  Social History:  Social History   Substance and Sexual Activity  Alcohol Use No     Social History   Substance and Sexual Activity  Drug Use No    Social History   Socioeconomic History  . Marital status: Single    Spouse name: Not on file  . Number of children: Not on file  . Years of education: Not on file  . Highest education level: Not on file  Occupational History  . Not on file  Tobacco Use  . Smoking status: Current Every Day Smoker    Packs/day: 1.00    Types: Cigarettes  . Smokeless tobacco: Never Used  Substance and Sexual Activity  . Alcohol use: No  . Drug use: No  . Sexual activity: Never    Birth control/protection: Abstinence  Other Topics Concern  . Not on file  Social History Narrative  . Not on file   Social Determinants of Health   Financial Resource Strain: Not on file  Food Insecurity: Not on file  Transportation Needs: Not on file  Physical Activity: Not on file  Stress: Not on file  Social Connections: Not on file   SDOH:  SDOH Screenings   Alcohol Screen: Not on file  Depression (HQI6-9): Not on file  Financial Resource Strain: Not on file  Food Insecurity: Not on file  Housing: Not on file  Physical Activity: Not on file  Social Connections: Not on file  Stress: Not on file  Tobacco Use: High Risk  . Smoking Tobacco Use: Current Every Day Smoker  . Smokeless Tobacco Use: Never Used  Transportation Needs: Not on file    Has this patient used any form of tobacco in the last 30 days? (Cigarettes, Smokeless Tobacco, Cigars, and/or Pipes) A prescription for an FDA-approved tobacco cessation medication was offered at discharge and the patient refused  Current Medications:  Current Facility-Administered Medications  Medication Dose Route Frequency Provider Last Rate Last Admin  . acetaminophen (TYLENOL) tablet 650 mg  650 mg Oral Q6H PRN Jaclyn Shaggyaylor, Cody W, PA-C      . ALPRAZolam Prudy Feeler(XANAX) tablet 1 mg  1 mg Oral  TID PRN Jaclyn Shaggyaylor, Cody W, PA-C      . alum & mag hydroxide-simeth (MAALOX/MYLANTA) 200-200-20 MG/5ML suspension 30 mL  30 mL Oral Q4H PRN Melbourne Abtsaylor, Cody W, PA-C      . ARIPiprazole (ABILIFY) tablet 5 mg  5 mg Oral QHS Ladona Ridgelaylor, Cody W, PA-C      . atorvastatin (LIPITOR) tablet 40 mg  40 mg Oral QPM Melbourne Abtsaylor, Cody W, PA-C      . dicyclomine (BENTYL) tablet 20 mg  20 mg Oral TID Jaclyn Shaggyaylor, Cody W, PA-C   20 mg at 02/08/21 [id number]  . divalproex (DEPAKOTE ER) 24 hr tablet 1,000 mg  1,000 mg Oral QHS Ladona Ridgelaylor, Cody W, PA-C      . famotidine (PEPCID) tablet 40 mg  40 mg Oral QHS Melbourne Abtsaylor, Cody W, PA-C      . levothyroxine (SYNTHROID) tablet 25 mcg  25 mcg Oral Q0600 Jaclyn Shaggyaylor, Cody W, PA-C   25 mcg at 02/08/21 [id number]  . magnesium hydroxide (MILK OF MAGNESIA) suspension 30 mL  30 mL Oral Daily PRN Melbourne Abtsaylor, Cody W, PA-C      . nicotine (NICODERM CQ - dosed in mg/24 hours) patch 21 mg  21 mg Transdermal Daily PRN Melbourne Abtsaylor, Cody W, PA-C      . sertraline (ZOLOFT) tablet 200 mg  200 mg Oral QHS Melbourne Abtsaylor, Cody W, PA-C      . tamsulosin (FLOMAX) capsule 0.4 mg  0.4 mg Oral Daily Melbourne Abtsaylor, Cody W, PA-C   0.4 mg at 02/08/21 [id number]  . traZODone (DESYREL) tablet 50 mg  50 mg Oral QHS PRN Jaclyn Shaggyaylor, Cody W, PA-C       Current Outpatient Medications  Medication Sig Dispense Refill  . ALPRAZolam (XANAX) 1 MG tablet Take 1 mg by mouth 3 (three) times daily.    Marland Kitchen. amphetamine-dextroamphetamine (ADDERALL) 20 MG tablet Take 20 mg by mouth 3 (three) times daily.    . ARIPiprazole (ABILIFY) 5 MG tablet Take 5 mg by mouth at bedtime.    Marland Kitchen. atorvastatin (LIPITOR) 40 MG tablet Take 40 mg by mouth every evening.    . dicyclomine (BENTYL) 20 MG tablet Take 20 mg by mouth 3 (three) times daily.    . divalproex (DEPAKOTE ER) 500 MG 24 hr tablet Take 1,000 mg by mouth at bedtime.    . famotidine (PEPCID) 40 MG tablet Take 40 mg by mouth at bedtime.    Marland Kitchen. levothyroxine (SYNTHROID) 25 MCG tablet Take 25 mcg by mouth daily before breakfast.    . naproxen sodium  (ALEVE) 220 MG tablet Take 220-440 mg by mouth 2 (two) times daily as needed (for pain or headaches).    . Nicotine (NICODERM CQ TD) Place 1 patch onto the skin daily as needed (to aid with smoking cessation, while hospitalized).    Marland Kitchen. RAPAFLO 8 MG CAPS capsule Take  8 mg by mouth at bedtime.    . sertraline (ZOLOFT) 100 MG tablet Take 1 tablet (100 mg total) by mouth daily. (Patient taking differently: Take 200 mg by mouth at bedtime.) 30 tablet 0    PTA Medications: (Not in a hospital admission)   Musculoskeletal  Strength & Muscle Tone: within normal limits Gait & Station: normal Patient leans: N/A  Psychiatric Specialty Exam  Presentation  General Appearance: Disheveled; Appropriate for Environment  Eye Contact:Good  Speech:Clear and Coherent; Normal Rate  Speech Volume:Normal  Handedness:No data recorded  Mood and Affect  Mood:Depressed  Affect:Congruent   Thought Process  Thought Processes:Coherent; Goal Directed; Linear  Descriptions of Associations:Intact  Orientation:Full (Time, Place and Person)  Thought Content:Logical; WDL  Diagnosis of Schizophrenia or Schizoaffective disorder in past: No    Hallucinations:Hallucinations: None  Ideas of Reference:None  Suicidal Thoughts:Suicidal Thoughts: Yes, Active (See HPI for details.) SI Active Intent and/or Plan: With Intent; With Plan; With Means to Carry Out; With Access to Means  Homicidal Thoughts:Homicidal Thoughts: No   Sensorium  Memory:Immediate Fair; Recent Fair; Remote Fair  Judgment:Fair  Insight:Fair   Executive Functions  Concentration:Good  Attention Span:Fair; Good  Recall:Fair  Fund of Knowledge:Fair  Language:Good   Psychomotor Activity  Psychomotor Activity:Psychomotor Activity: Normal   Assets  Assets:Communication Skills; Desire for Improvement; Housing; Leisure Time; Physical Health; Resilience; Vocational/Educational   Sleep  Sleep:Sleep: Fair Number of Hours of  Sleep: 6   Nutritional Assessment (For OBS and FBC admissions only) Has the patient had a weight loss or gain of 10 pounds or more in the last 3 months?: Yes Has the patient had a decrease in food intake/or appetite?: Yes Does the patient have dental problems?: No Does the patient have eating habits or behaviors that may be indicators of an eating disorder including binging or inducing vomiting?: No Has the patient recently lost weight without trying?: Yes, 14-23 lbs. Has the patient been eating poorly because of a decreased appetite?: Yes Malnutrition Screening Tool Score: 3 Nutritional Assessment Referrals: Refer to Primary Care Provider    Physical Exam  Physical Exam Vitals and nursing note reviewed.  Cardiovascular:     Rate and Rhythm: Normal rate and regular rhythm.     Pulses: Normal pulses.     Heart sounds: Normal heart sounds.  Pulmonary:     Effort: Pulmonary effort is normal.  Neurological:     Mental Status: He is alert.  Psychiatric:        Attention and Perception: Attention normal.        Mood and Affect: Mood normal.        Speech: Speech normal.        Behavior: Behavior normal.        Thought Content: Thought content includes suicidal ideation. Thought content includes suicidal plan.        Cognition and Memory: Cognition normal.        Judgment: Judgment normal.    Review of Systems  Psychiatric/Behavioral: Positive for depression, substance abuse and suicidal ideas. Negative for hallucinations. The patient is nervous/anxious and has insomnia.   All other systems reviewed and are negative.  Blood pressure 101/77, pulse 94, temperature 97.8 F (36.6 C), temperature source Oral, resp. rate 16, SpO2 99 %. There is no height or weight on file to calculate BMI.  Demographic Factors:  Male and Caucasian  Loss Factors: Loss of significant relationship and Financial problems/change in socioeconomic status  Historical Factors: Prior suicide  attempts  Risk Reduction Factors:   NA  Continued Clinical Symptoms:  Depression:   Hopelessness  Cognitive Features That Contribute To Risk:  Closed-mindedness    Suicide Risk:  Minimal: No identifiable suicidal ideation.  Patients presenting with no risk factors but with morbid ruminations; may be classified as minimal risk based on the severity of the depressive symptoms  Plan Of Care/Follow-up recommendations:  Activity:  as tolerated Diet:  heart healthy  Disposition: Take all medications as prescribed. Keep all follow-up appointments as scheduled.  Do not consume alcohol or use illegal drugs while on prescription medications. Report any adverse effects from your medications to your primary care provider promptly.  In the event of recurrent symptoms or worsening symptoms, call 911, a crisis hotline, or go to the nearest emergency department for evaluation.   Oneta Rack, NP 02/08/2021, 11:34 AM

## 2021-02-08 NOTE — Tx Team (Signed)
Initial Treatment Plan 02/08/2021 5:08 PM Christopher Giles [id number]    PATIENT STRESSORS: Financial difficulties Substance abuse   PATIENT STRENGTHS: Ability for insight Capable of independent living Communication skills General fund of knowledge Work skills   PATIENT IDENTIFIED PROBLEMS: anxiety  Depression  SI                 DISCHARGE CRITERIA:  Ability to meet basic life and health needs Adequate post-discharge living arrangements Improved stabilization in mood, thinking, and/or behavior Safe-care adequate arrangements made  PRELIMINARY DISCHARGE PLAN: Return to previous living arrangement Return to previous work or school arrangements  PATIENT/FAMILY INVOLVEMENT: This treatment plan has been presented to and reviewed with the patient, Christopher Giles .The patient and family have been given the opportunity to ask questions and make suggestions.  Wardell Heath, RN 02/08/2021, 5:08 PM

## 2021-02-08 NOTE — Discharge Instructions (Signed)
Take all medications as prescribed. Keep all follow-up appointments as scheduled.  Do not consume alcohol or use illegal drugs while on prescription medications. Report any adverse effects from your medications to your primary care provider promptly.  In the event of recurrent symptoms or worsening symptoms, call 911, a crisis hotline, or go to the nearest emergency department for evaluation.   

## 2021-02-09 ENCOUNTER — Encounter (HOSPITAL_COMMUNITY): Payer: Self-pay | Admitting: Emergency Medicine

## 2021-02-09 DIAGNOSIS — F1420 Cocaine dependence, uncomplicated: Secondary | ICD-10-CM

## 2021-02-09 HISTORY — DX: Cocaine dependence, uncomplicated: F14.20

## 2021-02-09 MED ORDER — ADULT MULTIVITAMIN W/MINERALS CH
1.0000 | ORAL_TABLET | Freq: Every day | ORAL | Status: DC
  Administered 2021-02-09 – 2021-02-15 (×7): 1 via ORAL
  Filled 2021-02-09: qty 7
  Filled 2021-02-09 (×10): qty 1

## 2021-02-09 MED ORDER — LOPERAMIDE HCL 2 MG PO CAPS: 2.0000 mg | ORAL_CAPSULE | ORAL | Status: AC | PRN

## 2021-02-09 MED ORDER — THIAMINE HCL 100 MG/ML IJ SOLN
100.0000 mg | Freq: Once | INTRAMUSCULAR | Status: AC
  Administered 2021-02-09: 100 mg via INTRAMUSCULAR
  Filled 2021-02-09: qty 2

## 2021-02-09 MED ORDER — ONDANSETRON 4 MG PO TBDP: 4.0000 mg | ORAL_TABLET | Freq: Four times a day (QID) | ORAL | Status: AC | PRN

## 2021-02-09 MED ORDER — THIAMINE HCL 100 MG PO TABS
100.0000 mg | ORAL_TABLET | Freq: Every day | ORAL | Status: DC
  Administered 2021-02-10 – 2021-02-15 (×6): 100 mg via ORAL
  Filled 2021-02-09 (×8): qty 1

## 2021-02-09 MED ORDER — ALPRAZOLAM 0.5 MG PO TABS
0.5000 mg | ORAL_TABLET | Freq: Three times a day (TID) | ORAL | Status: DC
  Administered 2021-02-09 (×2): 0.5 mg via ORAL
  Filled 2021-02-09 (×2): qty 1

## 2021-02-09 MED ORDER — HYDROXYZINE HCL 25 MG PO TABS
25.0000 mg | ORAL_TABLET | Freq: Four times a day (QID) | ORAL | Status: AC | PRN
  Administered 2021-02-09 – 2021-02-11 (×3): 25 mg via ORAL
  Filled 2021-02-09 (×3): qty 1

## 2021-02-09 MED ORDER — LORAZEPAM 1 MG PO TABS: 1.0000 mg | ORAL_TABLET | Freq: Four times a day (QID) | ORAL | Status: AC | PRN

## 2021-02-09 MED ORDER — NICOTINE 21 MG/24HR TD PT24
21.0000 mg | MEDICATED_PATCH | Freq: Every day | TRANSDERMAL | Status: DC
  Administered 2021-02-09 – 2021-02-15 (×7): 21 mg via TRANSDERMAL
  Filled 2021-02-09 (×8): qty 1

## 2021-02-09 NOTE — [facility] Suicide Risk Assessment (Signed)
[facility] Admission Suicide Risk Assessment   Nursing information obtained from:  Patient Demographic factors:  Male,Caucasian,Low socioeconomic status,Living alone Current Mental Status:  Suicidal ideation indicated by patient,Suicide plan,Self-harm thoughts Loss Factors:  Financial problems / change in socioeconomic status Historical Factors:  Impulsivity,Prior suicide attempts,Family history of mental illness or substance abuse Risk Reduction Factors:  Positive social support,Sense of responsibility to family  Total Time spent with patient: 30 minutes Principal Problem: MDD (major depressive disorder) Diagnosis:  Principal Problem:   MDD (major depressive disorder) Active Problems:   Moderate cocaine use disorder (HCC)  Subjective Data: Medical record reviewed.  Patient's case discussed in detail with members of the treatment team.  I met with and evaluated the patient in the office on the unit today.  Christopher Giles is a 55 year old male a history of depression, anxiety, hypothyroidism and cocaine use disorder who presented to [name] ED with worsening depressive symptoms and suicidal ideation with a plan to jump off the bridge in the context of multiple psychosocial stressors and relapse on cocaine.  In the ED patient reported he had not been taking his medications for 2 weeks prior to ED presentation.  On evaluation in the ED, the patient reported suicidal ideation with multiple potential plans including jumping off a bridge or running into traffic.  He reported to home medication regimen of Zoloft 200 mg at bedtime, Abilify 5 mg at bedtime, Depakote ER 1000 mg at bedtime and Xanax 1 mg 3 times daily PRN.  The patient was transferred to [facility] for further treatment of worsening depressive symptoms and suicidal ideation.  On interview with me the patient reports that over the past few months he has had a lot of stressors.  He has experienced gastritis which caused him to miss a lot of work, a friend  died suddenly and power was cut off.  The patient states that he experienced worsening symptoms of depression and additionally he had a relapse on cocaine over the past week.  He has experienced symptoms of depressed mood, hypersomnia, decreased appetite, decreased interest, problems concentrating, low energy, difficulty showering, hopelessness, racing thoughts and suicidal ideation.  Patient states suicidal ideations started to occur 3 to 4 days ago and he had thoughts of jumping off a bridge.  Currently he continues with passive wishes not to be alive but denies any active suicidal ideation in the hospital.  He denies AI, HI, AH, VH or PI.  Patient reports that he recently has been smoking cocaine 1/2 g/day and using marijuana a few times per week.  He denies other drug use or alcohol use.  Patient states that he does not consistently take Xanax 1 mg 3 times a day but mostly takes 1 mg twice a day.  PDMP indicates a prescription for Xanax 1 mg number 90 tablets for 30 days was filled on 01/12/2021.  Patient reports prior psychiatric diagnoses of depression and anxiety.  He reports a history of between 6 and 8 prior inpatient psychiatric admissions mostly for depression and suicidal ideation.  The patient states he has made roughly 6 suicide attempts during his lifetime the most recent of which was 5 years ago when he took a bottle of Tylenol and an overdose but threw up.  Most of his suicide attempts have been by overdose or by cutting his wrist.  He denies any history of nonsuicidal self-injurious behavior.  The patient reports a family history of depression and anxiety in his mother, sister and brother.  He reports  a history of alcohol use and his sister and brother.  He denies any family history of suicide.   Continued Clinical Symptoms:  Alcohol Use Disorder Identification Test Final Score (AUDIT): 0  The "Alcohol Use Disorders Identification Test", Guidelines for Use in Primary Care, Second Edition.   World Pharmacologist [facility]). Score between 0-7:  no or low risk or alcohol related problems. Score between 8-15:  moderate risk of alcohol related problems. Score between 16-19:  high risk of alcohol related problems. Score 20 or above:  warrants further diagnostic evaluation for alcohol dependence and treatment.   CLINICAL FACTORS:   Severe Anxiety and/or Agitation Depression:   Anhedonia Hopelessness Impulsivity Alcohol/Substance Abuse/Dependencies More than one psychiatric diagnosis Previous Psychiatric Diagnoses and Treatments   Musculoskeletal: Strength & Muscle Tone: within normal limits Gait & Station: normal Patient leans: N/A  Psychiatric Specialty Exam:  Presentation  General Appearance: Disheveled  Eye Contact:Fair  Speech:Clear and Coherent  Speech Volume:Normal  Handedness:No data recorded  Mood and Affect  Mood:Depressed  Affect:Constricted; Congruent   Thought Process  Thought Processes:Coherent; Goal Directed; Linear  Descriptions of Associations:Intact  Orientation:Full (Time, Place and Person)  Thought Content:Logical  History of Schizophrenia/Schizoaffective disorder:No  Duration of Psychotic Symptoms:No data recorded Hallucinations:Hallucinations: None  Ideas of Reference:None  Suicidal Thoughts:Suicidal Thoughts: Yes, Passive SI Active Intent and/or Plan: With Intent; With Plan; With Means to Carry Out; With Access to Means SI Passive Intent and/or Plan: Without Intent; Without Plan  Homicidal Thoughts:Homicidal Thoughts: No   Sensorium  Memory:Immediate Fair; Recent Fair; Remote Fair  Judgment:Fair  Insight:Fair   Executive Functions  Concentration:Fair  Attention Span:Fair  [place]  Language:Good   Psychomotor Activity  Psychomotor Activity:Psychomotor Activity: Normal   Assets  Assets:Communication Skills; Desire for Improvement; Resilience; Physical Health   Sleep   Sleep:Sleep: Good Number of Hours of Sleep: 6.75    Physical Exam: Physical Exam Vitals and nursing note reviewed.  HENT:     Head: Normocephalic and atraumatic.  Pulmonary:     Effort: Pulmonary effort is normal.  Neurological:     General: No focal deficit present.     Mental Status: He is alert and oriented to person, place, and time.    Review of Systems  Constitutional: Negative.   Respiratory: Negative.   Cardiovascular: Negative.   Gastrointestinal: Negative.   Neurological: Negative for dizziness, tremors and seizures.  Psychiatric/Behavioral: Positive for depression, substance abuse and suicidal ideas. Negative for hallucinations. The patient is nervous/anxious.    Blood pressure 105/66, pulse (!) 105, temperature 97.7 F (36.5 C), temperature source Oral, resp. rate 18, height _0  (1.905 m), weight 90.7 kg, SpO2 98 %. Body mass index is 25 kg/m.   COGNITIVE FEATURES THAT CONTRIBUTE TO RISK:  Thought constriction (tunnel vision)    SUICIDE RISK:   Moderate:  Frequent suicidal ideation with limited intensity, and duration, some specificity in terms of plans, no associated intent, good self-control, limited dysphoria/symptomatology, some risk factors present, and identifiable protective factors, including available and accessible social support.  PLAN OF CARE: The patient has been admitted to the 300 inpatient unit.  Continue every 15-minute observation status.  Encouraged participation in group therapy and therapeutic milieu.  Available labs reviewed.  CMP revealed glucose of 123, creatinine of 1.25 and otherwise WNL.  CBC was WNL.  No differential was performed.  Acetaminophen level was <10.  Salicylate level was <7.5.  Influenza A, influenza B and coronavirus test were negative.  Urine drug  screen was positive for cocaine and THC.  EKG has been ordered but not yet performed.  The patient will be restarted on Zoloft 100 mg daily and Abilify 5 mg daily.  We will  restart outpatient alprazolam at decreased dose of 0.5 mg every 8 hours for now.  We will continue his outpatient Synthroid 25 mcg daily and Flomax 0.4 mg daily.  See MAR for additional information.  We will also place on CIWA to cover for any benzodiazepine withdrawal in the event that patient is underreporting his Xanax use.  Estimated length of stay 3 to 5 days.  Patient will need referral to residential substance abuse treatment program or other substance abuse treatment program at the time of discharge.  I certify that inpatient services furnished can reasonably be expected to improve the patient's condition.   Arthor Captain, MD 02/09/2021, 4:21 PM

## 2021-02-09 NOTE — Progress Notes (Signed)
Recreation Therapy Notes  Date:  5.30.22 Time: 0930 Location: 300 Hall Dayroom  Group Topic: Stress Management  Goal Area(s) Addresses:  Patient will identify positive stress management techniques. Patient will identify benefits of using stress management post d/c.  Intervention: Stress Management  Activity :  Meditation.  LRT played a meditation that focused on being persistent in not only meditation but in life as well.  Patients were to listen and follow along as meditation played to fully engage in activity.  Education:  Stress Management, Discharge Planning.   Education Outcome: Acknowledges Education  Clinical Observations/Feedback: Pt did not attend group session.    Caroll Rancher, LRT/CTRS         Caroll Rancher A 02/09/2021 12:03 PM

## 2021-02-09 NOTE — H&P (Signed)
Psychiatric Admission Assessment Adult  Patient Identification: Christopher Giles MRN:  [id number] Date of Evaluation:  02/09/2021 Chief Complaint:  MDD (major depressive disorder) [F32.9] Principal Diagnosis: MDD (major depressive disorder) Diagnosis:  Principal Problem:   MDD (major depressive disorder) Active Problems:   Moderate cocaine use disorder (HCC)  History of Present Illness: Medical record reviewed.  Patient's case discussed in detail with members of the treatment team.  I met with and evaluated the patient in the office on the unit today.  Christopher Giles is a 55 year old male a history of depression, anxiety, hypothyroidism and cocaine use disorder who presented to [name] ED with worsening depressive symptoms and suicidal ideation with a plan to jump off the bridge in the context of multiple psychosocial stressors and relapse on cocaine.  In the ED patient reported he had not been taking his medications for 2 weeks prior to ED presentation.  On evaluation in the ED, the patient reported suicidal ideation with multiple potential plans including jumping off a bridge or running into traffic.  He reported to home medication regimen of Zoloft 200 mg at bedtime, Abilify 5 mg at bedtime, Depakote ER 1000 mg at bedtime and Xanax 1 mg 3 times daily PRN.  The patient was transferred to [facility] for further treatment of worsening depressive symptoms and suicidal ideation.    On interview with me the patient reports that over the past few months he has had a lot of stressors.  He has experienced gastritis which caused him to miss a lot of work, a friend died suddenly and power was cut off.  The patient states that he experienced worsening symptoms of depression and additionally he had a relapse on cocaine over the past week.  He has experienced symptoms of depressed mood, hypersomnia, decreased appetite, decreased interest, problems concentrating, low energy, difficulty showering, hopelessness, racing  thoughts and suicidal ideation.  Patient states suicidal ideations started to occur 3 to 4 days ago and he had thoughts of jumping off a bridge.  Currently he continues with passive wishes not to be alive but denies any active suicidal ideation in the hospital.  He denies AI, HI, AH, VH or PI.  Patient reports that he recently has been smoking cocaine 1/2 g/day and using marijuana a few times per week.  He denies other drug use or alcohol use.  Patient states that he does not consistently take Xanax 1 mg 3 times a day but mostly takes 1 mg twice a day.  PDMP indicates a prescription for Xanax 1 mg number 90 tablets for 30 days was filled on 01/12/2021.  Associated Signs/Symptoms: Depression Symptoms:  depressed mood, anhedonia, hypersomnia, fatigue, difficulty concentrating, hopelessness, suicidal thoughts with specific plan, anxiety, decreased appetite, Duration of Depression Symptoms: Less than two weeks  (Hypo) Manic Symptoms:  Denies Anxiety Symptoms:  Excessive Worry, Panic Symptoms, Psychotic Symptoms:  Denies PTSD Symptoms: NA Total Time spent with patient: 30 minutes  Past Psychiatric History: Patient reports prior psychiatric diagnoses of depression and anxiety.  He reports a history of between 6 and 8 prior inpatient psychiatric admissions mostly for depression and suicidal ideation.  The patient states he has made roughly 6 suicide attempts during his lifetime the most recent of which was 5 years ago when he took a bottle of Tylenol and an overdose but threw up.  Most of his suicide attempts have been by overdose or by cutting his wrist.  He denies any history of nonsuicidal self-injurious behavior.  Is the  patient at risk to self? Yes.    Has the patient been a risk to self in the past 6 months? Yes.    Has the patient been a risk to self within the distant past? Yes.    Is the patient a risk to others? No.  Has the patient been a risk to others in the past 6 months? No.  Has  the patient been a risk to others within the distant past? No.   Prior Inpatient Therapy:   Prior Outpatient Therapy:    Alcohol Screening: 1. How often do you have a drink containing alcohol?: Never 2. How many drinks containing alcohol do you have on a typical day when you are drinking?: 1 or 2 3. How often do you have six or more drinks on one occasion?: Never AUDIT-C Score: 0 4. How often during the last year have you found that you were not able to stop drinking once you had started?: Never 5. How often during the last year have you failed to do what was normally expected from you because of drinking?: Never 6. How often during the last year have you needed a first drink in the morning to get yourself going after a heavy drinking session?: Never 7. How often during the last year have you had a feeling of guilt of remorse after drinking?: Never 8. How often during the last year have you been unable to remember what happened the night before because you had been drinking?: Never 9. Have you or someone else been injured as a result of your drinking?: No 10. Has a relative or friend or a doctor or another health worker been concerned about your drinking or suggested you cut down?: No Alcohol Use Disorder Identification Test Final Score (AUDIT): 0 Substance Abuse History in the last 12 months:  Yes.   Consequences of Substance Abuse: Substance use likely contributed significantly to depressive symptoms and suicidal ideation Previous Psychotropic Medications: Yes  Psychological Evaluations: Yes  Past Medical History:  Past Medical History:  Diagnosis Date  . Anxiety   . Depression   . Moderate cocaine use disorder ([place]) 02/09/2021  . Substance abuse ([place])   . Suicide attempt ([place])    x 6 per pt  . Thyroid disease    hypo    Past Surgical History:  Procedure Laterality Date  . punctured lung     Family History:  Family History  Problem Relation Age of Onset  . Hyperlipidemia  Mother   . Cancer Mother   . Cancer Father   . Diabetes Maternal Grandmother    Family Psychiatric  History: The patient reports a family history of depression and anxiety in his mother, sister and brother.  He reports a history of alcohol use and his sister and brother.  He denies any family history of suicide. Tobacco Screening:   Social History:  Social History   Substance and Sexual Activity  Alcohol Use No     Social History   Substance and Sexual Activity  Drug Use No    Additional Social History:                           Allergies:  No Known Allergies Lab Results: No results found for this or any previous visit (from the past 48 hour(s)).  Blood Alcohol level:  Lab Results  Component Value Date   ETH <10 02/07/2021   ETH <5 56/81/2751    Metabolic  Disorder Labs:  No results found for: HGBA1C, MPG No results found for: PROLACTIN No results found for: CHOL, TRIG, HDL, CHOLHDL, VLDL, LDLCALC  Current Medications: Current Facility-Administered Medications  Medication Dose Route Frequency Provider Last Rate Last Admin  . acetaminophen (TYLENOL) tablet 650 mg  650 mg Oral Q6H PRN Derrill Center, NP      . ALPRAZolam Duanne Moron) tablet 0.5 mg  0.5 mg Oral Q8H Arthor Captain, MD   0.5 mg at 02/09/21 1635  . alum & mag hydroxide-simeth (MAALOX/MYLANTA) 200-200-20 MG/5ML suspension 30 mL  30 mL Oral Q4H PRN Derrill Center, NP      . ARIPiprazole (ABILIFY) tablet 5 mg  5 mg Oral Daily Derrill Center, NP   5 mg at 02/09/21 2706  . famotidine (PEPCID) tablet 20 mg  20 mg Oral QHS Derrill Center, NP   20 mg at 02/08/21 1949  . hydrOXYzine (ATARAX/VISTARIL) tablet 25 mg  25 mg Oral Q6H PRN Arthor Captain, MD      . levothyroxine (SYNTHROID) tablet 25 mcg  25 mcg Oral Q0600 Derrill Center, NP   25 mcg at 02/09/21 2376  . loperamide (IMODIUM) capsule 2-4 mg  2-4 mg Oral PRN Arthor Captain, MD      . LORazepam (ATIVAN) tablet 1 mg  1 mg Oral Q6H PRN Arthor Captain, MD      . magnesium hydroxide (MILK OF MAGNESIA) suspension 30 mL  30 mL Oral Daily PRN Derrill Center, NP      . multivitamin with minerals tablet 1 tablet  1 tablet Oral Daily Arthor Captain, MD   1 tablet at 02/09/21 1819  . nicotine (NICODERM CQ - dosed in mg/24 hours) patch 21 mg  21 mg Transdermal Daily Harlow Asa, MD   21 mg at 02/09/21 0837  . ondansetron (ZOFRAN-ODT) disintegrating tablet 4 mg  4 mg Oral Q6H PRN Arthor Captain, MD      . sertraline (ZOLOFT) tablet 100 mg  100 mg Oral Daily Derrill Center, NP   100 mg at 02/09/21 2831  . tamsulosin (FLOMAX) capsule 0.4 mg  0.4 mg Oral Daily Derrill Center, NP   0.4 mg at 02/09/21 5176  . [START ON 02/10/2021] thiamine tablet 100 mg  100 mg Oral Daily Arthor Captain, MD      . traZODone (DESYREL) tablet 50 mg  50 mg Oral QHS PRN Derrill Center, NP   50 mg at 02/08/21 1950   PTA Medications: Medications Prior to Admission  Medication Sig Dispense Refill Last Dose  . atorvastatin (LIPITOR) 40 MG tablet Take 40 mg by mouth every evening.     . divalproex (DEPAKOTE ER) 500 MG 24 hr tablet Take 1,000 mg by mouth at bedtime.     . naproxen sodium (ALEVE) 220 MG tablet Take 220-440 mg by mouth 2 (two) times daily as needed (for pain or headaches).     Marland Kitchen RAPAFLO 8 MG CAPS capsule Take 8 mg by mouth at bedtime.       Musculoskeletal: Strength & Muscle Tone: within normal limits Gait & Station: normal Patient leans: N/A            Psychiatric Specialty Exam:  Presentation  General Appearance: Disheveled  Eye Contact:Fair  Speech:Clear and Coherent  Speech Volume:Normal  Handedness:No data recorded  Mood and Affect  Mood:Depressed  Affect:Constricted; Congruent   Thought Process  Thought Processes:Coherent; Goal Directed; Linear  Duration of Psychotic Symptoms: No data recorded Past Diagnosis of Schizophrenia or Psychoactive disorder: No  Descriptions of Associations:Intact  Orientation:Full  (Time, Place and Person)  Thought Content:Logical  Hallucinations:Hallucinations: None  Ideas of Reference:None  Suicidal Thoughts:Suicidal Thoughts: Yes, Passive SI Active Intent and/or Plan: With Intent; With Plan; With Means to Carry Out; With Access to Means SI Passive Intent and/or Plan: Without Intent; Without Plan  Homicidal Thoughts:Homicidal Thoughts: No   Sensorium  Memory:Immediate Fair; Recent Fair; Remote Fair  Judgment:Fair  Insight:Fair   Executive Functions  Concentration:Fair  Attention Span:Fair  [place]  Language:Good   Psychomotor Activity  Psychomotor Activity:Psychomotor Activity: Normal   Assets  Assets:Communication Skills; Desire for Improvement; Resilience; Physical Health   Sleep  Sleep:Sleep: Good Number of Hours of Sleep: 6.75    Physical Exam: Physical Exam  Vitals and nursing note reviewed.  HENT:     Head: Normocephalic and atraumatic.  Pulmonary:     Effort: Pulmonary effort is normal.  Neurological:     General: No focal deficit present.     Mental Status: He is alert and oriented to person, place, and time.    ROS  Constitutional: Negative.   Respiratory: Negative.   Cardiovascular: Negative.   Gastrointestinal: Negative.   Neurological: Negative for dizziness, tremors and seizures.  Psychiatric/Behavioral: Positive for depression, substance abuse and suicidal ideas. Negative for hallucinations. The patient is nervous/anxious.    Blood pressure 105/66, pulse (!) 105, temperature 97.7 F (36.5 C), temperature source Oral, resp. rate 18, height _0  (1.905 m), weight 90.7 kg, SpO2 98 %. Body mass index is 25 kg/m.  Treatment Plan Summary: Daily contact with patient to assess and evaluate symptoms and progress in treatment and Medication management  Observation Level/Precautions:  15 minute checks  Laboratory:  CBC Chemistry Profile HbAIC UDS Lipid panel, TSH.  Available labs  reviewed.  CMP revealed glucose of 123, creatinine of 1.25 and otherwise WNL.  CBC was WNL.  No differential was performed.  Acetaminophen level was <10.  Salicylate level was <6.2.  Influenza A, influenza B and coronavirus test were negative.  Urine drug screen was positive for cocaine and THC.    Pete CMP, lipid panel, hemoglobin A1c and thyroid function studies have been ordered for a.m. draw tomorrow  EKG has been ordered but not yet performed.    Psychotherapy: Encourage participation in group therapy and therapeutic milieu  Medications:  The patient will be restarted on Zoloft 100 mg daily and Abilify 5 mg daily.  We will restart outpatient alprazolam at decreased dose of 0.5 mg every 8 hours for now.  We will continue his outpatient Synthroid 25 mcg daily and Flomax 0.4 mg daily.  See MAR for additional information.  We will also place on CIWA to cover for any benzodiazepine withdrawal in the event that patient is underreporting his Xanax use.   Consultations:    Discharge Concerns:    Estimated LOS: 3 to 5 days  Other: Patient will need referral to residential substance abuse treatment program or other substance abuse treatment program at time of discharge   Physician Treatment Plan for Primary Diagnosis: MDD (major depressive disorder) Long Term Goal(s): Improvement in symptoms so as ready for discharge  Short Term Goals: Ability to identify changes in lifestyle to reduce recurrence of condition will improve, Ability to verbalize feelings will improve, Ability to disclose and discuss suicidal ideas, Ability to demonstrate self-control will improve, Ability to identify and develop effective coping  behaviors will improve, Compliance with prescribed medications will improve and Ability to identify triggers associated with substance abuse/mental health issues will improve  Physician Treatment Plan for Secondary Diagnosis: Principal Problem:   MDD (major depressive disorder) Active Problems:    Moderate cocaine use disorder (HCC)  Long Term Goal(s): Improvement in symptoms so as ready for discharge  Short Term Goals: Ability to identify changes in lifestyle to reduce recurrence of condition will improve, Ability to verbalize feelings will improve, Ability to disclose and discuss suicidal ideas, Ability to demonstrate self-control will improve, Ability to identify and develop effective coping behaviors will improve, Compliance with prescribed medications will improve and Ability to identify triggers associated with substance abuse/mental health issues will improve  I certify that inpatient services furnished can reasonably be expected to improve the patient's condition.    Arthor Captain, MD 5/30/20226:49 PM

## 2021-02-09 NOTE — Progress Notes (Signed)
[facility] Group Notes:  (Nursing/MHT/Case Management/Adjunct)  Date:  02/09/2021  Time:  8:09 PM  Type of Therapy:  Group Therapy  Participation Level:  Did Not Attend  Participation Quality: NA  Affect:  NA Cognitive:  NA  Insight: NA   Engagement in Group: NA  Modes of Intervention:  NA  Summary of Progress/Problems:  Christopher Giles 02/09/2021, 8:09 PM 

## 2021-02-09 NOTE — Progress Notes (Cosign Needed)
Pt did not attend. 

## 2021-02-09 NOTE — Progress Notes (Signed)
   02/08/21 2000  Psych Admission Type (Psych Patients Only)  Admission Status Voluntary  Psychosocial Assessment  Patient Complaints Anxiety  Eye Contact Fair  Facial Expression Anxious  Affect Depressed;Anxious  Speech Logical/coherent  Interaction Assertive  Motor Activity Slow  Appearance/Hygiene In scrubs  Behavior Characteristics Cooperative  Mood Depressed  Thought Process  Coherency WDL  Content WDL  Delusions None reported or observed  Perception WDL  Hallucination None reported or observed  Judgment WDL  Confusion None  Danger to Self  Current suicidal ideation? Denies  Danger to Others  Danger to Others None reported or observed

## 2021-02-09 NOTE — Tx Team (Signed)
Interdisciplinary Treatment and Diagnostic Plan Update  02/09/2021 Time of Session: 9:45am NYLEN CREQUE MRN: [id number]  Principal Diagnosis: <principal problem not specified>  Secondary Diagnoses: Active Problems:   MDD (major depressive disorder)   Current Medications:  Current Facility-Administered Medications  Medication Dose Route Frequency Provider Last Rate Last Admin  . acetaminophen (TYLENOL) tablet 650 mg  650 mg Oral Q6H PRN Derrill Center, NP      . alum & mag hydroxide-simeth (MAALOX/MYLANTA) 200-200-20 MG/5ML suspension 30 mL  30 mL Oral Q4H PRN Derrill Center, NP      . ARIPiprazole (ABILIFY) tablet 5 mg  5 mg Oral Daily Derrill Center, NP   5 mg at 02/09/21 4970  . famotidine (PEPCID) tablet 20 mg  20 mg Oral QHS Derrill Center, NP   20 mg at 02/08/21 1949  . hydrOXYzine (ATARAX/VISTARIL) tablet 25 mg  25 mg Oral TID PRN Rozetta Nunnery, NP      . levothyroxine (SYNTHROID) tablet 25 mcg  25 mcg Oral Q0600 Derrill Center, NP   25 mcg at 02/09/21 2637  . magnesium hydroxide (MILK OF MAGNESIA) suspension 30 mL  30 mL Oral Daily PRN Derrill Center, NP      . nicotine (NICODERM CQ - dosed in mg/24 hours) patch 21 mg  21 mg Transdermal Daily Harlow Asa, MD   21 mg at 02/09/21 0837  . sertraline (ZOLOFT) tablet 100 mg  100 mg Oral Daily Derrill Center, NP   100 mg at 02/09/21 8588  . tamsulosin (FLOMAX) capsule 0.4 mg  0.4 mg Oral Daily Derrill Center, NP   0.4 mg at 02/09/21 5027  . traZODone (DESYREL) tablet 50 mg  50 mg Oral QHS PRN Derrill Center, NP   50 mg at 02/08/21 1950   PTA Medications: Medications Prior to Admission  Medication Sig Dispense Refill Last Dose  . atorvastatin (LIPITOR) 40 MG tablet Take 40 mg by mouth every evening.     . divalproex (DEPAKOTE ER) 500 MG 24 hr tablet Take 1,000 mg by mouth at bedtime.     . naproxen sodium (ALEVE) 220 MG tablet Take 220-440 mg by mouth 2 (two) times daily as needed (for pain or headaches).     Marland Kitchen  RAPAFLO 8 MG CAPS capsule Take 8 mg by mouth at bedtime.       Patient Stressors: Financial difficulties Substance abuse  Patient Strengths: Ability for insight Capable of independent living Curator fund of knowledge Work skills  Treatment Modalities: Medication Management, Group therapy, Case management,  1 to 1 session with clinician, Psychoeducation, Recreational therapy.   Physician Treatment Plan for Primary Diagnosis: <principal problem not specified> Long Term Goal(s):     Short Term Goals:    Medication Management: Evaluate patient's response, side effects, and tolerance of medication regimen.  Therapeutic Interventions: 1 to 1 sessions, Unit Group sessions and Medication administration.  Evaluation of Outcomes: Not Met  Physician Treatment Plan for Secondary Diagnosis: Active Problems:   MDD (major depressive disorder)  Long Term Goal(s):     Short Term Goals:       Medication Management: Evaluate patient's response, side effects, and tolerance of medication regimen.  Therapeutic Interventions: 1 to 1 sessions, Unit Group sessions and Medication administration.  Evaluation of Outcomes: Not Met   RN Treatment Plan for Primary Diagnosis: <principal problem not specified> Long Term Goal(s): Knowledge of disease and therapeutic regimen to maintain health will improve  Short Term Goals: Ability to remain free from injury will improve, Ability to verbalize frustration and anger appropriately will improve, Ability to demonstrate self-control, Ability to identify and develop effective coping behaviors will improve and Compliance with prescribed medications will improve  Medication Management: RN will administer medications as ordered by provider, will assess and evaluate patient's response and provide education to patient for prescribed medication. RN will report any adverse and/or side effects to prescribing provider.  Therapeutic Interventions:  1 on 1 counseling sessions, Psychoeducation, Medication administration, Evaluate responses to treatment, Monitor vital signs and CBGs as ordered, Perform/monitor CIWA, COWS, AIMS and Fall Risk screenings as ordered, Perform wound care treatments as ordered.  Evaluation of Outcomes: Not Met   LCSW Treatment Plan for Primary Diagnosis: <principal problem not specified> Long Term Goal(s): Safe transition to appropriate next level of care at discharge, Engage patient in therapeutic group addressing interpersonal concerns.  Short Term Goals: Engage patient in aftercare planning with referrals and resources, Increase social support, Increase ability to appropriately verbalize feelings, Identify triggers associated with mental health/substance abuse issues and Increase skills for wellness and recovery  Therapeutic Interventions: Assess for all discharge needs, 1 to 1 time with Social worker, Explore available resources and support systems, Assess for adequacy in community support network, Educate family and significant other(s) on suicide prevention, Complete Psychosocial Assessment, Interpersonal group therapy.  Evaluation of Outcomes: Not Met   Progress in Treatment: Attending groups: No. Participating in groups: No. Taking medication as prescribed: Yes. Toleration medication: Yes. Family/Significant other contact made: No, will contact:  if consent is provided Patient understands diagnosis: Yes. Discussing patient identified problems/goals with staff: Yes. Medical problems stabilized or resolved: Yes. Denies suicidal/homicidal ideation: Yes. Issues/concerns per patient self-inventory: No.   New problem(s) identified: No, Describe:  none  New Short Term/Long Term Goal(s): medication stabilization, elimination of SI thoughts, development of comprehensive mental wellness plan.   Patient Goals:  Did not attend  Discharge Plan or Barriers: Patient recently admitted. CSW will continue to  follow and assess for appropriate referrals and possible discharge planning.    Reason for Continuation of Hospitalization: Depression Medication stabilization Suicidal ideation  Estimated Length of Stay: 3-5 days  Attendees: Patient: Did not attend 02/09/2021   Physician:  02/09/2021   Nursing:  02/09/2021   RN Care Manager: 02/09/2021   Social Worker: Darletta Moll, LCSW 02/09/2021   Recreational Therapist:  02/09/2021   Other:  02/09/2021   Other:  02/09/2021  Other: 02/09/2021       Scribe for Treatment Team: Vassie Moselle, [place] 02/09/2021 10:27 AM

## 2021-02-09 NOTE — Progress Notes (Addendum)
   02/09/21 1000  Psych Admission Type (Psych Patients Only)  Admission Status Voluntary  Psychosocial Assessment  Patient Complaints Anxiety  Eye Contact Fair  Facial Expression Anxious  Affect Depressed;Anxious  Speech Logical/coherent  Interaction Assertive  Motor Activity Slow  Appearance/Hygiene In scrubs  Behavior Characteristics Cooperative  Mood Depressed  Thought Process  Coherency WDL  Content WDL  Delusions None reported or observed  Perception WDL  Hallucination None reported or observed  Judgment WDL  Confusion None  Danger to Self  Current suicidal ideation? Denies  Danger to Others  Danger to Others None reported or observed   D. Pt is calm and cooperative, friendly upon approach- when asked if he was having any self harm thoughts, he replied, " I wouldn't do anything, I feel safe here". Pt did not attend morning group, but did go to the cafeteria for lunch. A. Labs and vitals monitored. Pt given and educated on medications. Pt supported emotionally and encouraged to express concerns and ask questions.   R. Pt remains safe with 15 minute checks. Will continue POC.

## 2021-02-09 NOTE — [facility] Counselor (Signed)
CSW attempted to complete PSA but pt was asleep and would not wake.   Miyo Aina, LCSWA Clinicial Social Worker [facility] 

## 2021-02-10 LAB — COMPREHENSIVE METABOLIC PANEL
ALT: 14 U/L (ref 0–44)
AST: 17 U/L (ref 15–41)
Albumin: 4 g/dL (ref 3.5–5.0)
Alkaline Phosphatase: 62 U/L (ref 38–126)
Anion gap: 8 (ref 5–15)
BUN: 13 mg/dL (ref 6–20)
CO2: 28 mmol/L (ref 22–32)
Calcium: 9.4 mg/dL (ref 8.9–10.3)
Chloride: 102 mmol/L (ref 98–111)
Creatinine, Ser: 0.83 mg/dL (ref 0.61–1.24)
GFR, Estimated: >60 mL/min (ref >60)
Glucose, Bld: 112 mg/dL — ABNORMAL HIGH (ref 70–99)
Potassium: 3.8 mmol/L (ref 3.5–5.1)
Sodium: 138 mmol/L (ref 135–145)
Total Bilirubin: 0.4 mg/dL (ref 0.3–1.2)
Total Protein: 6.7 g/dL (ref 6.5–8.1)

## 2021-02-10 LAB — LIPID PANEL
Cholesterol: 163 mg/dL (ref 0–200)
HDL: 34 mg/dL — ABNORMAL LOW (ref >40)
LDL Cholesterol: UNDETERMINED mg/dL (ref 0–99)
Total CHOL/HDL Ratio: 4.8 RATIO
Triglycerides: 409 mg/dL — ABNORMAL HIGH (ref <150)
VLDL: UNDETERMINED mg/dL (ref 0–40)

## 2021-02-10 LAB — T4, FREE: Free T4: 0.8 ng/dL (ref 0.61–1.12)

## 2021-02-10 LAB — TSH: TSH: 4.672 u[IU]/mL — ABNORMAL HIGH (ref 0.350–4.500)

## 2021-02-10 MED ORDER — ALPRAZOLAM 0.5 MG PO TABS
0.5000 mg | ORAL_TABLET | Freq: Three times a day (TID) | ORAL | Status: DC
  Administered 2021-02-10 – 2021-02-13 (×9): 0.5 mg via ORAL
  Filled 2021-02-10 (×8): qty 1

## 2021-02-10 MED ORDER — DOCUSATE SODIUM 100 MG PO CAPS
100.0000 mg | ORAL_CAPSULE | Freq: Every day | ORAL | Status: DC
  Administered 2021-02-10 – 2021-02-15 (×6): 100 mg via ORAL
  Filled 2021-02-10 (×2): qty 1
  Filled 2021-02-10: qty 7
  Filled 2021-02-10 (×5): qty 1
  Filled 2021-02-10: qty 7

## 2021-02-10 MED ORDER — ENSURE ENLIVE PO LIQD
237.0000 mL | Freq: Two times a day (BID) | ORAL | Status: DC
  Administered 2021-02-10 – 2021-02-15 (×7): 237 mL via ORAL
  Filled 2021-02-10 (×14): qty 237

## 2021-02-10 NOTE — Progress Notes (Signed)
   02/09/21 2036  Psych Admission Type (Psych Patients Only)  Admission Status Voluntary  Psychosocial Assessment  Patient Complaints Anxiety  Eye Contact Fair  Facial Expression Anxious  Affect Depressed;Anxious  Speech Logical/coherent  Interaction Assertive  Motor Activity Slow  Appearance/Hygiene In scrubs  Behavior Characteristics Appropriate to situation  Mood Pleasant  Thought Process  Coherency WDL  Content WDL  Delusions None reported or observed  Perception WDL  Hallucination None reported or observed  Judgment WDL  Confusion None  Danger to Self  Current suicidal ideation? Denies  Danger to Others  Danger to Others None reported or observed

## 2021-02-10 NOTE — Progress Notes (Signed)
[facility] MD Progress Note  02/10/2021 3:58 PM Christopher Giles  MRN:  [id number]   Reason for admission: Christopher Giles a 55 year old male a history of depression, anxiety, hypothyroidism and cocaine use disorder who presented to [facility] ED with worsening depressive symptoms and suicidal ideation with a plan to jump off the bridge in the context of multiple psychosocial stressors and relapse on cocaine.   Objective: Medical record reviewed.  Patient's case discussed in detail with members of the treatment team.  I met with and evaluated the patient for follow-up today on the unit.  Patient presents as disheveled and appears depressed with constricted affect.  He reports that he continues to experience sad and anxious mood and intermittent tearful spells.  He has been sleeping and eating okay.  Patient states that he feels hopeless and does not want to be alive.  He denies any active thoughts of harming himself in the hospital but endorses passive wishes not to be alive.  He denies AI, HI, AH, VH or PI.  Patient reports low energy and has been spending most of his time resting or sleeping in his room.  He denies any side effects to his medications.  He is experiencing constipation but denies any other physical problems.  The patient slept 6.5 hours last night.  Labs were not drawn this morning as ordered.  I have reordered them for the evening draw.  Vital signs have been reviewed and are within normal limits other than some mild orthostasis with early morning vital signs today.  The patient is not displaying any signs or reporting any symptoms of benzodiazepine withdrawal.  Patient has not attended any groups since admission.  Principal Problem: MDD (major depressive disorder) Diagnosis: Principal Problem:   MDD (major depressive disorder) Active Problems:   Moderate cocaine use disorder ([facility])  Total Time spent with patient: 20 minutes  Past Psychiatric History: See admission H&P  Past Medical  History:  Past Medical History:  Diagnosis Date  . Anxiety   . Depression   . Moderate cocaine use disorder (Christopher [facility]iles) 02/09/2021  . Substance abuse (Christopher Giles)   . Suicide attempt ([facility]iles)    x 6 per pt  . Thyroid disease    hypo    Past Surgical History:  Procedure Laterality Date  . punctured lung     Family History:  Family History  Problem Relation Age of Onset  . Hyperlipidemia Mother   . Cancer Mother   . Cancer Father   . Diabetes Maternal Grandmother    Family Psychiatric  History: See admission H&P Social History:  Social History   Substance and Sexual Activity  Alcohol Use No     Social History   Substance and Sexual Activity  Drug Use No    Social History   Socioeconomic History  . Marital status: Single    Spouse name: Not on file  . Number of children: Not on file  . Years of education: 75  . Highest education level: High school graduate  Occupational History  . Not on file  Tobacco Use  . Smoking status: Current Every Day Smoker    Packs/day: 1.00    Types: Cigarettes  . Smokeless tobacco: Never Used  Substance and Sexual Activity  . Alcohol use: No  . Drug use: No  . Sexual activity: Never    Birth control/protection: Abstinence  Other Topics Concern  . Not on file  Social History Narrative  . Not on file   Social Determinants  of Health   Financial Resource Strain: Not on file  Food Insecurity: Not on file  Transportation Needs: Not on file  Physical Activity: Not on file  Stress: Not on file  Social Connections: Not on file   Additional Social History:                         Sleep: Good  Appetite:  Fair  Current Medications: Current Facility-Administered Medications  Medication Dose Route Frequency Provider Last Rate Last Admin  . acetaminophen (TYLENOL) tablet 650 mg  650 mg Oral Q6H PRN Derrill Center, NP      . ALPRAZolam Duanne Moron) tablet 0.5 mg  0.5 mg Oral TID Arthor Captain, MD   0.5 mg at 02/10/21 1312  . alum & mag  hydroxide-simeth (MAALOX/MYLANTA) 200-200-20 MG/5ML suspension 30 mL  30 mL Oral Q4H PRN Derrill Center, NP      . ARIPiprazole (ABILIFY) tablet 5 mg  5 mg Oral Daily Derrill Center, NP   5 mg at 02/10/21 0904  . docusate sodium (COLACE) capsule 100 mg  100 mg Oral Daily Arthor Captain, MD   100 mg at 02/10/21 1312  . famotidine (PEPCID) tablet 20 mg  20 mg Oral QHS Derrill Center, NP   20 mg at 02/09/21 2108  . feeding supplement (ENSURE ENLIVE / ENSURE PLUS) liquid 237 mL  237 mL Oral BID BM Akintayo, Mojeed, MD      . hydrOXYzine (ATARAX/VISTARIL) tablet 25 mg  25 mg Oral Q6H PRN Arthor Captain, MD   25 mg at 02/10/21 1330  . levothyroxine (SYNTHROID) tablet 25 mcg  25 mcg Oral Q0600 Derrill Center, NP   25 mcg at 02/10/21 1332  . loperamide (IMODIUM) capsule 2-4 mg  2-4 mg Oral PRN Arthor Captain, MD      . LORazepam (ATIVAN) tablet 1 mg  1 mg Oral Q6H PRN Arthor Captain, MD      . magnesium hydroxide (MILK OF MAGNESIA) suspension 30 mL  30 mL Oral Daily PRN Derrill Center, NP   30 mL at 02/10/21 0908  . multivitamin with minerals tablet 1 tablet  1 tablet Oral Daily Arthor Captain, MD   1 tablet at 02/10/21 4536  . nicotine (NICODERM CQ - dosed in mg/24 hours) patch 21 mg  21 mg Transdermal Daily Harlow Asa, MD   21 mg at 02/10/21 0904  . ondansetron (ZOFRAN-ODT) disintegrating tablet 4 mg  4 mg Oral Q6H PRN Arthor Captain, MD      . sertraline (ZOLOFT) tablet 100 mg  100 mg Oral Daily Derrill Center, NP   100 mg at 02/10/21 0903  . tamsulosin (FLOMAX) capsule 0.4 mg  0.4 mg Oral Daily Derrill Center, NP   0.4 mg at 02/10/21 0904  . thiamine tablet 100 mg  100 mg Oral Daily Arthor Captain, MD   100 mg at 02/10/21 4680  . traZODone (DESYREL) tablet 50 mg  50 mg Oral QHS PRN Derrill Center, NP   50 mg at 02/09/21 2109    Lab Results: No results found for this or any previous visit (from the past 48 hour(s)).  Blood Alcohol level:  Lab Results  Component Value Date    ETH <10 02/07/2021   ETH <5 32/08/2481    Metabolic Disorder Labs: No results found for: HGBA1C, MPG No results found for: PROLACTIN No results found for:  CHOL, TRIG, HDL, CHOLHDL, VLDL, LDLCALC  Physical Findings: AIMS:  , ,  ,  ,    CIWA:  CIWA-Ar Total: 1 COWS:     Musculoskeletal: Strength & Muscle Tone: within normal limits Gait & Station: normal Patient leans: N/A  Psychiatric Specialty Exam:  Presentation  General Appearance: Disheveled  Eye Contact:Fair  Speech:Clear and Coherent; Normal Rate  Speech Volume:Normal  Handedness:No data recorded  Mood and Affect  Mood:Depressed; Anxious; Hopeless  Affect:Constricted   Thought Process  Thought Processes:Coherent; Goal Directed; Linear  Descriptions of Associations:Intact  Orientation:Full (Time, Place and Person)  Thought Content:Logical  History of Schizophrenia/Schizoaffective disorder:No  Duration of Psychotic Symptoms:No data recorded Hallucinations:Hallucinations: None  Ideas of Reference:None  Suicidal Thoughts:Suicidal Thoughts: Yes, Passive SI Passive Intent and/or Plan: Without Intent; Without Plan  Homicidal Thoughts:Homicidal Thoughts: No   Sensorium  Memory:Immediate Fair; Recent Fair; Remote Fair  Judgment:Fair  Insight:Fair   Executive Functions  Concentration:Fair  Attention Span:Fair  [facility]  Language:Good   Psychomotor Activity  Psychomotor Activity:Psychomotor Activity: Normal   Assets  Assets:Communication Skills; Desire for Improvement; Resilience; Physical Health   Sleep  Sleep:Sleep: Good Number of Hours of Sleep: 6.5    Physical Exam: Physical Exam Vitals and nursing note reviewed.  HENT:     Head: Normocephalic and atraumatic.  Pulmonary:     Effort: Pulmonary effort is normal.  Neurological:     General: No focal deficit present.     Mental Status: He is alert and oriented to person, place, and time.     Review of Systems  Constitutional: Positive for malaise/fatigue. Negative for chills, diaphoresis and fever.  Respiratory: Negative.   Cardiovascular: Negative.   Gastrointestinal: Positive for constipation. Negative for diarrhea, nausea and vomiting.  Neurological: Negative.   Psychiatric/Behavioral: Positive for depression and suicidal ideas. Negative for hallucinations. The patient is nervous/anxious. The patient does not have insomnia.    Blood pressure 107/73, pulse 80, temperature 97.7 F (36.5 C), temperature source Oral, resp. rate 18, height '6\' 3"'  (1.905 m), weight 90.7 kg, SpO2 97 %. Body mass index is 25 kg/m.   Treatment Plan Summary: 55 year old male admitted with worsening depressive symptoms and suicidal ideation in the context of increased cocaine use and multiple psychosocial stressors.  Patient continues to present with depressive symptoms and passive suicidal ideation.  Symptoms of low mood, fatigue, hypersomnia are likely due at least in part to withdrawal from cocaine.  Patient continues on CIWA protocol for now in case he has underreported the amount of prescribed alprazolam he was taking prior to admission.  He is not currently displaying any signs or symptoms of benzodiazepine withdrawal.   Daily contact with patient to assess and evaluate symptoms and progress in treatment and Medication management   Continue every 15-minute observation level  Encouraged participation in group therapy and therapeutic milieu  Encouraged increase p.o. intake of fluids and food  Depression -Continue Zoloft 100 mg daily for depression and anxiety -Continue Abilify 5 mg daily for adjunctive treatment of mood symptoms  Anxiety -Continue Zoloft 100 mg daily for depression and anxiety -Continue alprazolam 0.5 mg 3 times daily -Continue hydroxyzine 25 mg every 6 hours PRN  Substance use disorder  -Patient will need referral to residential or outpatient substance abuse treatment  program at discharge for treatment of cocaine use -Continue CIWA protocol with PRN lorazepam to cover for any possible withdrawal symptoms that could emerge in the event patient has underreported his benzodiazepine use outside the  hospital  Insomnia -Continue trazodone 50 mg at bedtime PRN  Constipation -Start Colace 100 mg daily -Continue milk of magnesia 30 mL daily PRN  BPH -Continue tamsulosin 0.4 mg daily  GERD -Continue famotidine 20 mg daily  Hypothyroidism -Continue levothyroxine 25 mcg daily -TSH and free T4 have been ordered but not yet drawn  Disposition planning in progress   Arthor Captain, MD 02/10/2021, 3:58 PM

## 2021-02-10 NOTE — BHH Counselor (Signed)
CSW gave pt a packet of information on utility assistance in [place].   Fredirick Lathe, LCSWA Clinicial Social Worker [employer]
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
## 2021-02-10 NOTE — Progress Notes (Signed)
Psychoeducational Group Note  Date:  02/10/2021 Time: 2000 Group Topic/Focus:  wrap up group  Participation Level: Did Not Attend  Participation Quality:  Not Applicable  Affect:  Not Applicable  Cognitive:  Not Applicable  Insight:  Not Applicable  Engagement in Group: Not Applicable  Additional Comments:  Did not attend.   Rossetta Kama S 02/10/2021, 9:00 PM  

## 2021-02-10 NOTE — Progress Notes (Signed)
NUTRITION ASSESSMENT  Pt identified as at risk on the Malnutrition Screen Tool  INTERVENTION: 1. Supplements: Ensure Enlive po BID, each supplement provides 350 kcal and 20 grams of protein   NUTRITION DIAGNOSIS: Unintentional weight loss related to sub-optimal intake as evidenced by pt report.   Goal: Pt to meet >/= 90% of their estimated nutrition needs.  Monitor:  PO intake  Assessment:  Pt admitted for depression and substance abuse (cocaine). Pt reports decreased appetite. Per weight records, pt has lost 24 lbs since 3/1 (10% wt loss x 3 months, significant for time frame). Will order Ensure supplements given weight loss.   Height: Ht Readings from Last 1 Encounters:  02/08/21 6\' 3"  (1.905 m)    Weight: Wt Readings from Last 1 Encounters:  02/08/21 90.7 kg    Weight Hx: Wt Readings from Last 10 Encounters:  02/08/21 90.7 kg  11/11/20 102.1 kg  01/02/19 110.7 kg  07/16/16 97.5 kg  07/15/16 88.9 kg  07/09/16 88.9 kg  06/14/16 88.9 kg  06/19/16 88.9 kg  06/12/16 89.8 kg  05/31/16 89.8 kg    BMI:  Body mass index is 25 kg/m. Pt meets criteria for normal based on current BMI.  Estimated Nutritional Needs: Kcal: 25-30 kcal/kg Protein: > 1 gram protein/kg Fluid: 1 ml/kcal  Diet Order:  Diet Order    None     Pt is also offered choice of unit snacks mid-morning and mid-afternoon.  Pt is eating as desired.   Lab results and medications reviewed.   06/02/16, MS, RD, LDN Inpatient Clinical Dietitian Contact information available via Amion

## 2021-02-10 NOTE — Progress Notes (Signed)
   D:  Patient presents with high anxiety and depression rated at a level of 10/10.  Patient denies pain.  Patient did not attend groups today.  Patient assessment was completed at bedside.  Patient denies SI/HI and verbally contacts to see a member of staff in the event of a change.  Patient denies AVH, however reports tactile hallucinations in which he described as, "skin crawling."  A:  Labs/Vitals monitored; Medication education provided; Patient supported emotionally; Patient asked to communicate his needs concerns, and questions.  R:  Patient remains safe with 15 minute checks; Will continue POC.      02/10/21 2200  Psych Admission Type (Psych Patients Only)  Admission Status Voluntary  Psychosocial Assessment  Patient Complaints Anxiety;Depression  Eye Contact Fair  Facial Expression Anxious  Affect Depressed;Anxious  Speech Logical/coherent  Interaction Assertive  Motor Activity Slow  Appearance/Hygiene In scrubs  Behavior Characteristics Cooperative  Mood Depressed;Anxious  Thought Process  Coherency WDL  Content WDL  Delusions None reported or observed  Perception Hallucinations  Hallucination Tactile (Per pt "skin crawling")  Judgment WDL  Confusion None  Danger to Self  Current suicidal ideation? Denies  Danger to Others  Danger to Others None reported or observed

## 2021-02-10 NOTE — Progress Notes (Cosign Needed)
Pt did not attend goals group. 

## 2021-02-10 NOTE — Progress Notes (Signed)
Pt did not attend group. 

## 2021-02-10 NOTE — Progress Notes (Addendum)
Pt visible in bed on initial contact. Presents with flat affect, depressed mood and fidgety on interactions Rates his anxiety 9/10 and depression 8/10 when assessed. Denies SI, HI, AVH and pain. Per pt "I'm always anxious but I'm don't have any withdrawal symptoms so far". Pt received PRN Vistaril 25 mg PO at 1330 as ordered for anxiety. Rated his anxiety level 3/10 when assessed when reassessed. Pt did not attend groups as scheduled but was seen in dayroom briefly with peers watching TV at intervals. Pt did not attend scheduled groups despite multiple prompts. CIWA at noon 5  "I'm just anxious that's all".  Support and encouragement offered to pt this shift. All medications given as ordered. Safety checks maintained without self harm gestures or outburst.  Pt tolerates all medications and meals well without discomfort. Remains safe on and off unit.

## 2021-02-10 NOTE — Progress Notes (Signed)
Recreation Therapy Notes  Animal-Assisted Activity (AAA) Program Checklist/Progress Notes Patient Eligibility Criteria Checklist & Daily Group note for Rec Tx Intervention  Date: 5.31.22 Time: 1430 Location: [address]   AAA/T Program Assumption of Risk Form signed by Engineer, production or Parent Legal Guardian YES   Patient is free of allergies or severe asthma  YES  Patient reports no fear of animals  YES  Patient reports no history of cruelty to animals YES   Patient understands his/her participation is voluntary YES  Patient washes hands before animal contact  YES   Patient washes hands after animal contact  YES  Education: Charity fundraiser, Appropriate Animal Interaction   Education Outcome: Acknowledges understanding/In group clarification offered/Needs additional education.   Clinical Observations/Feedback:  Pt did not attend activity.    Caroll Rancher, LRT/CTRS         Caroll Rancher A 02/10/2021 3:53 PM

## 2021-02-11 LAB — LDL CHOLESTEROL, DIRECT: Direct LDL: 98.5 mg/dL (ref 0–99)

## 2021-02-11 MED ORDER — SERTRALINE HCL 50 MG PO TABS
150.0000 mg | ORAL_TABLET | Freq: Every day | ORAL | Status: DC
  Administered 2021-02-12 – 2021-02-15 (×4): 150 mg via ORAL
  Filled 2021-02-11 (×3): qty 3
  Filled 2021-02-11 (×2): qty 21
  Filled 2021-02-11: qty 3

## 2021-02-11 MED ORDER — POLYETHYLENE GLYCOL 3350 17 G PO PACK
17.0000 g | PACK | Freq: Every day | ORAL | Status: DC
  Administered 2021-02-11 – 2021-02-15 (×5): 17 g via ORAL
  Filled 2021-02-11: qty 1
  Filled 2021-02-11: qty 7
  Filled 2021-02-11 (×3): qty 1
  Filled 2021-02-11: qty 7
  Filled 2021-02-11 (×2): qty 1

## 2021-02-11 NOTE — BHH Counselor (Signed)
CSW discussed utility assistance resources with pt. CSW explained that pt will have to follow up with these post discharge due to most of them being walk-in or appointment required. CSW explained that the facilities stated that CSW cannot do this on his behalf. Pt did not like this answer but stated he understood.   Fredirick Lathe, LCSWA Clinicial Social Worker [employer]

## 2021-02-11 NOTE — Progress Notes (Signed)
[facility] MD Progress Note  02/11/2021 1:24 PM Christopher Giles  MRN:  [id number]   Reason for admission: Christopher Giles a 55 year old male a history of depression, anxiety, hypothyroidism and cocaine use disorder who presented to [name] ED with worsening depressive symptoms and suicidal ideation with a plan to jump off the bridge in the context of multiple psychosocial stressors and relapse on cocaine.   Objective: Medical record reviewed.  Patient's case discussed in detail with members of the treatment team.  I met with and evaluated the patient for follow-up today on the unit.  Patient continues to present as disheveled and depressed with constricted affect and monotone speech.  He maintains fair eye contact and is appropriately engaged in our conversation.  He reports that his mood remains sad and hopeless.  He endorses passive wishes not to be alive but denies any active thoughts of harming himself in the hospital.  He denies AI or HI or any psychotic symptoms.  The patient reports difficulty with middle insomnia last night but acknowledges that he napped much of the day on and off yesterday.  His appetite is okay.  He inquires about adding Adderall to improve his energy and states that he has taken it in the past. I explained to him that much of his current fatigue is likely due to his recent cocaine use and to his ongoing depression and should improve with time and continued treatment.  The patient denies any side effects to his medications or any physical problems other than constipation.   He slept 6.5 hours last night.  Vital signs have been stable and within normal limits.  Labs drawn last evening include CMP with glucose of 112 and otherwise WNL.  Lipid panel with HDL of 34, triglycerides of 409 and otherwise WNL.  TSH was 4.672 and free T4 was 0.80.  Chart notes document that patient did not attend groups yesterday and has not attended groups thus far today.  Principal Problem: MDD (major  depressive disorder) Diagnosis: Principal Problem:   MDD (major depressive disorder) Active Problems:   Moderate cocaine use disorder (HCC)  Total Time spent with patient: 20 minutes  Past Psychiatric History: See admission H&P  Past Medical History:  Past Medical History:  Diagnosis Date  . Anxiety   . Depression   . Moderate cocaine use disorder ([place]) 02/09/2021  . Substance abuse ([place])   . Suicide attempt ([place])    x 6 per pt  . Thyroid disease    hypo    Past Surgical History:  Procedure Laterality Date  . punctured lung     Family History:  Family History  Problem Relation Age of Onset  . Hyperlipidemia Mother   . Cancer Mother   . Cancer Father   . Diabetes Maternal Grandmother    Family Psychiatric  History: See admission H&P Social History:  Social History   Substance and Sexual Activity  Alcohol Use No     Social History   Substance and Sexual Activity  Drug Use No    Social History   Socioeconomic History  . Marital status: Single    Spouse name: Not on file  . Number of children: Not on file  . Years of education: 39  . Highest education level: High school graduate  Occupational History  . Not on file  Tobacco Use  . Smoking status: Current Every Day Smoker    Packs/day: 1.00    Types: Cigarettes  . Smokeless tobacco: Never Used  Substance and Sexual Activity  . Alcohol use: No  . Drug use: No  . Sexual activity: Never    Birth control/protection: Abstinence  Other Topics Concern  . Not on file  Social History Narrative  . Not on file   Social Determinants of Health   Financial Resource Strain: Not on file  Food Insecurity: Not on file  Transportation Needs: Not on file  Physical Activity: Not on file  Stress: Not on file  Social Connections: Not on file   Additional Social History:                         Sleep: Good  Appetite:  Fair  Current Medications: Current Facility-Administered Medications  Medication  Dose Route Frequency Provider Last Rate Last Admin  . acetaminophen (TYLENOL) tablet 650 mg  650 mg Oral Q6H PRN Derrill Center, NP      . ALPRAZolam Duanne Moron) tablet 0.5 mg  0.5 mg Oral TID Arthor Captain, MD   0.5 mg at 02/11/21 0936  . alum & mag hydroxide-simeth (MAALOX/MYLANTA) 200-200-20 MG/5ML suspension 30 mL  30 mL Oral Q4H PRN Derrill Center, NP      . ARIPiprazole (ABILIFY) tablet 5 mg  5 mg Oral Daily Derrill Center, NP   5 mg at 02/11/21 0827  . docusate sodium (COLACE) capsule 100 mg  100 mg Oral Daily Arthor Captain, MD   100 mg at 02/11/21 0827  . famotidine (PEPCID) tablet 20 mg  20 mg Oral QHS Derrill Center, NP   20 mg at 02/10/21 2134  . feeding supplement (ENSURE ENLIVE / ENSURE PLUS) liquid 237 mL  237 mL Oral BID BM Akintayo, Mojeed, MD   237 mL at 02/11/21 0940  . hydrOXYzine (ATARAX/VISTARIL) tablet 25 mg  25 mg Oral Q6H PRN Arthor Captain, MD   25 mg at 02/10/21 1330  . levothyroxine (SYNTHROID) tablet 25 mcg  25 mcg Oral Q0600 Derrill Center, NP   25 mcg at 02/11/21 0640  . loperamide (IMODIUM) capsule 2-4 mg  2-4 mg Oral PRN Arthor Captain, MD      . LORazepam (ATIVAN) tablet 1 mg  1 mg Oral Q6H PRN Arthor Captain, MD      . magnesium hydroxide (MILK OF MAGNESIA) suspension 30 mL  30 mL Oral Daily PRN Derrill Center, NP   30 mL at 02/11/21 1307  . multivitamin with minerals tablet 1 tablet  1 tablet Oral Daily Arthor Captain, MD   1 tablet at 02/11/21 [phone]  . nicotine (NICODERM CQ - dosed in mg/24 hours) patch 21 mg  21 mg Transdermal Daily Harlow Asa, MD   21 mg at 02/11/21 0826  . ondansetron (ZOFRAN-ODT) disintegrating tablet 4 mg  4 mg Oral Q6H PRN Arthor Captain, MD      . sertraline (ZOLOFT) tablet 100 mg  100 mg Oral Daily Derrill Center, NP   100 mg at 02/11/21 0827  . tamsulosin (FLOMAX) capsule 0.4 mg  0.4 mg Oral Daily Derrill Center, NP   0.4 mg at 02/11/21 0827  . thiamine tablet 100 mg  100 mg Oral Daily Arthor Captain, MD   100 mg at  02/11/21 0827  . traZODone (DESYREL) tablet 50 mg  50 mg Oral QHS PRN Derrill Center, NP   50 mg at 02/10/21 2134    Lab Results:  Results for orders placed or  performed during the hospital encounter of 02/08/21 (from the past 48 hour(s))  Comprehensive metabolic panel     Status: Abnormal   Collection Time: 02/10/21  6:35 PM  Result Value Ref Range   Sodium 138 135 - 145 mmol/L   Potassium 3.8 3.5 - 5.1 mmol/L   Chloride 102 98 - 111 mmol/L   CO2 28 22 - 32 mmol/L   Glucose, Bld 112 (H) 70 - 99 mg/dL    Comment: Glucose reference range applies only to samples taken after fasting for at least 8 hours.   BUN 13 6 - 20 mg/dL   Creatinine, Ser 0.83 0.61 - 1.24 mg/dL   Calcium 9.4 8.9 - 10.3 mg/dL   Total Protein 6.7 6.5 - 8.1 g/dL   Albumin 4.0 3.5 - 5.0 g/dL   AST 17 15 - 41 U/L   ALT 14 0 - 44 U/L   Alkaline Phosphatase 62 38 - 126 U/L   Total Bilirubin 0.4 0.3 - 1.2 mg/dL   GFR, Estimated >60 >60 mL/min    Comment: (NOTE) Calculated using the CKD-EPI Creatinine Equation (2021)    Anion gap 8 5 - 15    Comment: Performed at [name], [place] [address] [place], [place] [address]  Lipid panel     Status: Abnormal   Collection Time: 02/10/21  6:35 PM  Result Value Ref Range   Cholesterol 163 0 - 200 mg/dL   Triglycerides 409 (H) <150 mg/dL   HDL 34 (L) >40 mg/dL   Total CHOL/HDL Ratio 4.8 RATIO   VLDL UNABLE TO CALCULATE IF TRIGLYCERIDE OVER 400 mg/dL 0 - 40 mg/dL   LDL Cholesterol UNABLE TO CALCULATE IF TRIGLYCERIDE OVER 400 mg/dL 0 - 99 mg/dL    Comment:        Total Cholesterol/HDL:CHD Risk Coronary Heart Disease Risk Table                     Men   Women  1/2 Average Risk   3.4   3.3  Average Risk       5.0   4.4  2 X Average Risk   9.6   7.1  3 X Average Risk  23.4   11.0        Use the calculated Patient Ratio above and the CHD Risk Table to determine the patient's CHD Risk.        ATP III CLASSIFICATION (LDL):  <100     mg/dL   Optimal   100-129  mg/dL   Near or Above                    Optimal  130-159  mg/dL   Borderline  160-189  mg/dL   High  >190     mg/dL   Very High Performed at [facility] [address] [place], [place] [address]   T4, free     Status: None   Collection Time: 02/10/21  6:35 PM  Result Value Ref Range   Free T4 0.80 0.61 - 1.12 ng/dL    Comment: (NOTE) Biotin ingestion may interfere with free T4 tests. If the results are inconsistent with the TSH level, previous test results, or the clinical presentation, then consider biotin interference. If needed, order repeat testing after stopping biotin. Performed at [facility], [place] [address] [place], [place] [address]   TSH     Status: Abnormal   Collection Time: 02/10/21  6:35 PM  Result Value Ref Range   TSH 4.672 (H) 0.350 - 4.500 uIU/mL    Comment: Performed by a 3rd Generation assay with a functional sensitivity of <=0.01 uIU/mL. Performed at [facility], [place] [address], [place], [place] [address]   LDL cholesterol, direct     Status: None   Collection Time: 02/10/21  6:35 PM  Result Value Ref Range   Direct LDL 98.5 0 - 99 mg/dL    Comment: Performed at [facility] [address] [place],  [address]    Blood Alcohol level:  Lab Results  Component Value Date   ETH <10 02/07/2021   ETH <5 56/43/3295    Metabolic Disorder Labs: No results found for: HGBA1C, MPG No results found for: PROLACTIN Lab Results  Component Value Date   CHOL 163 02/10/2021   TRIG 409 (H) 02/10/2021   HDL 34 (L) 02/10/2021   CHOLHDL 4.8 02/10/2021   VLDL UNABLE TO CALCULATE IF TRIGLYCERIDE OVER 400 mg/dL 02/10/2021   LDLCALC UNABLE TO CALCULATE IF TRIGLYCERIDE OVER 400 mg/dL 02/10/2021    Physical Findings: AIMS:  , ,  ,  ,    CIWA:  CIWA-Ar Total: 7 COWS:     Musculoskeletal: Strength & Muscle Tone: within normal limits Gait & Station: normal Patient leans: N/A  Psychiatric  Specialty Exam:  Presentation  General Appearance: Disheveled  Eye Contact:Fair  Speech:Clear and Coherent; Normal Rate  Speech Volume:Normal  Handedness:No data recorded  Mood and Affect  Mood:Depressed; Hopeless  Affect:Congruent   Thought Process  Thought Processes:Coherent; Goal Directed; Linear  Descriptions of Associations:Intact  Orientation:Full (Time, Place and Person)  Thought Content:Logical  History of Schizophrenia/Schizoaffective disorder:No  Duration of Psychotic Symptoms:No data recorded Hallucinations:Hallucinations: None  Ideas of Reference:None  Suicidal Thoughts:Suicidal Thoughts: Yes, Passive SI Passive Intent and/or Plan: Without Intent; Without Plan  Homicidal Thoughts:Homicidal Thoughts: No   Sensorium  Memory:Immediate Good; Recent Good; Remote Good  Judgment:Fair  Insight:Fair   Executive Functions  Concentration:Fair  Attention Span:Fair  [place]  Language:Good   Psychomotor Activity  Psychomotor Activity:Psychomotor Activity: Normal   Assets  Assets:Communication Skills; Desire for Improvement; Resilience   Sleep  Sleep:Sleep: Good Number of Hours of Sleep: 6.5    Physical Exam: Physical Exam Vitals and nursing note reviewed.  HENT:     Head: Normocephalic and atraumatic.  Pulmonary:     Effort: Pulmonary effort is normal.  Neurological:     General: No focal deficit present.     Mental Status: He is alert and oriented to person, place, and time.    Review of Systems  Constitutional: Positive for malaise/fatigue. Negative for chills, diaphoresis and fever.  Respiratory: Negative.   Cardiovascular: Negative.   Gastrointestinal: Positive for constipation. Negative for diarrhea, nausea and vomiting.  Neurological: Negative.   Psychiatric/Behavioral: Positive for depression and suicidal ideas. Negative for hallucinations. The patient is nervous/anxious. The patient does not  have insomnia.    Blood pressure 102/73, pulse 76, temperature 97.8 F (36.6 C), temperature source Oral, resp. rate 18, height '6\' 3"'  (1.905 m), weight 90.7 kg, SpO2 98 %. Body mass index is 25 kg/m.   Treatment Plan Summary: 55 year old male admitted with worsening depressive symptoms and suicidal ideation in the context of increased cocaine use and multiple psychosocial stressors.  Patient continues to present with depressive symptoms and passive suicidal ideation.  Symptoms of low mood, fatigue, hypersomnia are likely due at least in part to withdrawal  from cocaine.  Patient continues on CIWA protocol for now in case he has underreported the amount of prescribed alprazolam he was taking prior to admission.  He is not currently displaying any signs or symptoms of benzodiazepine withdrawal.   Daily contact with patient to assess and evaluate symptoms and progress in treatment and Medication management   Continue every 15-minute observation level  Encouraged participation in group therapy and therapeutic milieu  Encouraged increase p.o. intake of fluids and food  Depression -Increase Zoloft to 150 mg daily for depression and anxiety -Continue Abilify 5 mg daily for adjunctive treatment of mood symptoms  Anxiety -Increase Zoloft to 150 mg daily for depression and anxiety -Continue alprazolam 0.5 mg 3 times daily -Continue hydroxyzine 25 mg every 6 hours PRN  Substance use disorder  -Patient will need referral to residential or outpatient substance abuse treatment program at discharge for treatment of cocaine use -Continue CIWA protocol with PRN lorazepam to cover for any possible withdrawal symptoms that could emerge in the event patient has underreported his benzodiazepine use outside the hospital  Insomnia -Continue trazodone 50 mg at bedtime PRN  Constipation -Start MiraLAX 17 g daily -Continue Colace 100 mg daily -Continue milk of magnesia 30 mL daily PRN  BPH -Continue  tamsulosin 0.4 mg daily  GERD -Continue famotidine 20 mg daily  Hypothyroidism -Continue levothyroxine 25 mcg daily -TSH slightly elevated and free T4 WNL  Disposition planning in progress   Arthor Captain, MD 02/11/2021, 1:24 PM

## 2021-02-11 NOTE — Plan of Care (Signed)
Cooperative and pleasant on approach. Continues to express passive SI, reporting that he has multiple problems that he is unable to solve, leading to hopelessness. Mostly in room but out for medications and meals.

## 2021-02-11 NOTE — [facility] Group Notes (Signed)
The focus of this group is to help patients establish daily goals to achieve during treatment and discuss how the patient can incorporate goal setting into their daily lives to aide in recovery.  Pt did not attend group 

## 2021-02-11 NOTE — Progress Notes (Signed)
Recreation Therapy Notes  Date: 6.1.22 Time: 0930 Location: [address] Dayroom  Group Topic: Stress Management   Goal Area(s) Addresses:  Patient will actively participate in stress management techniques presented during session.  Patient will successfully identify benefit of practicing stress management post d/c.   Intervention: Guided exercise with ambient sound and script  Activity :Guided Imagery  LRT read a beach visualization script to patients.  Patients were asked to participate in the technique introduced during introduction. Patients were given suggestions of ways to access scripts post d/c and encouraged to explore Youtube and other apps available on smartphones, tablets, and computers.   Education:  Stress Management, Discharge Planning.   Education Outcome: Acknowledges education  Clinical Observations/Feedback: Patient did not attend group session.   Christopher Giles, LRT/CTRS         [name] A 02/11/2021 12:38 PM 

## 2021-02-11 NOTE — [facility] Group Notes (Signed)
Topic: Emotions  Due to the acuity and complex discharge plans, group was not held. Patient was provided therapeutic worksheets and asked to meet with CSW as needed.  Wandra Babin, LCSWA Clinicial Social Worker [facility] 

## 2021-02-12 DIAGNOSIS — F332 Major depressive disorder, recurrent severe without psychotic features: Secondary | ICD-10-CM

## 2021-02-12 LAB — HEMOGLOBIN A1C
Hgb A1c MFr Bld: 5.6 % (ref 4.8–5.6)
Mean Plasma Glucose: 114 mg/dL

## 2021-02-12 MED ORDER — HYDROXYZINE HCL 25 MG PO TABS
25.0000 mg | ORAL_TABLET | Freq: Two times a day (BID) | ORAL | Status: DC | PRN
  Administered 2021-02-13: 25 mg via ORAL
  Filled 2021-02-12: qty 1
  Filled 2021-02-12 (×2): qty 10
  Filled 2021-02-12 (×2): qty 1

## 2021-02-12 NOTE — Progress Notes (Signed)
Did not attend goals group 

## 2021-02-12 NOTE — Progress Notes (Signed)
   02/12/21 1100  Psych Admission Type (Psych Patients Only)  Admission Status Voluntary  Psychosocial Assessment  Patient Complaints Depression  Eye Contact Fair  Facial Expression Anxious  Affect Anxious;Depressed  Speech Logical/coherent  Interaction Assertive  Motor Activity Slow  Appearance/Hygiene In scrubs  Behavior Characteristics Cooperative  Mood Depressed  Thought Process  Coherency WDL  Content WDL  Delusions WDL;None reported or observed  Perception WDL  Hallucination None reported or observed  Judgment Poor  Confusion None  Danger to Self  Current suicidal ideation? Denies  Danger to Others  Danger to Others None reported or observed

## 2021-02-12 NOTE — Progress Notes (Signed)
[facility] Group Notes:  (Nursing/MHT/Case Management/Adjunct)  Date:  02/12/2021  Time:  12:06 AM  Type of Therapy:Did not attend group   Participation Level:  Did Not Attend  Participation Quality:  DNA  Affect:  DNA  Cognitive:  DNA  Insight:  None  Engagement in Group:  DNA  Modes of Intervention:  DNA  Summary of Progress/Problems: Did not attend group   Christopher Giles, Christopher Giles 02/12/2021, 12:06 AM
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
## 2021-02-12 NOTE — Progress Notes (Signed)
   02/12/21 0000  CIWA-Ar  BP 102/73  Pulse Rate 98  Nausea and Vomiting 0  Tactile Disturbances 0  Tremor 0  Auditory Disturbances 0  Paroxysmal Sweats 0  Visual Disturbances 0  Anxiety 2  Headache, Fullness in Head 0  Agitation 0  Orientation and Clouding of Sensorium 0  CIWA-Ar Total 2   Patient compliant with medications. C/O insomnia Trazodone 50 mg PRN given at 2117 and reported not effective. Patient requested anxiety medication at 2256, PRN vistaril 25 mg administered and effective by 2340.   Support and encouragement provided as needed. Denies SI/HI/A/VH and verbally contracted for safety. Q15 minutes safety checks ongoing without self harm gestures.

## 2021-02-12 NOTE — Progress Notes (Cosign Needed)
Pt did not attend nutrition group. 

## 2021-02-13 MED ORDER — ALPRAZOLAM 0.5 MG PO TABS
0.5000 mg | ORAL_TABLET | Freq: Two times a day (BID) | ORAL | Status: DC
  Administered 2021-02-13 – 2021-02-15 (×4): 0.5 mg via ORAL
  Filled 2021-02-13 (×3): qty 2
  Filled 2021-02-13: qty 1

## 2021-02-13 MED ORDER — OXCARBAZEPINE 150 MG PO TABS
150.0000 mg | ORAL_TABLET | Freq: Two times a day (BID) | ORAL | Status: DC
  Administered 2021-02-13 – 2021-02-15 (×4): 150 mg via ORAL
  Filled 2021-02-13 (×3): qty 14
  Filled 2021-02-13: qty 1
  Filled 2021-02-13: qty 14
  Filled 2021-02-13 (×4): qty 1

## 2021-02-13 NOTE — Progress Notes (Signed)
Pt did not attend group. 

## 2021-02-13 NOTE — [facility] Group Notes (Signed)
  [facility] LCSW Group Therapy Note   Type of Therapy and Topic:  Group Therapy:  Feelings About Hospitalization  Participation Level:  Did Not Attend   Description of Group This process group involved patients discussing their feelings related to being hospitalized, as well as the benefits they see to being in the hospital.  These feelings and benefits were itemized.  The group then brainstormed specific ways in which they could seek those same benefits when they discharge and return home.  Therapeutic Goals 1. Patient will identify and describe positive and negative feelings related to hospitalization 2. Patient will verbalize benefits of hospitalization to themselves personally 3. Patients will brainstorm together ways they can obtain similar benefits in the outpatient setting, identify barriers to wellness and possible solutions  Summary of Patient Progress:  Did Not Attend  Therapeutic Modalities Cognitive Behavioral Therapy Motivational Interviewing

## 2021-02-13 NOTE — Progress Notes (Signed)
Recreation Therapy Notes  Date:  6.3.22 Time: 0930 Location: [address] Dayroom  Group Topic: Stress Management  Goal Area(s) Addresses:  Patient will identify positive stress management techniques. Patient will identify benefits of using stress management post d/c.  Intervention: Stress Management   Activity: Meditation.  LRT played a meditation that focused on looking at each day as a new opportunity to start your day on a good note.    Education:  Stress Management, Discharge Planning.   Education Outcome: Acknowledges Education  Clinical Observations/Feedback: Pt did not attend group session.    Caroll Rancher, LRT/CTRS         Caroll Rancher A 02/13/2021 12:31 PM

## 2021-02-13 NOTE — Progress Notes (Signed)
[facility] MD Progress Note  02/13/2021 5:50 PM Christopher Giles  MRN:  [id number]   Reason for admission: Christopher Giles Harrisis a 55 year old male a history of depression, anxiety, hypothyroidism and cocaine use disorder who presented to [name] ED with worsening depressive symptoms and suicidal ideation with a plan to jump off the bridge in the context of multiple psychosocial stressors and relapse on cocaine.   Objective: Medical record reviewed.  Patient's case discussed in detail with members of the treatment team.  I met with and evaluated the patient for follow-up today on the unit.  Patient was in bed but willing to go to the office on the unit to meet with me.  He continues to present as disheveled with constricted affect.  Patient states that he feels sad and hopeless and that he heard he was going to be discharged soon which upset him.  He states he feels he is not ready to be discharged and has thoughts of stabbing himself in the trachea or taking pills if he is sent home.  He denies suicidal ideation in the hospital.  He denies AI or HI.  He states that he is not interested in going to a substance use disorder treatment program after discharge.  He denies any side effects to his medication or any physical problems.  He reports that his sleep is not good however he is spending most of the day in bed dozing on and off.  I advised him that he needs to stay awake during the day and participate in groups which is part of his treatment.  Patient stated understanding.  We discussed starting Trileptal and decreasing alprazolam to 0.5 mg twice daily and continuing other medications at current doses.  Patient is in agreement with this plan.  I discussed with him that he will likely be discharged soon and encouraged him to reconsider going to a substance abuse treatment program after discharge.  Hours of sleep were not recorded for last night.  Per chart notes, patient has not been attending groups and has been  spending most of his time in his room asleep.  He does get up for meals and appetite has been good.  Principal Problem: MDD (major depressive disorder) Diagnosis: Principal Problem:   MDD (major depressive disorder) Active Problems:   Moderate cocaine use disorder (HCC)  Total Time spent with patient: 15 minutes  Past Psychiatric History: See admission H&P  Past Medical History:  Past Medical History:  Diagnosis Date  . Anxiety   . Depression   . Moderate cocaine use disorder ([place]) 02/09/2021  . Substance abuse ([place])   . Suicide attempt ([place])    x 6 per pt  . Thyroid disease    hypo    Past Surgical History:  Procedure Laterality Date  . punctured lung     Family History:  Family History  Problem Relation Age of Onset  . Hyperlipidemia Mother   . Cancer Mother   . Cancer Father   . Diabetes Maternal Grandmother    Family Psychiatric  History: See admission H&P Social History:  Social History   Substance and Sexual Activity  Alcohol Use No     Social History   Substance and Sexual Activity  Drug Use No    Social History   Socioeconomic History  . Marital status: Single    Spouse name: Not on file  . Number of children: Not on file  . Years of education: 19  . Highest education level:  High school graduate  Occupational History  . Not on file  Tobacco Use  . Smoking status: Current Every Day Smoker    Packs/day: 1.00    Types: Cigarettes  . Smokeless tobacco: Never Used  Substance and Sexual Activity  . Alcohol use: No  . Drug use: No  . Sexual activity: Never    Birth control/protection: Abstinence  Other Topics Concern  . Not on file  Social History Narrative  . Not on file   Social Determinants of Health   Financial Resource Strain: Not on file  Food Insecurity: Not on file  Transportation Needs: Not on file  Physical Activity: Not on file  Stress: Not on file  Social Connections: Not on file   Additional Social History:                          Sleep: Good  Appetite:  Fair  Current Medications: Current Facility-Administered Medications  Medication Dose Route Frequency Provider Last Rate Last Admin  . acetaminophen (TYLENOL) tablet 650 mg  650 mg Oral Q6H PRN Derrill Center, NP   650 mg at 02/12/21 1811  . [START ON 02/14/2021] ALPRAZolam Duanne Moron) tablet 0.5 mg  0.5 mg Oral BID Arthor Captain, MD   0.5 mg at 02/13/21 1400  . alum & mag hydroxide-simeth (MAALOX/MYLANTA) 200-200-20 MG/5ML suspension 30 mL  30 mL Oral Q4H PRN Derrill Center, NP      . ARIPiprazole (ABILIFY) tablet 5 mg  5 mg Oral Daily Derrill Center, NP   5 mg at 02/13/21 0905  . docusate sodium (COLACE) capsule 100 mg  100 mg Oral Daily Arthor Captain, MD   100 mg at 02/13/21 3664  . famotidine (PEPCID) tablet 20 mg  20 mg Oral QHS Derrill Center, NP   20 mg at 02/12/21 2125  . feeding supplement (ENSURE ENLIVE / ENSURE PLUS) liquid 237 mL  237 mL Oral BID BM Akintayo, Mojeed, MD   237 mL at 02/13/21 1707  . hydrOXYzine (ATARAX/VISTARIL) tablet 25 mg  25 mg Oral BID PRN White, Patrice L, NP      . levothyroxine (SYNTHROID) tablet 25 mcg  25 mcg Oral Q0600 Derrill Center, NP   25 mcg at 02/13/21 0629  . magnesium hydroxide (MILK OF MAGNESIA) suspension 30 mL  30 mL Oral Daily PRN Derrill Center, NP   30 mL at 02/11/21 1307  . multivitamin with minerals tablet 1 tablet  1 tablet Oral Daily Arthor Captain, MD   1 tablet at 02/13/21 4034  . nicotine (NICODERM CQ - dosed in mg/24 hours) patch 21 mg  21 mg Transdermal Daily Viann Fish E, MD   21 mg at 02/13/21 0910  . OXcarbazepine (TRILEPTAL) tablet 150 mg  150 mg Oral BID Arthor Captain, MD   150 mg at 02/13/21 1705  . polyethylene glycol (MIRALAX / GLYCOLAX) packet 17 g  17 g Oral Daily Arthor Captain, MD   17 g at 02/13/21 0910  . sertraline (ZOLOFT) tablet 150 mg  150 mg Oral Daily Arthor Captain, MD   150 mg at 02/13/21 0906  . tamsulosin (FLOMAX) capsule 0.4 mg  0.4 mg Oral Daily  Derrill Center, NP   0.4 mg at 02/13/21 0905  . thiamine tablet 100 mg  100 mg Oral Daily Arthor Captain, MD   100 mg at 02/13/21 7425  . traZODone (DESYREL) tablet 50 mg  50 mg Oral QHS PRN Derrill Center, NP   50 mg at 02/12/21 2128    Lab Results:  No results found for this or any previous visit (from the past 48 hour(s)).  Blood Alcohol level:  Lab Results  Component Value Date   ETH <10 02/07/2021   ETH <5 06/05/3006    Metabolic Disorder Labs: Lab Results  Component Value Date   HGBA1C 5.6 02/10/2021   MPG 114 02/10/2021   No results found for: PROLACTIN Lab Results  Component Value Date   CHOL 163 02/10/2021   TRIG 409 (H) 02/10/2021   HDL 34 (L) 02/10/2021   CHOLHDL 4.8 02/10/2021   VLDL UNABLE TO CALCULATE IF TRIGLYCERIDE OVER 400 mg/dL 02/10/2021   LDLCALC UNABLE TO CALCULATE IF TRIGLYCERIDE OVER 400 mg/dL 02/10/2021    Physical Findings: AIMS: Facial and Oral Movements Muscles of Facial Expression: None, normal Lips and Perioral Area: None, normal Jaw: None, normal Tongue: None, normal,Extremity Movements Upper (arms, wrists, hands, fingers): None, normal Lower (legs, knees, ankles, toes): None, normal, Trunk Movements Neck, shoulders, hips: None, normal, Overall Severity Severity of abnormal movements (highest score from questions above): None, normal Incapacitation due to abnormal movements: None, normal Patient's awareness of abnormal movements (rate only patient's report): No Awareness, Dental Status Current problems with teeth and/or dentures?: No Does patient usually wear dentures?: No  CIWA:  CIWA-Ar Total: 1 COWS:     Musculoskeletal: Strength & Muscle Tone: within normal limits Gait & Station: normal Patient leans: N/A  Psychiatric Specialty Exam:  Presentation  General Appearance: Disheveled  Eye Contact:Fair  Speech:Clear and Coherent  Speech Volume:Decreased  Handedness:No data recorded  Mood and Affect  Mood:Anxious;  Depressed  Affect:Congruent   Thought Process  Thought Processes:Coherent; Goal Directed; Linear  Descriptions of Associations:Intact  Orientation:Full (Time, Place and Person)  Thought Content:Logical  History of Schizophrenia/Schizoaffective disorder:No  Duration of Psychotic Symptoms:No data recorded Hallucinations:Hallucinations: None  Ideas of Reference:None  Suicidal Thoughts:Suicidal Thoughts: Yes, Passive (Denies thoughts of harming himself in the hospital.) SI Passive Intent and/or Plan: Without Intent; Without Plan  Homicidal Thoughts:Homicidal Thoughts: No   Sensorium  Memory:Immediate Good; Recent Good; Remote Good  Judgment:Fair  Insight:Fair   Executive Functions  Concentration:Good  Attention Span:Good  Recall:Good  Fund of Knowledge:Good  Language:Good   Psychomotor Activity  Psychomotor Activity:Psychomotor Activity: Decreased   Assets  Assets:Communication Skills; Desire for Improvement; Resilience; Vocational/Educational   Sleep  Sleep:Sleep: Fair Number of Hours of Sleep: 6.75    Physical Exam: Physical Exam Vitals and nursing note reviewed.  HENT:     Head: Normocephalic and atraumatic.  Pulmonary:     Effort: Pulmonary effort is normal.  Neurological:     General: No focal deficit present.     Mental Status: He is alert and oriented to person, place, and time.    Review of Systems  Constitutional: Positive for malaise/fatigue. Negative for chills, diaphoresis and fever.  Respiratory: Negative.   Cardiovascular: Negative.   Gastrointestinal: Negative for diarrhea, nausea and vomiting.  Neurological: Negative.   Psychiatric/Behavioral: Positive for depression and suicidal ideas. Negative for hallucinations. The patient is nervous/anxious. The patient does not have insomnia.    Blood pressure 92/74, pulse 91, temperature 98 F (36.7 C), temperature source Oral, resp. rate 20, height '6\' 3"'  (1.905 m), weight 90.7 kg,  SpO2 100 %. Body mass index is 25 kg/m.   Treatment Plan Summary:  Daily contact with patient to assess and evaluate symptoms and progress in treatment  and Medication management   Continue every 15-minute observation level  Encouraged participation in group therapy and therapeutic milieu  Encouraged increase p.o. intake of fluids and food  Depression -Continue Zoloft 150 mg daily for depression and anxiety -Continue Abilify 5 mg daily for adjunctive treatment of mood symptoms -Start Trileptal 150 mg twice daily as adjunctive treatment for mood symptoms  Anxiety -Continue Zoloft 150 mg daily for depression and anxiety -Decrease alprazolam to 0.5 mg BID -Continue hydroxyzine 25 mg every 6 hours PRN  Substance use disorder  -Patient will need referral to residential or outpatient substance abuse treatment program at discharge for treatment of cocaine use.  Patient does not appear motivated to engage in substance abuse treatment at this time. -Discontinue CIWA protocol as patient has not reported any symptoms or displayed any signs consistent with benzodiazepine withdrawal  Insomnia -Continue trazodone 50 mg at bedtime PRN  Constipation -Start MiraLAX 17 g daily -Continue Colace 100 mg daily -Continue milk of magnesia 30 mL daily PRN  BPH -Continue tamsulosin 0.4 mg daily  GERD -Continue famotidine 20 mg daily  Hypothyroidism -Continue levothyroxine 25 mcg daily -TSH slightly elevated and free T4 WNL  Disposition planning in progress   Arthor Captain, MD 02/13/2021, 5:50 PM

## 2021-02-13 NOTE — Progress Notes (Signed)
Pt did not attend wrap-up group   

## 2021-02-13 NOTE — Progress Notes (Signed)
Pt c/o difficulty sleeping. On call provider contacted by this Clinical research associate. Provider ordered PRN medication. At time medication order entered and medication available. Pt was asleep. This Clinical research associate did not awaken pt to give sleep medication. Will reassess the need if and when pt awakes. Safety maintained.

## 2021-02-13 NOTE — Tx Team (Signed)
Interdisciplinary Treatment and Diagnostic Plan Update  02/13/2021 Time of Session: 9:45am Christopher Giles MRN: [id number]  Principal Diagnosis: MDD (major depressive disorder)  Secondary Diagnoses: Principal Problem:   MDD (major depressive disorder) Active Problems:   Moderate cocaine use disorder (HCC)   Current Medications:  Current Facility-Administered Medications  Medication Dose Route Frequency Provider Last Rate Last Admin  . acetaminophen (TYLENOL) tablet 650 mg  650 mg Oral Q6H PRN Oneta Rack, NP   650 mg at 02/12/21 1811  . ALPRAZolam Prudy Feeler) tablet 0.5 mg  0.5 mg Oral TID Claudie Revering, MD   0.5 mg at 02/13/21 0905  . alum & mag hydroxide-simeth (MAALOX/MYLANTA) 200-200-20 MG/5ML suspension 30 mL  30 mL Oral Q4H PRN Oneta Rack, NP      . ARIPiprazole (ABILIFY) tablet 5 mg  5 mg Oral Daily Oneta Rack, NP   5 mg at 02/13/21 0905  . docusate sodium (COLACE) capsule 100 mg  100 mg Oral Daily Claudie Revering, MD   100 mg at 02/13/21 2951  . famotidine (PEPCID) tablet 20 mg  20 mg Oral QHS Oneta Rack, NP   20 mg at 02/12/21 2125  . feeding supplement (ENSURE ENLIVE / ENSURE PLUS) liquid 237 mL  237 mL Oral BID BM Akintayo, Mojeed, MD   237 mL at 02/13/21 0915  . hydrOXYzine (ATARAX/VISTARIL) tablet 25 mg  25 mg Oral BID PRN White, Patrice L, NP      . levothyroxine (SYNTHROID) tablet 25 mcg  25 mcg Oral Q0600 Oneta Rack, NP   25 mcg at 02/13/21 0629  . magnesium hydroxide (MILK OF MAGNESIA) suspension 30 mL  30 mL Oral Daily PRN Oneta Rack, NP   30 mL at 02/11/21 1307  . multivitamin with minerals tablet 1 tablet  1 tablet Oral Daily Claudie Revering, MD   1 tablet at 02/13/21 8841  . nicotine (NICODERM CQ - dosed in mg/24 hours) patch 21 mg  21 mg Transdermal Daily Bartholomew Crews E, MD   21 mg at 02/13/21 0910  . polyethylene glycol (MIRALAX / GLYCOLAX) packet 17 g  17 g Oral Daily Claudie Revering, MD   17 g at 02/13/21 0910  . sertraline (ZOLOFT)  tablet 150 mg  150 mg Oral Daily Claudie Revering, MD   150 mg at 02/13/21 0906  . tamsulosin (FLOMAX) capsule 0.4 mg  0.4 mg Oral Daily Oneta Rack, NP   0.4 mg at 02/13/21 0905  . thiamine tablet 100 mg  100 mg Oral Daily Claudie Revering, MD   100 mg at 02/13/21 6606  . traZODone (DESYREL) tablet 50 mg  50 mg Oral QHS PRN Oneta Rack, NP   50 mg at 02/12/21 2128   PTA Medications: Medications Prior to Admission  Medication Sig Dispense Refill Last Dose  . atorvastatin (LIPITOR) 40 MG tablet Take 40 mg by mouth every evening.     . divalproex (DEPAKOTE ER) 500 MG 24 hr tablet Take 1,000 mg by mouth at bedtime.     . naproxen sodium (ALEVE) 220 MG tablet Take 220-440 mg by mouth 2 (two) times daily as needed (for pain or headaches).     Marland Kitchen RAPAFLO 8 MG CAPS capsule Take 8 mg by mouth at bedtime.       Patient Stressors: Financial difficulties Substance abuse  Patient Strengths: Ability for insight Capable of independent living Wellsite geologist fund of knowledge Work skills  Treatment  Modalities: Medication Management, Group therapy, Case management,  1 to 1 session with clinician, Psychoeducation, Recreational therapy.   Physician Treatment Plan for Primary Diagnosis: MDD (major depressive disorder) Long Term Goal(s): Improvement in symptoms so as ready for discharge Improvement in symptoms so as ready for discharge   Short Term Goals: Ability to identify changes in lifestyle to reduce recurrence of condition will improve Ability to verbalize feelings will improve Ability to disclose and discuss suicidal ideas Ability to demonstrate self-control will improve Ability to identify and develop effective coping behaviors will improve Compliance with prescribed medications will improve Ability to identify triggers associated with substance abuse/mental health issues will improve Ability to identify changes in lifestyle to reduce recurrence of condition will  improve Ability to verbalize feelings will improve Ability to disclose and discuss suicidal ideas Ability to demonstrate self-control will improve Ability to identify and develop effective coping behaviors will improve Compliance with prescribed medications will improve Ability to identify triggers associated with substance abuse/mental health issues will improve  Medication Management: Evaluate patient's response, side effects, and tolerance of medication regimen.  Therapeutic Interventions: 1 to 1 sessions, Unit Group sessions and Medication administration.  Evaluation of Outcomes: Progressing  Physician Treatment Plan for Secondary Diagnosis: Principal Problem:   MDD (major depressive disorder) Active Problems:   Moderate cocaine use disorder (HCC)  Long Term Goal(s): Improvement in symptoms so as ready for discharge Improvement in symptoms so as ready for discharge   Short Term Goals: Ability to identify changes in lifestyle to reduce recurrence of condition will improve Ability to verbalize feelings will improve Ability to disclose and discuss suicidal ideas Ability to demonstrate self-control will improve Ability to identify and develop effective coping behaviors will improve Compliance with prescribed medications will improve Ability to identify triggers associated with substance abuse/mental health issues will improve Ability to identify changes in lifestyle to reduce recurrence of condition will improve Ability to verbalize feelings will improve Ability to disclose and discuss suicidal ideas Ability to demonstrate self-control will improve Ability to identify and develop effective coping behaviors will improve Compliance with prescribed medications will improve Ability to identify triggers associated with substance abuse/mental health issues will improve     Medication Management: Evaluate patient's response, side effects, and tolerance of medication  regimen.  Therapeutic Interventions: 1 to 1 sessions, Unit Group sessions and Medication administration.  Evaluation of Outcomes: Progressing   RN Treatment Plan for Primary Diagnosis: MDD (major depressive disorder) Long Term Goal(s): Knowledge of disease and therapeutic regimen to maintain health will improve  Short Term Goals: Ability to remain free from injury will improve, Ability to verbalize frustration and anger appropriately will improve, Ability to demonstrate self-control, Ability to identify and develop effective coping behaviors will improve and Compliance with prescribed medications will improve  Medication Management: RN will administer medications as ordered by provider, will assess and evaluate patient's response and provide education to patient for prescribed medication. RN will report any adverse and/or side effects to prescribing provider.  Therapeutic Interventions: 1 on 1 counseling sessions, Psychoeducation, Medication administration, Evaluate responses to treatment, Monitor vital signs and CBGs as ordered, Perform/monitor CIWA, COWS, AIMS and Fall Risk screenings as ordered, Perform wound care treatments as ordered.  Evaluation of Outcomes: Progressing   LCSW Treatment Plan for Primary Diagnosis: MDD (major depressive disorder) Long Term Goal(s): Safe transition to appropriate next level of care at discharge, Engage patient in therapeutic group addressing interpersonal concerns.  Short Term Goals: Engage patient in aftercare planning with referrals  and resources, Increase social support, Increase ability to appropriately verbalize feelings, Identify triggers associated with mental health/substance abuse issues and Increase skills for wellness and recovery  Therapeutic Interventions: Assess for all discharge needs, 1 to 1 time with Social worker, Explore available resources and support systems, Assess for adequacy in community support network, Educate family and  significant other(s) on suicide prevention, Complete Psychosocial Assessment, Interpersonal group therapy.  Evaluation of Outcomes: Progressing   Progress in Treatment: Attending groups: No. Participating in groups: No. Taking medication as prescribed: Yes. Toleration medication: Yes. Family/Significant other contact made: No, will contact:  if consent is provided Patient understands diagnosis: Yes. Discussing patient identified problems/goals with staff: Yes. Medical problems stabilized or resolved: Yes. Denies suicidal/homicidal ideation: Yes. Issues/concerns per patient self-inventory: No.   New problem(s) identified: No, Describe:  none  New Short Term/Long Term Goal(s): medication stabilization, elimination of SI thoughts, development of comprehensive mental wellness plan.   Patient Goals:  Did not attend  Discharge Plan or Barriers: Patient recently admitted. CSW will continue to follow and assess for appropriate referrals and possible discharge planning.    Reason for Continuation of Hospitalization: Depression Medication stabilization Suicidal ideation  Estimated Length of Stay: 3-5 days  Attendees: Patient: Did not attend 02/09/2021   Physician:  02/09/2021   Nursing:  02/09/2021   RN Care Manager: 02/09/2021   Social Worker: Ruthann Cancer, LCSW 02/09/2021   Recreational Therapist:  02/09/2021   Other:  02/09/2021   Other:  02/09/2021  Other: 02/09/2021       Scribe for Treatment Team: Felizardo Hoffmann, LCSWA 02/13/2021 11:47 AM

## 2021-02-13 NOTE — Progress Notes (Signed)
   02/12/21 2300  Psych Admission Type (Psych Patients Only)  Admission Status Voluntary  Psychosocial Assessment  Patient Complaints Depression  Eye Contact Fair  Facial Expression Anxious  Affect Anxious;Depressed  Speech Logical/coherent  Interaction Assertive  Motor Activity Slow  Appearance/Hygiene In scrubs  Behavior Characteristics Cooperative;Appropriate to situation  Mood Depressed  Thought Process  Coherency WDL  Content WDL  Delusions WDL;None reported or observed  Perception WDL  Hallucination None reported or observed  Judgment Poor  Confusion None  Danger to Self  Current suicidal ideation? Denies  Danger to Others  Danger to Others None reported or observed

## 2021-02-14 MED ORDER — IBUPROFEN 400 MG PO TABS
400.0000 mg | ORAL_TABLET | ORAL | Status: DC | PRN
  Administered 2021-02-14 – 2021-02-15 (×2): 400 mg via ORAL
  Filled 2021-02-14 (×3): qty 1

## 2021-02-14 NOTE — Progress Notes (Signed)
[facility] MD Progress Note  02/14/2021 4:12 PM Christopher Giles  MRN:  [id number]   Subjective: Christopher Giles reports, "I'm still feeling depressed & suicidal. I don't what I would do if I get discharged today".  Reason for admission: Christopher Giles a 55 year old male a history of depression, anxiety, hypothyroidism and cocaine use disorder who presented to [name] ED with worsening depressive symptoms and suicidal ideation with a plan to jump off the bridge in the context of multiple psychosocial stressors and relapse on cocaine.   Objective: Medical record reviewed.  Patient's case discussed in detail with members of the treatment team.  I met with and evaluated the patient for follow-up today on the unit.  Patient was in bed but willing to go to the office on the unit to meet with me.  He continues to present as disheveled with constricted affect.  Patient states that he feels sad and hopeless and that he heard he was going to be discharged soon which upset him.  He states he feels he is not ready to be discharged and has thoughts of stabbing himself in the trachea or taking pills if he is sent home.  He denies suicidal ideation in the hospital.  He denies AI or HI.  He states that he is not interested in going to a substance use disorder treatment program after discharge.  He denies any side effects to his medication or any physical problems.  He reports that his sleep is not good however he is spending most of the day in bed dozing on and off.  I advised him that he needs to stay awake during the day and participate in groups which is part of his treatment.  Patient stated understanding.  We discussed starting Trileptal and decreasing alprazolam to 0.5 mg twice daily and continuing other medications at current doses.  Patient is in agreement with this plan.  I discussed with him that he will likely be discharged soon and encouraged him to reconsider going to a substance abuse treatment program after discharge.   Hours of sleep were not recorded for last night.  Per chart notes, patient has not been attending groups and has been spending most of his time in his room asleep.  He does get up for meals and appetite has been good.  Principal Problem: MDD (major depressive disorder) Diagnosis: Principal Problem:   MDD (major depressive disorder) Active Problems:   Moderate cocaine use disorder (HCC)  Total Time spent with patient: 15 minutes  Past Psychiatric History: See admission H&P  Past Medical History:  Past Medical History:  Diagnosis Date  . Anxiety   . Depression   . Moderate cocaine use disorder ([place]) 02/09/2021  . Substance abuse ([place])   . Suicide attempt ([place])    x 6 per pt  . Thyroid disease    hypo    Past Surgical History:  Procedure Laterality Date  . punctured lung     Family History:  Family History  Problem Relation Age of Onset  . Hyperlipidemia Mother   . Cancer Mother   . Cancer Father   . Diabetes Maternal Grandmother    Family Psychiatric  History: See admission H&P Social History:  Social History   Substance and Sexual Activity  Alcohol Use No     Social History   Substance and Sexual Activity  Drug Use No    Social History   Socioeconomic History  . Marital status: Single    Spouse name: Not  on file  . Number of children: Not on file  . Years of education: 90  . Highest education level: High school graduate  Occupational History  . Not on file  Tobacco Use  . Smoking status: Current Every Day Smoker    Packs/day: 1.00    Types: Cigarettes  . Smokeless tobacco: Never Used  Substance and Sexual Activity  . Alcohol use: No  . Drug use: No  . Sexual activity: Never    Birth control/protection: Abstinence  Other Topics Concern  . Not on file  Social History Narrative  . Not on file   Social Determinants of Health   Financial Resource Strain: Not on file  Food Insecurity: Not on file  Transportation Needs: Not on file  Physical  Activity: Not on file  Stress: Not on file  Social Connections: Not on file   Additional Social History:   Sleep: Good  Appetite:  Fair  Current Medications: Current Facility-Administered Medications  Medication Dose Route Frequency Provider Last Rate Last Admin  . acetaminophen (TYLENOL) tablet 650 mg  650 mg Oral Q6H PRN Derrill Center, NP   650 mg at 02/14/21 1234  . ALPRAZolam Duanne Moron) tablet 0.5 mg  0.5 mg Oral BID Arthor Captain, MD   0.5 mg at 02/14/21 0843  . alum & mag hydroxide-simeth (MAALOX/MYLANTA) 200-200-20 MG/5ML suspension 30 mL  30 mL Oral Q4H PRN Derrill Center, NP      . ARIPiprazole (ABILIFY) tablet 5 mg  5 mg Oral Daily Derrill Center, NP   5 mg at 02/14/21 0843  . docusate sodium (COLACE) capsule 100 mg  100 mg Oral Daily Arthor Captain, MD   100 mg at 02/14/21 0843  . famotidine (PEPCID) tablet 20 mg  20 mg Oral QHS Derrill Center, NP   20 mg at 02/13/21 2133  . feeding supplement (ENSURE ENLIVE / ENSURE PLUS) liquid 237 mL  237 mL Oral BID BM Akintayo, Mojeed, MD   237 mL at 02/13/21 1707  . hydrOXYzine (ATARAX/VISTARIL) tablet 25 mg  25 mg Oral BID PRN White, Patrice L, NP   25 mg at 02/13/21 2133  . ibuprofen (ADVIL) tablet 400 mg  400 mg Oral Q4H PRN Lindell Spar I, NP      . levothyroxine (SYNTHROID) tablet 25 mcg  25 mcg Oral Q0600 Derrill Center, NP   25 mcg at 02/14/21 [phone]  . magnesium hydroxide (MILK OF MAGNESIA) suspension 30 mL  30 mL Oral Daily PRN Derrill Center, NP   30 mL at 02/11/21 1307  . multivitamin with minerals tablet 1 tablet  1 tablet Oral Daily Arthor Captain, MD   1 tablet at 02/14/21 0900  . nicotine (NICODERM CQ - dosed in mg/24 hours) patch 21 mg  21 mg Transdermal Daily Nelda Marseille, Amy E, MD   21 mg at 02/14/21 0845  . OXcarbazepine (TRILEPTAL) tablet 150 mg  150 mg Oral BID Arthor Captain, MD   150 mg at 02/14/21 0843  . polyethylene glycol (MIRALAX / GLYCOLAX) packet 17 g  17 g Oral Daily Arthor Captain, MD   17 g at  02/14/21 0843  . sertraline (ZOLOFT) tablet 150 mg  150 mg Oral Daily Arthor Captain, MD   150 mg at 02/14/21 0843  . tamsulosin (FLOMAX) capsule 0.4 mg  0.4 mg Oral Daily Derrill Center, NP   0.4 mg at 02/14/21 0843  . thiamine tablet 100 mg  100 mg  Oral Daily Arthor Captain, MD   100 mg at 02/14/21 0843  . traZODone (DESYREL) tablet 50 mg  50 mg Oral QHS PRN Derrill Center, NP   50 mg at 02/13/21 2133   Lab Results:  No results found for this or any previous visit (from the past 48 hour(s)).  Blood Alcohol level:  Lab Results  Component Value Date   ETH <10 02/07/2021   ETH <5 16/06/9603   Metabolic Disorder Labs: Lab Results  Component Value Date   HGBA1C 5.6 02/10/2021   MPG 114 02/10/2021   No results found for: PROLACTIN Lab Results  Component Value Date   CHOL 163 02/10/2021   TRIG 409 (H) 02/10/2021   HDL 34 (L) 02/10/2021   CHOLHDL 4.8 02/10/2021   VLDL UNABLE TO CALCULATE IF TRIGLYCERIDE OVER 400 mg/dL 02/10/2021   LDLCALC UNABLE TO CALCULATE IF TRIGLYCERIDE OVER 400 mg/dL 02/10/2021   Physical Findings: AIMS: Facial and Oral Movements Muscles of Facial Expression: None, normal Lips and Perioral Area: None, normal Jaw: None, normal Tongue: None, normal,Extremity Movements Upper (arms, wrists, hands, fingers): None, normal Lower (legs, knees, ankles, toes): None, normal, Trunk Movements Neck, shoulders, hips: None, normal, Overall Severity Severity of abnormal movements (highest score from questions above): None, normal Incapacitation due to abnormal movements: None, normal Patient's awareness of abnormal movements (rate only patient's report): No Awareness, Dental Status Current problems with teeth and/or dentures?: No Does patient usually wear dentures?: No  CIWA:  CIWA-Ar Total: 2 COWS:     Musculoskeletal: Strength & Muscle Tone: within normal limits Gait & Station: normal Patient leans: N/A  Psychiatric Specialty Exam:  Presentation  General  Appearance: Disheveled  Eye Contact:Fair  Speech:Clear and Coherent  Speech Volume:Decreased  Handedness:No data recorded  Mood and Affect  Mood:Anxious; Depressed  Affect:Congruent   Thought Process  Thought Processes:Coherent; Goal Directed; Linear  Descriptions of Associations:Intact  Orientation:Full (Time, Place and Person)  Thought Content:Logical  History of Schizophrenia/Schizoaffective disorder:No  Duration of Psychotic Symptoms:No data recorded Hallucinations:Hallucinations: None  Ideas of Reference:None  Suicidal Thoughts:Suicidal Thoughts: Yes, Passive (Denies thoughts of harming himself in the hospital.) SI Passive Intent and/or Plan: Without Intent; Without Plan  Homicidal Thoughts:Homicidal Thoughts: No   Sensorium  Memory:Immediate Good; Recent Good; Remote Good  Judgment:Fair  Insight:Fair   Executive Functions  Concentration:Good  Attention Span:Good  Recall:Good  Fund of Knowledge:Good  Language:Good   Psychomotor Activity  Psychomotor Activity:Psychomotor Activity: Decreased   Assets  Assets:Communication Skills; Desire for Improvement; Resilience; Vocational/Educational   Sleep  Sleep:Sleep: Fair  Physical Exam: Physical Exam Vitals and nursing note reviewed.  HENT:     Head: Normocephalic and atraumatic.  Pulmonary:     Effort: Pulmonary effort is normal.  Neurological:     General: No focal deficit present.     Mental Status: He is alert and oriented to person, place, and time.    Review of Systems  Constitutional: Positive for malaise/fatigue. Negative for chills, diaphoresis and fever.  Respiratory: Negative.   Cardiovascular: Negative.   Gastrointestinal: Negative for diarrhea, nausea and vomiting.  Neurological: Negative.   Psychiatric/Behavioral: Positive for depression and suicidal ideas. Negative for hallucinations. The patient is nervous/anxious. The patient does not have insomnia.    Blood  pressure 104/66, pulse 66, temperature (!) 97.5 F (36.4 C), temperature source Oral, resp. rate 20, height '6\' 3"'  (1.905 m), weight 90.7 kg, SpO2 100 %. Body mass index is 25 kg/m.   Treatment Plan Summary:  Daily contact  with patient to assess and evaluate symptoms and progress in treatment and Medication management   Continue every 15-minute observation level  Encouraged participation in group therapy and therapeutic milieu  Encouraged increase p.o. intake of fluids and food  Depression -Continue Zoloft 150 mg daily for depression and anxiety -Continue Abilify 5 mg daily for adjunctive treatment of mood symptoms -ContinueTrileptal 150 mg twice daily as adjunctive treatment for mood symptoms  Anxiety -Continue Zoloft 150 mg daily for depression and anxiety -Continue alprazolam to 0.5 mg BID -Continue hydroxyzine 25 mg every 6 hours PRN  Substance use disorder  -Patient will need referral to residential or outpatient substance abuse treatment program at discharge for treatment of cocaine use.  Patient does not appear motivated to engage in substance abuse treatment at this time. -Discontinued CIWA protocol as patient has not reported any symptoms or displayed any signs consistent with benzodiazepine withdrawal  Insomnia -Continue trazodone 50 mg at bedtime PRN  Constipation -Continue MiraLAX 17 g daily -Continue Colace 100 mg daily -Continue milk of magnesia 30 mL daily PRN. -Continue Ibuprofen 400 mg po q 4 hrs prn for pain  BPH -Continue tamsulosin 0.4 mg daily  GERD -Continue famotidine 20 mg daily  Hypothyroidism -Continue levothyroxine 25 mcg daily -TSH slightly elevated and free T4 WNL  Disposition planning in progress  Lindell Spar, NP, pmhnp, fnp-bc 02/14/2021, 4:12 PMPatient ID: Rob Bunting, male   DOB: Jun 23, 1966, 55 y.o.   MRN: [id number]

## 2021-02-14 NOTE — Progress Notes (Signed)
   02/14/21 2324  COVID-19 Daily Checkoff  Have you had a fever (temp > 37.80C/100F)  in the past 24 hours?  No  If you have had runny nose, nasal congestion, sneezing in the past 24 hours, has it worsened? No  COVID-19 EXPOSURE  Have you traveled outside the state in the past 14 days? No  Have you been in contact with someone with a confirmed diagnosis of COVID-19 or PUI in the past 14 days without wearing appropriate PPE? No  Have you been living in the same home as a person with confirmed diagnosis of COVID-19 or a PUI (household contact)? No  Have you been diagnosed with COVID-19? No

## 2021-02-14 NOTE — Progress Notes (Signed)
D. Pt presents with a sad affect, depressed mood- has been calm and cooperative on the unit, and has been visible interacting appropriately with peers and staff, and attending group led by SW.  Pt was observed coming back from lunch early with MHT pushing him in a wheelchair. Per MHT, pt let himself down to his knees in the cafeteria, after complaining that his "back gave out". Pt reported 'feeling dizzy', and rated his back pain a 7/10.  A. Labs and vitals monitored. Pt noted to be hypotensive- 90/63. Pt given prn Tylenol and ice pack for his back. Pt provided with Gatorade and encouraged to drink. Pt supported emotionally and encouraged to express concerns and ask questions.   R. Pt remains safe with 15 minute checks. Will continue POC.

## 2021-02-14 NOTE — [facility] Group Notes (Signed)
LCSW Group Therapy Note  02/14/2021    10:00-11:00am   Type of Therapy and Topic:  Group Therapy: Early Messages Received About Anger  Participation Level:  Active   Description of Group:   In this group, patients shared and discussed the early messages received in their lives about anger through parental or other adult modeling, teaching, repression, punishment, violence, and more.  Participants identified how those childhood lessons influence even now how they usually or often react when angered.  The group discussed that anger is a secondary emotion and what may be the underlying emotional themes that come out through anger outbursts or that are ignored through anger suppression.    Therapeutic Goals: Patients will identify one or more childhood message about anger that they received and how it was taught to them. Patients will discuss how these childhood experiences have influenced and continue to influence their own expression or repression of anger even today. Patients will explore possible primary emotions that tend to fuel their secondary emotion of anger. Patients will learn that anger itself is normal and cannot be eliminated, and that healthier coping skills can assist with resolving conflict rather than worsening situations.  Summary of Patient Progress:  The patient shared that his childhood lessons about anger were from parents who were often angry at each other and fought a lot.  As a result, he holds in anger a lot of the time, but at other times will lash out over small things.  The patient participated fully and demonstrated insight.  Therapeutic Modalities:   Cognitive Behavioral Therapy Motivation Interviewing  Lynnell Chad  .

## 2021-02-14 NOTE — Plan of Care (Signed)
Patient was cooperative with treatment , he spent of the shift in bed resting, he remains sad and depressed. He denies SI & AVH. He was compliant with medications on shift. He appears to be in bed resting quietly at the time.

## 2021-02-14 NOTE — Progress Notes (Signed)
[place] NOVEL CORONAVIRUS (COVID-19) DAILY CHECK-OFF SYMPTOMS - answer yes or no to each - every day NO YES  Have you had a fever in the past 24 hours?  . Fever (Temp > 37.80C / 100F) X   Have you had any of these symptoms in the past 24 hours? . New Cough .  Sore Throat  .  Shortness of Breath .  Difficulty Breathing .  Unexplained Body Aches   X   Have you had any one of these symptoms in the past 24 hours not related to allergies?   . Runny Nose .  Nasal Congestion .  Sneezing   X   If you have had runny nose, nasal congestion, sneezing in the past 24 hours, has it worsened?  X   EXPOSURES - check yes or no X   Have you traveled outside the state in the past 14 days?  X   Have you been in contact with someone with a confirmed diagnosis of COVID-19 or PUI in the past 14 days without wearing appropriate PPE?  X   Have you been living in the same home as a person with confirmed diagnosis of COVID-19 or a PUI (household contact)?    X   Have you been diagnosed with COVID-19?    X              What to do next: Answered NO to all: Answered YES to anything:   Proceed with unit schedule Follow the [facility] Inpatient Flowsheet.   

## 2021-02-14 NOTE — Progress Notes (Signed)
   02/14/21 2330  Psych Admission Type (Psych Patients Only)  Admission Status Voluntary  Psychosocial Assessment  Patient Complaints Depression  Eye Contact Brief  Facial Expression Flat  Affect Appropriate to circumstance  Speech Logical/coherent  Interaction Assertive  Motor Activity Slow  Appearance/Hygiene Unremarkable  Behavior Characteristics Appropriate to situation  Mood Depressed  Thought Process  Coherency WDL  Content WDL  Delusions None reported or observed  Perception WDL  Hallucination None reported or observed  Judgment Poor  Confusion None  Danger to Self  Current suicidal ideation? Denies  Self-Injurious Behavior No self-injurious ideation or behavior indicators observed or expressed   Agreement Not to Harm Self Yes  Description of Agreement verbal  Danger to Others  Danger to Others None reported or observed

## 2021-02-15 MED ORDER — TRAZODONE HCL 50 MG PO TABS: 50.0000 mg | ORAL_TABLET | Freq: Every evening | ORAL | 0 refills | Status: DC | PRN

## 2021-02-15 MED ORDER — TAMSULOSIN HCL 0.4 MG PO CAPS: 0.4000 mg | ORAL_CAPSULE | Freq: Every day | ORAL | 0 refills | Status: DC

## 2021-02-15 MED ORDER — ALPRAZOLAM 0.5 MG PO TABS: 0.5000 mg | ORAL_TABLET | Freq: Two times a day (BID) | ORAL | 0 refills | Status: DC

## 2021-02-15 MED ORDER — FAMOTIDINE 20 MG PO TABS: 20.0000 mg | ORAL_TABLET | Freq: Every day | ORAL | 0 refills | Status: DC

## 2021-02-15 MED ORDER — DOCUSATE SODIUM 100 MG PO CAPS: 100.0000 mg | ORAL_CAPSULE | Freq: Every day | ORAL | 0 refills | Status: DC

## 2021-02-15 MED ORDER — LEVOTHYROXINE SODIUM 25 MCG PO TABS: 25.0000 ug | ORAL_TABLET | Freq: Every day | ORAL | 0 refills | Status: DC

## 2021-02-15 MED ORDER — NICOTINE 21 MG/24HR TD PT24: 21.0000 mg | MEDICATED_PATCH | Freq: Every day | TRANSDERMAL | 0 refills | Status: DC

## 2021-02-15 MED ORDER — POLYETHYLENE GLYCOL 3350 17 G PO PACK: 17.0000 g | PACK | Freq: Every day | ORAL | 0 refills | Status: DC

## 2021-02-15 MED ORDER — HYDROXYZINE HCL 25 MG PO TABS: 25.0000 mg | ORAL_TABLET | Freq: Two times a day (BID) | ORAL | 0 refills | Status: DC | PRN

## 2021-02-15 MED ORDER — OXCARBAZEPINE 150 MG PO TABS: 150.0000 mg | ORAL_TABLET | Freq: Two times a day (BID) | ORAL | 0 refills | Status: DC

## 2021-02-15 MED ORDER — SERTRALINE HCL 50 MG PO TABS: 150.0000 mg | ORAL_TABLET | Freq: Every day | ORAL | 0 refills | Status: DC

## 2021-02-15 MED ORDER — ADULT MULTIVITAMIN W/MINERALS CH: 1.0000 | ORAL_TABLET | Freq: Every day | ORAL | 0 refills | Status: DC

## 2021-02-15 MED ORDER — ARIPIPRAZOLE 5 MG PO TABS: 5.0000 mg | ORAL_TABLET | Freq: Every day | ORAL | 0 refills | Status: DC

## 2021-02-15 NOTE — [facility] Suicide Risk Assessment (Signed)
[facility] INPATIENT:  Family/Significant Other Suicide Prevention Education  Suicide Prevention Education:  Education Completed; [name] (sister) [phone],  (name of family member/significant other) has been identified by the patient as the family member/significant other with whom the patient will be residing, and identified as the person(s) who will aid the patient in the event of a mental health crisis (suicidal ideations/suicide attempt).  With written consent from the patient, the family member/significant other has been provided the following suicide prevention education, prior to the and/or following the discharge of the patient.  The suicide prevention education provided includes the following:  Suicide risk factors  Suicide prevention and interventions  National Suicide Hotline telephone number  [facility] assessment telephone number  [facility] Emergency Assistance 911  [facility] and/or Residential Mobile Crisis Unit telephone number  Request made of family/significant other to:  Remove weapons (e.g., guns, rifles, knives), all items previously/currently identified as safety concern.    Remove drugs/medications (over-the-counter, prescriptions, illicit drugs), all items previously/currently identified as a safety concern.  The family member/significant other verbalizes understanding of the suicide prevention education information provided.  The family member/significant other agrees to remove the items of safety concern listed above.  Christopher Giles 02/15/2021, 12:28 PM

## 2021-02-15 NOTE — [facility] Group Notes (Signed)
[facility] LCSW Group Therapy Note  02/15/2021    Type of Therapy and Topic:  Group Therapy:  A Hero Worthy of Support  Participation Level:  Did Not Attend   Description of Group:  Patients in this group were introduced to the concept that additional supports including self-support are an essential part of recovery.  Matching needs with supports to help fulfill those needs was explained.  Establishing boundaries that can gradually be increased or decreased was described, with patients giving their own examples of establishing appropriate boundaries in their lives.  A song entitled "My Own Hero" was played and a group discussion ensued in which patients stated it inspired them to help themselves in order to succeed, because other people cannot achieve their goals such as sobriety or stability for them.  A song was played called "I Am Enough" which led to a discussion about being willing to believe we are worth the effort of being a self-support.   Therapeutic Goals: 1)  demonstrate the importance of being a key part of one's own support system 2)  discuss various available supports 3)  encourage patient to use music as part of their self-support and focus on goals 4)  elicit ideas from patients about supports that need to be added   Summary of Patient Progress:  The patient did not attend, although invited in person by MHT and overhead announcement.  Therapeutic Modalities:   Motivational Interviewing Activity  Lynnell Chad

## 2021-02-15 NOTE — Progress Notes (Signed)
  [facility] Adult Case Management Discharge Plan :  Will you be returning to the same living situation after discharge:  Yes,  apartment At discharge, do you have transportation home?: Yes,  accepts bus passes Do you have the ability to pay for your medications: No.  Release of information consent forms completed and emailed to Medical Records, then turned in to Medical Records by CSW.   Patient to Follow up at:  Follow-up Information    [facility] Follow up.   Specialty: Behavioral Health Why: You have an appointment for therapy services 02/16/21 at 7:45am. This appointment will be held in-person.  Contact information: [address] [place] [address] [phone]       Milagros Evener, MD Follow up.   Specialty: Psychiatry Why: You have an appointment for medication management services on 02/17/21 at 11am. This will be a virtual appointment. Please contact this provider before your appointment to obtain your link for the appointment.   Contact information: [address] [place] [address] [phone]               Next level of care provider has access to [facility] Link:no  Safety Planning and Suicide Prevention discussed: Yes,  with sister     Has patient been referred to the Quitline?: N/A patient is not a smoker  Patient has been referred for addiction treatment: Yes  Lynnell Chad, LCSW 02/15/2021, 11:14 AM

## 2021-02-15 NOTE — [facility] Suicide Risk Assessment (Signed)
[facility] Discharge Suicide Risk Assessment   Principal Problem: MDD (major depressive disorder) Discharge Diagnoses: Principal Problem:   MDD (major depressive disorder) Active Problems:   Moderate cocaine use disorder (HCC)   Total Time spent with patient: 20 minutes  Musculoskeletal: Strength & Muscle Tone: decreased Gait & Station: shuffle Patient leans: N/A  Psychiatric Specialty Exam  Presentation  General Appearance: Fairly Groomed  Eye Contact:Fair  Speech:Normal Rate  Speech Volume:Normal  Handedness:Right   Mood and Affect  Mood:Dysphoric  Duration of Depression Symptoms: Less than two weeks  Affect:Congruent   Thought Process  Thought Processes:Goal Directed  Descriptions of Associations:Circumstantial  Orientation:Full (Time, Place and Person)  Thought Content:Logical  History of Schizophrenia/Schizoaffective disorder:No  Duration of Psychotic Symptoms:No data recorded Hallucinations:Hallucinations: None  Ideas of Reference:None  Suicidal Thoughts:Suicidal Thoughts: No  Homicidal Thoughts:Homicidal Thoughts: No   Sensorium  Memory:Immediate Fair; Recent Fair; Remote Fair  Judgment:Impaired  Insight:Lacking   Executive Functions  Concentration:Good  Attention Span:Good  Recall:Good  Fund of Knowledge:Good  Language:Good   Psychomotor Activity  Psychomotor Activity:Psychomotor Activity: Normal   Assets  Assets:Desire for Improvement; Resilience   Sleep  Sleep:Sleep: Good   Physical Exam: Physical Exam Vitals and nursing note reviewed.  HENT:     Head: Normocephalic and atraumatic.  Pulmonary:     Effort: Pulmonary effort is normal.  Musculoskeletal:        General: Tenderness present.  Neurological:     General: No focal deficit present.     Mental Status: He is alert and oriented to person, place, and time.    Review of Systems  Musculoskeletal: Positive for back pain, joint pain and myalgias.  All other  systems reviewed and are negative.  Blood pressure 98/66, pulse 68, temperature 98 F (36.7 C), temperature source Oral, resp. rate 18, height 6\' 3"  (1.905 m), weight 90.7 kg, SpO2 94 %. Body mass index is 25 kg/m.  Mental Status Per Nursing Assessment::   On Admission:  Suicidal ideation indicated by patient,Suicide plan,Self-harm thoughts  Demographic Factors:  Male, Caucasian, Low socioeconomic status, Living alone and Unemployed  Loss Factors: Financial problems/change in socioeconomic status  Historical Factors: Impulsivity  Risk Reduction Factors:   NA  Continued Clinical Symptoms:  Depression:   Impulsivity Alcohol/Substance Abuse/Dependencies  Cognitive Features That Contribute To Risk:  Thought constriction (tunnel vision)    Suicide Risk:  Minimal: No identifiable suicidal ideation.  Patients presenting with no risk factors but with morbid ruminations; may be classified as minimal risk based on the severity of the depressive symptoms   Follow-up Information    [facility] Follow up.   Specialty: Behavioral Health Why: You have an appointment for therapy services 02/16/21 at 7:45am. This appointment will be held in-person.  Contact information: [address] [place] [phone]       [phone], MD Follow up.   Specialty: Psychiatry Why: You have an appointment for medication management services on 02/17/21 at 11am. This will be a virtual appointment. Please contact this provider before your appointment to obtain your link for the appointment.   Contact information: [address] [place] [phone]               Plan Of Care/Follow-up recommendations:  Activity:  ad lib Other:  we will have to transported to ED to examine you back after discharge [facility]. Do not use cocaine or other substances.  [facility], MD 02/15/2021, 9:05 AM

## 2021-02-15 NOTE — [facility] Group Notes (Signed)
Patient did not attend the goals group. 

## 2021-02-15 NOTE — Progress Notes (Signed)
[place] NOVEL CORONAVIRUS (COVID-19) DAILY CHECK-OFF SYMPTOMS - answer yes or no to each - every day NO YES  Have you had a fever in the past 24 hours?  . Fever (Temp > 37.80C / 100F) X   Have you had any of these symptoms in the past 24 hours? . New Cough .  Sore Throat  .  Shortness of Breath .  Difficulty Breathing .  Unexplained Body Aches   X   Have you had any one of these symptoms in the past 24 hours not related to allergies?   . Runny Nose .  Nasal Congestion .  Sneezing   X   If you have had runny nose, nasal congestion, sneezing in the past 24 hours, has it worsened?  X   EXPOSURES - check yes or no X   Have you traveled outside the state in the past 14 days?  X   Have you been in contact with someone with a confirmed diagnosis of COVID-19 or PUI in the past 14 days without wearing appropriate PPE?  X   Have you been living in the same home as a person with confirmed diagnosis of COVID-19 or a PUI (household contact)?    X   Have you been diagnosed with COVID-19?    X              What to do next: Answered NO to all: Answered YES to anything:   Proceed with unit schedule Follow the [facility] Inpatient Flowsheet.   

## 2021-02-15 NOTE — Progress Notes (Signed)
Pt discharged to lobby with bus ticket. Pt was stable and appreciative at that time. All papers, samples, and prescriptions were given and valuables returned. Verbal understanding expressed. Denies SI/HI and A/VH. Pt given opportunity to express concerns and ask questions.

## 2021-02-15 NOTE — Progress Notes (Signed)
   02/15/21 1000  Psych Admission Type (Psych Patients Only)  Admission Status Voluntary  Psychosocial Assessment  Patient Complaints Anxiety  Eye Contact Brief  Facial Expression Flat  Affect Appropriate to circumstance  Speech Logical/coherent  Interaction Assertive  Motor Activity Slow  Appearance/Hygiene Unremarkable  Behavior Characteristics Cooperative;Appropriate to situation  Mood Depressed;Anxious  Thought Process  Coherency WDL  Content WDL  Delusions None reported or observed  Perception WDL  Hallucination None reported or observed  Judgment Poor  Confusion None  Danger to Self  Current suicidal ideation? Denies  Self-Injurious Behavior No self-injurious ideation or behavior indicators observed or expressed   Agreement Not to Harm Self Yes  Description of Agreement verbal  Danger to Others  Danger to Others None reported or observed

## 2021-02-15 NOTE — Discharge Summary (Addendum)
Physician Discharge Summary Note  Patient:  Christopher Giles is an 55 y.o., male MRN:  [id number] DOB:  [birth year] Patient phone:  [phone] (home)  Patient address:   [address] [place] [address],  Total Time spent with patient: Greater than 30 minutes  Date of Admission:  02/08/2021 Date of Discharge: 02-15-21  Reason for Admission: Worsening depressive symptoms and suicidal ideation with a plan to jump off the bridge in the context of multiple psychosocial stressors and relapse on cocaine.  Principal Problem: MDD (major depressive disorder) Discharge Diagnoses: Principal Problem:   MDD (major depressive disorder) Active Problems:   Moderate cocaine use disorder [facility])  Past Psychiatric History: Major depressive disorder.  Past Medical History:  Past Medical History:  Diagnosis Date  . Anxiety   . Depression   . Moderate cocaine use disorder (HCC) 02/09/2021  . Substance abuse (HCC)   . Suicide attempt (HCC)    x 6 per pt  . Thyroid disease    hypo    Past Surgical History:  Procedure Laterality Date  . punctured lung     Family History:  Family History  Problem Relation Age of Onset  . Hyperlipidemia Mother   . Cancer Mother   . Cancer Father   . Diabetes Maternal Grandmother    Family Psychiatric  History: See H&P  Social History:  Social History   Substance and Sexual Activity  Alcohol Use No     Social History   Substance and Sexual Activity  Drug Use No    Social History   Socioeconomic History  . Marital status: Single    Spouse name: Not on file  . Number of children: Not on file  . Years of education: 56  . Highest education level: High school graduate  Occupational History  . Not on file  Tobacco Use  . Smoking status: Current Every Day Smoker    Packs/day: 1.00    Types: Cigarettes  . Smokeless tobacco: Never Used  Substance and Sexual Activity  . Alcohol use: No  . Drug use: No  . Sexual activity: Never     Birth control/protection: Abstinence  Other Topics Concern  . Not on file  Social History Narrative  . Not on file   Social Determinants of Health   Financial Resource Strain: Not on file  Food Insecurity: Not on file  Transportation Needs: Not on file  Physical Activity: Not on file  Stress: Not on file  Social Connections: Not on file   Hospital Course: (Per Md's admission evaluation notes): Christopher Giles a 55 year old male a history of depression, anxiety, hypothyroidism and cocaine use disorder who presented to [facility], ED with worsening depressive symptoms and suicidal ideation with a plan to jump off the bridge in the context of multiple psychosocial stressors and relapse on cocaine. In the ED patient reported he had not been taking his medications for 2 weeks prior to EDpresentation. On evaluation in the ED, the patient reported suicidal ideation with multiple potential plans including jumping off a bridge or running into traffic. He reported to home medication regimen of Zoloft 200 mg at bedtime, Abilify 5 mg at bedtime, Depakote ER 1000 mg at bedtime and Xanax 1 mg 3 times daily PRN.The patient was transferred to [facility] further treatment of worsening depressive symptoms and suicidal ideation. On interview with me the patient reports that over the past few months he has had a lot of stressors. He has experienced gastritis which caused  him to miss a lot of work, a friend died suddenly and power was cut off. The patient states that he experienced worsening symptoms of depression and additionally he had a relapse on cocaine over the past week. He has experienced symptoms of depressed mood, hypersomnia, decreased appetite, decreased interest, problems concentrating, low energy, difficulty showering, hopelessness, racing thoughts and suicidal ideation. Patient states suicidal ideations started to occur 3 to 4 days ago and he had thoughts of jumping off a bridge. Currently he  continues with passive wishes not to be alive but denies any active suicidal ideation in the hospital. He denies AI, HI, AH, VH or PI. Patient reports that he recently has been smoking cocaine 1/2 g/day and using marijuana a few times per week. He denies other drug use or alcohol use. Patient states that he does not consistently take Xanax 1 mg 3 times a day but mostly takes 1 mg twice a day. PDMP indicates a prescription for Xanax 1 mg number 90 tablets for 30 days was filled on 01/12/2021.  Prior to this discharge, Christopher Giles was seen & evaluated for mental health stability. The current laboratory findings were reviewed (stable), nurses notes & vital signs were reviewed as well. There are no current mental health or medical issues that should prevent this discharge at this time. Patient is being discharged to continue mental health care as noted below.  This is the third psychiatric admission/discharge summaries from this [facility] for this 55 year old male with hx of chronic mental illness, polysubstance use disorders & multiple psychiatric admissions. He is known in this [facility] for worsening symptoms of depression/substance use issues. He has been treated multiple times in the hospital for his symptoms & it appeared his symptoms has not been able to improve & yet Christopher Giles reported on this present admission that there were other psychosocial stress that led to his worsening symptoms & suicidal ideations with plans. He was brought to the [facility]  for evaluation & treatment.  After evaluation of his presenting symptoms, Christopher Giles was recommended for mood stabilization treatments. The medication regimen for his presenting symptoms were discussed & with his consent initiated. He received, stabilized & was discharged on the medications as listed below on his discharge medication lists. He was also enrolled & participated in the group counseling sessions being offered & held on this unit. He learned coping skills. He presented on  this admission, other pre-existing medical conditions that required treatment & monitoring. He was treated & discharged on the medications for those health issues. He tolerated his treatment regimen without any adverse effects or reactions reported.  During the course of this hospitalization, the 15-minute checks were adequate to ensure Christopher Giles's safety.  Patient did not display any dangerous, violent or suicidal behavior on the unit. He interacted with patients & staff appropriately. He participated appropriately in the group sessions/therapies. His medications were addressed & adjusted to meet his needs. He was recommended for outpatient follow-up care & medication management upon discharge to assure his continuity of care.  At the time of discharge, patient is not reporting any acute suicidal/homicidal ideations. He currently denies any new issues or concerns. Education and supportive counseling including resources to assist him with his needs outside the hospital provided upon discharge.  Today upon his discharge evaluation with the attending psychiatrist, Christopher Giles presents mentally & medically stable. He denies any other specific concerns. He is sleeping well. His appetite is good. He denies other physical complaints. He denies AH/VH, delusional thoughts or paranoia.  He feels that his medications have been helpful & is in agreement to continue his current treatment regimen as recommended. He was able to engage in safety planning [facility] or contact emergency services if he feels unable to maintain his own safety or the safety of others. Pt had no further questions, comments, or concerns. [facility] personal belongings in no apparent distress. Transportation per the city bus. [facility] assisted him with bus passes.  Physical Findings: AIMS: Facial and Oral Movements Muscles of Facial Expression: None, normal Lips and Perioral Area: None, normal Jaw: None, normal Tongue: None,  normal,Extremity Movements Upper (arms, wrists, hands, fingers): None, normal Lower (legs, knees, ankles, toes): None, normal, Trunk Movements Neck, shoulders, hips: None, normal, Overall Severity Severity of abnormal movements (highest score from questions above): None, normal Incapacitation due to abnormal movements: None, normal Patient's awareness of abnormal movements (rate only patient's report): No Awareness, Dental Status Current problems with teeth and/or dentures?: No Does patient usually wear dentures?: No  CIWA:  CIWA-Ar Total: 1 COWS:     Musculoskeletal: Strength & Muscle Tone: within normal limits Gait & Station: normal Patient leans: N/A  Psychiatric Specialty Exam:  Presentation  General Appearance: Appropriate for Environment; Fairly Groomed  Eye Contact:Good  Speech:Normal Rate  Speech Volume:Normal  Handedness:Right   Mood and Affect  Mood:Euthymic  Affect:Appropriate; Congruent   Thought Process  Thought Processes:Coherent; Goal Directed; Linear  Descriptions of Associations:Intact  Orientation:Full (Time, Place and Person)  Thought Content:Logical  History of Schizophrenia/Schizoaffective disorder:No  Duration of Psychotic Symptoms:No data recorded Hallucinations:Hallucinations: None  Ideas of Reference:None  Suicidal Thoughts:Suicidal Thoughts: No SI Active Intent and/or Plan: Without Intent; Without Plan; Without Means to Carry Out; Without Access to Means SI Passive Intent and/or Plan: Without Intent; Without Means to Carry Out; Without Access to Means  Homicidal Thoughts:Homicidal Thoughts: No   Sensorium  Memory:Immediate Good; Recent Good; Remote Good  Judgment:Good  Insight:Good   Executive Functions  Concentration:Good  Attention Span:Good  Recall:Good  Fund of Knowledge:Good  Language:Good  Psychomotor Activity  Psychomotor Activity:Psychomotor Activity: Normal  Assets  Assets:Communication Skills;  Desire for Improvement; Resilience; Housing  Sleep  Sleep:Sleep: Good Number of Hours of Sleep: 7.25  Physical Exam: Physical Exam Vitals and nursing note reviewed.  HENT:     Head: Normocephalic.     Nose: Nose normal.     Mouth/Throat:     Pharynx: Oropharynx is clear.  Eyes:     Pupils: Pupils are equal, round, and reactive to light.  Cardiovascular:     Rate and Rhythm: Normal rate.     Pulses: Normal pulses.  Pulmonary:     Effort: Pulmonary effort is normal.  Genitourinary:    Comments: Deferred Musculoskeletal:        General: Normal range of motion.     Cervical back: Normal range of motion.  Skin:    General: Skin is warm and dry.  Neurological:     General: No focal deficit present.     Mental Status: He is alert and oriented to person, place, and time. Mental status is at baseline.    Review of Systems  Constitutional: Negative.   HENT: Negative.   Eyes: Negative.   Respiratory: Negative.   Cardiovascular: Negative.   Gastrointestinal: Negative.   Genitourinary: Negative.   Musculoskeletal: Negative.   Skin: Negative.   Neurological: Negative.   Endo/Heme/Allergies: Negative.   Psychiatric/Behavioral: Positive for depression (hx. of (stable on medication)). Negative  for hallucinations, memory loss, substance abuse and suicidal ideas. The patient has insomnia (Hx of (stable on medication)). The patient is not nervous/anxious (Stable on medication).    Blood pressure 98/66, pulse 68, temperature 98 F (36.7 C), temperature source Oral, resp. rate 18, height 6\' 3"  (1.905 m), weight 90.7 kg, SpO2 94 %. Body mass index is 25 kg/m.  Has this patient used any form of tobacco in the last 30 days? (Cigarettes, Smokeless Tobacco, Cigars, and/or Pipes): Yes, an FDA-approved tobacco cessation medication was recommended at discharge.  Blood Alcohol level:  Lab Results  Component Value Date   ETH <10 02/07/2021   ETH <5 11/17/2016   Metabolic Disorder Labs:   Lab Results  Component Value Date   HGBA1C 5.6 02/10/2021   MPG 114 02/10/2021   No results found for: PROLACTIN Lab Results  Component Value Date   CHOL 163 02/10/2021   TRIG 409 (H) 02/10/2021   HDL 34 (L) 02/10/2021   CHOLHDL 4.8 02/10/2021   VLDL UNABLE TO CALCULATE IF TRIGLYCERIDE OVER 400 mg/dL 02/12/2021   LDLCALC UNABLE TO CALCULATE IF TRIGLYCERIDE OVER 400 mg/dL 30/16/0109   See Psychiatric Specialty Exam and Suicide Risk Assessment completed by Attending Physician prior to discharge.  Discharge destination:  Home  Is patient on multiple antipsychotic therapies at discharge:  No   Has Patient had three or more failed trials of antipsychotic monotherapy by history:  No  Recommended Plan for Multiple Antipsychotic Therapies: NA  Allergies as of 02/15/2021   No Known Allergies     Medication List    STOP taking these medications   atorvastatin 40 MG tablet Commonly known as: LIPITOR   divalproex 500 MG 24 hr tablet Commonly known as: DEPAKOTE ER   Rapaflo 8 MG Caps capsule Generic drug: silodosin     TAKE these medications     Indication  ALPRAZolam 0.5 MG tablet Commonly known as: XANAX Take 1 tablet (0.5 mg total) by mouth 2 (two) times daily. For anxiety  Indication: Feeling Anxious   ARIPiprazole 5 MG tablet Commonly known as: ABILIFY Take 1 tablet (5 mg total) by mouth daily. For mood control  Indication: Mood control   docusate sodium 100 MG capsule Commonly known as: COLACE Take 1 capsule (100 mg total) by mouth daily. (May buy from over the counter): For constipation Start taking on: February 16, 2021  Indication: Constipation   famotidine 20 MG tablet Commonly known as: PEPCID Take 1 tablet (20 mg total) by mouth at bedtime. For acid reflux  Indication: Gastroesophageal Reflux Disease   hydrOXYzine 25 MG tablet Commonly known as: ATARAX/VISTARIL Take 1 tablet (25 mg total) by mouth 2 (two) times daily as needed for anxiety.  Indication:  Feeling Anxious   levothyroxine 25 MCG tablet Commonly known as: SYNTHROID Take 1 tablet (25 mcg total) by mouth daily at 6 (six) AM. For low functioning thyroid gland Start taking on: February 16, 2021  Indication: Underactive Thyroid   multivitamin with minerals Tabs tablet Take 1 tablet by mouth daily. (May buy from over the counter): Nutritional supplement Start taking on: February 16, 2021  Indication: Nutritional supplement   naproxen sodium 220 MG tablet Commonly known as: ALEVE Take 220-440 mg by mouth 2 (two) times daily as needed (for pain or headaches).  Indication: Pain   nicotine 21 mg/24hr patch Commonly known as: NICODERM CQ - dosed in mg/24 hours Place 1 patch (21 mg total) onto the skin daily. (May buy from over the counter):  For smoking cessation Start taking on: February 16, 2021  Indication: Nicotine Addiction   OXcarbazepine 150 MG tablet Commonly known as: TRILEPTAL Take 1 tablet (150 mg total) by mouth 2 (two) times daily. For mood stabilization  Indication: Mood stabilization   polyethylene glycol 17 g packet Commonly known as: MIRALAX / GLYCOLAX Take 17 g by mouth daily. (May buy from over the counter): For constipation Start taking on: February 16, 2021  Indication: Constipation   sertraline 50 MG tablet Commonly known as: ZOLOFT Take 3 tablets (150 mg total) by mouth daily. For depression Start taking on: February 16, 2021  Indication: Major Depressive Disorder   tamsulosin 0.4 MG Caps capsule Commonly known as: FLOMAX Take 1 capsule (0.4 mg total) by mouth daily. For Prostate health Start taking on: February 16, 2021  Indication: Benign Enlargement of Prostate   traZODone 50 MG tablet Commonly known as: DESYREL Take 1 tablet (50 mg total) by mouth at bedtime as needed for sleep.  Indication: Trouble Sleeping       Follow-up Information    [facility] Follow up.   Specialty: Behavioral Health Why: You have an appointment for therapy  services 02/16/21 at 7:45am. This appointment will be held in-person.  Contact information: [address] [place] [address] [phone]       Milagros Evener, MD Follow up.   Specialty: Psychiatry Why: You have an appointment for medication management services on 02/17/21 at 11am. This will be a virtual appointment. Please contact this provider before your appointment to obtain your link for the appointment.   Contact information: [address] [place] [address] [phone]              Follow-up recommendations: Activity:  As tolerated Diet: As recommended by your primary care doctor. Keep all scheduled follow-up appointments as recommended.   Comments: Prescriptions given at discharge.  Patient agreeable to plan.  Given opportunity to ask questions.  Appears to feel comfortable with discharge denies any current suicidal or homicidal thought. Patient is also instructed prior to discharge to: Take all medications as prescribed by his/her mental healthcare provider. Report any adverse effects and or reactions from the medicines to his/her outpatient provider promptly. Patient has been instructed & cautioned: To not engage in alcohol and or illegal drug use while on prescription medicines. In the event of worsening symptoms, patient is instructed to call the crisis hotline, 911 and or go to the nearest ED for appropriate evaluation and treatment of symptoms. To follow-up with his/her primary care provider for your other medical issues, concerns and or health care needs.  Signed: Armandina Stammer, NP, PMHNP, [name]-BC 02/15/2021, 4:27 PM

## 2021-02-24 ENCOUNTER — Emergency Department (HOSPITAL_COMMUNITY)
Admission: EM | Admit: 2021-02-24 | Discharge: 2021-02-25 | Disposition: A | Discharge status: Other Acute Inpatient Hospital | Payer: Self-pay | Attending: Emergency Medicine | Admitting: Emergency Medicine

## 2021-02-24 ENCOUNTER — Other Ambulatory Visit: Payer: Self-pay

## 2021-02-24 ENCOUNTER — Encounter (HOSPITAL_COMMUNITY): Payer: Self-pay | Admitting: Student

## 2021-02-24 DIAGNOSIS — T50902A Poisoning by unspecified drugs, medicaments and biological substances, intentional self-harm, initial encounter: Secondary | ICD-10-CM

## 2021-02-24 DIAGNOSIS — T424X2A Poisoning by benzodiazepines, intentional self-harm, initial encounter: Secondary | ICD-10-CM | POA: Insufficient documentation

## 2021-02-24 DIAGNOSIS — F332 Major depressive disorder, recurrent severe without psychotic features: Secondary | ICD-10-CM

## 2021-02-24 DIAGNOSIS — Z79899 Other long term (current) drug therapy: Secondary | ICD-10-CM | POA: Insufficient documentation

## 2021-02-24 DIAGNOSIS — Z20822 Contact with and (suspected) exposure to covid-19: Secondary | ICD-10-CM | POA: Insufficient documentation

## 2021-02-24 DIAGNOSIS — X838XXA Intentional self-harm by other specified means, initial encounter: Secondary | ICD-10-CM | POA: Insufficient documentation

## 2021-02-24 DIAGNOSIS — F1420 Cocaine dependence, uncomplicated: Secondary | ICD-10-CM

## 2021-02-24 DIAGNOSIS — Y900 Blood alcohol level of less than 20 mg/100 ml: Secondary | ICD-10-CM | POA: Insufficient documentation

## 2021-02-24 LAB — COMPREHENSIVE METABOLIC PANEL
ALT: 16 U/L (ref 0–44)
AST: 23 U/L (ref 15–41)
Albumin: 4.2 g/dL (ref 3.5–5.0)
Alkaline Phosphatase: 74 U/L (ref 38–126)
Anion gap: 9 (ref 5–15)
BUN: 13 mg/dL (ref 6–20)
CO2: 26 mmol/L (ref 22–32)
Calcium: 9.2 mg/dL (ref 8.9–10.3)
Chloride: 103 mmol/L (ref 98–111)
Creatinine, Ser: 0.95 mg/dL (ref 0.61–1.24)
GFR, Estimated: >60 mL/min (ref >60)
Glucose, Bld: 92 mg/dL (ref 70–99)
Potassium: 3.7 mmol/L (ref 3.5–5.1)
Sodium: 138 mmol/L (ref 135–145)
Total Bilirubin: 0.5 mg/dL (ref 0.3–1.2)
Total Protein: 7.3 g/dL (ref 6.5–8.1)

## 2021-02-24 LAB — CBC WITH DIFFERENTIAL/PLATELET
Abs Immature Granulocytes: 0.01 10*3/uL (ref 0.00–0.07)
Basophils Absolute: 0 10*3/uL (ref 0.0–0.1)
Basophils Relative: 1 %
Eosinophils Absolute: 0.1 10*3/uL (ref 0.0–0.5)
Eosinophils Relative: 1 %
HCT: 40.7 % (ref 39.0–52.0)
Hemoglobin: 13.9 g/dL (ref 13.0–17.0)
Immature Granulocytes: 0 %
Lymphocytes Relative: 20 %
Lymphs Abs: 1.6 10*3/uL (ref 0.7–4.0)
MCH: 31.3 pg (ref 26.0–34.0)
MCHC: 34.2 g/dL (ref 30.0–36.0)
MCV: 91.7 fL (ref 80.0–100.0)
Monocytes Absolute: 0.5 10*3/uL (ref 0.1–1.0)
Monocytes Relative: 6 %
Neutro Abs: 5.6 10*3/uL (ref 1.7–7.7)
Neutrophils Relative %: 72 %
Platelets: 258 10*3/uL (ref 150–400)
RBC: 4.44 MIL/uL (ref 4.22–5.81)
RDW: 12.3 % (ref 11.5–15.5)
WBC: 7.8 10*3/uL (ref 4.0–10.5)
nRBC: 0 % (ref 0.0–0.2)

## 2021-02-24 LAB — ACETAMINOPHEN LEVEL: Acetaminophen (Tylenol), Serum: <10 ug/mL — ABNORMAL LOW (ref 10–30)

## 2021-02-24 LAB — RESP PANEL BY RT-PCR (FLU A&B, COVID) ARPGX2
Influenza A by PCR: NEGATIVE
Influenza B by PCR: NEGATIVE
SARS Coronavirus 2 by RT PCR: NEGATIVE

## 2021-02-24 LAB — ETHANOL: Alcohol, Ethyl (B): <10 mg/dL (ref <10)

## 2021-02-24 LAB — SALICYLATE LEVEL: Salicylate Lvl: <7 mg/dL — ABNORMAL LOW (ref 7.0–30.0)

## 2021-02-24 LAB — MAGNESIUM: Magnesium: 2.2 mg/dL (ref 1.7–2.4)

## 2021-02-24 NOTE — ED Triage Notes (Signed)
Patient BIB [facility] for SI.  Patient called EMS said he took 20-30 xanax last night, when it didn't do anything he took 30-40 this morning around 1100. EMS brought all the meds from patient home and xanax 1mg   TID is in the bag, which is a 30 days RX that was filled on 02/17/21, there are 25 tablets in the bottle.  One of the neighbors said patient recently lost a friend and has been depressed.  He was hospitalized recently for depression.  Patient also has no power at the home and no food.  Vitals were 108/72 86-HR 94% on room air CBG 94 97.7-temp

## 2021-02-24 NOTE — ED Provider Notes (Addendum)
[facility]-EMERGENCY DEPT Provider Note   CSN: [id number] Arrival date & time: 02/24/21  1540     History Chief Complaint  Patient presents with   Suicidal    Christopher Giles is a 55 y.o. male with past medical history significant for MDD with SI and polysubstance abuse recently discharged from behavioral health hospital on 02/15/2021 after a 1 week admission for worsening depressive symptoms and SI.  I reviewed his medical record and he had a specific plan to jump off of a bridge.  He had relapsed recently on cocaine and had been having worsening depression symptoms.  At time of discharge, per note from [facility], patient has been feeling improved and felt comfortable going home.  Patient had #90 alprazolam 1 mg filled 02/17/2021.  Per EMS, they counted only 25 remaining pills in the bottle.    On my examination, patient reports that he took 20 of his Xanax last evening and then 30 additional Xanax this morning in an attempt to kill himself.  He states that he is in the process of being evicted from his home for failure to make payments.  He states that he still has a job, but has been on medical leave due to gastritis.  He states that he has been having worsening depression symptoms and denies having felt improved at time of discharge from recent psychiatric hospitalization.  He believes he needs to have his medications adjusted.  He is answering questions appropriately.  He is relatively slow to respond, but is alert and oriented x3.  Denies any alcohol use or other illicit drug use.  He has not had any relapses on cocaine since most recent discharge from psychiatric hospital.  He denies any other medical complaints.  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   Moderate episode of recurrent major depressive disorder (HCC)    Severe episode of recurrent major depressive disorder, without psychotic features (HCC)     Past Surgical History:  Procedure Laterality Date   punctured lung         Family History  Problem Relation Age of Onset   Hyperlipidemia Mother    Cancer Mother    Cancer Father    Diabetes Maternal Grandmother     Social History   Tobacco Use   Smoking status: Every Day    Packs/day: 1.00    Pack years: 0.00    Types: Cigarettes   Smokeless tobacco: Never  Substance Use Topics   Alcohol use: No   Drug use: No    Home Medications Prior to Admission medications   Medication Sig Start Date End Date Taking? Authorizing Provider  ALPRAZolam Prudy Feeler) 0.5 MG tablet Take 1 tablet (0.5 mg total) by mouth 2 (two) times daily. For anxiety 02/15/21   Armandina Stammer I, NP  ARIPiprazole (ABILIFY) 5 MG tablet Take 1 tablet (5 mg total) by mouth daily. For mood control 02/15/21   Armandina Stammer I, NP  docusate sodium (COLACE) 100 MG capsule Take  1 capsule (100 mg total) by mouth daily. (May buy from over the counter): For constipation 02/16/21   Armandina Stammer I, NP  famotidine (PEPCID) 20 MG tablet Take 1 tablet (20 mg total) by mouth at bedtime. For acid reflux 02/15/21   Armandina Stammer I, NP  hydrOXYzine (ATARAX/VISTARIL) 25 MG tablet Take 1 tablet (25 mg total) by mouth 2 (two) times daily as needed for anxiety. 02/15/21   Armandina Stammer I, NP  levothyroxine (SYNTHROID) 25 MCG tablet Take 1 tablet (25 mcg total) by mouth daily at 6 (six) AM. For low functioning thyroid gland 02/16/21   Armandina Stammer I, NP  Multiple Vitamin (MULTIVITAMIN WITH MINERALS) TABS tablet Take 1 tablet by mouth daily. (May buy from over the counter): Nutritional supplement  02/16/21   Armandina Stammer I, NP  naproxen sodium (ALEVE) 220 MG tablet Take 220-440 mg by mouth 2 (two) times daily as needed (for pain or headaches).    [provider]  nicotine (NICODERM CQ - DOSED IN MG/24 HOURS) 21 mg/24hr patch Place 1 patch (21 mg total) onto the skin daily. (May buy from over the counter): For smoking cessation 02/16/21   Armandina Stammer I, NP  OXcarbazepine (TRILEPTAL) 150 MG tablet Take 1 tablet (150 mg total) by mouth 2 (two) times daily. For mood stabilization 02/15/21   Nwoko, Nicole Kindred I, NP  polyethylene glycol (MIRALAX / GLYCOLAX) 17 g packet Take 17 g by mouth daily. (May buy from over the counter): For constipation 02/16/21   Armandina Stammer I, NP  sertraline (ZOLOFT) 50 MG tablet Take 3 tablets (150 mg total) by mouth daily. For depression 02/16/21   Armandina Stammer I, NP  tamsulosin (FLOMAX) 0.4 MG CAPS capsule Take 1 capsule (0.4 mg total) by mouth daily. For Prostate health 02/16/21   Armandina Stammer I, NP  traZODone (DESYREL) 50 MG tablet Take 1 tablet (50 mg total) by mouth at bedtime as needed for sleep. 02/15/21   Armandina Stammer I, NP  clonazePAM (KLONOPIN) 0.5 MG tablet Take 1 tablet (0.5 mg total) by mouth 2 (two) times daily. Patient not taking: Reported on 02/07/2021 07/28/16 02/07/21  Beau Fanny, [name]  pantoprazole (PROTONIX) 20 MG tablet Take 1 tablet (20 mg total) by mouth 2 (two) times daily. Patient not taking: No sig reported 11/11/20 02/07/21  Lorre Nick, MD  sucralfate (CARAFATE) 1 g tablet Take 1 tablet (1 g total) by mouth 4 (four) times daily. Patient not taking: Reported on 02/07/2021 11/11/20 02/07/21  Lorre Nick, MD  terazosin (HYTRIN) 1 MG capsule Take 1 mg by mouth at bedtime. Patient not taking: No sig reported  02/07/21  [provider]    Allergies    Patient has no known allergies.  Review of Systems   Review of Systems  All other systems reviewed and are negative.  Physical Exam Updated Vital Signs BP 108/75   Pulse 66   Temp 98.1 F  (36.7 C) (Oral)   Resp (!) 26   Ht 6\' 3"  (1.905 m)   Wt 91 kg   SpO2 97%   BMI 25.08 kg/m   Physical Exam Vitals and nursing note reviewed. Exam conducted with a chaperone present.  Constitutional:      General: He is not in acute distress.    Appearance: Normal appearance.  HENT:     Head: Normocephalic and atraumatic.  Eyes:     General: No scleral icterus.    Conjunctiva/sclera: Conjunctivae normal.  Cardiovascular:     Rate  and Rhythm: Normal rate.     Pulses: Normal pulses.  Pulmonary:     Effort: Pulmonary effort is normal. No respiratory distress.  Musculoskeletal:        General: Normal range of motion.  Skin:    General: Skin is dry.  Neurological:     General: No focal deficit present.     Mental Status: He is alert and oriented to person, place, and time.     GCS: GCS eye subscore is 4. GCS verbal subscore is 5. GCS motor subscore is 6.  Psychiatric:        Mood and Affect: Mood normal.        Behavior: Behavior normal.        Thought Content: Thought content normal.    ED Results / Procedures / Treatments   Labs (all labs ordered are listed, but only abnormal results are displayed) Labs Reviewed  ACETAMINOPHEN LEVEL - Abnormal; Notable for the following components:      Result Value   Acetaminophen (Tylenol), Serum <10 (*)    All other components within normal limits  SALICYLATE LEVEL - Abnormal; Notable for the following components:   Salicylate Lvl <7.0 (*)    All other components within normal limits  RESP PANEL BY RT-PCR (FLU A&B, COVID) ARPGX2  COMPREHENSIVE METABOLIC PANEL  ETHANOL  CBC WITH DIFFERENTIAL/PLATELET  MAGNESIUM  RAPID URINE DRUG SCREEN, HOSP PERFORMED    EKG None  Radiology No results found.  Procedures Procedures   Medications Ordered in ED Medications - No data to display  ED Course  I have reviewed the triage vital signs and the nursing notes.  Pertinent labs & imaging results that were available during my care  of the patient were reviewed by me and considered in my medical decision making (see chart for details).    MDM Rules/Calculators/A&P                          CLOVIS MANKINS was evaluated in Emergency Department on 02/24/2021 for the symptoms described in the history of present illness. He was evaluated in the context of the global COVID-19 pandemic, which necessitated consideration that the patient might be at risk for infection with the SARS-CoV-2 virus that causes COVID-19. Institutional protocols and algorithms that pertain to the evaluation of patients at risk for COVID-19 are in a state of rapid change based on information released by regulatory bodies including the CDC and federal and state organizations. These policies and algorithms were followed during the patient's care in the ED.  I personally reviewed patient's medical chart and all notes from triage and staff during today's encounter. I have also ordered and reviewed all labs and imaging that I felt to be medically necessary in the evaluation of this patient's complaints and with consideration of their physical exam. If needed, translation services were available and utilized.   I spoke with Dorene Grebe with poison control who states that it is largely supportive care.  We will obtain EKG, Tylenol level, aspirin level, basic laboratory work-up, and continue to reassess patient.  Fortunately he is already 4 hours s/p ingestion which is the window for concern.  If laboratory work-up and EKG is reassuring, he is medically cleared for psychiatric evaluation.  Plan is to only initiate flumazenil if there is concern that he will need intubation in setting of worsening respiratory status.   EKG is reviewed and without any QTC prolongation or widening QRS.  Patient  is medically cleared.  He is resting comfortably.  He is voluntary and has no intention of leaving.  We will consult TTS for admission.  Final Clinical Impression(s) / ED Diagnoses Final  diagnoses:  Intentional drug overdose, initial encounter [facility](HCC)    Rx / DC Orders ED Discharge Orders     None        Elvera MariaGreen, Laurene Melendrez L, PA-C 02/24/21 1829    Lorre NickAllen, [name], MD 02/27/21 1521    Lorelee NewGreen, Aricela Bertagnolli L, PA-C 03/09/21 1627    Lorre NickAllen, [name], MD 03/10/21 1056

## 2021-02-25 ENCOUNTER — Ambulatory Visit (HOSPITAL_COMMUNITY)
Admission: EM | Admit: 2021-02-25 | Discharge: 2021-02-26 | Disposition: A | Discharge status: Home / Self Care | Payer: No Payment, Other | Attending: Family | Admitting: Family

## 2021-02-25 DIAGNOSIS — T50902A Poisoning by unspecified drugs, medicaments and biological substances, intentional self-harm, initial encounter: Secondary | ICD-10-CM

## 2021-02-25 DIAGNOSIS — F332 Major depressive disorder, recurrent severe without psychotic features: Secondary | ICD-10-CM | POA: Insufficient documentation

## 2021-02-25 DIAGNOSIS — F17210 Nicotine dependence, cigarettes, uncomplicated: Secondary | ICD-10-CM | POA: Insufficient documentation

## 2021-02-25 LAB — RAPID URINE DRUG SCREEN, HOSP PERFORMED
Amphetamines: NOT DETECTED
Barbiturates: NOT DETECTED
Benzodiazepines: POSITIVE — AB
Cocaine: NOT DETECTED
Opiates: NOT DETECTED
Tetrahydrocannabinol: POSITIVE — AB

## 2021-02-25 MED ORDER — ALPRAZOLAM 0.5 MG PO TABS
0.5000 mg | ORAL_TABLET | Freq: Two times a day (BID) | ORAL | Status: DC
  Administered 2021-02-25 – 2021-02-26 (×2): 0.5 mg via ORAL
  Filled 2021-02-25 (×2): qty 1

## 2021-02-25 MED ORDER — ARIPIPRAZOLE 5 MG PO TABS
5.0000 mg | ORAL_TABLET | Freq: Every day | ORAL | Status: DC
  Administered 2021-02-25 – 2021-02-26 (×2): 5 mg via ORAL
  Filled 2021-02-25 (×2): qty 1

## 2021-02-25 MED ORDER — MAGNESIUM HYDROXIDE 400 MG/5ML PO SUSP: 30.0000 mL | Freq: Every day | ORAL | Status: DC | PRN

## 2021-02-25 MED ORDER — ALUM & MAG HYDROXIDE-SIMETH 200-200-20 MG/5ML PO SUSP: 30.0000 mL | ORAL | Status: DC | PRN

## 2021-02-25 MED ORDER — ACETAMINOPHEN 325 MG PO TABS: 650.0000 mg | ORAL_TABLET | Freq: Four times a day (QID) | ORAL | Status: DC | PRN

## 2021-02-25 MED ORDER — NICOTINE 21 MG/24HR TD PT24
21.0000 mg | MEDICATED_PATCH | Freq: Every day | TRANSDERMAL | Status: DC
  Administered 2021-02-25 – 2021-02-26 (×2): 21 mg via TRANSDERMAL
  Filled 2021-02-25 (×3): qty 1

## 2021-02-25 MED ORDER — TAMSULOSIN HCL 0.4 MG PO CAPS
0.4000 mg | ORAL_CAPSULE | Freq: Every day | ORAL | Status: DC
  Administered 2021-02-25 – 2021-02-26 (×2): 0.4 mg via ORAL
  Filled 2021-02-25 (×2): qty 1

## 2021-02-25 MED ORDER — OXCARBAZEPINE 150 MG PO TABS
150.0000 mg | ORAL_TABLET | Freq: Two times a day (BID) | ORAL | Status: DC
  Administered 2021-02-25 – 2021-02-26 (×2): 150 mg via ORAL
  Filled 2021-02-25 (×2): qty 1

## 2021-02-25 MED ORDER — TRAZODONE HCL 50 MG PO TABS: 50.0000 mg | ORAL_TABLET | Freq: Every evening | ORAL | Status: DC | PRN

## 2021-02-25 MED ORDER — SERTRALINE HCL 50 MG PO TABS
150.0000 mg | ORAL_TABLET | Freq: Every day | ORAL | Status: DC
  Administered 2021-02-25 – 2021-02-26 (×2): 150 mg via ORAL
  Filled 2021-02-25 (×2): qty 1

## 2021-02-25 MED ORDER — HYDROXYZINE HCL 25 MG PO TABS: 25.0000 mg | ORAL_TABLET | Freq: Two times a day (BID) | ORAL | Status: DC | PRN

## 2021-02-25 MED ORDER — LEVOTHYROXINE SODIUM 25 MCG PO TABS
25.0000 ug | ORAL_TABLET | Freq: Every day | ORAL | Status: DC
  Administered 2021-02-26: 25 ug via ORAL
  Filled 2021-02-25: qty 1

## 2021-02-25 NOTE — BH Assessment (Signed)
Comprehensive Clinical Assessment (CCA) Note  02/25/2021 Christopher Giles [id number]  Disposition:  Per Melbourne Abts, PA, inpatient is recommended  The patient demonstrates the following risk factors for suicide: Chronic risk factors for suicide include: psychiatric disorder of depression, substance use disorder, previous self-harm with 6 prior suicide attempts, and medical illness of gastritis . Acute risk factors for suicide include: social withdrawal/isolation, loss (financial, interpersonal, professional), and recent discharge from inpatient psychiatry. Protective factors for this patient include:  patient states that he has no protective factors . Considering these factors, the overall suicide risk at this point appears to be high. Patient is not appropriate for outpatient follow up.    AIMS    Flowsheet Row [facility]) [facility] Ad[facility] ([facility]) [facility]  AIMS Total Score 0 0 0      AUDIT    Flowsheet Row [facility]) [facility] Ad[facility] ([facility]) [facility] Ad[facility] (Discharged) [facility]  Alcohol Use Disorder Identification Test Final Score (AUDIT) 0 0 0      PHQ2-9    Flowsheet Row ED from 02/24/2021 in [facility]  PHQ-2 Total Score 6  PHQ-9 Total Score 27      Flowsheet Row [facility] Most recent reading at 02/08/2021  2:31 PM ED from 02/08/2021 in [facility] Most recent reading at 02/08/2021  3:51 AM ED from 02/07/2021 in [facility] EMERGENCY  DEPARTMENT Most recent reading at 02/07/2021 12:23 PM  C-SSRS RISK CATEGORY Error: Q7 should not be populated when Q6 is No Moderate Risk High Risk       Chief Complaint:  Chief Complaint  Patient presents with   Suicidal   Visit Diagnosis:  F33.2 MDD Recurrent Severe, F14.20 Cocaine Use Disorder Severe   CCA Screening, Triage and Referral (STR)  Patient Reported Information How did you hear about [place]? Self  What Is the Reason for Your Visit/Call Today? Patient presents to the [facility] via EMS.  Per EDP Report: Patient called EMS said he took 20-30 xanax last night, when it didn't do anything he took 30-40 this morning around 1100. EMS brought all the meds from patient home and xanax 1mg   TID is in the bag, which is a 30 days RX that was filled on 02/17/21, there are 25 tablets in the bottle.  One of the neighbors said patient recently lost a friend and has been depressed.  He was hospitalized recently for depression.  Patient also has no power at the home and no food. TTS:  Patient was recently admitted to [facility] and discharged on June 6th.  Patient states that he was clean from cocaine and relapsed approximately 3 years ago and has been on a downward spiral since.  He states that he got sick with Gastro-intestinal issues and missed six weeks of work and his life started falling apart.  He states that he is being evicted from his apartment on the 24, he states that he currently has no power.  Patient states that he has never been homeless and he is scared of what is getting ready to happen to him.  Patient states that he feels hopeless like there  is no way out other than to kill himself.  Patient has at least six prior suicide attempts in the past.  Prior to his admission to [facility], he states that he was having thoughts about jumping off a bridge.  Patient states that he does not feel like his medication is working.  He denies thate he has used any cocaine since his discharge from the hospital. At the time of  this assessment, his UDS results are not available to confirm what he is reporting.  Patient denies HI/Psychosis.  He states that he has not been eating much, but reports that he has no food in the house.  He states that he has not been sleeping, but states that part of the reason for this is because he has bed bugs in his home.  Patient states that he feels like he needs to be admitted to the hospital to get his medications adjusted.  He states that he is not able to contract for safety outside of the hospital.  Patient is alert and oriented.  Depressed mood and flat affect.  His judgment, insight and impulse control are impaired.  His memory is intact and thoughts organized.  He is hopeless and feels helpless.  He does not appear to be responding to any internal stimuli.   How Long Has This Been Causing You Problems? 1-6 months  What Do You Feel Would Help You the Most Today? Treatment for Depression or other mood problem   Have You Recently Had Any Thoughts About Hurting Yourself? Yes  Are You Planning to Commit Suicide/Harm Yourself At This time? Yes   Have you Recently Had Thoughts About Hurting Someone Karolee Ohs? No  Are You Planning to Harm Someone at This Time? No  Explanation: No data recorded  Have You Used Any Alcohol or Drugs in the Past 24 Hours? No  How Long Ago Did You Use Drugs or Alcohol? No data recorded What Did You Use and How Much? No data recorded  Do You Currently Have a Therapist/Psychiatrist? Yes  Name of Therapist/Psychiatrist: Patient was supposed to Follow-up at the [facility] after his discharge from the hospital   Have You Been Recently Discharged From Any Office Practice or Programs? Yes  Explanation of Discharge From Practice/Program: D/C from [facility] on 02/15/21     CCA Screening Triage Referral Assessment Type of Contact: Tele-Assessment  Telemedicine Service Delivery:   Is this Initial or Reassessment? Initial Assessment  Date Telepsych consult ordered in  [facility]:  02/24/21  Time Telepsych consult ordered in [facility]:  1826  Location of Assessment: [facility] ED  Provider Location: [facility] Assessment Services   Collateral Involvement: none available   Does Patient Have a Automotive engineer Guardian? No data recorded Name and Contact of Legal Guardian: No data recorded If Minor and Not Living with Parent(s), Who has Custody? No data recorded Is CPS involved or ever been involved? Never  Is APS involved or ever been involved? Never   Patient Determined To Be At Risk for Harm To Self or Others Based on Review of Patient Reported Information or Presenting Complaint? Yes, for Self-Harm  Method: No data recorded Availability of Means: No data recorded Intent: No data recorded Notification Required: No data recorded Additional Information for Danger to Others Potential: No data recorded Additional Comments for Danger to Others Potential: No data recorded Are There Guns or Other Weapons in Your Home? No data recorded Types of Guns/Weapons: No data recorded Are These Weapons Safely Secured?  No data recorded Who Could Verify You Are Able To Have These Secured: No data recorded Do You Have any Outstanding Charges, Pending Court Dates, Parole/Probation? No data recorded Contacted To Inform of Risk of Harm To Self or Others: Unable to Contact:    Does Patient Present under Involuntary Commitment? No  IVC Papers Initial File Date: No data recorded  [place]   Patient Currently Receiving the Following Services: Medication Management; Individual Therapy   Determination of Need: Emergent (2 hours)   Options For Referral: Inpatient Hospitalization     CCA Biopsychosocial Patient Reported Schizophrenia/Schizoaffective Diagnosis in Past: No   Strengths: dependable, conscious of the things I do   Mental Health Symptoms Depression:   Change in energy/activity; Hopelessness; Increase/decrease  in appetite; Sleep (too much or little); Worthlessness   Duration of Depressive symptoms:  Duration of Depressive Symptoms: Greater than two weeks   Mania:   None   Anxiety:    Restlessness; Sleep; Worrying; Fatigue   Psychosis:   None   Duration of Psychotic symptoms:    Trauma:   None   Obsessions:   None   Compulsions:   None   Inattention:   None   Hyperactivity/Impulsivity:   None   Oppositional/Defiant Behaviors:   None   Emotional Irregularity:   Potentially harmful impulsivity; Recurrent suicidal behaviors/gestures/threats   Other Mood/Personality Symptoms:   Patient states that he is very depressed and feels hopeless    Mental Status Exam Appearance and self-care  Stature:   Average   Weight:   Average weight   Clothing:   Disheveled   Grooming:   Normal   Cosmetic use:   None   Posture/gait:   Normal   Motor activity:   Not Remarkable   Sensorium  Attention:   Normal   Concentration:   Normal   Orientation:   Object; Person; Place; Situation; Time   Recall/memory:   Normal   Affect and Mood  Affect:   Flat; Depressed   Mood:   Depressed   Relating  Eye contact:   Normal   Facial expression:   Depressed   Attitude toward examiner:   Cooperative   Thought and Language  Speech flow:  Clear and Coherent   Thought content:   Appropriate to Mood and Circumstances   Preoccupation:   None   Hallucinations:   None   Organization:  No data recorded  Affiliated Computer ServicesExecutive Functions  Fund of Knowledge:   Fair   Intelligence:   Average   Abstraction:   Functional   Judgement:   Impaired   Reality Testing:   Realistic   Insight:   Lacking   Decision Making:   Impulsive   Social Functioning  Social Maturity:   Responsible   Social Judgement:   Normal   Stress  Stressors:   Grief/losses; Housing; Surveyor, quantityinancial; Work   Coping Ability:   Exhausted; Overwhelmed   Skill Deficits:   Decision making;  Self-care   Supports:   Support needed     Religion: Religion/Spirituality What is Your Religious Affiliation?: Christian How Might This Affect Treatment?: UTA  Leisure/Recreation: Leisure / Recreation Do You Have Hobbies?: No  Exercise/Diet: Exercise/Diet Do You Exercise?: No Have You Gained or Lost A Significant Amount of Weight in the Past Six Months?: Yes-Lost Number of Pounds Lost?:  (amount of weight lost is unknown) Do You Follow a Special Diet?: No Do You Have Any Trouble Sleeping?: Yes Explanation of Sleeping Difficulties: sleeps a few  hours per night   CCA Employment/Education Employment/Work Situation: Employment / Work Situation Employment Situation: Employed Work Stressors: out of work due to medical issues Patient's Job has Been Impacted by Current Illness: No Has Patient ever Been in Equities trader?: No  Education: Education Is Patient Currently Attending School?: No Last Grade Completed: 12 Did You Product manager?: No Did You Have Any Difficulty At [employer]?: No Patient's Education Has Been Impacted by Current Illness: No   CCA Family/Childhood History Family and Relationship History: Family history Marital status: Single Does patient have children?: No  Childhood History:  Childhood History By whom was/is the patient raised?: Both parents Did patient suffer any verbal/emotional/physical/sexual abuse as a child?: No Did patient suffer from severe childhood neglect?: No Has patient ever been sexually abused/assaulted/raped as an adolescent or adult?: No Was the patient ever a victim of a crime or a disaster?: No Witnessed domestic violence?: No Has patient been affected by domestic violence as an adult?: No  Child/Adolescent Assessment:     CCA Substance Use Alcohol/Drug Use: Alcohol / Drug Use Pain Medications: see MAR Prescriptions: see MAR Over the Counter: see MAR History of alcohol / drug use?: Yes Longest period of sobriety  (when/how long): 13 years Negative Consequences of Use: Financial, Personal relationships Withdrawal Symptoms: None Substance #1 Name of Substance 1: cocaine 1 - Age of First Use: UTA 1 - Amount (size/oz): Patient reports that he recently has been smoking cocaine 1/2 g/day 1 - Frequency: daily 1 - Duration: states that he relapsed 3 years ago after 13 years clean 1 - Last Use / Amount: 2 weeks ago 1 - Method of Aquiring: off the street 1- Route of Use: smoke                       ASAM's:  Six Dimensions of Multidimensional Assessment  Dimension 1:  Acute Intoxication and/or Withdrawal Potential:   Dimension 1:  Description of individual's past and current experiences of substance use and withdrawal: Patient has no current withdrawal symptoms  Dimension 2:  Biomedical Conditions and Complications:   Dimension 2:  Description of patient's biomedical conditions and  complications: Patient states that he has been out of work for six weeks due to gastro-intestinal issues  Dimension 3:  Emotional, Behavioral, or Cognitive Conditions and Complications:  Dimension 3:  Description of emotional, behavioral, or cognitive conditions and complications: Patient states that he is hopeless and he is suicidal and sees no reason to go on with his life>  Attempted suicide by OD  Dimension 4:  Readiness to Change:  Dimension 4:  Description of Readiness to Change criteria: Patient states that he is ready to make positive changes in his life  Dimension 5:  Relapse, Continued use, or Continued Problem Potential:  Dimension 5:  Relapse, continued use, or continued problem potential critiera description: Patient states that he relapsed after 13 years clean  Dimension 6:  Recovery/Living Environment:  Dimension 6:  Recovery/Iiving environment criteria description: Patient is being evicted from his apartment  ASAM Severity Score: ASAM's Severity Rating Score: 13  ASAM Recommended Level of Treatment: ASAM  Recommended Level of Treatment: Level III Residential Treatment   Substance use Disorder (SUD) Substance Use Disorder (SUD)  Checklist Symptoms of Substance Use: Continued use despite having a persistent/recurrent physical/psychological problem caused/exacerbated by use, Continued use despite persistent or recurrent social, interpersonal problems, caused or exacerbated by use, Large amounts of time spent to obtain, use or recover from the  substance(s), Persistent desire or unsuccessful efforts to cut down or control use, Social, occupational, recreational activities given up or reduced due to use, Substance(s) often taken in larger amounts or over longer times than was intended  Recommendations for Services/Supports/Treatments: Recommendations for Services/Supports/Treatments Recommendations For Services/Supports/Treatments: Inpatient Hospitalization  Discharge Disposition:    DSM5 Diagnoses: Patient Active Problem List   Diagnosis Date Noted   Cocaine use disorder, severe, dependence (HCC) 02/09/2021   MDD (major depressive disorder) 02/08/2021   Major depressive disorder, 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   Moderate episode of recurrent major depressive disorder (HCC)    Severe episode of recurrent major depressive disorder, without psychotic features (HCC)      Referrals to Alternative Service(s): Referred to Alternative Service(s):   Place:   Date:   Time:    Referred to Alternative Service(s):   Place:   Date:   Time:    Referred to Alternative Service(s):   Place:   Date:   Time:    Referred to Alternative Service(s):   Place:   Date:   Time:     Denell Cothern J Thresea Doble, LCAS

## 2021-02-25 NOTE — Consult Note (Addendum)
[facility] Face-to-Face Psychiatry Consult   Reason for Consult: Psychiatric evaluation Referring Physician:  Dr. Johny Blamer, EDP Patient Identification: Christopher Giles MRN:  [id number] Principal Diagnosis: <principal problem not specified> Diagnosis:  Active Problems:   Major depressive disorder, recurrent severe without psychotic features (HCC)   Cocaine use disorder, severe, dependence (HCC)   Suicidal overdose (HCC)   Total Time spent with patient: 30 minutes  Subjective:   Christopher Giles is a 55 y.o. male patient admitted with Patient called EMS said he took 20-30 xanax last night, when it didn't do anything he took 30-40 this morning around 1100. EMS brought all the meds from patient home and xanax 1mg   TID is in the bag, which is a 30 days RX that was filled on 02/17/21, there are 25 tablets in the bottle.04/19/21  HPI:  Christopher Giles is 55 y.o male, seen by this provider face-to-face in the [facility] emergency department.  Reports "I tried to kill myself "took 50 Xanax".  Oriented to person place situation.  Cooperative during interview, sitting calmly in the emergency room on the chair.  Speech is normal, volume is soft, does not appear to be responding to internal or external stimuli.  Denies auditory and visual hallucinations.  Endorses suicidal ideation with intent, reports "I do not know of a plan" since I took the Xanax.  Denies access to a weapon.  Denies any self injurious behavior.  Unable to contract for safety.  Past suicide attempts equal 6 previous times, last time was in 2017, he took a bottle of Tylenol.  Denies homicidal ideation.  Denies anger or aggressive behavior.  Uses cocaine, last was a few weeks ago.  Does not endorse history of abuse or trauma.  Endorses that he sleeps about 5 hours per night, has a decreased appetite, eats 1-2 times per day.  His psychiatrist is Dr2018 who prescribes the Xanax.  His recent inpatient admission he was discharged on February 15, 2021.  He had resources  assigned for the [facility], he did not make that appointment," I felt that it was pointless to go"reports compliance with his current medications, he is able to name these medications.  Past Psychiatric History: depression, anxiety.   Risk to Self:  yes Risk to Others:  no Prior Inpatient Therapy:  yes Prior Outpatient Therapy:  yes  Past Medical History:  Past Medical History:  Diagnosis Date   Anxiety    Depression    Moderate cocaine use disorder (HCC) 02/09/2021   Substance abuse (HCC)    Suicide attempt (HCC)    x 6 per pt   Thyroid disease    hypo    Past Surgical History:  Procedure Laterality Date   punctured lung     Family History:  Family History  Problem Relation Age of Onset   Hyperlipidemia Mother    Cancer Mother    Cancer Father    Diabetes Maternal Grandmother    Family Psychiatric  History: unknown Social History:  Social History   Substance and Sexual Activity  Alcohol Use No     Social History   Substance and Sexual Activity  Drug Use No    Social History   Socioeconomic History   Marital status: Single    Spouse name: Not on file   Number of children: Not on file   Years of education: 12   Highest education level: High school graduate  Occupational History   Not on file  Tobacco Use   Smoking  status: Every Day    Packs/day: 1.00    Pack years: 0.00    Types: Cigarettes   Smokeless tobacco: Never  Substance and Sexual Activity   Alcohol use: No   Drug use: No   Sexual activity: Never    Birth control/protection: Abstinence  Other Topics Concern   Not on file  Social History Narrative   Not on file   Social Determinants of Health   Financial Resource Strain: Not on file  Food Insecurity: Not on file  Transportation Needs: Not on file  Physical Activity: Not on file  Stress: Not on file  Social Connections: Not on file   Additional Social History:    Allergies:  No Known Allergies  Labs:  Results for orders placed or  performed during the hospital encounter of 02/24/21 (from the past 48 hour(s))  Resp Panel by RT-PCR (Flu A&B, Covid) Nasopharyngeal Swab     Status: None   Collection Time: 02/24/21  4:23 PM   Specimen: Nasopharyngeal Swab; Nasopharyngeal(NP) swabs in vial transport medium  Result Value Ref Range   SARS Coronavirus 2 by RT PCR NEGATIVE NEGATIVE    Comment: (NOTE) SARS-CoV-2 target nucleic acids are NOT DETECTED.  The SARS-CoV-2 RNA is generally detectable in upper respiratory specimens during the acute phase of infection. The lowest concentration of SARS-CoV-2 viral copies this assay can detect is 138 copies/mL. A negative result does not preclude SARS-Cov-2 infection and should not be used as the sole basis for treatment or other patient management decisions. A negative result may occur with  improper specimen collection/handling, submission of specimen other than nasopharyngeal swab, presence of viral mutation(s) within the areas targeted by this assay, and inadequate number of viral copies(<138 copies/mL). A negative result must be combined with clinical observations, patient history, and epidemiological information. The expected result is Negative.  Fact Sheet for Patients:  BloggerCourse.com  Fact Sheet for Healthcare Providers:  SeriousBroker.it  This test is no t yet approved or cleared by the [place] FDA and  has been authorized for detection and/or diagnosis of SARS-CoV-2 by FDA under an Emergency Use Authorization (EUA). This EUA will remain  in effect (meaning this test can be used) for the duration of the COVID-19 declaration under Section 564(b)(1) of the Act, 21 U.S.C.section 360bbb-3(b)(1), unless the authorization is terminated  or revoked sooner.       Influenza A by PCR NEGATIVE NEGATIVE   Influenza B by PCR NEGATIVE NEGATIVE    Comment: (NOTE) The Xpert Xpress SARS-CoV-2/FLU/RSV plus assay is intended  as an aid in the diagnosis of influenza from Nasopharyngeal swab specimens and should not be used as a sole basis for treatment. Nasal washings and aspirates are unacceptable for Xpert Xpress SARS-CoV-2/FLU/RSV testing.  Fact Sheet for Patients: BloggerCourse.com  Fact Sheet for Healthcare Providers: SeriousBroker.it  This test is not yet approved or cleared by the [place] FDA and has been authorized for detection and/or diagnosis of SARS-CoV-2 by FDA under an Emergency Use Authorization (EUA). This EUA will remain in effect (meaning this test can be used) for the duration of the COVID-19 declaration under Section 564(b)(1) of the Act, 21 U.S.C. section 360bbb-3(b)(1), unless the authorization is terminated or revoked.  Performed at [facility], [address], [place], [place] [address]   Comprehensive metabolic panel     Status: None   Collection Time: 02/24/21  4:23 PM  Result Value Ref Range   Sodium 138 135 - 145 mmol/L  Potassium 3.7 3.5 - 5.1 mmol/L   Chloride 103 98 - 111 mmol/L   CO2 26 22 - 32 mmol/L   Glucose, Bld 92 70 - 99 mg/dL    Comment: Glucose reference range applies only to samples taken after fasting for at least 8 hours.   BUN 13 6 - 20 mg/dL   Creatinine, Ser 4.090.95 0.61 - 1.24 mg/dL   Calcium 9.2 8.9 - 81.110.3 mg/dL   Total Protein 7.3 6.5 - 8.1 g/dL   Albumin 4.2 3.5 - 5.0 g/dL   AST 23 15 - 41 U/L   ALT 16 0 - 44 U/L   Alkaline Phosphatase 74 38 - 126 U/L   Total Bilirubin 0.5 0.3 - 1.2 mg/dL   GFR, Estimated >91>60 >47>60 mL/min    Comment: (NOTE) Calculated using the CKD-EPI Creatinine Equation (2021)    Anion gap 9 5 - 15    Comment: Performed at [facility], [address] [place], [place] [address]  Ethanol     Status: None   Collection Time: 02/24/21  4:23 PM  Result Value Ref Range   Alcohol, Ethyl (B) <10 <10 mg/dL    Comment: (NOTE) Lowest  detectable limit for serum alcohol is 10 mg/dL.  For medical purposes only. Performed at [facility], [address] [place], [place] [address]   CBC with Diff     Status: None   Collection Time: 02/24/21  4:23 PM  Result Value Ref Range   WBC 7.8 4.0 - 10.5 K/uL   RBC 4.44 4.22 - 5.81 MIL/uL   Hemoglobin 13.9 13.0 - 17.0 g/dL   HCT 65.740.7 84.639.0 - 96.252.0 %   MCV 91.7 80.0 - 100.0 fL   MCH 31.3 26.0 - 34.0 pg   MCHC 34.2 30.0 - 36.0 g/dL   RDW 95.212.3 84.111.5 - 32.415.5 %   Platelets 258 150 - 400 K/uL   nRBC 0.0 0.0 - 0.2 %   Neutrophils Relative % 72 %   Neutro Abs 5.6 1.7 - 7.7 K/uL   Lymphocytes Relative 20 %   Lymphs Abs 1.6 0.7 - 4.0 K/uL   Monocytes Relative 6 %   Monocytes Absolute 0.5 0.1 - 1.0 K/uL   Eosinophils Relative 1 %   Eosinophils Absolute 0.1 0.0 - 0.5 K/uL   Basophils Relative 1 %   Basophils Absolute 0.0 0.0 - 0.1 K/uL   Immature Granulocytes 0 %   Abs Immature Granulocytes 0.01 0.00 - 0.07 K/uL    Comment: Performed at [facility], [address] [place], [place] [address]  Acetaminophen level     Status: Abnormal   Collection Time: 02/24/21  4:23 PM  Result Value Ref Range   Acetaminophen (Tylenol), Serum <10 (L) 10 - 30 ug/mL    Comment: (NOTE) Therapeutic concentrations vary significantly. A range of 10-30 ug/mL  may be an effective concentration for many patients. However, some  are best treated at concentrations outside of this range. Acetaminophen concentrations >150 ug/mL at 4 hours after ingestion  and >50 ug/mL at 12 hours after ingestion are often associated with  toxic reactions.  Performed at [facility], [address] [place], [place] [address]   Salicylate level     Status: Abnormal   Collection Time: 02/24/21  4:23 PM  Result Value Ref Range   Salicylate Lvl <7.0 (L) 7.0 - 30.0 mg/dL    Comment: Performed at [facility], [address]  [place], [place] [address]   Magnesium     Status: None   Collection Time: 02/24/21  4:23 PM  Result Value Ref Range   Magnesium 2.2 1.7 - 2.4 mg/dL    Comment: Performed at [facility], [address], [place], [place] [address]  Urine rapid drug screen (hosp performed)     Status: Abnormal   Collection Time: 02/25/21  3:00 AM  Result Value Ref Range   Opiates NONE DETECTED NONE DETECTED   Cocaine NONE DETECTED NONE DETECTED   Benzodiazepines POSITIVE (A) NONE DETECTED   Amphetamines NONE DETECTED NONE DETECTED   Tetrahydrocannabinol POSITIVE (A) NONE DETECTED   Barbiturates NONE DETECTED NONE DETECTED    Comment: (NOTE) DRUG SCREEN FOR MEDICAL PURPOSES ONLY.  IF CONFIRMATION IS NEEDED FOR ANY PURPOSE, NOTIFY LAB WITHIN 5 DAYS.  LOWEST DETECTABLE LIMITS FOR URINE DRUG SCREEN Drug Class                     Cutoff (ng/mL) Amphetamine and metabolites    1000 Barbiturate and metabolites    200 Benzodiazepine                 200 Tricyclics and metabolites     300 Opiates and metabolites        300 Cocaine and metabolites        300 THC                            50 Performed at [facility], [address], [place], [place] [address]     No current facility-administered medications for this encounter.   Current Outpatient Medications  Medication Sig Dispense Refill   ALPRAZolam (XANAX) 0.5 MG tablet Take 1 tablet (0.5 mg total) by mouth 2 (two) times daily. For anxiety 6 tablet 0   ARIPiprazole (ABILIFY) 5 MG tablet Take 1 tablet (5 mg total) by mouth daily. For mood control 30 tablet 0   hydrOXYzine (ATARAX/VISTARIL) 25 MG tablet Take 1 tablet (25 mg total) by mouth 2 (two) times daily as needed for anxiety. 75 tablet 0   levothyroxine (SYNTHROID) 25 MCG tablet Take 1 tablet (25 mcg total) by mouth daily at 6 (six) AM. For low functioning thyroid gland 15 tablet 0   naproxen sodium (ALEVE) 220 MG tablet Take 220-440 mg by mouth 2 (two) times daily as needed (for  pain or headaches).     nicotine (NICODERM CQ - DOSED IN MG/24 HOURS) 21 mg/24hr patch Place 1 patch (21 mg total) onto the skin daily. (May buy from over the counter): For smoking cessation 1 patch 0   OXcarbazepine (TRILEPTAL) 150 MG tablet Take 1 tablet (150 mg total) by mouth 2 (two) times daily. For mood stabilization 60 tablet 0   polyethylene glycol (MIRALAX / GLYCOLAX) 17 g packet Take 17 g by mouth daily. (May buy from over the counter): For constipation (Patient taking differently: Take 17 g by mouth daily as needed for moderate constipation.) 1 each 0   sertraline (ZOLOFT) 50 MG tablet Take 3 tablets (150 mg total) by mouth daily. For depression 150 tablet 0   silodosin (RAPAFLO) 8 MG CAPS capsule Take 8 mg by mouth daily with breakfast.     docusate sodium (COLACE) 100 MG capsule Take 1 capsule (100 mg total) by mouth daily. (May buy from over the counter): For constipation (Patient not taking: No sig reported) 1 capsule 0  famotidine (PEPCID) 20 MG tablet Take 1 tablet (20 mg total) by mouth at bedtime. For acid reflux (Patient not taking: No sig reported) 60 tablet 0   Multiple Vitamin (MULTIVITAMIN WITH MINERALS) TABS tablet Take 1 tablet by mouth daily. (May buy from over the counter): Nutritional supplement (Patient not taking: No sig reported) 1 tablet 0   tamsulosin (FLOMAX) 0.4 MG CAPS capsule Take 1 capsule (0.4 mg total) by mouth daily. For Prostate health (Patient not taking: No sig reported) 15 capsule 0   traZODone (DESYREL) 50 MG tablet Take 1 tablet (50 mg total) by mouth at bedtime as needed for sleep. (Patient not taking: No sig reported) 30 tablet 0    Musculoskeletal: Strength & Muscle Tone: within normal limits Gait & Station: normal Patient leans: N/A            Psychiatric Specialty Exam:  Presentation  General Appearance: Disheveled  Eye Contact:Good  Speech:Clear and Coherent  Speech Volume:Normal  Handedness:Right   Mood and Affect   Mood:Depressed; Hopeless  Affect:Congruent   Thought Process  Thought Processes:Coherent; Goal Directed  Descriptions of Associations:Intact  Orientation:Full (Time, Place and Person)  Thought Content:Logical  History of Schizophrenia/Schizoaffective disorder:No  Duration of Psychotic Symptoms:No data recorded Hallucinations:Hallucinations: None Ideas of Reference:None  Suicidal Thoughts:Suicidal Thoughts: Yes, Passive SI Passive Intent and/or Plan: With Intent; Without Plan; With Means to Carry Out; With Access to Means Homicidal Thoughts:Homicidal Thoughts: No  Sensorium  Memory:Immediate Good; Recent Good; Recent Poor  Judgment:Good  Insight:Fair   Executive Functions  Concentration:Fair  Attention Span:Good  Recall:Good  Fund of Knowledge:Good  Language:Good   Psychomotor Activity  Psychomotor Activity: Psychomotor Activity: Normal  Assets  Assets:Communication Skills; Desire for Improvement; Housing   Sleep  Sleep: Sleep: Fair Number of Hours of Sleep: 5  Physical Exam: Physical Exam Cardiovascular:     Rate and Rhythm: Bradycardia present.  Pulmonary:     Effort: Pulmonary effort is normal.  Neurological:     Mental Status: He is alert and oriented to person, place, and time.  Psychiatric:        Attention and Perception: Attention normal.        Mood and Affect: Mood is depressed. Affect is flat.        Speech: Speech normal.        Behavior: Behavior is cooperative.        Thought Content: Thought content includes suicidal ideation. Thought content does not include homicidal ideation. Thought content includes suicidal plan. Thought content does not include homicidal plan.        Cognition and Memory: Cognition normal.   Review of Systems  Respiratory:  Negative for shortness of breath.   Cardiovascular:  Negative for chest pain.  Neurological:  Negative for headaches.  Psychiatric/Behavioral:  Positive for depression, substance  abuse and suicidal ideas. Negative for hallucinations. The patient does not have insomnia.   Blood pressure 107/64, pulse (!) 58, temperature 98.1 F (36.7 C), temperature source Oral, resp. rate 20, height  (1.905 m), weight 91 kg, SpO2 96 %. Body mass index is 25.08 kg/m.  Treatment Plan Summary: Plan :  Recommend transfer to [facility] for 23 hour observation. He is unable to contract for safety.   Disposition:  Transfer to [facility] for observation. Spoke with Deanna Artis, NP. ED staff notified of plan via secure chat. Nurse to nurse report to be called.   Novella Olive, NP 02/25/2021 2:15 PM

## 2021-02-25 NOTE — ED Notes (Signed)
Pt's home medications taken to pharmacy. Pt instructed to come back to the ER when he is discharged from [facility] to pick up his meds from the pharmacy.

## 2021-02-25 NOTE — Progress Notes (Addendum)
Pt is admitted to OBS bed 3 due to Suicidal attempt.  Pt is alert and oriented with blunted affect. Pt is ambulatory and oriented to staff/unit. Pt presents disheveled with dirty clothes. Pt endorses SI with no plan. Pt verbally contracts for safety on the unit. Pt denies pain, HI and AVH at this time. Staff will monitor for pt's safety.

## 2021-02-25 NOTE — ED Notes (Signed)
Patient arrived on unit, Patient cooperative, patient lying down resting

## 2021-02-25 NOTE — ED Notes (Signed)
Pt resting in bed watching TV. A&O x4, calm and cooperative. No signs of acute distress noted. Will continue to monitor for safety.

## 2021-02-25 NOTE — ED Provider Notes (Signed)
Behavioral Health Admission [facility])  Date: 02/25/21 Patient Name: Christopher Giles MRN: [id number] Chief Complaint: No chief complaint on file.     Diagnoses:  Final diagnoses:  Major depressive disorder, recurrent severe without psychotic features [facility])    HPI: Patient presents voluntarily to [facility] behavioral health, transferred from [facility] emergency department.  Patient is assessed by nurse practitioner.  He is alert and oriented, answers appropriately.  He is insightful regarding the situation.  He reports "I do not want to kill myself, I want a solution."  Recent stressors include financial difficulties.  Patient reports his power in his apartment has recently been disconnected.  He reports he will likely be homeless on June 24 as he has been unable to pay his rent.  He states "I need monetary resources."  Christopher Giles reports an intentional overdose on approximately 50 Xanax tablets on yesterday.  Today he denies suicidal and homicidal ideations.  He is future oriented.  He is able to contract for safety with this Clinical research associate.  He denies auditory and visual hallucinations.  There is no evidence of delusional thought content and he denies symptoms of paranoia.  Christopher Giles has been diagnosed with major depressive disorder as well as cocaine use disorder in the past.  He reports compliance with medications including Xanax, Abilify, Trileptal, Zoloft and trazodone.  He reports these medications have worked well in the past but believes his antidepressants are no longer "working."  He resides alone in [place].  He denies access to weapons.  He is currently not employed but typically works in [employer].  He endorses average sleep and appetite.  He endorses cocaine use, last use approximately 2 weeks ago.  He denies alcohol use.  Patient offered support and encouragement.  PHQ 2-9:  Flowsheet Row ED from 02/24/2021 in [facility]   Thoughts that you would be better off dead, or of hurting yourself in some way Nearly every day  PHQ-9 Total Score 27       Flowsheet Row ED from 02/25/2021 in [facility] ED from 02/24/2021 in [facility] Admission (Discharged) [facility]  C-SSRS RISK CATEGORY Error: Q7 should not be populated when Q6 is No High Risk Error: Q7 should not be populated when Q6 is No        Total Time spent with patient: 30 minutes  Musculoskeletal  Strength & Muscle Tone: within normal limits Gait & Station: normal Patient leans: N/A  Psychiatric Specialty Exam  Presentation General Appearance: Appropriate for Environment; Casual  Eye Contact:Good  Speech:Clear and Coherent; Normal Rate  Speech Volume:Normal  Handedness:Right   Mood and Affect  Mood:Euthymic  Affect:Appropriate; Congruent   Thought Process  Thought Processes:Coherent; Goal Directed  Descriptions of Associations:Intact  Orientation:Full (Time, Place and Person)  Thought Content:Logical  Diagnosis of Schizophrenia or Schizoaffective disorder in past: No   Hallucinations:Hallucinations: None  Ideas of Reference:None  Suicidal Thoughts:Suicidal Thoughts: No SI Passive Intent and/or Plan: With Intent; Without Plan; With Means to Carry Out; With Access to Means  Homicidal Thoughts:Homicidal Thoughts: No   Sensorium  Memory:Immediate Good; Recent Good; Remote Good  Judgment:Fair  Insight:Fair   Executive Functions  Concentration:Fair  Attention Span:Good  Recall:Good  Fund of Knowledge:Good  Language:Good   Psychomotor Activity  Psychomotor Activity:Psychomotor Activity: Normal   Assets  Assets:Communication Skills; Desire for Improvement; Financial Resources/Insurance; Housing; Intimacy; Leisure Time; Physical Health; Resilience;  Social Support   Sleep  Sleep:Sleep:  Fair Number of Hours of Sleep: 5   Nutritional Assessment (For OBS and [facility] admissions only) Has the patient had a weight loss or gain of 10 pounds or more in the last 3 months?: No Has the patient had a decrease in food intake/or appetite?: No Does the patient have dental problems?: No Does the patient have eating habits or behaviors that may be indicators of an eating disorder including binging or inducing vomiting?: No Has the patient recently lost weight without trying?: No Has the patient been eating poorly because of a decreased appetite?: No Malnutrition Screening Tool Score: 0    Physical Exam Vitals and nursing note reviewed.  Constitutional:      Appearance: Normal appearance. He is well-developed and normal weight.  HENT:     Head: Normocephalic and atraumatic.     Nose: Nose normal.  Cardiovascular:     Rate and Rhythm: Normal rate.  Pulmonary:     Effort: Pulmonary effort is normal.  Musculoskeletal:        General: Normal range of motion.     Cervical back: Normal range of motion.  Neurological:     Mental Status: He is alert and oriented to person, place, and time.  Psychiatric:        Attention and Perception: Attention and perception normal.        Mood and Affect: Mood and affect normal.        Speech: Speech normal.        Behavior: Behavior normal. Behavior is cooperative.        Thought Content: Thought content normal.        Cognition and Memory: Cognition and memory normal.        Judgment: Judgment normal.   Review of Systems  Constitutional: Negative.   HENT: Negative.    Eyes: Negative.   Respiratory: Negative.    Cardiovascular: Negative.   Gastrointestinal: Negative.   Genitourinary: Negative.   Musculoskeletal: Negative.   Skin: Negative.   Neurological: Negative.   Endo/Heme/Allergies: Negative.   Psychiatric/Behavioral:  Positive for substance abuse.    Blood pressure 90/75, pulse 80, SpO2 99 %. There is no height or weight on file  to calculate BMI.  Past Psychiatric History: Major depressive disorder, cocaine use disorder  Is the patient at risk to self? No  Has the patient been a risk to self in the past 6 months? Yes .    Has the patient been a risk to self within the distant past? Yes   Is the patient a risk to others? No   Has the patient been a risk to others in the past 6 months? No   Has the patient been a risk to others within the distant past? No   Past Medical History:  Past Medical History:  Diagnosis Date   Anxiety    Depression    Moderate cocaine use disorder (HCC) 02/09/2021   Substance abuse (HCC)    Suicide attempt (HCC)    x 6 per pt   Thyroid disease    hypo    Past Surgical History:  Procedure Laterality Date   punctured lung      Family History:  Family History  Problem Relation Age of Onset   Hyperlipidemia Mother    Cancer Mother    Cancer Father    Diabetes Maternal Grandmother     Social History:  Social History   Socioeconomic History   Marital  status: Single    Spouse name: Not on file   Number of children: Not on file   Years of education: 12   Highest education level: High school graduate  Occupational History   Not on file  Tobacco Use   Smoking status: Every Day    Packs/day: 1.00    Pack years: 0.00    Types: Cigarettes   Smokeless tobacco: Never  Substance and Sexual Activity   Alcohol use: No   Drug use: No   Sexual activity: Never    Birth control/protection: Abstinence  Other Topics Concern   Not on file  Social History Narrative   Not on file   Social Determinants of Health   Financial Resource Strain: Not on file  Food Insecurity: Not on file  Transportation Needs: Not on file  Physical Activity: Not on file  Stress: Not on file  Social Connections: Not on file  Intimate Partner Violence: Not on file    SDOH:  SDOH Screenings   Alcohol Screen: Low Risk    Last Alcohol Screening Score (AUDIT): 0  Depression (PHQ2-9): Medium Risk    PHQ-2 Score: 27  Financial Resource Strain: Not on file  Food Insecurity: Not on file  Housing: Not on file  Physical Activity: Not on file  Social Connections: Not on file  Stress: Not on file  Tobacco Use: High Risk   Smoking Tobacco Use: Every Day   Smokeless Tobacco Use: Never  Transportation Needs: Not on file    Last Labs:  Admission on 02/24/2021, Discharged on 02/25/2021  Component Date Value Ref Range Status   SARS Coronavirus 2 by RT PCR 02/24/2021 NEGATIVE  NEGATIVE Final   Comment: (NOTE) SARS-CoV-2 target nucleic acids are NOT DETECTED.  The SARS-CoV-2 RNA is generally detectable in upper respiratory specimens during the acute phase of infection. The lowest concentration of SARS-CoV-2 viral copies this assay can detect is 138 copies/mL. A negative result does not preclude SARS-Cov-2 infection and should not be used as the sole basis for treatment or other patient management decisions. A negative result may occur with  improper specimen collection/handling, submission of specimen other than nasopharyngeal swab, presence of viral mutation(s) within the areas targeted by this assay, and inadequate number of viral copies(<138 copies/mL). A negative result must be combined with clinical observations, patient history, and epidemiological information. The expected result is Negative.  Fact Sheet for Patients:  BloggerCourse.comhttps://www.fda.gov/media/152166/download  Fact Sheet for Healthcare Providers:  SeriousBroker.ithttps://www.fda.gov/media/152162/download  This test is no                          t yet approved or cleared by the [place] FDA and  has been authorized for detection and/or diagnosis of SARS-CoV-2 by FDA under an Emergency Use Authorization (EUA). This EUA will remain  in effect (meaning this test can be used) for the duration of the COVID-19 declaration under Section 564(b)(1) of the Act, 21 U.S.C.section 360bbb-3(b)(1), unless the authorization is terminated  or  revoked sooner.       Influenza A by PCR 02/24/2021 NEGATIVE  NEGATIVE Final   Influenza B by PCR 02/24/2021 NEGATIVE  NEGATIVE Final   Comment: (NOTE) The Xpert Xpress SARS-CoV-2/FLU/RSV plus assay is intended as an aid in the diagnosis of influenza from Nasopharyngeal swab specimens and should not be used as a sole basis for treatment. Nasal washings and aspirates are unacceptable for Xpert Xpress SARS-CoV-2/FLU/RSV testing.  Fact Sheet for Patients: BloggerCourse.comhttps://www.fda.gov/media/152166/download  Fact Sheet for Healthcare Providers: SeriousBroker.it  This test is not yet approved or cleared by the [place] FDA and has been authorized for detection and/or diagnosis of SARS-CoV-2 by FDA under an Emergency Use Authorization (EUA). This EUA will remain in effect (meaning this test can be used) for the duration of the COVID-19 declaration under Section 564(b)(1) of the Act, 21 U.S.C. section 360bbb-3(b)(1), unless the authorization is terminated or revoked.  Performed at [facility], [address], [place], [place] [address]    Sodium 02/24/2021 138  135 - 145 mmol/L Final   Potassium 02/24/2021 3.7  3.5 - 5.1 mmol/L Final   Chloride 02/24/2021 103  98 - 111 mmol/L Final   CO2 02/24/2021 26  22 - 32 mmol/L Final   Glucose, Bld 02/24/2021 92  70 - 99 mg/dL Final   Glucose reference range applies only to samples taken after fasting for at least 8 hours.   BUN 02/24/2021 13  6 - 20 mg/dL Final   Creatinine, Ser 02/24/2021 0.95  0.61 - 1.24 mg/dL Final   Calcium 64/33/2951 9.2  8.9 - 10.3 mg/dL Final   Total Protein 88/41/6606 7.3  6.5 - 8.1 g/dL Final   Albumin 30/16/0109 4.2  3.5 - 5.0 g/dL Final   AST 32/35/5732 23  15 - 41 U/L Final   ALT 02/24/2021 16  0 - 44 U/L Final   Alkaline Phosphatase 02/24/2021 74  38 - 126 U/L Final   Total Bilirubin 02/24/2021 0.5  0.3 - 1.2 mg/dL Final   GFR, Estimated 02/24/2021 >60  >60 mL/min  Final   Comment: (NOTE) Calculated using the CKD-EPI Creatinine Equation (2021)    Anion gap 02/24/2021 9  5 - 15 Final   Performed at [facility], [address], [place], [place] [address]   Alcohol, Ethyl (B) 02/24/2021 <10  <10 mg/dL Final   Comment: (NOTE) Lowest detectable limit for serum alcohol is 10 mg/dL.  For medical purposes only. Performed at [facility], [address] [place], [place] [address]    Opiates 02/25/2021 NONE DETECTED  NONE DETECTED Final   Cocaine 02/25/2021 NONE DETECTED  NONE DETECTED Final   Benzodiazepines 02/25/2021 POSITIVE (A) NONE DETECTED Final   Amphetamines 02/25/2021 NONE DETECTED  NONE DETECTED Final   Tetrahydrocannabinol 02/25/2021 POSITIVE (A) NONE DETECTED Final   Barbiturates 02/25/2021 NONE DETECTED  NONE DETECTED Final   Comment: (NOTE) DRUG SCREEN FOR MEDICAL PURPOSES ONLY.  IF CONFIRMATION IS NEEDED FOR ANY PURPOSE, NOTIFY LAB WITHIN 5 DAYS.  LOWEST DETECTABLE LIMITS FOR URINE DRUG SCREEN Drug Class                     Cutoff (ng/mL) Amphetamine and metabolites    1000 Barbiturate and metabolites    200 Benzodiazepine                 200 Tricyclics and metabolites     300 Opiates and metabolites        300 Cocaine and metabolites        300 THC                            50 Performed at [facility], [address], [place], [place] [address]    WBC 02/24/2021 7.8  4.0 - 10.5 K/uL Final   RBC 02/24/2021 4.44  4.22 - 5.81 MIL/uL Final   Hemoglobin  02/24/2021 13.9  13.0 - 17.0 g/dL Final   HCT 16/06/9603 40.7  39.0 - 52.0 % Final   MCV 02/24/2021 91.7  80.0 - 100.0 fL Final   MCH 02/24/2021 31.3  26.0 - 34.0 pg Final   MCHC 02/24/2021 34.2  30.0 - 36.0 g/dL Final   RDW 54/05/8118 12.3  11.5 - 15.5 % Final   Platelets 02/24/2021 258  150 - 400 K/uL Final   nRBC 02/24/2021 0.0  0.0 - 0.2 % Final   Neutrophils Relative % 02/24/2021 72  % Final   Neutro Abs  02/24/2021 5.6  1.7 - 7.7 K/uL Final   Lymphocytes Relative 02/24/2021 20  % Final   Lymphs Abs 02/24/2021 1.6  0.7 - 4.0 K/uL Final   Monocytes Relative 02/24/2021 6  % Final   Monocytes Absolute 02/24/2021 0.5  0.1 - 1.0 K/uL Final   Eosinophils Relative 02/24/2021 1  % Final   Eosinophils Absolute 02/24/2021 0.1  0.0 - 0.5 K/uL Final   Basophils Relative 02/24/2021 1  % Final   Basophils Absolute 02/24/2021 0.0  0.0 - 0.1 K/uL Final   Immature Granulocytes 02/24/2021 0  % Final   Abs Immature Granulocytes 02/24/2021 0.01  0.00 - 0.07 K/uL Final   Performed at [facility], [address], [place], [place] [address]   Acetaminophen (Tylenol), Serum 02/24/2021 <10 (A) 10 - 30 ug/mL Final   Comment: (NOTE) Therapeutic concentrations vary significantly. A range of 10-30 ug/mL  may be an effective concentration for many patients. However, some  are best treated at concentrations outside of this range. Acetaminophen concentrations >150 ug/mL at 4 hours after ingestion  and >50 ug/mL at 12 hours after ingestion are often associated with  toxic reactions.  Performed at [facility], [address], [place], [place] [address]    Salicylate Lvl 02/24/2021 <7.0 (A) 7.0 - 30.0 mg/dL Final   Performed at [facility], [address], [place], [place] [address]   Magnesium 02/24/2021 2.2  1.7 - 2.4 mg/dL Final   Performed at [facility], [address] [place], [place] [address]  Admission on 02/08/2021, Discharged on 02/15/2021  Component Date Value Ref Range Status   Sodium 02/10/2021 138  135 - 145 mmol/L Final   Potassium 02/10/2021 3.8  3.5 - 5.1 mmol/L Final   Chloride 02/10/2021 102  98 - 111 mmol/L Final   CO2 02/10/2021 28  22 - 32 mmol/L Final   Glucose, Bld 02/10/2021 112 (A) 70 - 99 mg/dL Final   Glucose reference range applies only to samples taken after fasting for at least 8 hours.   BUN 02/10/2021  13  6 - 20 mg/dL Final   Creatinine, Ser 02/10/2021 0.83  0.61 - 1.24 mg/dL Final   Calcium 62/95/2841 9.4  8.9 - 10.3 mg/dL Final   Total Protein 32/44/0102 6.7  6.5 - 8.1 g/dL Final   Albumin 72/53/6644 4.0  3.5 - 5.0 g/dL Final   AST 03/47/4259 17  15 - 41 U/L Final   ALT 02/10/2021 14  0 - 44 U/L Final   Alkaline Phosphatase 02/10/2021 62  38 - 126 U/L Final   Total Bilirubin 02/10/2021 0.4  0.3 - 1.2 mg/dL Final   GFR, Estimated 02/10/2021 >60  >60 mL/min Final   Comment: (NOTE) Calculated using the CKD-EPI Creatinine Equation (2021)    Anion gap 02/10/2021 8  5 - 15 Final   Performed at [facility], [address], [place], [place] [address]   Hgb A1c MFr Bld 02/10/2021 5.6  4.8 - 5.6 % Final   Comment: (NOTE)         Prediabetes: 5.7 - 6.4         Diabetes: >6.4         Glycemic control for adults with diabetes: <7.0    Mean Plasma Glucose 02/10/2021 114  mg/dL Final   Comment: (NOTE) Performed At: [facility] [address] [place], [place] [id number] Jolene Schimke MD XB:[id number]    Cholesterol 02/10/2021 163  0 - 200 mg/dL Final   Triglycerides 30/86/5784 409 (A) <150 mg/dL Final   HDL 69/62/9528 34 (A) >40 mg/dL Final   Total CHOL/HDL Ratio 02/10/2021 4.8  RATIO Final   VLDL 02/10/2021 UNABLE TO CALCULATE IF TRIGLYCERIDE OVER 400 mg/dL  0 - 40 mg/dL Final   LDL Cholesterol 02/10/2021 UNABLE TO CALCULATE IF TRIGLYCERIDE OVER 400 mg/dL  0 - 99 mg/dL Final   Comment:        Total Cholesterol/HDL:CHD Risk Coronary Heart Disease Risk Table                     Men   Women  1/2 Average Risk   3.4   3.3  Average Risk       5.0   4.4  2 X Average Risk   9.6   7.1  3 X Average Risk  23.4   11.0        Use the calculated Patient Ratio above and the CHD Risk Table to determine the patient's CHD Risk.        ATP III CLASSIFICATION (LDL):  <100     mg/dL   Optimal  413-244  mg/dL   Near or Above                    Optimal  130-159  mg/dL    Borderline  010-272  mg/dL   High  >536     mg/dL   Very High Performed at [facility], [address] [place], [place] [address]    Free T4 02/10/2021 0.80  0.61 - 1.12 ng/dL Final   Comment: (NOTE) Biotin ingestion may interfere with free T4 tests. If the results are inconsistent with the TSH level, previous test results, or the clinical presentation, then consider biotin interference. If needed, order repeat testing after stopping biotin. Performed at [facility], [address], [place], [place] [address]    TSH 02/10/2021 4.672 (A) 0.350 - 4.500 uIU/mL Final   Comment: Performed by a 3rd Generation assay with a functional sensitivity of <=0.01 uIU/mL. Performed at [facility], [address] [place], [place] [address]    Direct LDL 02/10/2021 98.5  0 - 99 mg/dL Final   Performed at [facility], [address] [place], [place] [address]  Admission on 02/07/2021, Discharged on 02/08/2021  Component Date Value Ref Range Status   Sodium 02/07/2021 136  135 - 145 mmol/L Final   Potassium 02/07/2021 3.6  3.5 - 5.1 mmol/L Final   Chloride 02/07/2021 101  98 - 111 mmol/L Final   CO2 02/07/2021 23  22 - 32 mmol/L Final   Glucose, Bld 02/07/2021 123 (A) 70 - 99 mg/dL Final   Glucose reference range applies only to samples taken after fasting for at least 8 hours.   BUN 02/07/2021 19  6 - 20  mg/dL Final   Creatinine, Ser 02/07/2021 1.25 (A) 0.61 - 1.24 mg/dL Final   Calcium 88/41/6606 9.7  8.9 - 10.3 mg/dL Final   Total Protein 30/16/0109 7.4  6.5 - 8.1 g/dL Final   Albumin 32/35/5732 4.4  3.5 - 5.0 g/dL Final   AST 20/25/4270 26  15 - 41 U/L Final   ALT 02/07/2021 15  0 - 44 U/L Final   Alkaline Phosphatase 02/07/2021 70  38 - 126 U/L Final   Total Bilirubin 02/07/2021 0.8  0.3 - 1.2 mg/dL Final   GFR, Estimated 02/07/2021 >60  >60 mL/min Final   Comment: (NOTE) Calculated using the CKD-EPI Creatinine Equation (2021)     Anion gap 02/07/2021 12  5 - 15 Final   Performed at [facility], [address], [place], [place] [address]   Alcohol, Ethyl (B) 02/07/2021 <10  <10 mg/dL Final   Comment: (NOTE) Lowest detectable limit for serum alcohol is 10 mg/dL.  For medical purposes only. Performed at [facility], [address], [place], [place] [address]    Salicylate Lvl 02/07/2021 <7.0 (A) 7.0 - 30.0 mg/dL Final   Performed at [facility], [address], [place], [place] [address]   Acetaminophen (Tylenol), Serum 02/07/2021 <10 (A) 10 - 30 ug/mL Final   Comment: (NOTE) Therapeutic concentrations vary significantly. A range of 10-30 ug/mL  may be an effective concentration for many patients. However, some  are best treated at concentrations outside of this range. Acetaminophen concentrations >150 ug/mL at 4 hours after ingestion  and >50 ug/mL at 12 hours after ingestion are often associated with  toxic reactions.  Performed at [facility], [address], [place], [place] [address]    WBC 02/07/2021 8.8  4.0 - 10.5 K/uL Final   RBC 02/07/2021 4.89  4.22 - 5.81 MIL/uL Final   Hemoglobin 02/07/2021 15.3  13.0 - 17.0 g/dL Final   HCT 03/08/9484 45.6  39.0 - 52.0 % Final   MCV 02/07/2021 93.3  80.0 - 100.0 fL Final   MCH 02/07/2021 31.3  26.0 - 34.0 pg Final   MCHC 02/07/2021 33.6  30.0 - 36.0 g/dL Final   RDW 46/27/0350 12.1  11.5 - 15.5 % Final   Platelets 02/07/2021 286  150 - 400 K/uL Final   nRBC 02/07/2021 0.0  0.0 - 0.2 % Final   Performed at [facility], [address], [place], [place] [address]   Opiates 02/07/2021 NONE DETECTED  NONE DETECTED Final   Cocaine 02/07/2021 POSITIVE (A) NONE DETECTED Final   Benzodiazepines 02/07/2021 NONE DETECTED  NONE DETECTED Final   Amphetamines 02/07/2021 NONE DETECTED  NONE DETECTED Final   Tetrahydrocannabinol 02/07/2021 POSITIVE (A) NONE DETECTED Final   Barbiturates 02/07/2021 NONE DETECTED  NONE DETECTED Final    Comment: (NOTE) DRUG SCREEN FOR MEDICAL PURPOSES ONLY.  IF CONFIRMATION IS NEEDED FOR ANY PURPOSE, NOTIFY LAB WITHIN 5 DAYS.  LOWEST DETECTABLE LIMITS FOR URINE DRUG SCREEN Drug Class                     Cutoff (ng/mL) Amphetamine and metabolites    1000 Barbiturate and metabolites    200 Benzodiazepine                 200 Tricyclics and metabolites     300 Opiates and metabolites        300 Cocaine and metabolites  300 THC                            50 Performed at [facility], [address], [place], [place] [address]    SARS Coronavirus 2 by RT PCR 02/07/2021 NEGATIVE  NEGATIVE Final   Comment: (NOTE) SARS-CoV-2 target nucleic acids are NOT DETECTED.  The SARS-CoV-2 RNA is generally detectable in upper respiratory specimens during the acute phase of infection. The lowest concentration of SARS-CoV-2 viral copies this assay can detect is 138 copies/mL. A negative result does not preclude SARS-Cov-2 infection and should not be used as the sole basis for treatment or other patient management decisions. A negative result may occur with  improper specimen collection/handling, submission of specimen other than nasopharyngeal swab, presence of viral mutation(s) within the areas targeted by this assay, and inadequate number of viral copies(<138 copies/mL). A negative result must be combined with clinical observations, patient history, and epidemiological information. The expected result is Negative.  Fact Sheet for Patients:  BloggerCourse.com  Fact Sheet for Healthcare Providers:  SeriousBroker.it  This test is no                          t yet approved or cleared by the [place] FDA and  has been authorized for detection and/or diagnosis of SARS-CoV-2 by FDA under an Emergency Use Authorization (EUA). This EUA will remain  in effect (meaning this test can be used) for the duration of the COVID-19  declaration under Section 564(b)(1) of the Act, 21 U.S.C.section 360bbb-3(b)(1), unless the authorization is terminated  or revoked sooner.       Influenza A by PCR 02/07/2021 NEGATIVE  NEGATIVE Final   Influenza B by PCR 02/07/2021 NEGATIVE  NEGATIVE Final   Comment: (NOTE) The Xpert Xpress SARS-CoV-2/FLU/RSV plus assay is intended as an aid in the diagnosis of influenza from Nasopharyngeal swab specimens and should not be used as a sole basis for treatment. Nasal washings and aspirates are unacceptable for Xpert Xpress SARS-CoV-2/FLU/RSV testing.  Fact Sheet for Patients: BloggerCourse.com  Fact Sheet for Healthcare Providers: SeriousBroker.it  This test is not yet approved or cleared by the [place] FDA and has been authorized for detection and/or diagnosis of SARS-CoV-2 by FDA under an Emergency Use Authorization (EUA). This EUA will remain in effect (meaning this test can be used) for the duration of the COVID-19 declaration under Section 564(b)(1) of the Act, 21 U.S.C. section 360bbb-3(b)(1), unless the authorization is terminated or revoked.  Performed at [facility], [address], [place], [place] [address]   Admission on 11/11/2020, Discharged on 11/11/2020  Component Date Value Ref Range Status   WBC 11/11/2020 5.9  4.0 - 10.5 K/uL Final   RBC 11/11/2020 4.84  4.22 - 5.81 MIL/uL Final   Hemoglobin 11/11/2020 15.3  13.0 - 17.0 g/dL Final   HCT 08/65/7846 44.7  39.0 - 52.0 % Final   MCV 11/11/2020 92.4  80.0 - 100.0 fL Final   MCH 11/11/2020 31.6  26.0 - 34.0 pg Final   MCHC 11/11/2020 34.2  30.0 - 36.0 g/dL Final   RDW 96/29/5284 11.8  11.5 - 15.5 % Final   Platelets 11/11/2020 236  150 - 400 K/uL Final   nRBC 11/11/2020 0.0  0.0 - 0.2 % Final   Neutrophils Relative % 11/11/2020 69  % Final   Neutro Abs 11/11/2020 4.1  1.7 -  7.7 K/uL Final   Lymphocytes Relative 11/11/2020 21  % Final   Lymphs Abs  11/11/2020 1.3  0.7 - 4.0 K/uL Final   Monocytes Relative 11/11/2020 9  % Final   Monocytes Absolute 11/11/2020 0.5  0.1 - 1.0 K/uL Final   Eosinophils Relative 11/11/2020 0  % Final   Eosinophils Absolute 11/11/2020 0.0  0.0 - 0.5 K/uL Final   Basophils Relative 11/11/2020 1  % Final   Basophils Absolute 11/11/2020 0.1  0.0 - 0.1 K/uL Final   Immature Granulocytes 11/11/2020 0  % Final   Abs Immature Granulocytes 11/11/2020 0.01  0.00 - 0.07 K/uL Final   Performed at [facility], [address] [place], [place] [address]   Sodium 11/11/2020 137  135 - 145 mmol/L Final   Potassium 11/11/2020 4.0  3.5 - 5.1 mmol/L Final   Chloride 11/11/2020 101  98 - 111 mmol/L Final   CO2 11/11/2020 24  22 - 32 mmol/L Final   Glucose, Bld 11/11/2020 99  70 - 99 mg/dL Final   Glucose reference range applies only to samples taken after fasting for at least 8 hours.   BUN 11/11/2020 14  6 - 20 mg/dL Final   Creatinine, Ser 11/11/2020 0.89  0.61 - 1.24 mg/dL Final   Calcium 19/14/7829 9.1  8.9 - 10.3 mg/dL Final   Total Protein 56/21/3086 7.3  6.5 - 8.1 g/dL Final   Albumin 57/84/6962 4.3  3.5 - 5.0 g/dL Final   AST 95/28/4132 16  15 - 41 U/L Final   ALT 11/11/2020 13  0 - 44 U/L Final   Alkaline Phosphatase 11/11/2020 62  38 - 126 U/L Final   Total Bilirubin 11/11/2020 0.9  0.3 - 1.2 mg/dL Final   GFR, Estimated 11/11/2020 >60  >60 mL/min Final   Comment: (NOTE) Calculated using the CKD-EPI Creatinine Equation (2021)    Anion gap 11/11/2020 12  5 - 15 Final   Performed at [facility], [address], [place], [place] [address]   Lipase 11/11/2020 28  11 - 51 U/L Final   Performed at [facility], [address] [place], [place] [address]    Allergies: Patient has no known allergies.  PTA Medications: (Not in a hospital admission)   Medical Decision Making  Discussed restarting home medications, patient agrees with  plan. Medications: -Alprazolam 0.5 mg twice daily -Abilify 5 mg daily -Vistaril 25 mg twice daily as needed/anxiety -Levothyroxine 25 mcg daily -Oxcarbazepine 150 mg twice daily -Sertraline 150 mg daily -Tamsulosin 0.5 mg daily -Trazodone 50 mg nightly as needed/sleep    Recommendations  Based on my evaluation the patient does not appear to have an emergency medical condition. Patient reviewed with Dr. Lucianne Muss.  Patient will be placed in the continuous assessment area at [facility] for treatment and stabilization.  He will be reassessed on 02/26/2021, disposition will be determined at that time.  Lenard Lance, [name] 02/25/21  5:23 PM

## 2021-02-25 NOTE — ED Notes (Signed)
Pt easily awoken, cooperative at this time. Easily visualized from doorway.

## 2021-02-25 NOTE — ED Notes (Signed)
Pts home medications in clear bag given to receiving RN, Nash Dimmer.

## 2021-02-26 NOTE — ED Notes (Signed)
Pt sleeping in no acute distress. RR even and unlabored. Safety maintained. 

## 2021-02-26 NOTE — Progress Notes (Signed)
[facility] LCSW Note  02/26/2021   10:44 AM  Type of Contact and Topic:  Housing Resources  CSW contacted the following facilities to inquire about bed availability:  [facility]: Left VM for return call  [facility]: Unable to reach or leave message  CSW spoke with pt who is open to resources except for [employer]. CSW added [employer], [employer], and [employer] to [employer]. Pt also requested CSW call his sister to tell him that he's "Doing okay, sober for three weeks, and that I love her and miss her." Pt asked CSW not to disclose location, only that he is at [facility]. CSW contacted pt's sister, Veva Holes ([phone]), and passed along the message without revealing pt was hospitalized.   Wyvonnia Lora, LCSWA 02/26/2021  10:44 AM

## 2021-02-26 NOTE — Discharge Instructions (Addendum)
Patient is instructed prior to discharge to: ? Take all medications as prescribed by his/her mental healthcare provider. ?Report any adverse effects and or reactions from the medicines to his/her outpatient provider promptly. ?Keep all scheduled appointments, to ensure that you are getting refills on time and to avoid any interruption in your medication.  If you are unable to keep an appointment call to reschedule.  Be sure to follow-up with resources and follow-up appointments provided.  ?Patient has been instructed & cautioned: To not engage in alcohol and or illegal drug use while on prescription medicines. ?In the event of worsening symptoms, patient is instructed to call the crisis hotline, 911 and or go to the nearest ED for appropriate evaluation and treatment of symptoms. ?To follow-up with his/her primary care provider for your other medical issues, concerns and or health care needs. ?  ? ?Homeless Shelter List: ? ?  ? ?[facility] ? ?[address] [place], [place] ? ?Phone: [phone] ? ?  ? ?[facility] ? ?[address], [place], [place] [address] ? ?Phone: [phone] ? ?  ? ?[facility] (Women only) ? ?[address], [place], [place] [address] ? ?Phone: [phone] ? ?  ? ?[facility] ? ?[address] [place], [place] [address] ? ?Phone: [phone] ? ?  ? ?[facility]: ? ?[address] ? ?[place], [place] [address] ? ?Phone: [phone] ? ?  ? ?[facility] ? ?[address], [place], [place] [address] ? ?(Check in at 6:00PM for placement at a local shelter) ? ?Phone: [phone] ? ?

## 2021-02-26 NOTE — ED Notes (Signed)
Safe transport called 

## 2021-02-26 NOTE — ED Notes (Signed)
Pt discharged in no acute distress. Verbalized understanding of discharge instructions reviewed by staff. Safety maintained.

## 2021-02-26 NOTE — ED Provider Notes (Signed)
FBC/OBS ASAP Discharge Summary  Date and Time: 02/26/2021 11:41 AM  Name: Christopher Giles  MRN:  [id number]   Discharge Diagnoses:  Final diagnoses:  Major depressive disorder, recurrent severe without psychotic features (HCC)    Subjective: Patient states "I have family in the area, a sister, she is upset and disappointed related to my drug use and will not be able to help me."  He reports readiness to discharge as he is looking forward to following up with housing and other resources provided.  Patient is assessed by nurse practitioner.  He is alert and oriented, pleasant and cooperative during assessment.  He is insightful regarding situation.  He expresses readiness to follow-up with partners [facility].  He reports [facility] as they help him with transportation and the ability to ride the city bus.   He denies suicidal and homicidal ideations.  He contracts verbally for safety with this Clinical research associatewriter.  He denies auditory and visual hallucinations.  There is no evidence of delusional thought content he denies symptoms of paranoia.  Christopher Giles has been diagnosed with major depressive disorder as well as cocaine use disorder.  He reports he is followed outpatient by Dr. Evelene [place]onKaur in [place], sees her every 6 months.  He would like to follow-up with outpatient counseling, plans [facility].  Patient offered support and encouragement.  He denies any person to contact for collateral information at this time.  Stay Summary:  HPI 02/25/2021: [facility], [facility].  Patient is assessed by nurse practitioner.  He is alert and oriented, answers appropriately.  He is insightful regarding the situation.   He reports "I do not want to kill myself, I want a solution."   Recent stressors include financial difficulties.  Patient  reports his power in his apartment has recently been disconnected.  He reports he will likely be homeless on June 24 as he has been unable to pay his rent.  He states "I need monetary resources."   Christopher Giles reports an intentional overdose on approximately 50 Xanax tablets on yesterday.  Today he denies suicidal and homicidal ideations.  He is future oriented.  He is able to contract for safety with this Clinical research associatewriter.   He denies auditory and visual hallucinations.  There is no evidence of delusional thought content and he denies symptoms of paranoia.   Christopher Giles has been diagnosed with major depressive disorder as well as cocaine use disorder in the past.  He reports compliance with medications including Xanax, Abilify, Trileptal, Zoloft and trazodone.  He reports these medications have worked well in the past but believes his antidepressants are no longer "working."   He resides alone in [place].  He denies access to weapons.  He is currently not employed but typically works in [employer] industry.  He endorses average sleep and appetite.  He endorses cocaine use, last use approximately 2 weeks ago.  He denies alcohol use.   Patient offered support and encouragement.   Total Time spent with patient: 30 minutes  Past Psychiatric History: Major depressive disorder, cocaine use disorder Past Medical History:  Past Medical History:  Diagnosis Date   Anxiety    Depression    Moderate cocaine use disorder (HCC) 02/09/2021   Substance abuse (HCC)    Suicide attempt (HCC)    x 6 per pt   Thyroid disease    hypo  Past Surgical History:  Procedure Laterality Date   punctured lung     Family History:  Family History  Problem Relation Age of Onset   Hyperlipidemia Mother    Cancer Mother    Cancer Father    Diabetes Maternal Grandmother    Family Psychiatric History: None reported Social History:  Social History   Substance and Sexual Activity  Alcohol Use No     Social History    Substance and Sexual Activity  Drug Use No    Social History   Socioeconomic History   Marital status: Single    Spouse name: Not on file   Number of children: Not on file   Years of education: 12   Highest education level: High school graduate  Occupational History   Not on file  Tobacco Use   Smoking status: Every Day    Packs/day: 1.00    Pack years: 0.00    Types: Cigarettes   Smokeless tobacco: Never  Substance and Sexual Activity   Alcohol use: No   Drug use: No   Sexual activity: Never    Birth control/protection: Abstinence  Other Topics Concern   Not on file  Social History Narrative   Not on file   Social Determinants of Health   Financial Resource Strain: Not on file  Food Insecurity: Not on file  Transportation Needs: Not on file  Physical Activity: Not on file  Stress: Not on file  Social Connections: Not on file   SDOH:  SDOH Screenings   Alcohol Screen: Low Risk    Last Alcohol Screening Score (AUDIT): 0  Depression (PHQ2-9): Medium Risk   PHQ-2 Score: 27  Financial Resource Strain: Not on file  Food Insecurity: Not on file  Housing: Not on file  Physical Activity: Not on file  Social Connections: Not on file  Stress: Not on file  Tobacco Use: High Risk   Smoking Tobacco Use: Every Day   Smokeless Tobacco Use: Never  Transportation Needs: Not on file    Tobacco Cessation:  A prescription for an FDA-approved tobacco cessation medication provided at discharge  Current Medications:  Current Facility-Administered Medications  Medication Dose Route Frequency Provider Last Rate Last Admin   acetaminophen (TYLENOL) tablet 650 mg  650 mg Oral Q6H PRN Lenard Lance, [name]       ALPRAZolam Prudy Feeler) tablet 0.5 mg  0.5 mg Oral BID Lenard Lance, [name]   0.5 mg at 02/26/21 0943   alum & mag hydroxide-simeth (MAALOX/MYLANTA) 200-200-20 MG/5ML suspension 30 mL  30 mL Oral Q4H PRN Lenard Lance, [name]       ARIPiprazole (ABILIFY) tablet 5 mg  5 mg Oral  Daily Lenard Lance, [name]   5 mg at 02/26/21 0944   hydrOXYzine (ATARAX/VISTARIL) tablet 25 mg  25 mg Oral BID PRN Lenard Lance, [name]       levothyroxine (SYNTHROID) tablet 25 mcg  25 mcg Oral Q0600 Lenard Lance, [name]   25 mcg at 02/26/21 0552   magnesium hydroxide (MILK OF MAGNESIA) suspension 30 mL  30 mL Oral Daily PRN Lenard Lance, [name]       nicotine (NICODERM CQ - dosed in mg/24 hours) patch 21 mg  21 mg Transdermal Daily Lenard Lance, [name]   21 mg at 02/26/21 0947   OXcarbazepine (TRILEPTAL) tablet 150 mg  150 mg Oral BID Lenard Lance, [name]   150 mg at 02/26/21 0943   sertraline (ZOLOFT) tablet 150 mg  150 mg Oral Daily Lenard Lance, [name]   150 mg at 02/26/21 0943   tamsulosin (FLOMAX) capsule 0.4 mg  0.4 mg Oral Daily Lenard Lance, [name]   0.4 mg at 02/26/21 0944   traZODone (DESYREL) tablet 50 mg  50 mg Oral QHS PRN Lenard Lance, [name]       Current Outpatient Medications  Medication Sig Dispense Refill   ALPRAZolam (XANAX) 0.5 MG tablet Take 1 tablet (0.5 mg total) by mouth 2 (two) times daily. For anxiety 6 tablet 0   ARIPiprazole (ABILIFY) 5 MG tablet Take 1 tablet (5 mg total) by mouth daily. For mood control 30 tablet 0   docusate sodium (COLACE) 100 MG capsule Take 1 capsule (100 mg total) by mouth daily. (May buy from over the counter): For constipation (Patient not taking: No sig reported) 1 capsule 0   famotidine (PEPCID) 20 MG tablet Take 1 tablet (20 mg total) by mouth at bedtime. For acid reflux (Patient not taking: No sig reported) 60 tablet 0   hydrOXYzine (ATARAX/VISTARIL) 25 MG tablet Take 1 tablet (25 mg total) by mouth 2 (two) times daily as needed for anxiety. 75 tablet 0   levothyroxine (SYNTHROID) 25 MCG tablet Take 1 tablet (25 mcg total) by mouth daily at 6 (six) AM. For low functioning thyroid gland 15 tablet 0   Multiple Vitamin (MULTIVITAMIN WITH MINERALS) TABS tablet Take 1 tablet by mouth daily. (May buy from over the counter): Nutritional supplement (Patient not  taking: No sig reported) 1 tablet 0   naproxen sodium (ALEVE) 220 MG tablet Take 220-440 mg by mouth 2 (two) times daily as needed (for pain or headaches).     nicotine (NICODERM CQ - DOSED IN MG/24 HOURS) 21 mg/24hr patch Place 1 patch (21 mg total) onto the skin daily. (May buy from over the counter): For smoking cessation 1 patch 0   OXcarbazepine (TRILEPTAL) 150 MG tablet Take 1 tablet (150 mg total) by mouth 2 (two) times daily. For mood stabilization 60 tablet 0   polyethylene glycol (MIRALAX / GLYCOLAX) 17 g packet Take 17 g by mouth daily. (May buy from over the counter): For constipation (Patient taking differently: Take 17 g by mouth daily as needed for moderate constipation.) 1 each 0   sertraline (ZOLOFT) 50 MG tablet Take 3 tablets (150 mg total) by mouth daily. For depression 150 tablet 0   silodosin (RAPAFLO) 8 MG CAPS capsule Take 8 mg by mouth daily with breakfast.     tamsulosin (FLOMAX) 0.4 MG CAPS capsule Take 1 capsule (0.4 mg total) by mouth daily. For Prostate health (Patient not taking: No sig reported) 15 capsule 0   traZODone (DESYREL) 50 MG tablet Take 1 tablet (50 mg total) by mouth at bedtime as needed for sleep. (Patient not taking: No sig reported) 30 tablet 0    PTA Medications: (Not in a hospital admission)   Musculoskeletal  Strength & Muscle Tone: within normal limits Gait & Station: normal Patient leans: N/A  Psychiatric Specialty Exam  Presentation  General Appearance: Appropriate for Environment; Casual  Eye Contact:Good  Speech:Clear and Coherent; Normal Rate  Speech Volume:Normal  Handedness:Right   Mood and Affect  Mood:Euthymic  Affect:Appropriate; Congruent   Thought Process  Thought Processes:Coherent; Goal Directed  Descriptions of Associations:Intact  Orientation:Full (Time, Place and Person)  Thought Content:Logical; WDL  Diagnosis of Schizophrenia or Schizoaffective disorder in past: No    Hallucinations:Hallucinations:  None  Ideas of Reference:None  Suicidal Thoughts:Suicidal  Thoughts: No SI Passive Intent and/or Plan: With Intent; Without Plan; With Means to Carry Out; With Access to Means  Homicidal Thoughts:Homicidal Thoughts: No   Sensorium  Memory:Immediate Good; Recent Good; Remote Good  Judgment:Good  Insight:Fair   Executive Functions  Concentration:Good  Attention Span:Good  Recall:Good  Fund of Knowledge:Good  Language:Good   Psychomotor Activity  Psychomotor Activity:Psychomotor Activity: Normal   Assets  Assets:Communication Skills; Desire for Improvement; Financial Resources/Insurance; Intimacy; Leisure Time; Physical Health; Resilience; Social Support; Talents/Skills   Sleep  Sleep:Sleep: Fair Number of Hours of Sleep: 5   Nutritional Assessment (For OBS and FBC admissions only) Has the patient had a weight loss or gain of 10 pounds or more in the last 3 months?: No Has the patient had a decrease in food intake/or appetite?: No Does the patient have dental problems?: No Does the patient have eating habits or behaviors that may be indicators of an eating disorder including binging or inducing vomiting?: No Has the patient recently lost weight without trying?: No Has the patient been eating poorly because of a decreased appetite?: No Malnutrition Screening Tool Score: 0    Physical Exam  Physical Exam Vitals and nursing note reviewed.  Constitutional:      Appearance: Normal appearance. He is well-developed and normal weight.  HENT:     Head: Normocephalic and atraumatic.     Nose: Nose normal.  Cardiovascular:     Rate and Rhythm: Normal rate.  Pulmonary:     Effort: Pulmonary effort is normal.  Musculoskeletal:        General: Normal range of motion.     Cervical back: Normal range of motion.  Neurological:     Mental Status: He is alert and oriented to person, place, and time.  Psychiatric:        Attention and Perception: Attention and  perception normal.        Mood and Affect: Mood and affect normal.        Speech: Speech normal.        Behavior: Behavior normal. Behavior is cooperative.        Thought Content: Thought content normal.        Cognition and Memory: Cognition and memory normal.        Judgment: Judgment normal.   Review of Systems  Constitutional: Negative.   HENT: Negative.    Eyes: Negative.   Respiratory: Negative.    Cardiovascular: Negative.   Gastrointestinal: Negative.   Genitourinary: Negative.   Musculoskeletal: Negative.   Skin: Negative.   Neurological: Negative.   Endo/Heme/Allergies: Negative.   Psychiatric/Behavioral: Negative.    Blood pressure 101/73, pulse 69, temperature 98.3 F (36.8 C), temperature source Oral, resp. rate 18, SpO2 98 %. There is no height or weight on file to calculate BMI.  Demographic Factors:  Male and Caucasian  Loss Factors: Financial problems/change in socioeconomic status  Historical Factors: Prior suicide attempts  Risk Reduction Factors:   Positive social support, Positive therapeutic relationship, and Positive coping skills or problem solving skills  Continued Clinical Symptoms:  Alcohol/Substance Abuse/Dependencies Previous Psychiatric Diagnoses and Treatments  Cognitive Features That Contribute To Risk:  None    Suicide Risk:  Minimal: No identifiable suicidal ideation.  Patients presenting with no risk factors but with morbid ruminations; may be classified as minimal risk based on the severity of the depressive symptoms  Plan Of Care/Follow-up recommendations:  Patient reviewed with Dr. Lucianne Muss. Follow-up with established outpatient psychiatry. Follow-up with counseling at [facility]  behavioral health. Continue current medications.  Disposition: Discharge  Lenard Lance, [name] 02/26/2021, 11:41 AM

## 2021-02-26 NOTE — ED Notes (Signed)
Pt asleep in bed. Respirations even and unlabored. Will continue to monitor for safety. ?

## 2021-02-26 NOTE — ED Notes (Signed)
Pt A&) x3. Calm, cooperative with staff. Continue to endorse SI no plan this am. Pt states, "I don't have any power in my home and on June 24th, they are threatening to kick me out of my home so I'll be homeless. I'm just feeling helpless and hopeless right now. Plus, my depression medicine (Zoloft) isn't working anymore. I need my meds changed". Encouragement and support given. Pt contracts for safety. Will continue to monitor for safety.

## 2021-03-21 ENCOUNTER — Other Ambulatory Visit: Payer: Self-pay

## 2021-03-21 ENCOUNTER — Ambulatory Visit (HOSPITAL_COMMUNITY)
Admission: EM | Admit: 2021-03-21 | Discharge: 2021-03-21 | Disposition: A | Discharge status: Home / Self Care | Payer: No Payment, Other | Attending: Psychiatry | Admitting: Psychiatry

## 2021-03-21 DIAGNOSIS — F332 Major depressive disorder, recurrent severe without psychotic features: Secondary | ICD-10-CM | POA: Insufficient documentation

## 2021-03-21 NOTE — BH Assessment (Signed)
Triage Note: (Routine)   55 year old male recently inpatient at [facility] (3 weeks ago) came to [facility] voluntary via [facility] requesting a change in his medication. Patient report his anti-depressants stopped working. Report he has been on the same medication the past 3-5 years. Patient report he feels lost, confused, and disoriented. He sees Dr. Evelene Croon psychiatrist reporting he has an appointment with next Friday March 27, 2021. Patient denied suicidal/homicidal ideations and denied auditory/visual hallucinations. Patient able to contract for safety.  Disposition: Patice, NP, patient psych-cleared with resources

## 2021-03-21 NOTE — ED Notes (Signed)
Reviewed AVS with patient.  Patient verbalized understanding and stated that he felt safe and ready to discharge.  Patient discharged ambulatory in stable condition; no acute distress noted.

## 2021-03-21 NOTE — Discharge Instructions (Addendum)

## 2021-03-21 NOTE — ED Provider Notes (Addendum)
Behavioral Health Urgent Care Medical Screening Exam  Patient Name: Christopher Giles MRN: [id number] Date of Evaluation: 03/21/21 Chief Complaint:   Diagnosis:  Final diagnoses:  Severe episode of recurrent major depressive disorder, without psychotic features (HCC)    History of Present illness: Christopher Giles is a 55 y.o. male patient who presents to the [facility] voluntarily as a walk-in via law enforcement with a chief complaint of "I need to change my medications."   Patient reports that he needs to change his medications because they are not working. He states that his anxiety medication is working. He reports taking Xanax. He reports taking Zoloft, Abilify, and Trileptal. He states that he's been taking those medications for 3 to 5 years and they are no longer working. He states "I have to speak up for myself because it's my life." He states that because the medications are not working, they are causing him to not be able to function. When asked to elaborate on not functioning, he states that he is not able to take care of his hygiene eat work or clean. When asked if he can physically take care of himself, he states he can. When asked if it is more related to decreased energy level, he states yes, I have no energy to do any of those things. He states "I am existing not living." When asked if his medications were changed while he was hospitalized last month at [facility], he states "they gave me the same medications because I was out of medications. I had no relief. I left out the same way. No improvement." He states that he sees Dr. Evelene Croon for outpatient psychiatry. He states that his next appointment with Dr. Evelene Croon is on Friday, July 15th. He states that he does not know if he will have enough money to see Dr. Evelene Croon because he is not working and has to rely on his brother for the money. He states that he has not seen Dr. since feeling this  way.  Towards the end of the assessment he states that he called the police to be transported here because he was afraid that he might do something. When asked if he felt safe to return home, he states "not really. I am in the same boat as when I left [facility]."  When asked if he's having suicidal thoughts, he states "yes, thoughts in my head. I won't hurt myself."  He states that he is afraid to go home. He was offered to stay at the [facility] for overnight observation. This provider reviewed the PDMP database and the patient last filled a prescription for  Alprazolam 1 mg on 02/17/21. This provider explained to the patient while he is here for overnight observation he would be restarted on his current psychotropics, except for the Xanax because he does not have an active prescription on file. He states that he would like to go home and what is the point of staying here if he cannot get his medications changed. This provider explained to the patient that if he does not feel safe returning home he would be able to stay overnight for safety. He states that he does feel safe to return home and is able to contract for safety.  On approach, the patient is alert and oriented x 4. Patient answers questions appropriately. Patient thought process is logical, speech coherent and tone is normal/decreased. Patient is causally dressed. Patient endorses passive suicidal thoughts with no plan or  intent. He states "I don't want to hurt myself."  Patient denies self harm behaviors. Patient denies homicidal ideations. Patient denies AVH. Patient does not appear to be responding to internal, or external stimuli.   Per chart review: Patient was seen at the [facility] on 6/15 and [facility] on 02/15/21 for similar presentations.  Psychiatric Specialty Exam  Presentation  General Appearance:Casual  Eye Contact:Fair  Speech:Clear and Coherent  Speech Volume:Normal  Handedness:Right   Mood and Affect   Mood:Dysphoric  Affect:Congruent   Thought Process  Thought Processes:Coherent  Descriptions of Associations:Intact  Orientation:Full (Time, Place and Person)  Thought Content:WDL  Diagnosis of Schizophrenia or Schizoaffective disorder in past: No   Hallucinations:None  Ideas of Reference:None  Suicidal Thoughts:Yes, Passive Without Intent; Without Plan; Without Means to Carry Out; Without Access to Means Without Intent; Without Plan  Homicidal Thoughts:No   Sensorium  Memory:Immediate Fair; Recent Fair; Remote Fair  Judgment:Fair  Insight:Fair   Executive Functions  Concentration:Fair  Attention Span:Fair  Recall:Fair  Fund of Knowledge:Fair  Language:Fair   Psychomotor Activity  Psychomotor Activity:Normal   Assets  Assets:Communication Skills; Desire for Improvement; Leisure Time; Physical Health; Social Support   Sleep  Sleep:Poor  Number of hours: 5   No data recorded  Physical Exam: Physical Exam HENT:     Head: Normocephalic.     Nose: Nose normal.  Eyes:     Conjunctiva/sclera: Conjunctivae normal.  Musculoskeletal:        General: Normal range of motion.  Neurological:     General: No focal deficit present.     Mental Status: He is alert and oriented to person, place, and time.  Psychiatric:        Attention and Perception: Attention normal.        Mood and Affect: Mood is depressed. Affect is flat.        Speech: Speech normal.        Behavior: Behavior is cooperative.   Review of Systems  Constitutional: Negative.   HENT: Negative.    Gastrointestinal: Negative.   Genitourinary: Negative.   Musculoskeletal: Negative.   Skin: Negative.   Neurological: Negative.   Endo/Heme/Allergies: Negative.   Psychiatric/Behavioral:  Positive for depression and suicidal ideas. Negative for hallucinations and substance abuse.        Passive SI no plan or intent. Patient verbally contracts for safety.   Blood pressure 110/75, pulse  76, temperature 98.4 F (36.9 C), temperature source Temporal, resp. rate 18, SpO2 96 %. There is no height or weight on file to calculate BMI.  Musculoskeletal: Strength & Muscle Tone: within normal limits Gait & Station: normal Patient leans: N/A   [facility] MSE Discharge Disposition for Follow up and Recommendations: Based on my evaluation the patient does not appear to have an emergency medical condition and can be discharged with resources and follow up care in outpatient services for Medication Management, Individual Therapy, and Group Therapy  Follow up with current outpatient psychiatrist "Dr. Evelene Croon on March 27, 2021" for medication management.   Take all of you medications as prescribed by your mental healthcare provider.  Report any adverse effects and reactions from your medications to your outpatient provider promptly.  Do not engage in alcohol and or illegal drug use while on prescription medicines.  Keep all scheduled appointments. This is to ensure that you are getting refills on time and to avoid any interruption in your medication.  If you are unable to keep an appointment call to reschedule.  Be sure  to follow up with resources and follow ups given.  In the event of worsening symptoms call the crisis hotline, 911, and or go to the nearest emergency department for appropriate evaluation and treatment of symptoms. Follow-up with your primary care provider for your medical issues, concerns and or health care needs.      Rinaldo Macqueen L, NP 03/21/2021, 2:07 PM

## 2021-04-02 ENCOUNTER — Ambulatory Visit (HOSPITAL_COMMUNITY): Payer: No Payment, Other | Admitting: Physician Assistant

## 2021-04-02 ENCOUNTER — Encounter (HOSPITAL_COMMUNITY): Payer: Self-pay

## 2021-04-02 ENCOUNTER — Emergency Department (HOSPITAL_COMMUNITY)
Admission: EM | Admit: 2021-04-02 | Discharge: 2021-04-03 | Disposition: A | Discharge status: Other Acute Inpatient Hospital | Payer: Self-pay | Attending: Emergency Medicine | Admitting: Emergency Medicine

## 2021-04-02 ENCOUNTER — Other Ambulatory Visit: Payer: Self-pay

## 2021-04-02 ENCOUNTER — Encounter (HOSPITAL_COMMUNITY): Payer: Self-pay | Admitting: Physician Assistant

## 2021-04-02 DIAGNOSIS — F17210 Nicotine dependence, cigarettes, uncomplicated: Secondary | ICD-10-CM | POA: Insufficient documentation

## 2021-04-02 DIAGNOSIS — Z79899 Other long term (current) drug therapy: Secondary | ICD-10-CM | POA: Insufficient documentation

## 2021-04-02 DIAGNOSIS — Y900 Blood alcohol level of less than 20 mg/100 ml: Secondary | ICD-10-CM | POA: Insufficient documentation

## 2021-04-02 DIAGNOSIS — R45851 Suicidal ideations: Secondary | ICD-10-CM

## 2021-04-02 DIAGNOSIS — F331 Major depressive disorder, recurrent, moderate: Secondary | ICD-10-CM | POA: Insufficient documentation

## 2021-04-02 DIAGNOSIS — Z20822 Contact with and (suspected) exposure to covid-19: Secondary | ICD-10-CM | POA: Insufficient documentation

## 2021-04-02 LAB — CBC
HCT: 46.8 % (ref 39.0–52.0)
Hemoglobin: 15.5 g/dL (ref 13.0–17.0)
MCH: 31.4 pg (ref 26.0–34.0)
MCHC: 33.1 g/dL (ref 30.0–36.0)
MCV: 94.9 fL (ref 80.0–100.0)
Platelets: 320 10*3/uL (ref 150–400)
RBC: 4.93 MIL/uL (ref 4.22–5.81)
RDW: 13.1 % (ref 11.5–15.5)
WBC: 6.8 10*3/uL (ref 4.0–10.5)
nRBC: 0 % (ref 0.0–0.2)

## 2021-04-02 LAB — COMPREHENSIVE METABOLIC PANEL
ALT: 14 U/L (ref 0–44)
AST: 19 U/L (ref 15–41)
Albumin: 4.5 g/dL (ref 3.5–5.0)
Alkaline Phosphatase: 75 U/L (ref 38–126)
Anion gap: 10 (ref 5–15)
BUN: 10 mg/dL (ref 6–20)
CO2: 27 mmol/L (ref 22–32)
Calcium: 9.8 mg/dL (ref 8.9–10.3)
Chloride: 101 mmol/L (ref 98–111)
Creatinine, Ser: 0.92 mg/dL (ref 0.61–1.24)
GFR, Estimated: >60 mL/min (ref >60)
Glucose, Bld: 106 mg/dL — ABNORMAL HIGH (ref 70–99)
Potassium: 3.6 mmol/L (ref 3.5–5.1)
Sodium: 138 mmol/L (ref 135–145)
Total Bilirubin: 0.6 mg/dL (ref 0.3–1.2)
Total Protein: 7.4 g/dL (ref 6.5–8.1)

## 2021-04-02 LAB — ACETAMINOPHEN LEVEL: Acetaminophen (Tylenol), Serum: <10 ug/mL — ABNORMAL LOW (ref 10–30)

## 2021-04-02 LAB — RAPID URINE DRUG SCREEN, HOSP PERFORMED
Amphetamines: NOT DETECTED
Barbiturates: NOT DETECTED
Benzodiazepines: NOT DETECTED
Cocaine: NOT DETECTED
Opiates: NOT DETECTED
Tetrahydrocannabinol: NOT DETECTED

## 2021-04-02 LAB — SALICYLATE LEVEL: Salicylate Lvl: <7 mg/dL — ABNORMAL LOW (ref 7.0–30.0)

## 2021-04-02 LAB — ETHANOL: Alcohol, Ethyl (B): <10 mg/dL (ref <10)

## 2021-04-02 NOTE — ED Provider Notes (Addendum)
Emergency Medicine Provider Triage Evaluation Note  Christopher Giles , a 55 y.o. male  was evaluated in triage.  Pt complains of suicidal ideation.  Patient endorses suicidal ideations over the last 2 days.  Patient has plan to cut his wrist.  Patient states he has access to knives at home.  Patient states he attempted suicide  a few months prior by overdosing on pills.  Patient denies any attempt to overdose via pills in the last few days.  Denies any HI, auditory hallucinations, or visual hallucinations.  Patient states that he has not been on any of his psychiatric medications in the week due to running out of the medications.  Patient states that prior to listing medications would not worsen.  Patient was supposed to follow-up with psychiatrist Dr.Kaur on the 15th of this month for medication management however was unable to.  Denies any illicit drug use or alcohol use.  Review of Systems  Positive: SI Negative: HI,AVH  Physical Exam  BP 110/84   Pulse 71   Temp 98.5 F (36.9 C) (Oral)   Resp 14   Ht 6\' 3"  (1.905 m)   Wt 83.9 kg   SpO2 98%   BMI 23.12 kg/m  Gen:   Awake, no distress   Resp:  Normal effort  MSK:   Moves extremities without difficulty  Other:    Medical Decision Making  Medically screening exam initiated at 11:25 AM.  Appropriate orders placed.  was informed that the remainder of the evaluation will be completed by another provider, this initial triage assessment does not replace that evaluation, and the importance of remaining in the ED until their evaluation is complete.  Patient is voluntary at this time.  If patient attempts to leave may require IVC.  Medical clearance labs ordered.  Patient will be held in triage area until room is available.     [name], PA-C 04/02/21 [address] PA-C 04/02/21 1129    T[address], 04/04/21, MD 04/02/21 [phone]

## 2021-04-02 NOTE — Discharge Instructions (Signed)
Transfer to [facility]

## 2021-04-02 NOTE — ED Triage Notes (Signed)
Pt here POV with c/o of being SI. Pt states his meds are not working. Plans to cut wrists. Pt reports family is supportive.

## 2021-04-02 NOTE — Progress Notes (Unsigned)
Psychiatric Initial Adult Assessment   Patient Identification: Christopher Giles MRN:  [id number] Date of Evaluation:  04/02/2021 Referral Source: *** Chief Complaint:   Chief Complaint   Medication Management    Visit Diagnosis: No diagnosis found.  History of Present Illness:  ***  Associated Signs/Symptoms: Depression Symptoms:  {DEPRESSION SYMPTOMS:20000} (Hypo) Manic Symptoms:  {BHH MANIC SYMPTOMS:22872} Anxiety Symptoms:  {BHH ANXIETY SYMPTOMS:22873} Psychotic Symptoms:  {BHH PSYCHOTIC SYMPTOMS:22874} PTSD Symptoms: {BHH PTSD SYMPTOMS:22875}  Past Psychiatric History: ***  Previous Psychotropic Medications: {YES/NO:21197}  Substance Abuse History in the last 12 months:  {yes no:314532}  Consequences of Substance Abuse: {BHH CONSEQUENCES OF SUBSTANCE ABUSE:22880}  Past Medical History:  Past Medical History:  Diagnosis Date   Anxiety    Depression    Moderate cocaine use disorder (HCC) 02/09/2021   Substance abuse (HCC)    Suicide attempt (HCC)    x 6 per pt   Thyroid disease    hypo    Past Surgical History:  Procedure Laterality Date   punctured lung      Family Psychiatric History: ***  Family History:  Family History  Problem Relation Age of Onset   Hyperlipidemia Mother    Cancer Mother    Cancer Father    Diabetes Maternal Grandmother     Social History:   Social History   Socioeconomic History   Marital status: Single    Spouse name: Not on file   Number of children: Not on file   Years of education: 12   Highest education level: High school graduate  Occupational History   Not on file  Tobacco Use   Smoking status: Every Day    Packs/day: 1.00    Types: Cigarettes   Smokeless tobacco: Never  Substance and Sexual Activity   Alcohol use: No   Drug use: No   Sexual activity: Never    Birth control/protection: Abstinence  Other Topics Concern   Not on file  Social History Narrative   Not on file   Social Determinants of Health    Financial Resource Strain: Not on file  Food Insecurity: Not on file  Transportation Needs: Not on file  Physical Activity: Not on file  Stress: Not on file  Social Connections: Not on file    Additional Social History: ***  Allergies:  No Known Allergies  Metabolic Disorder Labs: Lab Results  Component Value Date   HGBA1C 5.6 02/10/2021   MPG 114 02/10/2021   No results found for: PROLACTIN Lab Results  Component Value Date   CHOL 163 02/10/2021   TRIG 409 (H) 02/10/2021   HDL 34 (L) 02/10/2021   CHOLHDL 4.8 02/10/2021   VLDL UNABLE TO CALCULATE IF TRIGLYCERIDE OVER 400 mg/dL 50/05/3817   LDLCALC UNABLE TO CALCULATE IF TRIGLYCERIDE OVER 400 mg/dL 29/93/7169   Lab Results  Component Value Date   TSH 4.672 (H) 02/10/2021    Therapeutic Level Labs: Lab Results  Component Value Date   LITHIUM <0.06 (L) 07/15/2016   No results found for: CBMZ No results found for: VALPROATE  Current Medications: Current Outpatient Medications  Medication Sig Dispense Refill   ALPRAZolam (XANAX) 0.5 MG tablet Take 1 tablet (0.5 mg total) by mouth 2 (two) times daily. For anxiety 6 tablet 0   ARIPiprazole (ABILIFY) 5 MG tablet Take 1 tablet (5 mg total) by mouth daily. For mood control 30 tablet 0   docusate sodium (COLACE) 100 MG capsule Take 1 capsule (100 mg total) by mouth daily. (May buy  from over the counter): For constipation (Patient not taking: No sig reported) 1 capsule 0   famotidine (PEPCID) 20 MG tablet Take 1 tablet (20 mg total) by mouth at bedtime. For acid reflux (Patient not taking: No sig reported) 60 tablet 0   hydrOXYzine (ATARAX/VISTARIL) 25 MG tablet Take 1 tablet (25 mg total) by mouth 2 (two) times daily as needed for anxiety. 75 tablet 0   levothyroxine (SYNTHROID) 25 MCG tablet Take 1 tablet (25 mcg total) by mouth daily at 6 (six) AM. For low functioning thyroid gland 15 tablet 0   Multiple Vitamin (MULTIVITAMIN WITH MINERALS) TABS tablet Take 1 tablet by  mouth daily. (May buy from over the counter): Nutritional supplement (Patient not taking: No sig reported) 1 tablet 0   naproxen sodium (ALEVE) 220 MG tablet Take 220-440 mg by mouth 2 (two) times daily as needed (for pain or headaches).     nicotine (NICODERM CQ - DOSED IN MG/24 HOURS) 21 mg/24hr patch Place 1 patch (21 mg total) onto the skin daily. (May buy from over the counter): For smoking cessation 1 patch 0   OXcarbazepine (TRILEPTAL) 150 MG tablet Take 1 tablet (150 mg total) by mouth 2 (two) times daily. For mood stabilization 60 tablet 0   polyethylene glycol (MIRALAX / GLYCOLAX) 17 g packet Take 17 g by mouth daily. (May buy from over the counter): For constipation (Patient taking differently: Take 17 g by mouth daily as needed for moderate constipation.) 1 each 0   sertraline (ZOLOFT) 50 MG tablet Take 3 tablets (150 mg total) by mouth daily. For depression 150 tablet 0   silodosin (RAPAFLO) 8 MG CAPS capsule Take 8 mg by mouth daily with breakfast.     tamsulosin (FLOMAX) 0.4 MG CAPS capsule Take 1 capsule (0.4 mg total) by mouth daily. For Prostate health (Patient not taking: No sig reported) 15 capsule 0   traZODone (DESYREL) 50 MG tablet Take 1 tablet (50 mg total) by mouth at bedtime as needed for sleep. (Patient not taking: No sig reported) 30 tablet 0   No current facility-administered medications for this visit.    Musculoskeletal: Strength & Muscle Tone: within normal limits Gait & Station: normal Patient leans: N/A  Psychiatric Specialty Exam: Review of Systems  Psychiatric/Behavioral:  Positive for dysphoric mood. The patient is nervous/anxious.    Blood pressure 108/78, pulse 67, height 6\' 3"  (1.905 m), weight 183 lb (83 kg), SpO2 99 %.Body mass index is 22.87 kg/m.  General Appearance: Fairly Groomed  Eye Contact:  Good  Speech:  {Speech:22685}  Volume:  {Volume (PAA):22686}  Mood:  {BHH MOOD:22306}  Affect:  {Affect (PAA):22687}  Thought Process:  {Thought  Process (PAA):22688}  Orientation:  {BHH ORIENTATION (PAA):22689}  Thought Content:  {Thought Content:22690}  Suicidal Thoughts:  {ST/HT (PAA):22692}  Homicidal Thoughts:  {ST/HT (PAA):22692}  Memory:  {BHH MEMORY:22881}  Judgement:  {Judgement (PAA):22694}  Insight:  {Insight (PAA):22695}  Psychomotor Activity:  {Psychomotor (PAA):22696}  Concentration:  {Concentration:21399}  Recall:  {BHH GOOD/FAIR/POOR:22877}  Fund of Knowledge:{BHH GOOD/FAIR/POOR:22877}  Language: {BHH GOOD/FAIR/POOR:22877}  Akathisia:  {BHH YES OR NO:22294}  Handed:  {Handed:22697}  AIMS (if indicated):  {Desc; done/not:10129}  Assets:  {Assets (PAA):22698}  ADL's:  {BHH  Cognition: {chl bhh cognition:304700322}  Sleep:  {BHH GOOD/FAIR/POOR:22877}   Screenings: AIMS    Flowsheet Row [facility]) [facility] Ad[facility] ([facility]  AIMS Total Score 0 0 0      AUDIT    Flowsheet Row [facility]) [facility] ([facility] Ad[facility] ([facility]  Alcohol Use Disorder Identification Test Final Score (AUDIT) 0 0 0      PHQ2-9    Flowsheet Row ED from 02/24/2021 in [facility]  PHQ-2 Total Score 6  PHQ-9 Total Score 27      Flowsheet Row ED from 02/25/2021 in [facility] ED from 02/24/2021 in [facility] Admission (Discharged[facility]  C-SSRS RISK CATEGORY Error: Q7 should not be populated when Q6 is No High Risk Error: Q7 should not be  populated when Q6 is No       Assessment and Plan: ***   Meta Hatchet, PA 7/21/20228:45 AM

## 2021-04-02 NOTE — BH Assessment (Signed)
Comprehensive Clinical Assessment (CCA) Screening, Triage and Referral Note  04/02/2021 MAESON PUROHIT [id number]  DISPOSITION: per NP, Nira Conn, pt can be monitored overnight at [facility] after neg COVID test and medical clearance. Transfer approved by Cecilio Asper, NP at [facility]. RN Juluis Rainier advised via SecureChat and she was asked to advised the EDP.   The patient demonstrates the following risk factors for suicide: Chronic risk factors for suicide include: psychiatric disorder of MDD and GAD, substance use disorder, and previous suicide attempts a few months ago . Acute risk factors for suicide include:  health issues . Protective factors for this patient include: hope for the future. Considering these factors, the overall suicide risk at this point appears to be moderate. Patient is appropriate for outpatient follow up.  Flowsheet Row ED from 04/02/2021 in [facility] EMERGENCY DEPARTMENT ED from 02/25/2021 in [facility] ED from 02/24/2021 in [facility] H[facility]-EMERGENCY DEPT  C-SSRS RISK CATEGORY High Risk Error: Q7 should not be populated when Q6 is No High Risk       Pt stated he came into the ED today because his mental health medications " are not working." He later stated that he has not been taking any prescribed medications for at least a week and had been "rationing" his medication before that time.  Pt was admitted IP at [facility] 3 months ago following s suicide attempt via intentional overdose. Pt stated that he has had significant "stomach issues" for about 3 months and that is about the time he believes his medication stopped working and he started to get depressed. Pt stated he has lost about 50 lbs. in the last 3-4 months. Pt was seen at [facility] on 7/9 and observed overnight with a medication restart at that time. Pt now states that he needs his medications changed because "these aren't working." Pt reported SI with a plan to cut his  wrists but stated that he has hope that "[facility] can help change his medications." Pt has been seeing Dr. Evelene Croon but stated he did not keep an appointment earlier in July because he does not have the money to pay for the appointment. Pt stated that he is "on leave" and out of work due to medical issues. Pt denies HI, NSSH, AVH and substance use. Prior suicide attempt reported a few months ago via intentional overdose on pills. Pt stated that he has a significant hx of substance abuse but is currently not using any substances. UDS and ETOH were negative in the ED.  Patient was of average stature, weight and build with disheveled grooming, reported poor hygiene and casual dress. Posture/gait, movement, concentration, and memory within normal limits. Normal attention and concentration and oriented to person, time, place and situation. Mood was blunted and affect was congruent with mood. Normal eye contact and responsive facial expressions. Patient was cooperative and a bit guarded although forthcoming with information when asked. Speech, thought content and organization was within normal limits. Appeared to have average intelligence with poor judgment and insight but within normal limits for age.    Chief Complaint:  Chief Complaint  Patient presents with   Suicidal   Visit Diagnosis:  MDD, Recurrent, Moderate  Patient Reported Information How did you hear about [place]? Self  What Is the Reason for Your Visit/Call Today? Pt stated he came into the ED today because his mental health medications " are not working." He later stated that he has not been taking any prescribed  medications for at least a week and had been "rationing" his medication before that time. Pt stated that he has had significant "stomach issues" for about 3 months and that is about the time he believes his medication stopped working and he started to get depressed. Pt was seen at [facility] on 7/9 and observed overnight with a medication restart at  that time. Pt now states that he needs his medications changed because "these aren't working." Pt reported SI with a plan to cut his wrists but stated that he ahs hope that "[facility] can help change his medications." Pt has been seeing Dr. Evelene Croon but stated he did not keep an appointment earlier in July because he does not have the money to pay for the appointment. Pt denies HI, NSSH, AVH and substance use. Prior suicide attempt reported a few months ago via intentional overdose on pills.  How Long Has This Been Causing You Problems? 1-6 months  What Do You Feel Would Help You the Most Today? Medication(s)   Have You Recently Had Any Thoughts About Hurting Yourself? Yes  Are You Planning to Commit Suicide/Harm Yourself At This time? Yes   Have you Recently Had Thoughts About Hurting Someone Karolee Ohs? No  Are You Planning to Harm Someone at This Time? No  Explanation: No data recorded  Have You Used Any Alcohol or Drugs in the Past 24 Hours? No  How Long Ago Did You Use Drugs or Alcohol? No data recorded What Did You Use and How Much? No data recorded  Do You Currently Have a Therapist/Psychiatrist? No  Name of Therapist/Psychiatrist: Refusing to see Dr. Evelene Croon because he stated he does not have any money to pay for the appointment.   Have You Been Recently Discharged From Any Office Practice or Programs? No  Explanation of Discharge From Practice/Program: D/C from [facility] on 02/15/21    CCA Screening Triage Referral Assessment Type of Contact: Tele-Assessment  Telemedicine Service Delivery:   Is this Initial or Reassessment? Initial Assessment  Date Telepsych consult ordered in [facility]:  04/02/21  Time Telepsych consult ordered in [facility]:  2013  Location of Assessment: [facility] ED  Provider Location: [facility] Assessment Services   Collateral Involvement: none   Does Patient Have a Automotive engineer Guardian? No data recorded Name and Contact of Legal Guardian: No data recorded If Minor and  Not Living with Parent(s), Who has Custody? No data recorded Is CPS involved or ever been involved? -- (na)  Is APS involved or ever been involved? -- (na)   Patient Determined To Be At Risk for Harm To Self or Others Based on Review of Patient Reported Information or Presenting Complaint? Yes, for Self-Harm  Method: No data recorded Availability of Means: No data recorded Intent: No data recorded Notification Required: No data recorded Additional Information for Danger to Others Potential: No data recorded Additional Comments for Danger to Others Potential: No data recorded Are There Guns or Other Weapons in Your Home? No data recorded Types of Guns/Weapons: No data recorded Are These Weapons Safely Secured?                            No data recorded Who Could Verify You Are Able To Have These Secured: No data recorded Do You Have any Outstanding Charges, Pending Court Dates, Parole/Probation? No data recorded Contacted To Inform of Risk of Harm To Self or Others: Unable to Contact:   Does Patient Present under  Involuntary Commitment? No  IVC Papers Initial File Date: No data recorded  [place] of Residence: [place]   Patient Currently Receiving the Following Services: Medication Management; Individual Therapy   Determination of Need: Urgent (48 hours)   Options For Referral: [facility]   Discharge Disposition:     Carolanne Grumbling, Counselor  Ramil Edgington T. Jimmye Norman, MS, [facility], [facility] Triage Specialist [facility]

## 2021-04-02 NOTE — ED Notes (Signed)
Pt given sandwich bag and a coke

## 2021-04-02 NOTE — ED Provider Notes (Signed)
[facility] EMERGENCY DEPARTMENT Provider Note   CSN: [id number] Arrival date & time: 04/02/21  5462     History Chief Complaint  Patient presents with   Suicidal    Christopher Giles is a 55 y.o. male.  Patient states he is suicidal and he wants to cut his wrists  The history is provided by the patient and medical records. No language interpreter was used.  Altered Mental Status Presenting symptoms: behavior changes   Severity:  Severe Most recent episode:  2 days ago Episode history:  Continuous Timing:  Constant Progression:  Worsening Chronicity:  New Context: not alcohol use   Associated symptoms: no abdominal pain, no hallucinations, no headaches, no rash and no seizures       Past Medical History:  Diagnosis Date   Anxiety    Depression    Moderate cocaine use disorder (HCC) 02/09/2021   Substance abuse (HCC)    Suicide attempt (HCC)    x 6 per pt   Thyroid disease    hypo    Patient Active Problem List   Diagnosis Date Noted   Suicidal overdose (HCC) 02/25/2021   Cocaine use disorder, severe, dependence (HCC) 02/09/2021   MDD (major depressive disorder) 02/08/2021   Major depressive disorder, 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   Moderate episode of recurrent major depressive disorder (HCC)    Severe episode of recurrent major depressive disorder, without psychotic features (HCC)     Past Surgical History:  Procedure Laterality Date   punctured lung         Family History  Problem Relation Age of Onset   Hyperlipidemia Mother    Cancer Mother    Cancer Father    Diabetes Maternal Grandmother     Social History   Tobacco Use   Smoking status: Every Day    Packs/day: 1.00    Types: Cigarettes   Smokeless tobacco: Never  Substance Use Topics   Alcohol use: No   Drug use: No    Home Medications Prior to Admission medications   Medication Sig Start Date End Date Taking? Authorizing Provider   naproxen sodium (ALEVE) 220 MG tablet Take 220-440 mg by mouth 2 (two) times daily as needed (for pain or headaches).   Yes [provider]  ALPRAZolam (XANAX) 0.5 MG tablet Take 1 tablet (0.5 mg total) by mouth 2 (two) times daily. For anxiety Patient not taking: Reported on 04/02/2021 02/15/21   Armandina Stammer I, NP  ARIPiprazole (ABILIFY) 5 MG tablet Take 1 tablet (5 mg total) by mouth daily. For mood control Patient not taking: Reported on 04/02/2021 02/15/21   Armandina Stammer I, NP  docusate sodium (COLACE) 100 MG capsule Take 1 capsule (100 mg total) by mouth daily. (May buy from over the counter): For constipation Patient not taking: No sig reported 02/16/21   Armandina Stammer I, NP  famotidine (PEPCID) 20 MG tablet Take 1 tablet (20 mg total) by mouth at bedtime. For acid reflux Patient not taking: No sig reported 02/15/21   Armandina Stammer I, NP  hydrOXYzine (ATARAX/VISTARIL) 25 MG tablet Take 1 tablet (25 mg total) by mouth 2 (two) times daily as needed for anxiety. Patient not taking: Reported on 04/02/2021 02/15/21   Armandina Stammer I, NP  levothyroxine (SYNTHROID) 25 MCG tablet Take 1 tablet (25 mcg total) by mouth daily at 6 (six) AM. For low functioning thyroid gland Patient not taking: Reported on 04/02/2021 02/16/21   Sanjuana Kava, NP  Multiple Vitamin (MULTIVITAMIN WITH MINERALS) TABS tablet Take 1 tablet by mouth daily. (May buy from over the counter): Nutritional supplement Patient not taking: No sig reported 02/16/21   Armandina Stammer I, NP  nicotine (NICODERM CQ - DOSED IN MG/24 HOURS) 21 mg/24hr patch Place 1 patch (21 mg total) onto the skin daily. (May buy from over the counter): For smoking cessation Patient not taking: Reported on 04/02/2021 02/16/21   Armandina Stammer I, NP  OXcarbazepine (TRILEPTAL) 150 MG tablet Take 1 tablet (150 mg total) by mouth 2 (two) times daily. For mood stabilization Patient not taking: Reported on 04/02/2021 02/15/21   Armandina Stammer I, NP  polyethylene glycol (MIRALAX /  GLYCOLAX) 17 g packet Take 17 g by mouth daily. (May buy from over the counter): For constipation Patient taking differently: Take 17 g by mouth daily as needed for moderate constipation. 02/16/21   Armandina Stammer I, NP  sertraline (ZOLOFT) 50 MG tablet Take 3 tablets (150 mg total) by mouth daily. For depression Patient not taking: Reported on 04/02/2021 02/16/21   Armandina Stammer I, NP  silodosin (RAPAFLO) 8 MG CAPS capsule Take 8 mg by mouth daily with breakfast. Patient not taking: Reported on 04/02/2021    [provider]  tamsulosin (FLOMAX) 0.4 MG CAPS capsule Take 1 capsule (0.4 mg total) by mouth daily. For Prostate health Patient not taking: No sig reported 02/16/21   Armandina Stammer I, NP  traZODone (DESYREL) 50 MG tablet Take 1 tablet (50 mg total) by mouth at bedtime as needed for sleep. Patient not taking: No sig reported 02/15/21   Armandina Stammer I, NP  clonazePAM (KLONOPIN) 0.5 MG tablet Take 1 tablet (0.5 mg total) by mouth 2 (two) times daily. Patient not taking: Reported on 02/07/2021 07/28/16 02/07/21  Beau Fanny, [name]  pantoprazole (PROTONIX) 20 MG tablet Take 1 tablet (20 mg total) by mouth 2 (two) times daily. Patient not taking: No sig reported 11/11/20 02/07/21  Lorre Nick, MD  sucralfate (CARAFATE) 1 g tablet Take 1 tablet (1 g total) by mouth 4 (four) times daily. Patient not taking: Reported on 02/07/2021 11/11/20 02/07/21  Lorre Nick, MD  terazosin (HYTRIN) 1 MG capsule Take 1 mg by mouth at bedtime. Patient not taking: No sig reported  02/07/21  [provider]    Allergies    Patient has no known allergies.  Review of Systems   Review of Systems  Constitutional:  Negative for appetite change and fatigue.  HENT:  Negative for congestion, ear discharge and sinus pressure.   Eyes:  Negative for discharge.  Respiratory:  Negative for cough.   Cardiovascular:  Negative for chest pain.  Gastrointestinal:  Negative for abdominal pain and diarrhea.   Genitourinary:  Negative for frequency and hematuria.  Musculoskeletal:  Negative for back pain.  Skin:  Negative for rash.  Neurological:  Negative for seizures and headaches.  Psychiatric/Behavioral:  Positive for dysphoric mood. Negative for hallucinations.    Physical Exam Updated Vital Signs BP 91/64 (BP Location: Right Arm)   Pulse 74   Temp 98.5 F (36.9 C) (Oral)   Resp 16   Ht 6\' 3"  (1.905 m)   Wt 83.9 kg   SpO2 95%   BMI 23.12 kg/m   Physical Exam Vitals and nursing note reviewed.  Constitutional:      Appearance: He is well-developed.  HENT:     Head: Normocephalic.     Nose: Nose normal.  Eyes:     General: No  scleral icterus.    Conjunctiva/sclera: Conjunctivae normal.  Neck:     Thyroid: No thyromegaly.  Cardiovascular:     Rate and Rhythm: Normal rate and regular rhythm.     Heart sounds: No murmur heard.   No friction rub. No gallop.  Pulmonary:     Breath sounds: No stridor. No wheezing or rales.  Chest:     Chest wall: No tenderness.  Abdominal:     General: There is no distension.     Tenderness: There is no abdominal tenderness. There is no rebound.  Musculoskeletal:        General: Normal range of motion.     Cervical back: Neck supple.  Lymphadenopathy:     Cervical: No cervical adenopathy.  Skin:    Findings: No erythema or rash.  Neurological:     Mental Status: He is oriented to person, place, and time.     Motor: No abnormal muscle tone.     Coordination: Coordination normal.  Psychiatric:     Comments: Patient is suicidal    ED Results / Procedures / Treatments   Labs (all labs ordered are listed, but only abnormal results are displayed) Labs Reviewed  COMPREHENSIVE METABOLIC PANEL - Abnormal; Notable for the following components:      Result Value   Glucose, Bld 106 (*)    All other components within normal limits  SALICYLATE LEVEL - Abnormal; Notable for the following components:   Salicylate Lvl <7.0 (*)    All other  components within normal limits  ACETAMINOPHEN LEVEL - Abnormal; Notable for the following components:   Acetaminophen (Tylenol), Serum <10 (*)    All other components within normal limits  RESP PANEL BY RT-PCR (FLU A&B, COVID) ARPGX2  ETHANOL  CBC  RAPID URINE DRUG SCREEN, HOSP PERFORMED    EKG None  Radiology No results found.  Procedures Procedures   Medications Ordered in ED Medications - No data to display  ED Course  I have reviewed the triage vital signs and the nursing notes.  Pertinent labs & imaging results that were available during my care of the patient were reviewed by me and considered in my medical decision making (see chart for details). Patient is medically cleared and suicidal behavior health will see the patient   MDM Rules/Calculators/A&P                           Patient is medically cleared and suicidal Final Clinical Impression(s) / ED Diagnoses Final diagnoses:  None    Rx / DC Orders ED Discharge Orders     None        Bethann Berkshire, MD 04/02/21 2016

## 2021-04-03 ENCOUNTER — Inpatient Hospital Stay (HOSPITAL_COMMUNITY)
Admission: AD | Admit: 2021-04-03 | Discharge: 2021-04-08 | DRG: 885 | Disposition: A | Discharge status: Home / Self Care | Payer: Federal, State, Local not specified - Other | Source: Intra-hospital | Attending: Emergency Medicine | Admitting: Emergency Medicine

## 2021-04-03 ENCOUNTER — Ambulatory Visit (HOSPITAL_COMMUNITY)
Admission: EM | Admit: 2021-04-03 | Discharge: 2021-04-03 | Disposition: A | Discharge status: Other Acute Inpatient Hospital | Payer: No Payment, Other | Attending: Urology | Admitting: Urology

## 2021-04-03 ENCOUNTER — Encounter (HOSPITAL_COMMUNITY): Payer: Self-pay | Admitting: Emergency Medicine

## 2021-04-03 ENCOUNTER — Other Ambulatory Visit: Payer: Self-pay

## 2021-04-03 DIAGNOSIS — Z9151 Personal history of suicidal behavior: Secondary | ICD-10-CM | POA: Insufficient documentation

## 2021-04-03 DIAGNOSIS — Z79899 Other long term (current) drug therapy: Secondary | ICD-10-CM

## 2021-04-03 DIAGNOSIS — F332 Major depressive disorder, recurrent severe without psychotic features: Secondary | ICD-10-CM | POA: Insufficient documentation

## 2021-04-03 DIAGNOSIS — R45851 Suicidal ideations: Secondary | ICD-10-CM | POA: Diagnosis present

## 2021-04-03 DIAGNOSIS — Z79890 Hormone replacement therapy (postmenopausal): Secondary | ICD-10-CM | POA: Diagnosis not present

## 2021-04-03 DIAGNOSIS — G4700 Insomnia, unspecified: Secondary | ICD-10-CM | POA: Diagnosis present

## 2021-04-03 DIAGNOSIS — Z20822 Contact with and (suspected) exposure to covid-19: Secondary | ICD-10-CM | POA: Diagnosis present

## 2021-04-03 DIAGNOSIS — E039 Hypothyroidism, unspecified: Secondary | ICD-10-CM | POA: Diagnosis present

## 2021-04-03 DIAGNOSIS — K5900 Constipation, unspecified: Secondary | ICD-10-CM | POA: Diagnosis present

## 2021-04-03 DIAGNOSIS — F17210 Nicotine dependence, cigarettes, uncomplicated: Secondary | ICD-10-CM | POA: Diagnosis present

## 2021-04-03 DIAGNOSIS — F411 Generalized anxiety disorder: Secondary | ICD-10-CM | POA: Diagnosis present

## 2021-04-03 LAB — LIPID PANEL
Cholesterol: 151 mg/dL (ref 0–200)
HDL: 37 mg/dL — ABNORMAL LOW (ref >40)
LDL Cholesterol: 59 mg/dL (ref 0–99)
Total CHOL/HDL Ratio: 4.1 RATIO
Triglycerides: 273 mg/dL — ABNORMAL HIGH (ref <150)
VLDL: 55 mg/dL — ABNORMAL HIGH (ref 0–40)

## 2021-04-03 LAB — CARBAMAZEPINE LEVEL, TOTAL: Carbamazepine Lvl: <2 ug/mL — ABNORMAL LOW (ref 4.0–12.0)

## 2021-04-03 LAB — RESP PANEL BY RT-PCR (FLU A&B, COVID) ARPGX2
Influenza A by PCR: NEGATIVE
Influenza B by PCR: NEGATIVE
SARS Coronavirus 2 by RT PCR: NEGATIVE

## 2021-04-03 LAB — TSH: TSH: 4.449 u[IU]/mL (ref 0.350–4.500)

## 2021-04-03 LAB — T4, FREE: Free T4: 0.97 ng/dL (ref 0.61–1.12)

## 2021-04-03 MED ORDER — LEVOTHYROXINE SODIUM 25 MCG PO TABS
25.0000 ug | ORAL_TABLET | Freq: Every day | ORAL | Status: DC
  Administered 2021-04-03: 25 ug via ORAL
  Filled 2021-04-03: qty 1

## 2021-04-03 MED ORDER — HYDROXYZINE HCL 25 MG PO TABS: 25.0000 mg | ORAL_TABLET | Freq: Three times a day (TID) | ORAL | Status: DC | PRN

## 2021-04-03 MED ORDER — ALUM & MAG HYDROXIDE-SIMETH 200-200-20 MG/5ML PO SUSP: 30.0000 mL | ORAL | Status: DC | PRN

## 2021-04-03 MED ORDER — NICOTINE 21 MG/24HR TD PT24
21.0000 mg | MEDICATED_PATCH | Freq: Every day | TRANSDERMAL | Status: DC
  Administered 2021-04-04 – 2021-04-08 (×5): 21 mg via TRANSDERMAL
  Filled 2021-04-03 (×9): qty 1

## 2021-04-03 MED ORDER — LEVOTHYROXINE SODIUM 25 MCG PO TABS
25.0000 ug | ORAL_TABLET | Freq: Every day | ORAL | Status: DC
  Administered 2021-04-04 – 2021-04-08 (×5): 25 ug via ORAL
  Filled 2021-04-03 (×3): qty 1
  Filled 2021-04-03: qty 7
  Filled 2021-04-03 (×4): qty 1

## 2021-04-03 MED ORDER — ARIPIPRAZOLE 5 MG PO TABS
5.0000 mg | ORAL_TABLET | Freq: Every day | ORAL | Status: DC
  Administered 2021-04-03: 5 mg via ORAL
  Filled 2021-04-03: qty 1

## 2021-04-03 MED ORDER — ONDANSETRON 4 MG PO TBDP: 4.0000 mg | ORAL_TABLET | Freq: Four times a day (QID) | ORAL | Status: AC | PRN

## 2021-04-03 MED ORDER — ADULT MULTIVITAMIN W/MINERALS CH
1.0000 | ORAL_TABLET | Freq: Every day | ORAL | Status: DC
  Administered 2021-04-03 – 2021-04-08 (×6): 1 via ORAL
  Filled 2021-04-03: qty 1
  Filled 2021-04-03: qty 7
  Filled 2021-04-03 (×8): qty 1

## 2021-04-03 MED ORDER — TRAZODONE HCL 50 MG PO TABS: 50.0000 mg | ORAL_TABLET | Freq: Every evening | ORAL | Status: DC | PRN

## 2021-04-03 MED ORDER — THIAMINE HCL 100 MG PO TABS
100.0000 mg | ORAL_TABLET | Freq: Every day | ORAL | Status: DC
  Administered 2021-04-04 – 2021-04-08 (×5): 100 mg via ORAL
  Filled 2021-04-03 (×8): qty 1

## 2021-04-03 MED ORDER — TRAZODONE HCL 50 MG PO TABS
50.0000 mg | ORAL_TABLET | Freq: Every evening | ORAL | Status: DC | PRN
  Administered 2021-04-03: 50 mg via ORAL
  Filled 2021-04-03: qty 1

## 2021-04-03 MED ORDER — SERTRALINE HCL 50 MG PO TABS
150.0000 mg | ORAL_TABLET | Freq: Every day | ORAL | Status: DC
  Administered 2021-04-04: 150 mg via ORAL
  Filled 2021-04-03 (×3): qty 3

## 2021-04-03 MED ORDER — SERTRALINE HCL 50 MG PO TABS
150.0000 mg | ORAL_TABLET | Freq: Every day | ORAL | Status: DC
  Administered 2021-04-03: 150 mg via ORAL
  Filled 2021-04-03: qty 3

## 2021-04-03 MED ORDER — TAMSULOSIN HCL 0.4 MG PO CAPS
0.4000 mg | ORAL_CAPSULE | Freq: Every day | ORAL | Status: DC
  Administered 2021-04-03: 0.4 mg via ORAL
  Filled 2021-04-03: qty 1

## 2021-04-03 MED ORDER — FAMOTIDINE 20 MG PO TABS: 20.0000 mg | ORAL_TABLET | Freq: Every day | ORAL | Status: DC

## 2021-04-03 MED ORDER — ACETAMINOPHEN 325 MG PO TABS: 650.0000 mg | ORAL_TABLET | Freq: Four times a day (QID) | ORAL | Status: DC | PRN

## 2021-04-03 MED ORDER — LORAZEPAM 1 MG PO TABS: 1.0000 mg | ORAL_TABLET | Freq: Four times a day (QID) | ORAL | Status: AC | PRN

## 2021-04-03 MED ORDER — NICOTINE 21 MG/24HR TD PT24
21.0000 mg | MEDICATED_PATCH | Freq: Every day | TRANSDERMAL | Status: DC
Start: 2021-04-03 — End: 2021-04-03
  Administered 2021-04-03: 21 mg via TRANSDERMAL

## 2021-04-03 MED ORDER — MAGNESIUM HYDROXIDE 400 MG/5ML PO SUSP: 30.0000 mL | Freq: Every day | ORAL | Status: DC | PRN

## 2021-04-03 MED ORDER — HYDROXYZINE HCL 25 MG PO TABS
25.0000 mg | ORAL_TABLET | Freq: Three times a day (TID) | ORAL | Status: DC | PRN
  Administered 2021-04-04 – 2021-04-08 (×9): 25 mg via ORAL
  Filled 2021-04-03 (×9): qty 1

## 2021-04-03 MED ORDER — OXCARBAZEPINE 150 MG PO TABS
150.0000 mg | ORAL_TABLET | Freq: Two times a day (BID) | ORAL | Status: DC
  Administered 2021-04-03: 150 mg via ORAL
  Filled 2021-04-03: qty 1

## 2021-04-03 MED ORDER — ARIPIPRAZOLE 5 MG PO TABS
5.0000 mg | ORAL_TABLET | Freq: Every day | ORAL | Status: DC
  Administered 2021-04-04: 5 mg via ORAL
  Filled 2021-04-03 (×4): qty 1

## 2021-04-03 MED ORDER — FAMOTIDINE 20 MG PO TABS
20.0000 mg | ORAL_TABLET | Freq: Every day | ORAL | Status: DC
  Administered 2021-04-03 – 2021-04-07 (×5): 20 mg via ORAL
  Filled 2021-04-03 (×3): qty 1
  Filled 2021-04-03: qty 7
  Filled 2021-04-03 (×4): qty 1

## 2021-04-03 MED ORDER — LOPERAMIDE HCL 2 MG PO CAPS: 2.0000 mg | ORAL_CAPSULE | ORAL | Status: AC | PRN

## 2021-04-03 MED ORDER — HYDROXYZINE HCL 25 MG PO TABS: 25.0000 mg | ORAL_TABLET | Freq: Four times a day (QID) | ORAL | Status: DC | PRN

## 2021-04-03 MED ORDER — OXCARBAZEPINE 150 MG PO TABS
150.0000 mg | ORAL_TABLET | Freq: Two times a day (BID) | ORAL | Status: DC
  Administered 2021-04-03 – 2021-04-04 (×2): 150 mg via ORAL
  Filled 2021-04-03 (×7): qty 1

## 2021-04-03 NOTE — Progress Notes (Signed)
     04/03/21 2015  Psych Admission Type (Psych Patients Only)  Admission Status Voluntary  Psychosocial Assessment  Patient Complaints Anxiety;Depression;Worrying  Eye Contact Brief  Facial Expression Flat  Affect Anxious;Fearful  Speech Logical/coherent  Interaction Assertive  Motor Activity Slow  Appearance/Hygiene Unremarkable  Behavior Characteristics Cooperative  Mood Pleasant;Anxious  Thought Process  Coherency WDL  Content WDL  Delusions None reported or observed  Perception WDL  Hallucination None reported or observed  Judgment Poor  Confusion None  Danger to Self  Current suicidal ideation? Passive  Self-Injurious Behavior No self-injurious ideation or behavior indicators observed or expressed   Agreement Not to Harm Self Yes  Description of Agreement verbally contracted for safety  Danger to Others  Danger to Others None reported or observed

## 2021-04-03 NOTE — Progress Notes (Signed)
  ADMISSION DAR NOTE:   Pt  transferred from [facility] under voluntary status. Alert and oriented by 3. Pt is disheveled with depressed mood, logical speech and fair eye contact.  Reports worsening of Suicidal thought with plan to cut his wrist, and lack of energy and hopelessness, verbally contracted for safety. Patient stated his medications are not working anymore.    Denies A/VH/HI. Patient states he seeks care with Dr Evelene Croon. Patient stays alone and he recently stopped working. He reports poor appetite. He denies using drugs or alcohol. Smoke a pack of cigarettes a day.  Emotional support and availability offered to Patient as needed. Skin assessment done and belongings searched per protocol. Items deemed contraband secured in locker. Unit orientation and routine discussed, Care Plan reviewed as well and Patient verbalized understanding. Fluids and Food offered, tolerated well. Q15 minutes safety checks initiated without self harm gestures.

## 2021-04-03 NOTE — Progress Notes (Signed)
Patient information has been sent to [facility] via secure chat to review for potential admission. Patient meets inpatient criteria per Dr.Laubach.   Situation ongoing, CSW will continue to monitor progress.    Signed:  Damita Dunnings, MSW, LCSW-A  04/03/2021 9:37 AM

## 2021-04-03 NOTE — ED Notes (Signed)
[facility] on the way

## 2021-04-03 NOTE — Discharge Instructions (Addendum)
Pt accepted to [facility]. Attending Dr Singleton.  

## 2021-04-03 NOTE — Progress Notes (Signed)
Pt accepted to [facility] 307-1   Patient meets inpatient criteria per Dr. Faylene Kurtz is the attending provider.    Call report to [phone]    Milas Hock, RN @ [facility] notified.     Pt scheduled  to arrive at [facility] today at 1400.   Damita Dunnings, MSW, LCSW-A  1:32 PM 04/03/2021

## 2021-04-03 NOTE — ED Notes (Signed)
PATIENT TRANSFERRED TO [facility]

## 2021-04-03 NOTE — ED Notes (Signed)
Pt transported to [facility] with [facility]. Belongings and paperwork given to [employer] Acup[employer]ist). Pt ambulatory to car.

## 2021-04-03 NOTE — ED Notes (Signed)
Locker 29 

## 2021-04-03 NOTE — Plan of Care (Signed)
  Problem: Education: Goal: Emotional status will improve Outcome: Not Progressing Goal: Mental status will improve Outcome: Not Progressing   Problem: Health Behavior/Discharge Planning: Goal: Identification of resources available to assist in meeting health care needs will improve Outcome: Not Progressing   

## 2021-04-03 NOTE — Tx Team (Signed)
Initial Treatment Plan 04/03/2021 5:05 PM [name] PHX:[id number]    PATIENT STRESSORS: Financial difficulties Medication change or noncompliance   PATIENT STRENGTHS: Ability for insight Capable of independent living Communication skills Motivation for treatment/growth Supportive family/friends   PATIENT IDENTIFIED PROBLEMS: Suicide ideation  "My medication is not working anymore"                   DISCHARGE CRITERIA:  Ability to meet basic life and health needs Improved stabilization in mood, thinking, and/or behavior Motivation to continue treatment in a less acute level of care Need for constant or close observation no longer present Reduction of life-threatening or endangering symptoms to within safe limits Verbal commitment to aftercare and medication compliance  PRELIMINARY DISCHARGE PLAN: Outpatient therapy Return to previous living arrangement Return to previous work or school arrangements  PATIENT/FAMILY INVOLVEMENT: This treatment plan has been presented to and reviewed with the patient, Christopher Giles, and/or family member.  The patient and family have been given the opportunity to ask questions and make suggestions.  Margarita Rana, RN 04/03/2021, 5:05 PM

## 2021-04-03 NOTE — [facility] Suicide Risk Assessment (Signed)
[facility] Admission Suicide Risk Assessment   Nursing information obtained from:  Patient Demographic factors:  Male, caucasian Current Mental Status:  Suicidal ideation indicated by patient Loss Factors:  Financial problems / change in socioeconomic status Historical Factors:  Impulsivity, Prior suicide attempts Risk Reduction Factors:  Positive social support  Total Time Spent in Direct Patient Care:  I personally spent 40 minutes on the unit in direct patient care. The direct patient care time included face-to-face time with the patient, reviewing the patient's chart, communicating with other professionals, and coordinating care. Greater than 50% of this time was spent in counseling or coordinating care with the patient regarding goals of hospitalization, psycho-education, and discharge planning needs.  Principal Problem: Severe episode of recurrent major depressive disorder, without psychotic features (HCC) Diagnosis:  Principal Problem:   Severe episode of recurrent major depressive disorder, without psychotic features (HCC)  Subjective Data: The patient is a 54y/o male with past psychiatric history significant for MDD, anxiety, and previous cocaine abuse, who presented voluntarily to [facility] and was then transferred to [facility] for management of worsening depression and SI with thoughts of cutting his wrist. He reportedly had not been taking his psychotropic medications for at least a week prior to admission and had been "rationing" his medications prior to that time. He was transferred from [facility] voluntarily for management of ongoing depression and SI.  According to his records, he was last admitted at [facility] from 02/08/21 until 02/15/21 for depression with SI and was discharged home on Abilify 5mg  daily for mood control, Trileptal 150mg  bid for mood stabilization, Zoloft 50mg  for depression, Vistaril 25mg  bid PRN for anxiety, and Xanax 0.5mg  bid for anxiety. According to his records, he reportedly has had  6-8 previous psychiatric admissions mostly for depression and SI and previously reported 6 previous suicide attempts mostly by cutting or overdose. On assessment the patient states he has been more depressed in recent weeks and believes his medications were not working. He has had low energy, weight loss with low appetite, poor focus, and sleep issues. He denies h/o mania/hypomania and denies AVH or delusions. He states he has not used any recent alcohol or illicit substances and his UDS was negative on admission. See H&P for additional details.  Continued Clinical Symptoms:  Alcohol Use Disorder Identification Test Final Score (AUDIT): 0 The "Alcohol Use Disorders Identification Test", Guidelines for Use in Primary Care, Second Edition.  [facility]). Score between 0-7:  no or low risk or alcohol related problems. Score between 8-15:  moderate risk of alcohol related problems. Score between 16-19:  high risk of alcohol related problems. Score 20 or above:  warrants further diagnostic evaluation for alcohol dependence and treatment.  CLINICAL FACTORS:   Depression:   Anhedonia Hopelessness Impulsivity Severe Previous Psychiatric Diagnoses and Treatments  Musculoskeletal: Strength & Muscle Tone: within normal limits Gait & Station: normal, steady Patient leans: N/A Psychiatric Specialty Exam: Physical Exam Vitals reviewed.  HENT:     Head: Normocephalic.  Pulmonary:     Effort: Pulmonary effort is normal.  Neurological:     General: No focal deficit present.     Mental Status: He is alert.    Review of Systems- see H&P  Blood pressure 98/66, pulse (!) 58, temperature 98.7 F (37.1 C), resp. rate 17, height 6\' 3"  (1.905 m), weight 78.9 kg, SpO2 97 %.Body mass index is 21.75 kg/m.  General Appearance: Disheveled  Eye Contact:  Fair  Speech:  Clear and Coherent and Normal  Rate  Volume:  Normal  Mood:  Depressed  Affect:  Constricted  Thought Process:  Goal  Directed and Linear  Orientation:  Full (Time, Place, and Person)  Thought Content:  Logical and denies AVH, paranoia or delusions  Suicidal Thoughts:  Yes.  with intent/plan  Homicidal Thoughts:  No  Memory:  Recent;   Fair  Judgement:  Fair  Insight:  Fair  Psychomotor Activity:  Normal  Concentration:  Concentration: Fair and Attention Span: Fair  Recall:  [employer] of Knowledge:  Fair  Language:  Good  Akathisia:  Negative  Assets:  Communication Skills Desire for Improvement Resilience  ADL's:  independent  Cognition:  WNL  Sleep:  Number of Hours: 4.75   COGNITIVE FEATURES THAT CONTRIBUTE TO RISK:  None    SUICIDE RISK:   Moderate:  Frequent suicidal ideation with limited intensity, and duration, some specificity in terms of plans, no associated intent, good self-control, limited dysphoria/symptomatology, some risk factors present, and identifiable protective factors, including available and accessible social support.  PLAN OF CARE: Patient admitted to [facility] voluntarily. Admission labs reviewed: CMP WNL except for glucose 106, ETOH <10, Salicylate <7, Tylenol <10, CBC WNL, UDS negative, respiratory panel negative, Tegretol level <2; TSH 4.449; FT4 0.97; A1c 5.4; and lipid panel WNL except for triglycerides 273, HDL 37 and VLDL 55. Various psychotropic medications were discussed with the patient and he requests to stop Zoloft, Abilify, and Trileptal stating they are not helping and citing recent medication noncompliance. After discussion of options, he agrees to a trial of Prozac 20mg  (r/b/se/a to med reviewed) and Neurontin 100mg  tid to help with residual anxiety. We will verify and restart his home Synthroid dose.   I certify that inpatient services furnished can reasonably be expected to improve the patient's condition.   , MD, FAPA 04/04/2021, 2:05 PM

## 2021-04-03 NOTE — ED Notes (Signed)
Snack given @11:30 

## 2021-04-03 NOTE — ED Provider Notes (Signed)
FBC/OBS ASAP Discharge Summary  Date and Time: 04/03/2021 8:46 AM  Name: Christopher Giles  MRN:  [id number]   Discharge Diagnoses:  Final diagnoses:  Severe episode of recurrent major depressive disorder, without psychotic features (HCC)    Subjective: Pt is seen and examined today. Pt states his mood is "not good" . Pt rates his mood at 1/10 (10 is the best mood). Pt didn't sleep well last night. Pt states his appetite is good. Pt feels anxious. Currently, Pt endorses active suicidal ideation with intention and plan to cut his wrist. Denies homicidal ideation and, visual and auditory hallucination. Pt endorses racing thoughts.  Pt states his medications have not been working and he needs different medications. He states he was working at [employer] but he stopped working there about 3 months ago. He denies any using any drugs currently but previously used cocaine.   Stay Summary:  Christopher Giles is a 54y/o male with psychiatric history of patient presented voluntarily to [facility] for evaluation of worsening depression and suicidal ideation. Patient was transferred [facility] to [facility] for continuous assessment. Pt was admitted to the observation unit at [facility]. Pt was restarted on  his home meds. Pt was reevaluated this morning. Today, he reported depressed mood and active suicidal thoughts of hurting by cutting his wrist.  Patient denies HI and AVH.  His home medication restarted.  Patient meets criteria for inpatient admission. Pt is accepted to [facility]. Attending Dr Mason Jim will  be transferred via safe transport.  Total Time spent with patient: 20 minutes  Past Psychiatric History: MDD , Past SA's, Cocaine use disorder (past), Anxiety Past Medical History:  Past Medical History:  Diagnosis Date   Anxiety    Depression    Moderate cocaine use disorder (HCC) 02/09/2021   Substance abuse (HCC)    Suicide attempt (HCC)    x 6 per pt   Thyroid disease    hypo    Past Surgical History:  Procedure  Laterality Date   punctured lung     Family History:  Family History  Problem Relation Age of Onset   Hyperlipidemia Mother    Cancer Mother    Cancer Father    Diabetes Maternal Grandmother    Family Psychiatric History: None per chart Social History:  Social History   Substance and Sexual Activity  Alcohol Use No     Social History   Substance and Sexual Activity  Drug Use No    Social History   Socioeconomic History   Marital status: Single    Spouse name: Not on file   Number of children: Not on file   Years of education: 12   Highest education level: High school graduate  Occupational History   Not on file  Tobacco Use   Smoking status: Every Day    Packs/day: 1.00    Types: Cigarettes   Smokeless tobacco: Never  Substance and Sexual Activity   Alcohol use: No   Drug use: No   Sexual activity: Never    Birth control/protection: Abstinence  Other Topics Concern   Not on file  Social History Narrative   Not on file   Social Determinants of Health   Financial Resource Strain: Not on file  Food Insecurity: Not on file  Transportation Needs: Not on file  Physical Activity: Not on file  Stress: Not on file  Social Connections: Not on file   SDOH:  SDOH Screenings   Alcohol Screen: Low Risk  Last Alcohol Screening Score (AUDIT): 0  Depression (PHQ2-9): Medium Risk   PHQ-2 Score: 18  Financial Resource Strain: Not on file  Food Insecurity: Not on file  Housing: Not on file  Physical Activity: Not on file  Social Connections: Not on file  Stress: Not on file  Tobacco Use: High Risk   Smoking Tobacco Use: Every Day   Smokeless Tobacco Use: Never  Transportation Needs: Not on file    Tobacco Cessation:  A prescription for an FDA-approved tobacco cessation medication provided at discharge  Current Medications:  No current facility-administered medications for this encounter.   Current Outpatient Medications  Medication Sig Dispense Refill    ALPRAZolam (XANAX) 0.5 MG tablet Take 1 tablet (0.5 mg total) by mouth 2 (two) times daily. For anxiety (Patient not taking: Reported on 04/02/2021) 6 tablet 0   ARIPiprazole (ABILIFY) 5 MG tablet Take 1 tablet (5 mg total) by mouth daily. For mood control (Patient not taking: Reported on 04/02/2021) 30 tablet 0   docusate sodium (COLACE) 100 MG capsule Take 1 capsule (100 mg total) by mouth daily. (May buy from over the counter): For constipation (Patient not taking: No sig reported) 1 capsule 0   famotidine (PEPCID) 20 MG tablet Take 1 tablet (20 mg total) by mouth at bedtime. For acid reflux (Patient not taking: No sig reported) 60 tablet 0   hydrOXYzine (ATARAX/VISTARIL) 25 MG tablet Take 1 tablet (25 mg total) by mouth 2 (two) times daily as needed for anxiety. (Patient not taking: Reported on 04/02/2021) 75 tablet 0   levothyroxine (SYNTHROID) 25 MCG tablet Take 1 tablet (25 mcg total) by mouth daily at 6 (six) AM. For low functioning thyroid gland (Patient not taking: Reported on 04/02/2021) 15 tablet 0   Multiple Vitamin (MULTIVITAMIN WITH MINERALS) TABS tablet Take 1 tablet by mouth daily. (May buy from over the counter): Nutritional supplement (Patient not taking: No sig reported) 1 tablet 0   naproxen sodium (ALEVE) 220 MG tablet Take 220-440 mg by mouth 2 (two) times daily as needed (for pain or headaches).     nicotine (NICODERM CQ - DOSED IN MG/24 HOURS) 21 mg/24hr patch Place 1 patch (21 mg total) onto the skin daily. (May buy from over the counter): For smoking cessation (Patient not taking: Reported on 04/02/2021) 1 patch 0   OXcarbazepine (TRILEPTAL) 150 MG tablet Take 1 tablet (150 mg total) by mouth 2 (two) times daily. For mood stabilization (Patient not taking: Reported on 04/02/2021) 60 tablet 0   polyethylene glycol (MIRALAX / GLYCOLAX) 17 g packet Take 17 g by mouth daily. (May buy from over the counter): For constipation (Patient taking differently: Take 17 g by mouth daily as needed  for moderate constipation.) 1 each 0   sertraline (ZOLOFT) 50 MG tablet Take 3 tablets (150 mg total) by mouth daily. For depression (Patient not taking: Reported on 04/02/2021) 150 tablet 0   silodosin (RAPAFLO) 8 MG CAPS capsule Take 8 mg by mouth daily with breakfast. (Patient not taking: Reported on 04/02/2021)     tamsulosin (FLOMAX) 0.4 MG CAPS capsule Take 1 capsule (0.4 mg total) by mouth daily. For Prostate health (Patient not taking: No sig reported) 15 capsule 0   traZODone (DESYREL) 50 MG tablet Take 1 tablet (50 mg total) by mouth at bedtime as needed for sleep. (Patient not taking: No sig reported) 30 tablet 0    PTA Medications: (Not in a hospital admission)   Musculoskeletal  Strength & Muscle Tone:  within normal limits Gait & Station: normal Patient leans: N/A  Psychiatric Specialty Exam  Presentation  General Appearance: Disheveled  Eye Contact:Good  Speech:Clear and Coherent  Speech Volume:Normal  Handedness:Right   Mood and Affect  Mood:Depressed; Anxious  Affect:Congruent   Thought Process  Thought Processes:Goal Directed; Coherent  Descriptions of Associations:Intact  Orientation:Full (Time, Place and Person)  Thought Content:Logical  Diagnosis of Schizophrenia or Schizoaffective disorder in past: No    Hallucinations:Hallucinations: None  Ideas of Reference:None  Suicidal Thoughts:Suicidal Thoughts: Yes, Active SI Active Intent and/or Plan: With Intent; With Plan  Homicidal Thoughts:Homicidal Thoughts: No   Sensorium  Memory:Immediate Good; Remote Good; Recent Good  Judgment:Fair  Insight:Good   Executive Functions  Concentration:Good  Attention Span:Good  Recall:Good  Fund of Knowledge:Good  Language:Good   Psychomotor Activity  Psychomotor Activity:Psychomotor Activity: Normal   Assets  Assets:Communication Skills; Housing; Health and safety inspector; Physical Health; Desire for Improvement   Sleep   Sleep:Sleep: Poor Number of Hours of Sleep: 5   Nutritional Assessment (For OBS and FBC admissions only) Has the patient had a weight loss or gain of 10 pounds or more in the last 3 months?: Yes Has the patient had a decrease in food intake/or appetite?: Yes Does the patient have dental problems?: No Does the patient have eating habits or behaviors that may be indicators of an eating disorder including binging or inducing vomiting?: No Has the patient recently lost weight without trying?: Yes, 2-13 lbs. Has the patient been eating poorly because of a decreased appetite?: No Malnutrition Screening Tool Score: 1   Physical Exam  Physical Exam Vitals and nursing note reviewed.  Constitutional:      General: He is not in acute distress.    Appearance: Normal appearance. He is not ill-appearing, toxic-appearing or diaphoretic.  HENT:     Head: Normocephalic and atraumatic.  Pulmonary:     Effort: Pulmonary effort is normal.  Neurological:     General: No focal deficit present.     Mental Status: He is alert and oriented to person, place, and time.   Review of Systems  Constitutional:  Negative for chills and fever.  HENT:  Negative for hearing loss.   Eyes:  Negative for blurred vision.  Respiratory:  Negative for cough.   Cardiovascular:  Negative for chest pain.  Musculoskeletal:  Negative for myalgias.  Skin:  Negative for rash.  Neurological:  Negative for dizziness and headaches.  Psychiatric/Behavioral:  Positive for depression and suicidal ideas. Negative for hallucinations and substance abuse. The patient is nervous/anxious and has insomnia.   Blood pressure 98/72, pulse 72, temperature 98.1 F (36.7 C), resp. rate 18, SpO2 99 %. There is no height or weight on file to calculate BMI.  Demographic Factors:  Male, Living alone, and Unemployed  Loss Factors: Decrease in vocational status  Historical Factors: Prior suicide attempts and Impulsivity  Risk Reduction  Factors:   Positive social support and Positive therapeutic relationship  Continued Clinical Symptoms:  Depression:   Anhedonia Hopelessness Impulsivity Insomnia Severe  Cognitive Features That Contribute To Risk:  None    Suicide Risk:  Severe:  Frequent, intense, and enduring suicidal ideation, specific plan, no subjective intent, but some objective markers of intent (i.e., choice of lethal method), the method is accessible, some limited preparatory behavior, evidence of impaired self-control, severe dysphoria/symptomatology, multiple risk factors present, and few if any protective factors, particularly a lack of social support.  Plan Of Care/Follow-up recommendations:  Activity:  As tolerated -Labs reviewed-U  tox negative, CBC CMP within normal limit, alcohol level less than 10, salicylate level less than 7, glucose- 106, respiratory panel negative.  -Patient meets criteria for inpatient admission.  Patient is actively suicidal. -Continue Synthroid, Pepcid, Flomax. -Continue hydroxyzine for anxiety and trazodone for sleep. -Continue home medications-Abilify 5 mg, Trileptal 150 mg twice daily and Zoloft 150 mg daily. -Continue to monitor for SI/HI/AVH -Continue monitor for side effects. Pt is accepted to [facility]. Attending Dr Mason Jim will  be transferred via safe transport.   Disposition: Patient meets criteria for inpatient admission.  Patient is actively suicidal. Pt is accepted to [facility]. Attending Dr Mason Jim will  be transferred via safe transport.   Karsten Ro, MD 04/03/2021, 8:46 AM

## 2021-04-03 NOTE — ED Notes (Signed)
Contacted [facility] to transfer pt, no answer - left message. Confirmed with [facility] that patient has a bed. Pt requires transport in order to go.

## 2021-04-03 NOTE — ED Provider Notes (Signed)
Behavioral Health Admission H&P [facility](FBC & OBS)  Date: 04/03/21 Patient Name: Christopher Giles MRN: [id number] Chief Complaint:  Chief Complaint  Patient presents with   Suicidal       Diagnoses:  Final diagnoses:  Severe episode of recurrent major depressive disorder, without psychotic features (HCC)    HPI: Christopher BanningJames C Lakatos is a 54y/o male with psychiatric history of patient presented voluntarily to [facility] for evaluation of worsening depression and suicidal ideation. [facility].   Patient seen face to face and chart reviewed by this NP. Patient is alert and orient X4, he is calm and cooperative. Patient's speech is clear and coherent, his thought process is coherent. Patient's mood is depressed and his affect is congruent with mood. Patient does not apear to be experiencing any paranoia symptoms or delusional thought content. He also doesn't appear to be responding to any internal or external stimuli.   Patient continues to endorse suicidal ideation with plan to cut his wrist. Patient admits to prior multiple suicidal attempts via cutting of his wrist and overdosing. He denies HI, AVH, and substance use. He reports that his depression has worsened over that past 2 months due to his medication not working properly. He report compliance with his medication until about 1 week ago when he ran out of psychotropic medications.   Patient endorses depressive symptoms of isolation, poor sleep, irritability, hopelessness, worthlessness, poor appetite and poor concentration.   PHQ 2-9:  Flowsheet Row [facility]  Thoughts that you would be better off dead, or of hurting yourself in some way More than half the days Nearly every day  PHQ-9 Total Score 18 27       Flowsheet Row [facility]) [facility] Most recent reading at 04/03/2021  4:59 PM [facility] Most recent reading at 04/03/2021  2:46 AM [facility] Most recent reading at 04/02/2021 10:09 PM  C-SSRS RISK CATEGORY High Risk High Risk High Risk        Total Time spent with patient: 20 minutes  Musculoskeletal  Strength & Muscle Tone: within normal limits Gait & Station: normal Patient leans: Right  Psychiatric Specialty Exam  Presentation General Appearance: Disheveled  Eye Contact:Good  Speech:Clear and Coherent  Speech Volume:Normal  Handedness:Right   Mood and Affect  Mood:Depressed; Anxious  Affect:Congruent   Thought Process  Thought Processes:Goal Directed  Descriptions of Associations:Intact  Orientation:Full (Time, Place and Person)  Thought Content:Logical  Diagnosis of Schizophrenia or Schizoaffective disorder in past: No   Hallucinations:Hallucinations: None  Ideas of Reference:None  Suicidal Thoughts:Suicidal Thoughts: Yes, Active SI Active Intent and/or Plan: With Intent; With Plan  Homicidal Thoughts:Homicidal Thoughts: No   Sensorium  Memory:Immediate Good; Remote Good; Recent Good  Judgment:Fair  Insight:Good   Executive Functions  Concentration:Good  Attention Span:Good  Recall:Good  Fund of Knowledge:Good  Language:Good   Psychomotor Activity  Psychomotor Activity:Psychomotor Activity: Normal   Assets  Assets:Communication Skills; Housing; Health and safety inspectorinancial Resources/Insurance; Physical Health; Desire for Improvement   Sleep  Sleep:Sleep: Poor Number of Hours of Sleep: 5   Nutritional Assessment (For OBS and FBC admissions only) Has the patient had a weight loss or gain of 10 pounds or more in the last 3 months?: Yes Has the patient  had a decrease in food intake/or appetite?: Yes Does the patient have  dental problems?: No Does the patient have eating habits or behaviors that may be indicators of an eating disorder including binging or inducing vomiting?: No Has the patient recently lost weight without trying?: Yes, 2-13 lbs. Has the patient been eating poorly because of a decreased appetite?: No Malnutrition Screening Tool Score: 1   Physical Exam Vitals and nursing note reviewed.  Constitutional:      Appearance: He is well-developed.  HENT:     Head: Normocephalic and atraumatic.  Eyes:     Conjunctiva/sclera: Conjunctivae normal.  Cardiovascular:     Rate and Rhythm: Normal rate.  Pulmonary:     Effort: Pulmonary effort is normal. No respiratory distress.     Breath sounds: Normal breath sounds.  Abdominal:     Palpations: Abdomen is soft.     Tenderness: There is no abdominal tenderness.  Musculoskeletal:     Cervical back: Neck supple.  Skin:    General: Skin is warm.  Neurological:     Mental Status: He is alert and oriented to person, place, and time.  Psychiatric:        Attention and Perception: Attention and perception normal. He is attentive. He does not perceive auditory or visual hallucinations.        Mood and Affect: Mood is anxious and depressed.        Speech: Speech normal.        Behavior: Behavior normal.        Thought Content: Thought content is not paranoid or delusional. Thought content includes suicidal ideation. Thought content does not include homicidal ideation. Thought content includes suicidal plan. Thought content does not include homicidal plan.        Cognition and Memory: Cognition normal.   Review of Systems  Constitutional: Negative.   HENT: Negative.    Eyes: Negative.   Respiratory: Negative.    Cardiovascular: Negative.   Gastrointestinal:  Positive for constipation.  Genitourinary: Negative.   Musculoskeletal: Negative.   Skin: Negative.   Neurological: Negative.   Endo/Heme/Allergies: Negative.   Psychiatric/Behavioral:   Positive for depression and suicidal ideas. Negative for hallucinations, memory loss and substance abuse. The patient is not nervous/anxious and does not have insomnia.    Blood pressure 98/72, pulse 72, temperature 98.1 F (36.7 C), resp. rate 18, SpO2 99 %. There is no height or weight on file to calculate BMI.  Past Psychiatric History:  MDD, SI  Is the patient at risk to self? Yes  Has the patient been a risk to self in the past 6 months? Yes .    Has the patient been a risk to self within the distant past? Yes   Is the patient a risk to others? No   Has the patient been a risk to others in the past 6 months? No   Has the patient been a risk to others within the distant past? No   Past Medical History:  Past Medical History:  Diagnosis Date   Anxiety    Depression    Moderate cocaine use disorder (HCC) 02/09/2021   Substance abuse (HCC)    Suicide attempt (HCC)    x 6 per pt   Thyroid disease    hypo    Past Surgical History:  Procedure Laterality Date   punctured lung      Family History:  Family History  Problem Relation Age of Onset   Hyperlipidemia Mother  Cancer Mother    Cancer Father    Diabetes Maternal Grandmother     Social History:  Social History   Socioeconomic History   Marital status: Single    Spouse name: Not on file   Number of children: Not on file   Years of education: 12   Highest education level: High school graduate  Occupational History   Not on file  Tobacco Use   Smoking status: Every Day    Packs/day: 1.00    Types: Cigarettes   Smokeless tobacco: Never  Substance and Sexual Activity   Alcohol use: No   Drug use: No   Sexual activity: Never    Birth control/protection: Abstinence  Other Topics Concern   Not on file  Social History Narrative   Not on file   Social Determinants of Health   Financial Resource Strain: Not on file  Food Insecurity: Not on file  Transportation Needs: Not on file  Physical Activity: Not  on file  Stress: Not on file  Social Connections: Not on file  Intimate Partner Violence: Not on file    SDOH:  SDOH Screenings   Alcohol Screen: Low Risk    Last Alcohol Screening Score (AUDIT): 0  Depression (PHQ2-9): Medium Risk   PHQ-2 Score: 18  Financial Resource Strain: Not on file  Food Insecurity: Not on file  Housing: Not on file  Physical Activity: Not on file  Social Connections: Not on file  Stress: Not on file  Tobacco Use: High Risk   Smoking Tobacco Use: Every Day   Smokeless Tobacco Use: Never  Transportation Needs: Not on file    Last Labs:  Admission on 04/03/2021  Component Date Value Ref Range Status   TSH 04/03/2021 4.449  0.350 - 4.500 uIU/mL Final   Comment: Performed by a 3rd Generation assay with a functional sensitivity of <=0.01 uIU/mL. Performed at [facility], [address], [place], [place] [address]    Cholesterol 04/03/2021 151  0 - 200 mg/dL Final   Triglycerides 60/45/4098 273 (A) <150 mg/dL Final   HDL 11/91/4782 37 (A) >40 mg/dL Final   Total CHOL/HDL Ratio 04/03/2021 4.1  RATIO Final   VLDL 04/03/2021 55 (A) 0 - 40 mg/dL Final   LDL Cholesterol 04/03/2021 59  0 - 99 mg/dL Final   Comment:        Total Cholesterol/HDL:CHD Risk Coronary Heart Disease Risk Table                     Men   Women  1/2 Average Risk   3.4   3.3  Average Risk       5.0   4.4  2 X Average Risk   9.6   7.1  3 X Average Risk  23.4   11.0        Use the calculated Patient Ratio above and the CHD Risk Table to determine the patient's CHD Risk.        ATP III CLASSIFICATION (LDL):  <100     mg/dL   Optimal  956-213  mg/dL   Near or Above                    Optimal  130-159  mg/dL   Borderline  086-578  mg/dL   High  >469     mg/dL   Very High Performed at [facility], [address] [place], [place] [address]   Admission on  04/03/2021, Discharged on 04/03/2021  Component Date Value Ref Range Status    Carbamazepine Lvl 04/03/2021 <2.0 (A) 4.0 - 12.0 ug/mL Final   Performed at [facility], [address], [place], [place] [address]  Admission on 04/02/2021, Discharged on 04/03/2021  Component Date Value Ref Range Status   Sodium 04/02/2021 138  135 - 145 mmol/L Final   Potassium 04/02/2021 3.6  3.5 - 5.1 mmol/L Final   Chloride 04/02/2021 101  98 - 111 mmol/L Final   CO2 04/02/2021 27  22 - 32 mmol/L Final   Glucose, Bld 04/02/2021 106 (A) 70 - 99 mg/dL Final   Glucose reference range applies only to samples taken after fasting for at least 8 hours.   BUN 04/02/2021 10  6 - 20 mg/dL Final   Creatinine, Ser 04/02/2021 0.92  0.61 - 1.24 mg/dL Final   Calcium 74/25/9563 9.8  8.9 - 10.3 mg/dL Final   Total Protein 87/56/4332 7.4  6.5 - 8.1 g/dL Final   Albumin 95/18/8416 4.5  3.5 - 5.0 g/dL Final   AST 60/63/0160 19  15 - 41 U/L Final   ALT 04/02/2021 14  0 - 44 U/L Final   Alkaline Phosphatase 04/02/2021 75  38 - 126 U/L Final   Total Bilirubin 04/02/2021 0.6  0.3 - 1.2 mg/dL Final   GFR, Estimated 04/02/2021 >60  >60 mL/min Final   Comment: (NOTE) Calculated using the CKD-EPI Creatinine Equation (2021)    Anion gap 04/02/2021 10  5 - 15 Final   Performed at [facility], [address], [place], [place] [address]   Alcohol, Ethyl (B) 04/02/2021 <10  <10 mg/dL Final   Comment: (NOTE) Lowest detectable limit for serum alcohol is 10 mg/dL.  For medical purposes only. Performed at [facility], [address] [place], [place] [address]    Salicylate Lvl 04/02/2021 <7.0 (A) 7.0 - 30.0 mg/dL Final   Performed at [facility], [address], [place], [place] [address]   Acetaminophen (Tylenol), Serum 04/02/2021 <10 (A) 10 - 30 ug/mL Final   Comment: (NOTE) Therapeutic concentrations vary significantly. A range of 10-30 ug/mL  may be an effective concentration for many patients. However, some  are best treated at concentrations outside of this  range. Acetaminophen concentrations >150 ug/mL at 4 hours after ingestion  and >50 ug/mL at 12 hours after ingestion are often associated with  toxic reactions.  Performed at [facility], [address] [place], [place] [address]    WBC 04/02/2021 6.8  4.0 - 10.5 K/uL Final   RBC 04/02/2021 4.93  4.22 - 5.81 MIL/uL Final   Hemoglobin 04/02/2021 15.5  13.0 - 17.0 g/dL Final   HCT 23/76/2831 46.8  39.0 - 52.0 % Final   MCV 04/02/2021 94.9  80.0 - 100.0 fL Final   MCH 04/02/2021 31.4  26.0 - 34.0 pg Final   MCHC 04/02/2021 33.1  30.0 - 36.0 g/dL Final   RDW 51/76/1607 13.1  11.5 - 15.5 % Final   Platelets 04/02/2021 320  150 - 400 K/uL Final   nRBC 04/02/2021 0.0  0.0 - 0.2 % Final   Performed at [facility], [address], [place], [place] [address]   Opiates 04/02/2021 NONE DETECTED  NONE DETECTED Final   Cocaine 04/02/2021 NONE DETECTED  NONE DETECTED Final   Benzodiazepines 04/02/2021 NONE DETECTED  NONE DETECTED Final   Amphetamines 04/02/2021 NONE DETECTED  NONE DETECTED Final  Tetrahydrocannabinol 04/02/2021 NONE DETECTED  NONE DETECTED Final   Barbiturates 04/02/2021 NONE DETECTED  NONE DETECTED Final   Comment: (NOTE) DRUG SCREEN FOR MEDICAL PURPOSES ONLY.  IF CONFIRMATION IS NEEDED FOR ANY PURPOSE, NOTIFY LAB WITHIN 5 DAYS.  LOWEST DETECTABLE LIMITS FOR URINE DRUG SCREEN Drug Class                     Cutoff (ng/mL) Amphetamine and metabolites    1000 Barbiturate and metabolites    200 Benzodiazepine                 200 Tricyclics and metabolites     300 Opiates and metabolites        300 Cocaine and metabolites        300 THC                            50 Performed at [facility], [address] [place], [place] [address]    SARS Coronavirus 2 by RT PCR 04/02/2021 NEGATIVE  NEGATIVE Final   Comment: (NOTE) SARS-CoV-2 target nucleic acids are NOT DETECTED.  The SARS-CoV-2 RNA is generally detectable in upper respiratory specimens during  the acute phase of infection. The lowest concentration of SARS-CoV-2 viral copies this assay can detect is 138 copies/mL. A negative result does not preclude SARS-Cov-2 infection and should not be used as the sole basis for treatment or other patient management decisions. A negative result may occur with  improper specimen collection/handling, submission of specimen other than nasopharyngeal swab, presence of viral mutation(s) within the areas targeted by this assay, and inadequate number of viral copies(<138 copies/mL). A negative result must be combined with clinical observations, patient history, and epidemiological information. The expected result is Negative.  Fact Sheet for Patients:  BloggerCourse.com  Fact Sheet for Healthcare Providers:  SeriousBroker.it  This test is no                          t yet approved or cleared by the [place] FDA and  has been authorized for detection and/or diagnosis of SARS-CoV-2 by FDA under an Emergency Use Authorization (EUA). This EUA will remain  in effect (meaning this test can be used) for the duration of the COVID-19 declaration under Section 564(b)(1) of the Act, 21 U.S.C.section 360bbb-3(b)(1), unless the authorization is terminated  or revoked sooner.       Influenza A by PCR 04/02/2021 NEGATIVE  NEGATIVE Final   Influenza B by PCR 04/02/2021 NEGATIVE  NEGATIVE Final   Comment: (NOTE) The Xpert Xpress SARS-CoV-2/FLU/RSV plus assay is intended as an aid in the diagnosis of influenza from Nasopharyngeal swab specimens and should not be used as a sole basis for treatment. Nasal washings and aspirates are unacceptable for Xpert Xpress SARS-CoV-2/FLU/RSV testing.  Fact Sheet for Patients: BloggerCourse.com  Fact Sheet for Healthcare Providers: SeriousBroker.it  This test is not yet approved or cleared by the [place] FDA  and has been authorized for detection and/or diagnosis of SARS-CoV-2 by FDA under an Emergency Use Authorization (EUA). This EUA will remain in effect (meaning this test can be used) for the duration of the COVID-19 declaration under Section 564(b)(1) of the Act, 21 U.S.C. section 360bbb-3(b)(1), unless the authorization is terminated or revoked.  Performed at [facility], [address], [place], [place] [address]   Admission on 02/24/2021, Discharged on 02/25/2021  Component Date Value Ref Range Status   SARS Coronavirus 2 by RT PCR 02/24/2021 NEGATIVE  NEGATIVE Final   Comment: (NOTE) SARS-CoV-2 target nucleic acids are NOT DETECTED.  The SARS-CoV-2 RNA is generally detectable in upper respiratory specimens during the acute phase of infection. The lowest concentration of SARS-CoV-2 viral copies this assay can detect is 138 copies/mL. A negative result does not preclude SARS-Cov-2 infection and should not be used as the sole basis for treatment or other patient management decisions. A negative result may occur with  improper specimen collection/handling, submission of specimen other than nasopharyngeal swab, presence of viral mutation(s) within the areas targeted by this assay, and inadequate number of viral copies(<138 copies/mL). A negative result must be combined with clinical observations, patient history, and epidemiological information. The expected result is Negative.  Fact Sheet for Patients:  BloggerCourse.com  Fact Sheet for Healthcare Providers:  SeriousBroker.it  This test is no                          t yet approved or cleared by the [place] FDA and  has been authorized for detection and/or diagnosis of SARS-CoV-2 by FDA under an Emergency Use Authorization (EUA). This EUA will remain  in effect (meaning this test can be used) for the duration of the COVID-19 declaration under Section 564(b)(1) of  the Act, 21 U.S.C.section 360bbb-3(b)(1), unless the authorization is terminated  or revoked sooner.       Influenza A by PCR 02/24/2021 NEGATIVE  NEGATIVE Final   Influenza B by PCR 02/24/2021 NEGATIVE  NEGATIVE Final   Comment: (NOTE) The Xpert Xpress SARS-CoV-2/FLU/RSV plus assay is intended as an aid in the diagnosis of influenza from Nasopharyngeal swab specimens and should not be used as a sole basis for treatment. Nasal washings and aspirates are unacceptable for Xpert Xpress SARS-CoV-2/FLU/RSV testing.  Fact Sheet for Patients: BloggerCourse.com  Fact Sheet for Healthcare Providers: SeriousBroker.it  This test is not yet approved or cleared by the [place] FDA and has been authorized for detection and/or diagnosis of SARS-CoV-2 by FDA under an Emergency Use Authorization (EUA). This EUA will remain in effect (meaning this test can be used) for the duration of the COVID-19 declaration under Section 564(b)(1) of the Act, 21 U.S.C. section 360bbb-3(b)(1), unless the authorization is terminated or revoked.  Performed at [facility], [address] [place], [place] [address]    Sodium 02/24/2021 138  135 - 145 mmol/L Final   Potassium 02/24/2021 3.7  3.5 - 5.1 mmol/L Final   Chloride 02/24/2021 103  98 - 111 mmol/L Final   CO2 02/24/2021 26  22 - 32 mmol/L Final   Glucose, Bld 02/24/2021 92  70 - 99 mg/dL Final   Glucose reference range applies only to samples taken after fasting for at least 8 hours.   BUN 02/24/2021 13  6 - 20 mg/dL Final   Creatinine, Ser 02/24/2021 0.95  0.61 - 1.24 mg/dL Final   Calcium 60/45/4098 9.2  8.9 - 10.3 mg/dL Final   Total Protein 11/91/4782 7.3  6.5 - 8.1 g/dL Final   Albumin 95/62/1308 4.2  3.5 - 5.0 g/dL Final   AST 65/78/4696 23  15 - 41 U/L Final   ALT 02/24/2021 16  0 - 44 U/L Final   Alkaline Phosphatase 02/24/2021 74  38 - 126 U/L Final   Total Bilirubin  02/24/2021 0.5  0.3 - 1.2 mg/dL Final   GFR,  Estimated 02/24/2021 >60  >60 mL/min Final   Comment: (NOTE) Calculated using the CKD-EPI Creatinine Equation (2021)    Anion gap 02/24/2021 9  5 - 15 Final   Performed at [facility], [address], [place], [place] [address]   Alcohol, Ethyl (B) 02/24/2021 <10  <10 mg/dL Final   Comment: (NOTE) Lowest detectable limit for serum alcohol is 10 mg/dL.  For medical purposes only. Performed at [facility], [address] [place], [place] [address]    Opiates 02/25/2021 NONE DETECTED  NONE DETECTED Final   Cocaine 02/25/2021 NONE DETECTED  NONE DETECTED Final   Benzodiazepines 02/25/2021 POSITIVE (A) NONE DETECTED Final   Amphetamines 02/25/2021 NONE DETECTED  NONE DETECTED Final   Tetrahydrocannabinol 02/25/2021 POSITIVE (A) NONE DETECTED Final   Barbiturates 02/25/2021 NONE DETECTED  NONE DETECTED Final   Comment: (NOTE) DRUG SCREEN FOR MEDICAL PURPOSES ONLY.  IF CONFIRMATION IS NEEDED FOR ANY PURPOSE, NOTIFY LAB WITHIN 5 DAYS.  LOWEST DETECTABLE LIMITS FOR URINE DRUG SCREEN Drug Class                     Cutoff (ng/mL) Amphetamine and metabolites    1000 Barbiturate and metabolites    200 Benzodiazepine                 200 Tricyclics and metabolites     300 Opiates and metabolites        300 Cocaine and metabolites        300 THC                            50 Performed at [facility], [address] [place], [place] [address]    WBC 02/24/2021 7.8  4.0 - 10.5 K/uL Final   RBC 02/24/2021 4.44  4.22 - 5.81 MIL/uL Final   Hemoglobin 02/24/2021 13.9  13.0 - 17.0 g/dL Final   HCT 56/21/3086 40.7  39.0 - 52.0 % Final   MCV 02/24/2021 91.7  80.0 - 100.0 fL Final   MCH 02/24/2021 31.3  26.0 - 34.0 pg Final   MCHC 02/24/2021 34.2  30.0 - 36.0 g/dL Final   RDW 57/84/6962 12.3  11.5 - 15.5 % Final   Platelets 02/24/2021 258  150 - 400 K/uL Final   nRBC 02/24/2021 0.0  0.0 -  0.2 % Final   Neutrophils Relative % 02/24/2021 72  % Final   Neutro Abs 02/24/2021 5.6  1.7 - 7.7 K/uL Final   Lymphocytes Relative 02/24/2021 20  % Final   Lymphs Abs 02/24/2021 1.6  0.7 - 4.0 K/uL Final   Monocytes Relative 02/24/2021 6  % Final   Monocytes Absolute 02/24/2021 0.5  0.1 - 1.0 K/uL Final   Eosinophils Relative 02/24/2021 1  % Final   Eosinophils Absolute 02/24/2021 0.1  0.0 - 0.5 K/uL Final   Basophils Relative 02/24/2021 1  % Final   Basophils Absolute 02/24/2021 0.0  0.0 - 0.1 K/uL Final   Immature Granulocytes 02/24/2021 0  % Final   Abs Immature Granulocytes 02/24/2021 0.01  0.00 - 0.07 K/uL Final   Performed at [facility], [address] [place], [place] [address]   Acetaminophen (Tylenol), Serum 02/24/2021 <10 (A) 10 - 30 ug/mL Final   Comment: (NOTE) Therapeutic concentrations vary significantly. A range of 10-30 ug/mL  may be an effective concentration for many patients. However, some  are best  treated at concentrations outside of this range. Acetaminophen concentrations >150 ug/mL at 4 hours after ingestion  and >50 ug/mL at 12 hours after ingestion are often associated with  toxic reactions.  Performed at [facility], [address] [place], [place] [address]    Salicylate Lvl 02/24/2021 <7.0 (A) 7.0 - 30.0 mg/dL Final   Performed at [facility], [address], [place], [place] [address]   Magnesium 02/24/2021 2.2  1.7 - 2.4 mg/dL Final   Performed at [facility], [address], [place], [place] [address]  Admission on 02/08/2021, Discharged on 02/15/2021  Component Date Value Ref Range Status   Sodium 02/10/2021 138  135 - 145 mmol/L Final   Potassium 02/10/2021 3.8  3.5 - 5.1 mmol/L Final   Chloride 02/10/2021 102  98 - 111 mmol/L Final   CO2 02/10/2021 28  22 - 32 mmol/L Final   Glucose, Bld 02/10/2021 112 (A) 70 - 99 mg/dL Final   Glucose reference range applies  only to samples taken after fasting for at least 8 hours.   BUN 02/10/2021 13  6 - 20 mg/dL Final   Creatinine, Ser 02/10/2021 0.83  0.61 - 1.24 mg/dL Final   Calcium 19/37/9024 9.4  8.9 - 10.3 mg/dL Final   Total Protein 09/73/5329 6.7  6.5 - 8.1 g/dL Final   Albumin 92/42/6834 4.0  3.5 - 5.0 g/dL Final   AST 19/62/2297 17  15 - 41 U/L Final   ALT 02/10/2021 14  0 - 44 U/L Final   Alkaline Phosphatase 02/10/2021 62  38 - 126 U/L Final   Total Bilirubin 02/10/2021 0.4  0.3 - 1.2 mg/dL Final   GFR, Estimated 02/10/2021 >60  >60 mL/min Final   Comment: (NOTE) Calculated using the CKD-EPI Creatinine Equation (2021)    Anion gap 02/10/2021 8  5 - 15 Final   Performed at [place] [facility], [address] [place], [place] [address]   Hgb A1c MFr Bld 02/10/2021 5.6  4.8 - 5.6 % Final   Comment: (NOTE)         Prediabetes: 5.7 - 6.4         Diabetes: >6.4         Glycemic control for adults with diabetes: <7.0    Mean Plasma Glucose 02/10/2021 114  mg/dL Final   Comment: (NOTE) Performed At: [facility] [address] [place], [place] [id number] Jolene Schimke MD KG:[id number]    Cholesterol 02/10/2021 163  0 - 200 mg/dL Final   Triglycerides 02/63/7858 409 (A) <150 mg/dL Final   HDL 85/10/7739 34 (A) >40 mg/dL Final   Total CHOL/HDL Ratio 02/10/2021 4.8  RATIO Final   VLDL 02/10/2021 UNABLE TO CALCULATE IF TRIGLYCERIDE OVER 400 mg/dL  0 - 40 mg/dL Final   LDL Cholesterol 02/10/2021 UNABLE TO CALCULATE IF TRIGLYCERIDE OVER 400 mg/dL  0 - 99 mg/dL Final   Comment:        Total Cholesterol/HDL:CHD Risk Coronary Heart Disease Risk Table                     Men   Women  1/2 Average Risk   3.4   3.3  Average Risk       5.0   4.4  2 X Average Risk   9.6   7.1  3 X Average Risk  23.4   11.0        Use the calculated Patient Ratio above  and the CHD Risk Table to determine the patient's CHD Risk.        ATP III CLASSIFICATION (LDL):  <100     mg/dL   Optimal   409-811  mg/dL   Near or Above                    Optimal  130-159  mg/dL   Borderline  914-782  mg/dL   High  >956     mg/dL   Very High Performed at [facility], [address] [place], [place] [address]    Free T4 02/10/2021 0.80  0.61 - 1.12 ng/dL Final   Comment: (NOTE) Biotin ingestion may interfere with free T4 tests. If the results are inconsistent with the TSH level, previous test results, or the clinical presentation, then consider biotin interference. If needed, order repeat testing after stopping biotin. Performed at [facility], [address], [place], [place] [address]    TSH 02/10/2021 4.672 (A) 0.350 - 4.500 uIU/mL Final   Comment: Performed by a 3rd Generation assay with a functional sensitivity of <=0.01 uIU/mL. Performed at [facility], [address], [place], [place] [address]    Direct LDL 02/10/2021 98.5  0 - 99 mg/dL Final   Performed at [facility], [address] [place], [place] [address]  Admission on 02/07/2021, Discharged on 02/08/2021  Component Date Value Ref Range Status   Sodium 02/07/2021 136  135 - 145 mmol/L Final   Potassium 02/07/2021 3.6  3.5 - 5.1 mmol/L Final   Chloride 02/07/2021 101  98 - 111 mmol/L Final   CO2 02/07/2021 23  22 - 32 mmol/L Final   Glucose, Bld 02/07/2021 123 (A) 70 - 99 mg/dL Final   Glucose reference range applies only to samples taken after fasting for at least 8 hours.   BUN 02/07/2021 19  6 - 20 mg/dL Final   Creatinine, Ser 02/07/2021 1.25 (A) 0.61 - 1.24 mg/dL Final   Calcium 32/44/0102 9.7  8.9 - 10.3 mg/dL Final   Total Protein 72/53/6644 7.4  6.5 - 8.1 g/dL Final   Albumin 03/47/4259 4.4  3.5 - 5.0 g/dL Final   AST 56/38/7564 26  15 - 41 U/L Final   ALT 02/07/2021 15  0 - 44 U/L Final   Alkaline Phosphatase 02/07/2021 70  38 - 126 U/L Final   Total Bilirubin 02/07/2021 0.8  0.3 - 1.2 mg/dL Final   GFR, Estimated 02/07/2021 >60  >60 mL/min Final    Comment: (NOTE) Calculated using the CKD-EPI Creatinine Equation (2021)    Anion gap 02/07/2021 12  5 - 15 Final   Performed at [facility], [address], [place], [place] [address]   Alcohol, Ethyl (B) 02/07/2021 <10  <10 mg/dL Final   Comment: (NOTE) Lowest detectable limit for serum alcohol is 10 mg/dL.  For medical purposes only. Performed at [facility], [address], [place], [place] [address]    Salicylate Lvl 02/07/2021 <7.0 (A) 7.0 - 30.0 mg/dL Final   Performed at [facility], [address] [place], [place] [address]   Acetaminophen (Tylenol), Serum 02/07/2021 <10 (A) 10 - 30 ug/mL Final   Comment: (NOTE) Therapeutic concentrations vary significantly. A range of 10-30 ug/mL  may be an effective concentration for many patients. However, some  are best treated at concentrations outside of this range. Acetaminophen concentrations >150 ug/mL at 4 hours after ingestion  and >50 ug/mL at 12 hours after ingestion are often associated with  toxic reactions.  Performed at [facility], [address], [place], [place] [address]    WBC 02/07/2021 8.8  4.0 - 10.5 K/uL Final   RBC 02/07/2021 4.89  4.22 - 5.81 MIL/uL Final   Hemoglobin 02/07/2021 15.3  13.0 - 17.0 g/dL Final   HCT 60/45/4098 45.6  39.0 - 52.0 % Final   MCV 02/07/2021 93.3  80.0 - 100.0 fL Final   MCH 02/07/2021 31.3  26.0 - 34.0 pg Final   MCHC 02/07/2021 33.6  30.0 - 36.0 g/dL Final   RDW 11/91/4782 12.1  11.5 - 15.5 % Final   Platelets 02/07/2021 286  150 - 400 K/uL Final   nRBC 02/07/2021 0.0  0.0 - 0.2 % Final   Performed at [facility], [address] [place], [place] [address]   Opiates 02/07/2021 NONE DETECTED  NONE DETECTED Final   Cocaine 02/07/2021 POSITIVE (A) NONE DETECTED Final   Benzodiazepines 02/07/2021 NONE DETECTED  NONE DETECTED Final   Amphetamines 02/07/2021 NONE DETECTED  NONE DETECTED Final   Tetrahydrocannabinol 02/07/2021 POSITIVE (A) NONE  DETECTED Final   Barbiturates 02/07/2021 NONE DETECTED  NONE DETECTED Final   Comment: (NOTE) DRUG SCREEN FOR MEDICAL PURPOSES ONLY.  IF CONFIRMATION IS NEEDED FOR ANY PURPOSE, NOTIFY LAB WITHIN 5 DAYS.  LOWEST DETECTABLE LIMITS FOR URINE DRUG SCREEN Drug Class                     Cutoff (ng/mL) Amphetamine and metabolites    1000 Barbiturate and metabolites    200 Benzodiazepine                 200 Tricyclics and metabolites     300 Opiates and metabolites        300 Cocaine and metabolites        300 THC                            50 Performed at [facility], [address] [place], [place] [address]    SARS Coronavirus 2 by RT PCR 02/07/2021 NEGATIVE  NEGATIVE Final   Comment: (NOTE) SARS-CoV-2 target nucleic acids are NOT DETECTED.  The SARS-CoV-2 RNA is generally detectable in upper respiratory specimens during the acute phase of infection. The lowest concentration of SARS-CoV-2 viral copies this assay can detect is 138 copies/mL. A negative result does not preclude SARS-Cov-2 infection and should not be used as the sole basis for treatment or other patient management decisions. A negative result may occur with  improper specimen collection/handling, submission of specimen other than nasopharyngeal swab, presence of viral mutation(s) within the areas targeted by this assay, and inadequate number of viral copies(<138 copies/mL). A negative result must be combined with clinical observations, patient history, and epidemiological information. The expected result is Negative.  Fact Sheet for Patients:  BloggerCourse.com  Fact Sheet for Healthcare Providers:  SeriousBroker.it  This test is no                          t yet approved or cleared by the [place] FDA and  has been authorized for detection and/or diagnosis of SARS-CoV-2 by FDA under an Emergency Use Authorization (EUA). This EUA will remain  in  effect (meaning this test can be used) for the duration of the  COVID-19 declaration under Section 564(b)(1) of the Act, 21 U.S.C.section 360bbb-3(b)(1), unless the authorization is terminated  or revoked sooner.       Influenza A by PCR 02/07/2021 NEGATIVE  NEGATIVE Final   Influenza B by PCR 02/07/2021 NEGATIVE  NEGATIVE Final   Comment: (NOTE) The Xpert Xpress SARS-CoV-2/FLU/RSV plus assay is intended as an aid in the diagnosis of influenza from Nasopharyngeal swab specimens and should not be used as a sole basis for treatment. Nasal washings and aspirates are unacceptable for Xpert Xpress SARS-CoV-2/FLU/RSV testing.  Fact Sheet for Patients: BloggerCourse.com  Fact Sheet for Healthcare Providers: SeriousBroker.it  This test is not yet approved or cleared by the [place] FDA and has been authorized for detection and/or diagnosis of SARS-CoV-2 by FDA under an Emergency Use Authorization (EUA). This EUA will remain in effect (meaning this test can be used) for the duration of the COVID-19 declaration under Section 564(b)(1) of the Act, 21 U.S.C. section 360bbb-3(b)(1), unless the authorization is terminated or revoked.  Performed at [facility], [address], [place], [place] [address]   Admission on 11/11/2020, Discharged on 11/11/2020  Component Date Value Ref Range Status   WBC 11/11/2020 5.9  4.0 - 10.5 K/uL Final   RBC 11/11/2020 4.84  4.22 - 5.81 MIL/uL Final   Hemoglobin 11/11/2020 15.3  13.0 - 17.0 g/dL Final   HCT 60/45/4098 44.7  39.0 - 52.0 % Final   MCV 11/11/2020 92.4  80.0 - 100.0 fL Final   MCH 11/11/2020 31.6  26.0 - 34.0 pg Final   MCHC 11/11/2020 34.2  30.0 - 36.0 g/dL Final   RDW 11/91/4782 11.8  11.5 - 15.5 % Final   Platelets 11/11/2020 236  150 - 400 K/uL Final   nRBC 11/11/2020 0.0  0.0 - 0.2 % Final   Neutrophils Relative % 11/11/2020 69  % Final   Neutro Abs 11/11/2020 4.1  1.7 - 7.7  K/uL Final   Lymphocytes Relative 11/11/2020 21  % Final   Lymphs Abs 11/11/2020 1.3  0.7 - 4.0 K/uL Final   Monocytes Relative 11/11/2020 9  % Final   Monocytes Absolute 11/11/2020 0.5  0.1 - 1.0 K/uL Final   Eosinophils Relative 11/11/2020 0  % Final   Eosinophils Absolute 11/11/2020 0.0  0.0 - 0.5 K/uL Final   Basophils Relative 11/11/2020 1  % Final   Basophils Absolute 11/11/2020 0.1  0.0 - 0.1 K/uL Final   Immature Granulocytes 11/11/2020 0  % Final   Abs Immature Granulocytes 11/11/2020 0.01  0.00 - 0.07 K/uL Final   Performed at [facility], [address], [place], [place] [address]   Sodium 11/11/2020 137  135 - 145 mmol/L Final   Potassium 11/11/2020 4.0  3.5 - 5.1 mmol/L Final   Chloride 11/11/2020 101  98 - 111 mmol/L Final   CO2 11/11/2020 24  22 - 32 mmol/L Final   Glucose, Bld 11/11/2020 99  70 - 99 mg/dL Final   Glucose reference range applies only to samples taken after fasting for at least 8 hours.   BUN 11/11/2020 14  6 - 20 mg/dL Final   Creatinine, Ser 11/11/2020 0.89  0.61 - 1.24 mg/dL Final   Calcium 30/86/5784 9.1  8.9 - 10.3 mg/dL Final   Total Protein 69/62/9528 7.3  6.5 - 8.1 g/dL Final   Albumin 41/32/4401 4.3  3.5 - 5.0 g/dL Final   AST 02/72/5366 16  15 - 41 U/L Final   ALT  11/11/2020 13  0 - 44 U/L Final   Alkaline Phosphatase 11/11/2020 62  38 - 126 U/L Final   Total Bilirubin 11/11/2020 0.9  0.3 - 1.2 mg/dL Final   GFR, Estimated 11/11/2020 >60  >60 mL/min Final   Comment: (NOTE) Calculated using the CKD-EPI Creatinine Equation (2021)    Anion gap 11/11/2020 12  5 - 15 Final   Performed at [facility], [address] [place], [place] [address]   Lipase 11/11/2020 28  11 - 51 U/L Final   Performed at [facility], [address], [place], [place] [address]    Allergies: Patient has no known allergies.  PTA Medications: (Not in a hospital admission)   Medical Decision Making  Patient  unable to contract for safety at this time. Patient will be admitted to [facility] for continuous assessment with follow-up on day shift to determine appropriate disposition.  -review lab results    Recommendations  Based on my evaluation the patient does not appear to have an emergency medical condition.  Maricela Bo, NP 04/03/21  10:31 PM

## 2021-04-04 DIAGNOSIS — F332 Major depressive disorder, recurrent severe without psychotic features: Principal | ICD-10-CM

## 2021-04-04 LAB — HEMOGLOBIN A1C
Hgb A1c MFr Bld: 5.4 % (ref 4.8–5.6)
Mean Plasma Glucose: 108 mg/dL

## 2021-04-04 MED ORDER — GABAPENTIN 100 MG PO CAPS
100.0000 mg | ORAL_CAPSULE | Freq: Three times a day (TID) | ORAL | Status: DC
  Administered 2021-04-04 – 2021-04-05 (×3): 100 mg via ORAL
  Filled 2021-04-04 (×9): qty 1

## 2021-04-04 MED ORDER — FLUOXETINE HCL 20 MG PO CAPS
20.0000 mg | ORAL_CAPSULE | Freq: Every day | ORAL | Status: DC
  Administered 2021-04-04 – 2021-04-08 (×5): 20 mg via ORAL
  Filled 2021-04-04: qty 7
  Filled 2021-04-04 (×7): qty 1

## 2021-04-04 NOTE — Progress Notes (Signed)
   04/04/21 0945  Psych Admission Type (Psych Patients Only)  Admission Status Voluntary  Psychosocial Assessment  Patient Complaints Anxiety;Depression  Eye Contact Brief  Facial Expression Flat  Affect Anxious;Fearful  Speech Logical/coherent  Interaction Assertive  Motor Activity Slow  Appearance/Hygiene Improved  Behavior Characteristics Cooperative  Mood Depressed;Anxious  Thought Process  Coherency WDL  Content WDL  Delusions None reported or observed  Perception WDL  Hallucination None reported or observed  Judgment Poor  Confusion None  Danger to Self  Current suicidal ideation? Passive  Self-Injurious Behavior No self-injurious ideation or behavior indicators observed or expressed   Agreement Not to Harm Self Yes  Description of Agreement verbally contracted for safety  Danger to Others  Danger to Others None reported or observed

## 2021-04-04 NOTE — H&P (Signed)
Psychiatric Admission Assessment Adult  Patient Identification: Christopher Giles MRN:  [id number] Date of Evaluation:  04/04/2021 Chief Complaint:  MDD (major depressive disorder), recurrent episode, severe (HCC) [F33.2] Principal Diagnosis: Severe episode of recurrent major depressive disorder, without psychotic features (HCC) Diagnosis:  Principal Problem:   Severe episode of recurrent major depressive disorder, without psychotic features (HCC)  History of Present Illness:  Pt is 55 year old male with past history significant for MDD, generalized anxiety disorder and Hypothyroidism originally presented to [facility] ED on 04/02/2021 voluntarily for worsening depression and suicidal ideation.  Patient was transferred to [facility] from ED for assessment and continuous observation where he was restarted on home medications.  Patient transferred to [facility] inpatient unit on 04/03/2021.  Patient is seen and examined today for initial intake and evaluation. Patient states he has been dealing with depression for last 25 years which has been worsening for last 6 months.  Patient endorses active suicidal thoughts with a plan of cutting his wrist. He identify financial problems, not being able to work because of depression, bedbugs at home, gastritis as triggers. He reports past 6-7 suicidal attempts by overdosing.  Her last suicidal attempt was last year .He denies  self-cutting behaviors. He states due to financial problems he had not been able to afford his medication and has been rationing his medication for last few weeks and completely out of all medications medications for last 1 week. He had been taking Abilify 5 mg daily, Zoloft 150 mg daily and Trileptal 150 mg twice daily.            . Currently, he endorses suicidal ideation with a intention and plan of cutting his wrist.  He denies homicidal ideation, and visual and auditory hallucination.  He he states he had crawling sensation on his legs because of  trazodone last night. He denies any paranoia.  He endorses depressed mood, poor appetite and sleep, anhedonia, fatigue, low energy, hopelessness, helplessness, decreased concentration, poor memory, and weight loss of 45 pounds in last 6 months..  He denies worthlessness, and feeling guilty.  He reports racing thoughts, irritability, feeling angry sometimes.  He denies manic type high energy episodes with grandiosity, rapid speech and flight of ideas. He reports generalized anxiety which is out of proportion to the situation.  He reports panic attack about once a week where he feels like getting out of situations.  He denies access to guns. Pt denies any problem with law enforcement and any upcoming court dates.  He reports using Cocaine in the past but he stopped it before his last admission. He denies use of Marijuana, and any other street drugs. Pt denies drinking alcohol. He is single and lives alone in [place]. 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 He quit because of his depression and Gastritis.  Pt is anxious, depressed  cooperative, and alert and oriented x4. His speech is normal with normal/low volume. Pt's mood is depressed with depressed affect. He is not responding to internal stimuli. Endorses SI with a intent and plan by cutting his wrist. Denies HI or AVH.   Associated Signs/Symptoms: Depression Symptoms:  depressed mood, anhedonia, insomnia, fatigue, feelings of worthlessness/guilt, difficulty concentrating, hopelessness, impaired memory, recurrent thoughts of death, suicidal thoughts with specific plan, suicidal attempt, anxiety, panic attacks, loss of energy/fatigue, disturbed sleep, weight loss, decreased appetite, Duration of Depression Symptoms: Greater than two weeks  (Hypo) Manic Symptoms:  Impulsivity, Irritable Mood, Racing thoughts Anxiety Symptoms:  Excessive Worry, Panic Symptoms,  Psychotic Symptoms:   None PTSD  Symptoms: NA Total Time spent with patient: 45 minutes  Past Psychiatric History: MDD  Is the patient at risk to self? Yes.    Has the patient been a risk to self in the past 6 months? Yes.    Has the patient been a risk to self within the distant past? Yes.    Is the patient a risk to others? No.  Has the patient been a risk to others in the past 6 months? No.  Has the patient been a risk to others within the distant past? No.   Prior Inpatient Therapy:   Prior Outpatient Therapy:    Alcohol Screening: Patient refused Alcohol Screening Tool:  (NO) 1. How often do you have a drink containing alcohol?: Never 2. How many drinks containing alcohol do you have on a typical day when you are drinking?: 1 or 2 3. How often do you have six or more drinks on one occasion?: Never AUDIT-C Score: 0 4. How often during the last year have you found that you were not able to stop drinking once you had started?: Never 5. How often during the last year have you failed to do what was normally expected from you because of drinking?: Never 6. How often during the last year have you needed a first drink in the morning to get yourself going after a heavy drinking session?: Never 7. How often during the last year have you had a feeling of guilt of remorse after drinking?: Never 8. How often during the last year have you been unable to remember what happened the night before because you had been drinking?: Never 9. Have you or someone else been injured as a result of your drinking?: No 10. Has a relative or friend or a doctor or another health worker been concerned about your drinking or suggested you cut down?: No Alcohol Use Disorder Identification Test Final Score (AUDIT): 0 Substance Abuse History in the last 12 months:  Yes.   cocaine Consequences of Substance Abuse: Negative Previous Psychotropic Medications: Yes  Psychological Evaluations: Yes  Past Medical History:  Past Medical History:   Diagnosis Date   Anxiety    Depression    Moderate cocaine use disorder (HCC) 02/09/2021   Substance abuse (HCC)    Suicide attempt (HCC)    x 6 per pt   Thyroid disease    hypo    Past Surgical History:  Procedure Laterality Date   punctured lung     Family History:  Family History  Problem Relation Age of Onset   Hyperlipidemia Mother    Cancer Mother    Cancer Father    Diabetes Maternal Grandmother    Family Psychiatric  History: Mother -depression and anxiety Tobacco Screening:   Social History:  Social History   Substance and Sexual Activity  Alcohol Use No     Social History   Substance and Sexual Activity  Drug Use No    Additional Social History: Marital status: Single Are you sexually active?: Yes What is your sexual orientation?: Heterosexual Does patient have children?: No                         Allergies:  No Known Allergies Lab Results:  Results for orders placed or performed during the hospital encounter of 04/03/21 (from the past 48 hour(s))  TSH     Status: None   Collection  Time: 04/03/21  6:14 PM  Result Value Ref Range   TSH 4.449 0.350 - 4.500 uIU/mL    Comment: Performed by a 3rd Generation assay with a functional sensitivity of <=0.01 uIU/mL. Performed at [facility], [address], [place], [place] [address]   T4, free     Status: None   Collection Time: 04/03/21  6:14 PM  Result Value Ref Range   Free T4 0.97 0.61 - 1.12 ng/dL    Comment: (NOTE) Biotin ingestion may interfere with free T4 tests. If the results are inconsistent with the TSH level, previous test results, or the clinical presentation, then consider biotin interference. If needed, order repeat testing after stopping biotin. Performed at [facility], [address] [place], [place] [address]   Hemoglobin A1c     Status: None   Collection Time: 04/03/21  6:14 PM  Result Value Ref Range   Hgb A1c MFr Bld 5.4 4.8 - 5.6 %     Comment: (NOTE)         Prediabetes: 5.7 - 6.4         Diabetes: >6.4         Glycemic control for adults with diabetes: <7.0    Mean Plasma Glucose 108 mg/dL    Comment: (NOTE) Performed At: [facility] [address] [place], [place] [id number] Jolene Schimke MD [place]:[id number]   Lipid panel     Status: Abnormal   Collection Time: 04/03/21  6:14 PM  Result Value Ref Range   Cholesterol 151 0 - 200 mg/dL   Triglycerides 482 (H) <150 mg/dL   HDL 37 (L) >50 mg/dL   Total CHOL/HDL Ratio 4.1 RATIO   VLDL 55 (H) 0 - 40 mg/dL   LDL Cholesterol 59 0 - 99 mg/dL    Comment:        Total Cholesterol/HDL:CHD Risk Coronary Heart Disease Risk Table                     Men   Women  1/2 Average Risk   3.4   3.3  Average Risk       5.0   4.4  2 X Average Risk   9.6   7.1  3 X Average Risk  23.4   11.0        Use the calculated Patient Ratio above and the CHD Risk Table to determine the patient's CHD Risk.        ATP III CLASSIFICATION (LDL):  <100     mg/dL   Optimal  037-048  mg/dL   Near or Above                    Optimal  130-159  mg/dL   Borderline  889-169  mg/dL   High  >450     mg/dL   Very High Performed at [facility], [address] [place], [place] [address]     Blood Alcohol level:  Lab Results  Component Value Date   [facility] <10 04/02/2021   ETH <10 02/24/2021    Metabolic Disorder Labs:  Lab Results  Component Value Date   HGBA1C 5.4 04/03/2021   MPG 108 04/03/2021   MPG 114 02/10/2021   No results found for: PROLACTIN Lab Results  Component Value Date   CHOL 151 04/03/2021   TRIG 273 (H) 04/03/2021   HDL 37 (L) 04/03/2021   CHOLHDL 4.1 04/03/2021   VLDL 55 (H)  04/03/2021   LDLCALC 59 04/03/2021   LDLCALC UNABLE TO CALCULATE IF TRIGLYCERIDE OVER 400 mg/dL 40/98/119105/31/2022    Current Medications: Current Facility-Administered Medications  Medication Dose Route Frequency Provider Last Rate Last Admin   acetaminophen (TYLENOL)  tablet 650 mg  650 mg Oral Q6H PRN Karsten Rooda, Lujuana Kapler, MD       alum & mag hydroxide-simeth (MAALOX/MYLANTA) 200-200-20 MG/5ML suspension 30 mL  30 mL Oral Q4H PRN Leone Havenoda, Eastyn Dattilo, MD       famotidine (PEPCID) tablet 20 mg  20 mg Oral QHS Sergio Zawislak, Traci SermonVandana, MD   20 mg at 04/03/21 2124   FLUoxetine (PROZAC) capsule 20 mg  20 mg Oral Daily Behr Cislo, MD   20 mg at 04/04/21 1432   gabapentin (NEURONTIN) capsule 100 mg  100 mg Oral TID Karsten Rooda, Louana Fontenot, MD       hydrOXYzine (ATARAX/VISTARIL) tablet 25 mg  25 mg Oral TID PRN Karsten Rooda, Hasel Janish, MD   25 mg at 04/04/21 1433   levothyroxine (SYNTHROID) tablet 25 mcg  25 mcg Oral Q0600 Karsten Rooda, Cabrini Ruggieri, MD   25 mcg at 04/04/21 0557   loperamide (IMODIUM) capsule 2-4 mg  2-4 mg Oral PRN Claudie ReveringJames, Martha L, MD       LORazepam (ATIVAN) tablet 1 mg  1 mg Oral Q6H PRN Claudie ReveringJames, Martha L, MD       magnesium hydroxide (MILK OF MAGNESIA) suspension 30 mL  30 mL Oral Daily PRN Karsten Rooda, Hailie Searight, MD       multivitamin with minerals tablet 1 tablet  1 tablet Oral Daily Claudie ReveringJames, Martha L, MD   1 tablet at 04/04/21 0944   nicotine (NICODERM CQ - dosed in mg/24 hours) patch 21 mg  21 mg Transdermal Daily Karsten Rooda, Olive Motyka, MD   21 mg at 04/04/21 0943   ondansetron (ZOFRAN-ODT) disintegrating tablet 4 mg  4 mg Oral Q6H PRN Claudie ReveringJames, Martha L, MD       thiamine tablet 100 mg  100 mg Oral Daily Claudie ReveringJames, Martha L, MD   100 mg at 04/04/21 0944   traZODone (DESYREL) tablet 50 mg  50 mg Oral QHS PRN Karsten Rooda, Katelen Luepke, MD   50 mg at 04/03/21 2124   PTA Medications: Medications Prior to Admission  Medication Sig Dispense Refill Last Dose   ARIPiprazole (ABILIFY) 5 MG tablet Take 1 tablet (5 mg total) by mouth daily. For mood control 30 tablet 0    famotidine (PEPCID) 20 MG tablet Take 1 tablet (20 mg total) by mouth at bedtime. For acid reflux 60 tablet 0    hydrOXYzine (ATARAX/VISTARIL) 25 MG tablet Take 1 tablet (25 mg total) by mouth 2 (two) times daily as needed for anxiety. 75 tablet 0    levothyroxine  (SYNTHROID) 25 MCG tablet Take 1 tablet (25 mcg total) by mouth daily at 6 (six) AM. For low functioning thyroid gland 15 tablet 0    Multiple Vitamin (MULTIVITAMIN WITH MINERALS) TABS tablet Take 1 tablet by mouth daily. (May buy from over the counter): Nutritional supplement 1 tablet 0    nicotine (NICODERM CQ - DOSED IN MG/24 HOURS) 21 mg/24hr patch Place 1 patch (21 mg total) onto the skin daily. (May buy from over the counter): For smoking cessation 1 patch 0    OXcarbazepine (TRILEPTAL) 150 MG tablet Take 1 tablet (150 mg total) by mouth 2 (two) times daily. For mood stabilization 60 tablet 0    sertraline (ZOLOFT) 50 MG tablet Take 3 tablets (150 mg total) by mouth daily. For depression 150  tablet 0    silodosin (RAPAFLO) 8 MG CAPS capsule Take 8 mg by mouth daily with breakfast.      tamsulosin (FLOMAX) 0.4 MG CAPS capsule Take 1 capsule (0.4 mg total) by mouth daily. For Prostate health (Patient not taking: No sig reported) 15 capsule 0     Musculoskeletal: Strength & Muscle Tone: within normal limits Gait & Station: normal Patient leans: N/A            Psychiatric Specialty Exam:  Presentation  General Appearance: Appropriate for Environment  Eye Contact:Good  Speech:Clear and Coherent  Speech Volume:Normal  Handedness:Right   Mood and Affect  Mood:Depressed; Anxious  Affect:Congruent; Depressed   Thought Process  Thought Processes:Goal Directed  Duration of Psychotic Symptoms: No data recorded Past Diagnosis of Schizophrenia or Psychoactive disorder: No  Descriptions of Associations:Intact  Orientation:Full (Time, Place and Person)  Thought Content:Logical  Hallucinations:Hallucinations: None  Ideas of Reference:None  Suicidal Thoughts:Suicidal Thoughts: Yes, Active SI Active Intent and/or Plan: With Intent; With Plan  Homicidal Thoughts:Homicidal Thoughts: No   Sensorium  Memory:Immediate Good; Remote Good; Recent  Good  Judgment:Fair  Insight:Good   Executive Functions  Concentration:Good  Attention Span:Good  Recall:Good  Fund of Knowledge:Good  Language:Good   Psychomotor Activity  Psychomotor Activity:Psychomotor Activity: Normal   Assets  Assets:Communication Skills; Financial Resources/Insurance; Housing; Physical Health; Desire for Improvement   Sleep  Sleep:Sleep: Fair Number of Hours of Sleep: 5    Physical Exam: Physical Exam Vitals and nursing note reviewed.  Constitutional:      General: He is not in acute distress.    Appearance: Normal appearance. He is normal weight. He is not ill-appearing, toxic-appearing or diaphoretic.  HENT:     Head: Normocephalic and atraumatic.  Pulmonary:     Effort: Pulmonary effort is normal.  Musculoskeletal:        General: Normal range of motion.  Neurological:     General: No focal deficit present.     Mental Status: He is alert and oriented to person, place, and time.   Review of Systems  Constitutional:  Negative for chills and fever.  HENT:  Negative for hearing loss.   Eyes:  Negative for blurred vision.  Respiratory:  Negative for cough and shortness of breath.   Cardiovascular:  Negative for chest pain.  Gastrointestinal:  Negative for abdominal pain, diarrhea, nausea and vomiting.  Musculoskeletal:  Negative for myalgias.  Skin:  Negative for rash.  Neurological:  Negative for dizziness, tremors and headaches.  Psychiatric/Behavioral:  Positive for depression, memory loss, substance abuse and suicidal ideas. Negative for hallucinations. The patient is nervous/anxious and has insomnia.   Blood pressure 102/67, pulse 65, temperature 98.7 F (37.1 C), resp. rate 17, height 6\' 3"  (1.905 m), weight 78.9 kg, SpO2 100 %. Body mass index is 21.75 kg/m.  Treatment Plan Summary: Pt is 55 year old male with past history significant for MDD, generalized anxiety disorder and Hypothyroidism originally presented to [facility] ED on 04/02/2021 voluntarily for worsening depression and suicidal ideation.   Patient admitted to [facility] inpatient unit on 04/03/2021 for stabilization of symptoms. Labs - CBC CMP - wnl, UDS negative, Lipid panel showed Cholesterol 151, HDL 37, Triglycerides 273, VLDL -55, Glucose 108, Carbamazepine level <1. Acetaminophen level <10, Salicylate level <7 Plan-  -Daily contact with patient to assess and evaluate symptoms and progress in treatment -Monitor Vitals. -Monitor for Suicidal Ideation. -Monitor for withdrawal symptoms. -Monitor for medication side effects. -Patient will participate in the  therapeutic group milieu.    MDD (Recurrent, severe) -Start Prozac 20 mg Daily. -Stop Home meds Abilify, Zoloft, Trileptal.   Generalized Anxiety -Start Neurontin 100 mg TID.  -Continue Hydroxyzine 25 mg TID PRN for Anxiety. Insomnia -Continue trazodone 50 mg QHS PRN for sleep.   Hypothyrodism Synthroid 25 mcg Daily.   Withdrawal symptoms. -CIWA with Ativan PRN If CIWA >10 -Imodium -Nicoderm   Indigestion and Constipation.  -Continue Milk of Magnesia 30 ml PRN Daily for Constipation. -Continue Maalox/Mylanta 30 ml Q4H PRN for Indigestion. -Continue Pepcid 20 mg daily at Bed time.   Observation Level/Precautions:  Continuous Observation 15 minute checks  Laboratory:  CBC CMP - wnl, UDS negative, Lipid panel showed Cholesterol 151, HDL 37, Triglycerides 273, VLDL -55, Glucose 108, Carbamazepine level <1. Acetaminophen level <10, Salicylate level <7    Psychotherapy:  Pt will attend groups  Medications:  Prozac 20 mg daily and Neurontin 100  mg TID. Hydroxyzine, Trazodone, Pepcid, Ativan PRN  Consultations:  None  Discharge Concerns:    Estimated LOS:3-4 days  Other:     Physician Treatment Plan for Primary Diagnosis: Severe episode of recurrent major depressive disorder, without psychotic features (HCC) Long Term Goal(s): Improvement in symptoms so as ready for discharge  Short  Term Goals: Ability to identify changes in lifestyle to reduce recurrence of condition will improve, Ability to verbalize feelings will improve, Ability to disclose and discuss suicidal ideas, Ability to demonstrate self-control will improve, Ability to identify and develop effective coping behaviors will improve, Ability to maintain clinical measurements within normal limits will improve, Compliance with prescribed medications will improve, and Ability to identify triggers associated with substance abuse/mental health issues will improve  Physician Treatment Plan for Secondary Diagnosis: Principal Problem:   Severe episode of recurrent major depressive disorder, without psychotic features (HCC)  Long Term Goal(s): Improvement in symptoms so as ready for discharge  Short Term Goals: Ability to identify changes in lifestyle to reduce recurrence of condition will improve, Ability to verbalize feelings will improve, Ability to disclose and discuss suicidal ideas, Ability to demonstrate self-control will improve, Ability to identify and develop effective coping behaviors will improve, Ability to maintain clinical measurements within normal limits will improve, Compliance with prescribed medications will improve, and Ability to identify triggers associated with substance abuse/mental health issues will improve  I certify that inpatient services furnished can reasonably be expected to improve the patient's condition.    Karsten Ro, MD 7/23/20223:06 PM

## 2021-04-04 NOTE — Progress Notes (Signed)
   D:  Pt presents with High anxiety and depression.  Pt is tearful, fearful, and worried on assessment.  Pt states, "worried I can't go back to my job."  Pt reports haven't been back on job in 3 months.  Pt administered PRN Trazodone per MAR per pt request.  Pt reports floating thoughts of SI, but contracts verbally for safety.  Pt denies HI and AVH.  A:  Labs/Vital monitored; Medication education provided; Pt encouraged to communicate concerns.  R:  Pt remains safe on unit with Q15 minute checks.  Will continue POC.

## 2021-04-04 NOTE — [facility] Counselor (Signed)
Adult Comprehensive Assessment  Patient ID: Christopher Giles, male   DOB: 10-19-1965, 55 y.o.   MRN: [id number]  Information Source: Information source: Patient  Current Stressors:  Patient states their primary concerns and needs for treatment are:: Severe depression and anxiety Patient states their goals for this hospitilization and ongoing recovery are:: "My meds are not working.  I want them to be changed and to start actually benefiting from them." Educational / Learning stressors: Denies stressors Employment / Job issues: Has not lost his job, but they want him healthy before he returns to work. Family Relationships: Feels his siblings continue to be frustrated with him. Financial / Lack of resources (include bankruptcy): With not working for 3 months, he has no income. Housing / Lack of housing: Paying rent is a stress and he is facing eviction.  His brother is going to pay his rent this month. Physical health (include injuries & life threatening diseases): Denies stressors Social relationships: Denies stressors. 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 Bereavement / Loss: Father and mother are both deceased, and he found a friend in his apartment when he checked on the friend out of concern.  Living/Environment/Situation:  Living Arrangements: Alone Living conditions (as described by patient or guardian): Apartment Who else lives in the home?: Alone How long has patient lived in current situation?: 13 years What is atmosphere in current home: Comfortable  Family History:  Marital status: Single Are you sexually active?: Yes What is your sexual orientation?: Heterosexual Does patient have children?: No  Childhood History:  By whom was/is the patient raised?: Both parents Description of patient's relationship with caregiver when they were a child: Good relationship overall with  parents; mother was not very nurturing but cared for him; father was more laid back and easy going Patient's description of current relationship with people who raised him/her: Both are deceased Does patient have siblings?: Yes Number of Siblings: 4 Description of patient's current relationship with siblings: Patient is the youngest; fair relationship with siblings. Did patient suffer any verbal/emotional/physical/sexual abuse as a child?: No Did patient suffer from severe childhood neglect?: No Has patient ever been sexually abused/assaulted/raped as an adolescent or adult?: No Was the patient ever a victim of a crime or a disaster?: No Witnessed domestic violence?: No Has patient been affected by domestic violence as an adult?: No  Education:  Highest grade of school patient has completed: Some college Currently a Consulting civil engineer?: No Learning disability?: No  Employment/Work Situation:   Employment Situation: Employed Where is Patient Currently Employed?: [employer] How Long has Patient Been Employed?: Since 06/2020, but has not worked in 3 months Are You Satisfied With Your Job?: Yes Do You Work More Than One Job?: No Work Stressors: Pt is "on leave" and out of work due to medical issues. Patient's Job has Been Impacted by Current Illness: No What is the Longest Time Patient has Held a Job?: 12 years Where was the Patient Employed at that Time?: restaurant Has Patient ever Been in the [employer]?: No  Financial Resources:   Financial resources: No income Does patient have a Lawyer or guardian?: No  Alcohol/Substance Abuse:   What has been your use of drugs/alcohol within the last 12 months?: Patient had been sober for 13 years, relapsed prior to his last admission at [facility] on cocaine and marijuana.  Has not used again since discharge. If attempted suicide,  did drugs/alcohol play a role in this?: No Alcohol/Substance Abuse Treatment Hx: Past Tx, Inpatient, Past Tx,  Outpatient, Past detox, Attends AA/NA If yes, describe treatment: Several rehab admissions, long-term treatment, [facility] Has alcohol/substance abuse ever caused legal problems?: Yes  Social Support System:   Patient's Community Support System: Poor Describe Community Support System: Some support from sister, brother Type of faith/religion: Lutheran How does patient's faith help to cope with current illness?: Comforting, gives perspective  Leisure/Recreation:   Do You Have Hobbies?: No Leisure and Hobbies: watch TV, play video games, and social outings with others occasionally  Strengths/Needs:   What is the patient's perception of their strengths?: Good thinker, staying sober Patient states they can use these personal strengths during their treatment to contribute to their recovery: Yes Patient states these barriers may affect/interfere with their treatment: None Patient states these barriers may affect their return to the community: None Other important information patient would like considered in planning for their treatment: None  Discharge Plan:   Currently receiving community mental health services: No Patient states concerns and preferences for aftercare planning are: Did not go to his last [facility] appointments after last discharge.  Is amenable to appointments there, states he needs therapy and medication management. Patient states they will know when they are safe and ready for discharge when: When medicines are changed and are working. Does patient have access to transportation?: Yes Does patient have financial barriers related to discharge medications?: Yes Patient description of barriers related to discharge medications: No income, no insurance Will patient be returning to same living situation after discharge?: Yes  Summary/Recommendations:   Summary and Recommendations (to be completed by the evaluator): Patient is a 55yo male who was in this facility in May-June 2022  following a suicide attempt who is rehospitalized stating that his medicines are not working and his depression and anxiety have become severe.  He did not go to his outpatient appointments, had an appointment to see Dr. Evelene Croon but has no income or insurance to pay.  He is amenable to a referral to [facility] for therapy and medication.  He lives alone and is facing eviction because of being behind on his rent.   His brother has agreed to pay his rent this month.  He has a job but has not been working for 3 months due to his mental health condition, says they will let him come back to work at that point.  He had a relapse on cocaine and marijuana prior to his last admission after a 13-year period of sobriety, but states he did not relapse again since his last discharge.  He would benefit from group therapy, psychoeducation, discharge planning, medication management, resource provision, collateral contact and is recommended to follow his aftercare plan at discharge.  Lynnell Chad. 04/04/2021

## 2021-04-04 NOTE — Progress Notes (Signed)
  Rechecked lying BP at 2231, BP 86/58.  Re-hydrated and notified provider. Provider checked pt. Pt not symptomatic.  Provider advised pt to use call bell if dizzy on rising.  Pt agreed.  Will recheck vitals in the AM per Provider.

## 2021-04-04 NOTE — Progress Notes (Addendum)
Notified by nursing staff that patient's blood pressure has been low this evening.  Per chart review/nursing staff, patient's lying blood pressure was 89/53 at 2053 with a pulse of 56 bpm and sitting blood pressure was 95/67 at 2054 with a pulse of 68 bpm.  Per nursing, blood pressure on 2031 recheck was 86/58 lying.  Per nursing, patient was given 2 cups of Gatorade.  Per chart review, patient had some previous lower blood pressures earlier today on 04/05/2019 as well, with patient's previous blood pressures from 04/04/2021 being 102/67 with a pulse of 65 at 1437, 98/66 with pulse of 58 at 1203, and 95/73 with pulse of 66 at 0605.    Patient seen and examined by myself.  Patient not taking any blood pressure medications at this time.  Patient denies headache, lightheadedness, dizziness, loss of consciousness, difficulty with ambulation, difficulty with balance, chest pain, shortness of breath, abdominal pain, nausea, vomiting, or any additional physical symptoms at this time.  Patient denies any feelings of unsteadiness, lightheadedness, or dizziness with ambulation and patient reports that he has been ambulating normally with no complications since he has been at [facility].  Patient's heart regular rate and rhythm, no murmurs, rubs, or gallops noted.  Patient's lungs are clear to auscultation in bilateral anterior and posterior lung fields.  No respiratory distress noted.  Normal respiratory effort.  On exam, it appears that 1 of patient's cups of fluids is empty and patient has another cup of fluids that is full at this time.  On exam, patient is resting comfortably at this time.  Encouraged patient to continue p.o. intake of fluids.  Instructed patient to notify nursing staff if he develops any symptoms or if any issues arise.  Patient verbalizes understanding and agreement of this plan.  Nursing to continue to encourage p.o. fluid intake.  Nursing to continue to monitor the patient per q 15 minute checks for  safety.  Due to patient sleeping and resting comfortably at this time, as well as lack of symptoms, do not believe the patient's low blood pressure is emergent at this time.  Nursing staff to recheck patient's blood pressure on the morning of 04/05/2021.

## 2021-04-04 NOTE — Progress Notes (Signed)
     04/04/21 2053  Vital Signs  Pulse Rate (!) 56  BP (!) 89/53  BP Location Left Arm  BP Method Automatic  Patient Position (if appropriate) Lying      04/04/21 2054  Vital Signs  Pulse Rate 68  BP 95/67  BP Location Left Arm  BP Method Automatic  Patient Position (if appropriate) Sitting    Hydrated pt with 2 cups of Gatorade.  Will recheck BP.

## 2021-04-04 NOTE — Plan of Care (Signed)
  Problem: Education: Goal: Emotional status will improve Outcome: Not Progressing Goal: Mental status will improve Outcome: Not Progressing   Problem: Coping: Goal: Coping ability will improve Outcome: Not Progressing   

## 2021-04-04 NOTE — Progress Notes (Signed)
     04/04/21 2300  Psych Admission Type (Psych Patients Only)  Admission Status Voluntary  Psychosocial Assessment  Patient Complaints Anxiety;Depression  Eye Contact Brief  Facial Expression Flat  Affect Anxious;Fearful  Speech Logical/coherent  Interaction Assertive  Motor Activity Slow  Appearance/Hygiene Improved  Behavior Characteristics Cooperative  Mood Depressed;Anxious  Thought Process  Coherency WDL  Content WDL  Delusions None reported or observed  Perception WDL  Hallucination None reported or observed  Judgment Poor  Confusion None  Danger to Self  Current suicidal ideation? Passive  Self-Injurious Behavior No self-injurious ideation or behavior indicators observed or expressed   Agreement Not to Harm Self Yes  Description of Agreement verbally contracted for safety  Danger to Others  Danger to Others None reported or observed

## 2021-04-04 NOTE — [facility] Group Notes (Signed)
LCSW Group Therapy Note  10/18/2020   10:00-11:00am   Topic:  Anger Triggers and Coping Skills  Participation Level:  Did Not Attend  Description of Group:   In this group, patients learned how to recognize the physical, cognitive, emotional, and behavioral responses they have to anger-provoking situations.  They identified their own common triggers and typical reactions then analyzed how these reactions are possibly beneficial and possibly unhelpful.  Focus was placed on how helpful it is to recognize the underlying emotions to anger in order to address these for more permanent resolution.  Emphasis was also on identifying possible replacement thoughts for the automatic thoughts generated in various situations shared by the group.  Therapeutic Goals: Patients will share situations that commonly incite their anger and how they typically respond Patients will identify how their coping skills work for them and/or against them Patients will explore possible alternative thoughts to their automatic ones Patients will learn that anger itself is normal and that healthier reactions can assist with resolving conflict rather than worsening situations  Summary of Patient Progress:  The patient was invited to group, did not attend.  Therapeutic Modalities:   Cognitive Behavioral Therapy Processing  Omario Ander J Grossman-Orr  

## 2021-04-04 NOTE — Progress Notes (Signed)
   BP sitting 103/75 BP standing 95/73 Hydrated patient with 2 cups of Gatorade   This AM, pt reported, "feeling like his skin was crawling."  Pt attributed symptom to Trazodone.

## 2021-04-05 MED ORDER — QUETIAPINE FUMARATE 50 MG PO TABS
50.0000 mg | ORAL_TABLET | Freq: Every day | ORAL | Status: DC
  Administered 2021-04-05 – 2021-04-06 (×2): 50 mg via ORAL
  Filled 2021-04-05 (×3): qty 1

## 2021-04-05 MED ORDER — GABAPENTIN 300 MG PO CAPS
300.0000 mg | ORAL_CAPSULE | Freq: Three times a day (TID) | ORAL | Status: DC
  Administered 2021-04-05 – 2021-04-08 (×9): 300 mg via ORAL
  Filled 2021-04-05 (×4): qty 1
  Filled 2021-04-05: qty 21
  Filled 2021-04-05 (×6): qty 1
  Filled 2021-04-05: qty 21
  Filled 2021-04-05 (×2): qty 1
  Filled 2021-04-05: qty 21
  Filled 2021-04-05: qty 1

## 2021-04-05 NOTE — [facility] Group Notes (Signed)
[facility] Group Notes: (Clinical Social Work)   04/05/2021      Type of Therapy:  Group Therapy   Participation Level:  Did Not Attend - was invited both individually by [facility] and by overhead announcement, chose not to attend.   Ambrose Mantle, LCSW 04/05/2021, 12:10 PM

## 2021-04-05 NOTE — Progress Notes (Signed)
[facility]  04/05/2021 10:57 AM Christopher BanningJames C Wotton  MRN:  [id number]  Subjective:  Pt is seen and examined today. Pt states his mood is Depressed and he feels worse than yesterday.  Pt rates his mood at 1/10 (10 is the best mood). Pt states he did not sleep well last night and was up and had a lot of racing thoughts. Nursing notes indicate that Pt slept for 6.75 hours. Pt states his appetite is fair and he has been eating all his meals. Pt rates his anxiety at 10/10 (10 is the worst anxiety) Currently, Pt endorses active suicidal ideation with a plan of cutting his wrist..  He denies homicidal ideation and, visual and auditory hallucination. Pt denies any headache, nausea, vomiting, dizziness, chest pain, SOB, abdominal pain, diarrhea, and constipation. Pt denies any medication side effects and has been tolerating it well. Pt denies any concerns.  Principal Problem: Severe episode of recurrent major depressive disorder, without psychotic features (HCC) Diagnosis: Principal Problem:   Severe episode of recurrent major depressive disorder, without psychotic features (HCC)  Total Time spent with patient: 20 minutes  Past Psychiatric History: MDD, [facility]21 for depression with SI and was discharged home on Abilify 5mg  daily for mood control, Trileptal 150mg  bid for mood stabilization, Zoloft 50mg  for depression, Vistaril 25mg  bid PRN for anxiety, and Xanax 0.5mg  bid for anxiety  Past Medical History:  Past Medical History:  Diagnosis Date   Anxiety    Depression    Moderate cocaine use disorder (HCC) 02/09/2021   Substance abuse (HCC)    Suicide attempt (HCC)    x 6 per pt   Thyroid disease    hypo    Past Surgical History:  Procedure Laterality Date   punctured lung     Family History:  Family History  Problem Relation Age of Onset   Hyperlipidemia Mother    Cancer Mother    Cancer Father    Diabetes Maternal Grandmother    Family  Psychiatric  History: Mother -depression and anxiety Social History:  Social History   Substance and Sexual Activity  Alcohol Use No     Social History   Substance and Sexual Activity  Drug Use No    Social History   Socioeconomic History   Marital status: Single    Spouse name: Not on file   Number of children: Not on file   Years of education: 12   Highest education level: High school graduate  Occupational History   Not on file  Tobacco Use   Smoking status: Every Day    Packs/day: 1.00    Types: Cigarettes   Smokeless tobacco: Never  Substance and Sexual Activity   Alcohol use: No   Drug use: No   Sexual activity: Never    Birth control/protection: Abstinence  Other Topics Concern   Not on file  Social History Narrative   Not on file   Social Determinants of Health   Financial Resource Strain: Not on file  Food Insecurity: Not on file  Transportation Needs: Not on file  Physical Activity: Not on file  Stress: Not on file  Social Connections: Not on file   Additional Social History:                         Sleep: Fair  Appetite:  Fair  Current Medications: Current Facility-Administered Medications  Medication Dose Route Frequency Provider  Last Rate Last Admin   acetaminophen (TYLENOL) tablet 650 mg  650 mg Oral Q6H PRN Madysyn Hanken, MD       alum & mag hydroxide-simeth (MAALOX/MYLANTA) 200-200-20 MG/5ML suspension 30 mL  30 mL Oral Q4H PRN Leone Haven, Kainoah Bartosiewicz, MD       famotidine (PEPCID) tablet 20 mg  20 mg Oral QHS Karsten Ro, MD   20 mg at 04/04/21 2112   FLUoxetine (PROZAC) capsule 20 mg  20 mg Oral Daily Karsten Ro, MD   20 mg at 04/05/21 0804   gabapentin (NEURONTIN) capsule 100 mg  100 mg Oral TID Karsten Ro, MD   100 mg at 04/05/21 0805   hydrOXYzine (ATARAX/VISTARIL) tablet 25 mg  25 mg Oral TID PRN Karsten Ro, MD   25 mg at 04/04/21 2113   levothyroxine (SYNTHROID) tablet 25 mcg  25 mcg Oral Q0600 Karsten Ro, MD   25 mcg  at 04/05/21 1856   loperamide (IMODIUM) capsule 2-4 mg  2-4 mg Oral PRN Claudie Revering, MD       LORazepam (ATIVAN) tablet 1 mg  1 mg Oral Q6H PRN Claudie Revering, MD       magnesium hydroxide (MILK OF MAGNESIA) suspension 30 mL  30 mL Oral Daily PRN Karsten Ro, MD       multivitamin with minerals tablet 1 tablet  1 tablet Oral Daily Claudie Revering, MD   1 tablet at 04/05/21 0804   nicotine (NICODERM CQ - dosed in mg/24 hours) patch 21 mg  21 mg Transdermal Daily Karsten Ro, MD   21 mg at 04/05/21 0806   ondansetron (ZOFRAN-ODT) disintegrating tablet 4 mg  4 mg Oral Q6H PRN Claudie Revering, MD       thiamine tablet 100 mg  100 mg Oral Daily Claudie Revering, MD   100 mg at 04/05/21 0804   traZODone (DESYREL) tablet 50 mg  50 mg Oral QHS PRN Karsten Ro, MD   50 mg at 04/03/21 2124    Lab Results:  Results for orders placed or performed during the hospital encounter of 04/03/21 (from the past 48 hour(s))  TSH     Status: None   Collection Time: 04/03/21  6:14 PM  Result Value Ref Range   TSH 4.449 0.350 - 4.500 uIU/mL    Comment: Performed by a 3rd Generation assay with a functional sensitivity of <=0.01 uIU/mL. Performed at [facility], [address] [place], [place] [address]   T4, free     Status: None   Collection Time: 04/03/21  6:14 PM  Result Value Ref Range   Free T4 0.97 0.61 - 1.12 ng/dL    Comment: (NOTE) Biotin ingestion may interfere with free T4 tests. If the results are inconsistent with the TSH level, previous test results, or the clinical presentation, then consider biotin interference. If needed, order repeat testing after stopping biotin. Performed at [facility], [address], [place], [place] [address]   Hemoglobin A1c     Status: None   Collection Time: 04/03/21  6:14 PM  Result Value Ref Range   Hgb A1c MFr Bld 5.4 4.8 - 5.6 %    Comment: (NOTE)         Prediabetes: 5.7 - 6.4         Diabetes: >6.4         Glycemic  control for adults with diabetes: <7.0    Mean Plasma Glucose 108 mg/dL  Comment: (NOTE) Performed At: [facility] [place] [address] [place], [place] [id number] Jolene Schimke MD [id number]   Lipid panel     Status: Abnormal   Collection Time: 04/03/21  6:14 PM  Result Value Ref Range   Cholesterol 151 0 - 200 mg/dL   Triglycerides 998 (H) <150 mg/dL   HDL 37 (L) >33 mg/dL   Total CHOL/HDL Ratio 4.1 RATIO   VLDL 55 (H) 0 - 40 mg/dL   LDL Cholesterol 59 0 - 99 mg/dL    Comment:        Total Cholesterol/HDL:CHD Risk Coronary Heart Disease Risk Table                     Men   Women  1/2 Average Risk   3.4   3.3  Average Risk       5.0   4.4  2 X Average Risk   9.6   7.1  3 X Average Risk  23.4   11.0        Use the calculated Patient Ratio above and the CHD Risk Table to determine the patient's CHD Risk.        ATP III CLASSIFICATION (LDL):  <100     mg/dL   Optimal  825-053  mg/dL   Near or Above                    Optimal  130-159  mg/dL   Borderline  976-734  mg/dL   High  >193     mg/dL   Very High Performed at [facility], [address] [place], [place] [address]     Blood Alcohol level:  Lab Results  Component Value Date   ETH <10 04/02/2021   ETH <10 02/24/2021    Metabolic Disorder Labs: Lab Results  Component Value Date   HGBA1C 5.4 04/03/2021   MPG 108 04/03/2021   MPG 114 02/10/2021   No results found for: PROLACTIN Lab Results  Component Value Date   CHOL 151 04/03/2021   TRIG 273 (H) 04/03/2021   HDL 37 (L) 04/03/2021   CHOLHDL 4.1 04/03/2021   VLDL 55 (H) 04/03/2021   LDLCALC 59 04/03/2021   LDLCALC UNABLE TO CALCULATE IF TRIGLYCERIDE OVER 400 mg/dL 09/73/5329    Physical Findings: AIMS:  , ,  ,  ,    CIWA:  CIWA-Ar Total: 5 COWS:     Musculoskeletal: Strength & Muscle Tone: within normal limits Gait & Station: normal Patient leans: N/A  Psychiatric Specialty Exam:  Presentation  General  Appearance: Appropriate for Environment  Eye Contact:Good  Speech:Clear and Coherent  Speech Volume:Normal  Handedness:Right   Mood and Affect  Mood:Depressed; Anxious  Affect:Congruent; Depressed   Thought Process  Thought Processes:Goal Directed  Descriptions of Associations:Intact  Orientation:Full (Time, Place and Person)  Thought Content:Logical  History of Schizophrenia/Schizoaffective disorder:No  Duration of Psychotic Symptoms:No data recorded Hallucinations:Hallucinations: None  Ideas of Reference:None  Suicidal Thoughts:Suicidal Thoughts: Yes, Active SI Active Intent and/or Plan: With Intent; With Plan  Homicidal Thoughts:Homicidal Thoughts: No   Sensorium  Memory:Immediate Good; Remote Good; Recent Good  Judgment:Fair  Insight:Good   Executive Functions  Concentration:Good  Attention Span:Good  Recall:Good  Fund of Knowledge:Good  Language:Good   Psychomotor Activity  Psychomotor Activity:Psychomotor Activity: Normal   Assets  Assets:Communication Skills; Financial Resources/Insurance; Housing; Physical Health; Desire for Improvement   Sleep  Sleep:Sleep: Fair Number of Hours of Sleep: 6.75  Physical Exam: Physical Exam Vitals and nursing note reviewed.  Constitutional:      General: He is not in acute distress.    Appearance: Normal appearance. He is not ill-appearing, toxic-appearing or diaphoretic.  HENT:     Head: Normocephalic and atraumatic.  Pulmonary:     Effort: Pulmonary effort is normal.  Neurological:     General: No focal deficit present.     Mental Status: He is alert and oriented to person, place, and time.  Psychiatric:        Behavior: Behavior normal.   Review of Systems  Constitutional:  Negative for chills and fever.  HENT:  Negative for hearing loss.   Eyes:  Negative for blurred vision.  Respiratory:  Negative for cough.   Cardiovascular:  Negative for chest pain.  Gastrointestinal:   Negative for abdominal pain, nausea and vomiting.  Musculoskeletal:  Negative for myalgias.  Skin:  Negative for rash.  Neurological:  Negative for dizziness and headaches.  Psychiatric/Behavioral:  Positive for depression and suicidal ideas. Negative for hallucinations and memory loss. The patient is nervous/anxious and has insomnia.   Blood pressure 101/66, pulse 71, temperature 97.7 F (36.5 C), temperature source Oral, resp. rate 17, height 6\' 3"  (1.905 m), weight 78.9 kg, SpO2 100 %. Body mass index is 21.75 kg/m.   Treatment Plan Summary: Pt is 55 year old male with past history significant for MDD, generalized anxiety disorder and Hypothyroidism originally presented to [facility] ED on 04/02/2021 voluntarily for worsening depression and suicidal ideation.   Patient admitted to [facility] inpatient unit on 04/03/2021 for stabilization of symptoms.  Objective -Patient continues to report worsening depression symptoms with active SI.  Continues to endorse racing thoughts. Patient slept 6.75 hours last night.  Patient received as needed hydroxyzine yesterday.  Blood pressure low at night 89/53 mmHg ( lying ) and 95/67 mmHg (sitting).  Patient was asymptomatic. Morning BP 82/66 mmHg, repeat blood pressure 101/66 mmHg. last CIWA 5 (anxiety) at 6 AM this morning.  Patient was started on Prozac and Neurontin yesterday.  No new labs today.  Labs - CBC CMP - wnl, UDS negative, Lipid panel showed Cholesterol 151, HDL 37, Triglycerides 273, VLDL -55, Glucose 108, Carbamazepine level <1. Acetaminophen level <10, Salicylate level <7  Plan-  -Daily contact with patient to assess and evaluate symptoms and progress in treatment -Monitor Vitals. -Monitor for Suicidal Ideation. -Monitor for withdrawal symptoms. -Monitor for medication side effects. -Patient will participate in the therapeutic group milieu.    MDD (Recurrent, severe) -Continue Prozac 20 mg Daily. -Start Seroquel 50 mg nightly for racing  thoughts.  Generalized Anxiety -Increase Neurontin to 300 mg TID.  -Continue Hydroxyzine 25 mg TID PRN for Anxiety.  Insomnia -Continue trazodone 50 mg QHS PRN for sleep.   Hypothyrodism Synthroid 25 mcg Daily.   Withdrawal symptoms. -CIWA with Ativan PRN If CIWA >10 -Imodium -Nicoderm   Indigestion and Constipation.  -Continue Milk of Magnesia 30 ml PRN Daily for Constipation. -Continue Maalox/Mylanta 30 ml Q4H PRN for Indigestion. -Continue Pepcid 20 mg daily at Bed time.   04/05/2021, MD 04/05/2021, 10:57 AM

## 2021-04-05 NOTE — Progress Notes (Signed)
     04/05/21 2142  Psych Admission Type (Psych Patients Only)  Admission Status Voluntary  Psychosocial Assessment  Patient Complaints Anxiety;Depression  Eye Contact Brief  Facial Expression Flat  Affect Anxious;Fearful  Speech Logical/coherent  Interaction Assertive  Motor Activity Slow  Appearance/Hygiene Improved  Behavior Characteristics Cooperative  Mood Depressed;Anxious  Thought Process  Coherency WDL  Content WDL  Delusions None reported or observed  Perception WDL  Hallucination None reported or observed  Judgment Poor  Confusion None  Danger to Self  Current suicidal ideation? Passive  Self-Injurious Behavior No self-injurious ideation or behavior indicators observed or expressed   Agreement Not to Harm Self Yes  Description of Agreement verbally contracted for safety  Danger to Others  Danger to Others None reported or observed

## 2021-04-05 NOTE — Progress Notes (Signed)
Pt refused lunchtime vital signs.

## 2021-04-05 NOTE — Progress Notes (Signed)
Pt is alert and oriented to person, place, time and situation. Pt is calm, cooperative, denies homicidal ideation, reports suicidal ideation, passively without plans or intent this morning. Pt has a flat affect, is social with his roommate. Isolative to his room. Requires encouragement to come out of his room to take medications. Pt reports feelings of depression and anxiety, rating both of those feelings 5/10 on a 0-10 scale, 10 being worst. Pt contracts verbally not to harm self on the unit and to come talk to staff if that changes. Will continue to monitor pt per Q15 minute face checks and monitor for safety and progress.

## 2021-04-05 NOTE — Progress Notes (Signed)
     04/05/21 0637  Vital Signs  Temp 97.7 F (36.5 C)  Temp Source Oral  Pulse Rate 69  BP 93/70  BP Location Right Arm  BP Method Automatic  Patient Position (if appropriate) Sitting     04/05/21 0638  Vital Signs  Pulse Rate 89  BP (!) 82/66  BP Location Right Arm  BP Method Automatic  Patient Position (if appropriate) Standing    Provider notified of morning BP.  Advised hydrated pt with Gatorade. Pt denies any dizziness or lightheadedness.  Will continue to monitor and advise day shift RN.

## 2021-04-05 NOTE — Plan of Care (Signed)
  Problem: Education: Goal: Emotional status will improve Outcome: Not Progressing Goal: Mental status will improve Outcome: Not Progressing   Problem: Activity: Goal: Interest or engagement in activities will improve Outcome: Not Progressing   

## 2021-04-06 NOTE — Progress Notes (Signed)
Recreation Therapy Notes  Date:  7.25.22 Time: 0930 Location: [address] Dayroom  Group Topic: Stress Management  Goal Area(s) Addresses:  Patient will identify positive stress management techniques. Patient will identify benefits of using stress management post d/c.  Behavioral Response: Appropriate  Intervention: Stress Management  Activity : Meditation.  LRT played a meditation that focused on being indecisive.  Meditation talked about being okay with the decisions you make even if they turn out to be the wrong decision, there is still room to learn from them.   Education:  Stress Management, Discharge Planning.   Education Outcome: Acknowledges Education  Clinical Observations/Feedback: Pt came in for the last few minutes of group.  Pt had no questions or concerns.    Caroll Rancher, LRT/CTRS         Caroll Rancher A 04/06/2021 11:46 AM

## 2021-04-06 NOTE — [facility] Group Notes (Signed)
Type of Therapy and Topic: Group Therapy: Anger Management   Participation Level:  Active  Description of Group: In this group, patients will learn helpful strategies and techniques to manage anger, express anger in alternative ways, change hostile attitudes, and prevent aggressive acts, such as verbal abuse and violence.This group will be process-oriented and eductional, with patients participating in exploration of their own experiences as well as giving and receiving support and challenge from other group members.  Therapeutic Goals: Patient will learn to manage anger. Patient will learn to stop violence or the threat of violence. Patient will learn to develop self control over thoughts and actions. Patient will receive support and feedback from others  Summary of Patient Progress:Pt shared that triggers for his anger are disrespect. Pt's warning signs are twitching.    Therapeutic Modalities: Cognitive Behavioral Therapy Solution Focused Therapy Motivational Interviewing  Fredirick Lathe, LCSWA Clinicial Social Worker [employer]

## 2021-04-06 NOTE — [facility] Suicide Risk Assessment (Signed)
[facility] INPATIENT:  Family/Significant Other Suicide Prevention Education  Suicide Prevention Education:  Education Completed; Veva Holes [phone] (Sister) has been identified by the patient as the family member/significant other with whom the patient will be residing, and identified as the person(s) who will aid the patient in the event of a mental health crisis (suicidal ideations/suicide attempt).  With written consent from the patient, the family member/significant other has been provided the following suicide prevention education, prior to the and/or following the discharge of the patient.  The suicide prevention education provided includes the following: Suicide risk factors Suicide prevention and interventions National Suicide Hotline telephone number [facility] assessment telephone number [facility] Emergency Assistance 911 [facility] and/or Residential Mobile Crisis Unit telephone number  Request made of family/significant other to: Remove weapons (e.g., guns, rifles, knives), all items previously/currently identified as safety concern.   Remove drugs/medications (over-the-counter, prescriptions, illicit drugs), all items previously/currently identified as a safety concern.  The family member/significant other verbalizes understanding of the suicide prevention education information provided.  The family member/significant other agrees to remove the items of safety concern listed above.  CSW spoke with Mrs. Aldridge who states that her brother is having issues with substance use and has been using Cocaine regularly since his last admission.  Mrs. Dayna Barker states that she also believes that her brother has not been taking his medications.  She states that she and their other brother have paid for his next appointment to see Dr. Janace Hoard and they have paid for his rent for one more month.  She states that her brother's rent is not expensive and that if he is being  evicted it is because he has bed bugs and will not clean out the home to let the professionals spray the home.  Mrs. Dayna Barker states that her brother often uses substances when he has "Idol time".  She states that there are no firearms or weapons in the home.  Mrs. Dayna Barker states that she and their other brother will continue to work with their brother but will need him to call them so they have the information about what is needed.  CSW completed SPE with Mrs. Aldridge.   Metro Kung Kataleya Zaugg 04/06/2021, 10:54 AM

## 2021-04-06 NOTE — Progress Notes (Addendum)
   D:  Pt presents with moderate anxiety and high depression.  Pt also complains of a rash on his left leg that is worsening per the pt.  Pt reports, "rash was there before I was admitted, probably related to the bed bugs."  On assessment, the rash appears scattered with erythema. Pt reports, "the rash is itching."  Provider notified.  A:  Labs/Vitals monitored; Medication education provided; Pt encouraged to communicate concerns.  R:  Pt remains safe on the unit with Q15 minute safety checks.  Will continue POC.     04/06/21 2115  Psych Admission Type (Psych Patients Only)  Admission Status Voluntary  Psychosocial Assessment  Patient Complaints Anxiety;Depression;Other (Comment) (Rash on left leg)  Eye Contact Brief  Facial Expression Flat  Affect Anxious;Fearful  Speech Logical/coherent  Interaction Assertive  Motor Activity Slow  Appearance/Hygiene Improved  Behavior Characteristics Cooperative;Calm  Mood Depressed;Anxious  Thought Process  Coherency WDL  Content WDL  Delusions None reported or observed  Perception WDL  Hallucination None reported or observed  Judgment Poor  Confusion None  Danger to Self  Current suicidal ideation? Denies  Self-Injurious Behavior No self-injurious ideation or behavior indicators observed or expressed   Agreement Not to Harm Self Yes  Description of Agreement verbally contracted for safety  Danger to Others  Danger to Others None reported or observed

## 2021-04-06 NOTE — Progress Notes (Signed)
Adult Psychoeducational Group Note  Date:  04/06/2021 Time:  12:58 AM  Group Topic/Focus:  Wrap-Up Group:   The focus of this group is to help patients review their daily goal of treatment and discuss progress on daily workbooks.  Participation Level:  Did Not Attend  Participation Quality:   Did Not Attend  Affect:   Did Not Attend  Cognitive:   Did Not Attend  Insight: None  Engagement in Group:  Did Not Attend  Modes of Intervention:   Did Not Attend  Additional Comments:  Pt did not attend evening wrap up group tonight.  Felipa Furnace 04/06/2021, 12:58 AM

## 2021-04-06 NOTE — Tx Team (Signed)
Interdisciplinary Treatment and Diagnostic Plan Update  04/06/2021 Time of Session: 1:25pm Christopher Giles MRN: [id number]  Principal Diagnosis: Severe episode of recurrent major depressive disorder, without psychotic features ([place])  Secondary Diagnoses: Principal Problem:   Severe episode of recurrent major depressive disorder, without psychotic features ([place])   Current Medications:  Current Facility-Administered Medications  Medication Dose Route Frequency Provider Last Rate Last Admin   acetaminophen (TYLENOL) tablet 650 mg  650 mg Oral Q6H PRN Armando Reichert, MD       alum & mag hydroxide-simeth (MAALOX/MYLANTA) 200-200-20 MG/5ML suspension 30 mL  30 mL Oral Q4H PRN Rosita Kea, Vandana, MD       famotidine (PEPCID) tablet 20 mg  20 mg Oral QHS Armando Reichert, MD   20 mg at 04/05/21 2143   FLUoxetine (PROZAC) capsule 20 mg  20 mg Oral Daily Doda, Vandana, MD   20 mg at 04/06/21 0900   gabapentin (NEURONTIN) capsule 300 mg  300 mg Oral TID Armando Reichert, MD   300 mg at 04/06/21 1259   hydrOXYzine (ATARAX/VISTARIL) tablet 25 mg  25 mg Oral TID PRN Armando Reichert, MD   25 mg at 04/06/21 0617   levothyroxine (SYNTHROID) tablet 25 mcg  25 mcg Oral Q0600 Armando Reichert, MD   25 mcg at 04/06/21 0616   loperamide (IMODIUM) capsule 2-4 mg  2-4 mg Oral PRN Arthor Captain, MD       LORazepam (ATIVAN) tablet 1 mg  1 mg Oral Q6H PRN Arthor Captain, MD       magnesium hydroxide (MILK OF MAGNESIA) suspension 30 mL  30 mL Oral Daily PRN Armando Reichert, MD       multivitamin with minerals tablet 1 tablet  1 tablet Oral Daily Arthor Captain, MD   1 tablet at 04/06/21 0900   nicotine (NICODERM CQ - dosed in mg/24 hours) patch 21 mg  21 mg Transdermal Daily Doda, Vandana, MD   21 mg at 04/06/21 0900   ondansetron (ZOFRAN-ODT) disintegrating tablet 4 mg  4 mg Oral Q6H PRN Arthor Captain, MD       QUEtiapine (SEROQUEL) tablet 50 mg  50 mg Oral QHS Armando Reichert, MD   50 mg at 04/05/21 2142   thiamine tablet 100 mg   100 mg Oral Daily Arthor Captain, MD   100 mg at 04/06/21 0900   PTA Medications: Medications Prior to Admission  Medication Sig Dispense Refill Last Dose   ARIPiprazole (ABILIFY) 5 MG tablet Take 1 tablet (5 mg total) by mouth daily. For mood control 30 tablet 0    famotidine (PEPCID) 20 MG tablet Take 1 tablet (20 mg total) by mouth at bedtime. For acid reflux 60 tablet 0    hydrOXYzine (ATARAX/VISTARIL) 25 MG tablet Take 1 tablet (25 mg total) by mouth 2 (two) times daily as needed for anxiety. 75 tablet 0    levothyroxine (SYNTHROID) 25 MCG tablet Take 1 tablet (25 mcg total) by mouth daily at 6 (six) AM. For low functioning thyroid gland 15 tablet 0    Multiple Vitamin (MULTIVITAMIN WITH MINERALS) TABS tablet Take 1 tablet by mouth daily. (May buy from over the counter): Nutritional supplement 1 tablet 0    nicotine (NICODERM CQ - DOSED IN MG/24 HOURS) 21 mg/24hr patch Place 1 patch (21 mg total) onto the skin daily. (May buy from over the counter): For smoking cessation 1 patch 0    OXcarbazepine (TRILEPTAL) 150 MG tablet Take 1 tablet (150 mg total)  by mouth 2 (two) times daily. For mood stabilization 60 tablet 0    sertraline (ZOLOFT) 50 MG tablet Take 3 tablets (150 mg total) by mouth daily. For depression 150 tablet 0    silodosin (RAPAFLO) 8 MG CAPS capsule Take 8 mg by mouth daily with breakfast.      tamsulosin (FLOMAX) 0.4 MG CAPS capsule Take 1 capsule (0.4 mg total) by mouth daily. For Prostate health (Patient not taking: No sig reported) 15 capsule 0     Patient Stressors: Financial difficulties Medication change or noncompliance  Patient Strengths: Ability for insight Capable of independent living Communication skills Motivation for treatment/growth Supportive family/friends  Treatment Modalities: Medication Management, Group therapy, Case management,  1 to 1 session with clinician, Psychoeducation, Recreational therapy.   Physician Treatment Plan for Primary  Diagnosis: Severe episode of recurrent major depressive disorder, without psychotic features ([place]) Long Term Goal(s): Improvement in symptoms so as ready for discharge   Short Term Goals: Ability to identify changes in lifestyle to reduce recurrence of condition will improve Ability to verbalize feelings will improve Ability to disclose and discuss suicidal ideas Ability to demonstrate self-control will improve Ability to identify and develop effective coping behaviors will improve Ability to maintain clinical measurements within normal limits will improve Compliance with prescribed medications will improve Ability to identify triggers associated with substance abuse/mental health issues will improve  Medication Management: Evaluate patient's response, side effects, and tolerance of medication regimen.  Therapeutic Interventions: 1 to 1 sessions, Unit Group sessions and Medication administration.  Evaluation of Outcomes: Not Met  Physician Treatment Plan for Secondary Diagnosis: Principal Problem:   Severe episode of recurrent major depressive disorder, without psychotic features ([place])  Long Term Goal(s): Improvement in symptoms so as ready for discharge   Short Term Goals: Ability to identify changes in lifestyle to reduce recurrence of condition will improve Ability to verbalize feelings will improve Ability to disclose and discuss suicidal ideas Ability to demonstrate self-control will improve Ability to identify and develop effective coping behaviors will improve Ability to maintain clinical measurements within normal limits will improve Compliance with prescribed medications will improve Ability to identify triggers associated with substance abuse/mental health issues will improve     Medication Management: Evaluate patient's response, side effects, and tolerance of medication regimen.  Therapeutic Interventions: 1 to 1 sessions, Unit Group sessions and Medication  administration.  Evaluation of Outcomes: Not Met   RN Treatment Plan for Primary Diagnosis: Severe episode of recurrent major depressive disorder, without psychotic features ([place]) Long Term Goal(s): Knowledge of disease and therapeutic regimen to maintain health will improve  Short Term Goals: Ability to remain free from injury will improve, Ability to verbalize frustration and anger appropriately will improve, Ability to demonstrate self-control, Ability to participate in decision making will improve, Ability to identify and develop effective coping behaviors will improve, and Compliance with prescribed medications will improve  Medication Management: RN will administer medications as ordered by provider, will assess and evaluate patient's response and provide education to patient for prescribed medication. RN will report any adverse and/or side effects to prescribing provider.  Therapeutic Interventions: 1 on 1 counseling sessions, Psychoeducation, Medication administration, Evaluate responses to treatment, Monitor vital signs and CBGs as ordered, Perform/monitor CIWA, COWS, AIMS and Fall Risk screenings as ordered, Perform wound care treatments as ordered.  Evaluation of Outcomes: Not Met   LCSW Treatment Plan for Primary Diagnosis: Severe episode of recurrent major depressive disorder, without psychotic features ([place]) Long Term Goal(s): Safe  transition to appropriate next level of care at discharge, Engage patient in therapeutic group addressing interpersonal concerns.  Short Term Goals: Engage patient in aftercare planning with referrals and resources, Increase social support, Increase ability to appropriately verbalize feelings, Increase emotional regulation, Identify triggers associated with mental health/substance abuse issues, and Increase skills for wellness and recovery  Therapeutic Interventions: Assess for all discharge needs, 1 to 1 time with Social worker, Explore available  resources and support systems, Assess for adequacy in community support network, Educate family and significant other(s) on suicide prevention, Complete Psychosocial Assessment, Interpersonal group therapy.  Evaluation of Outcomes: Not Met   Progress in Treatment: Attending groups: Yes. Participating in groups: Yes. Taking medication as prescribed: Yes. Toleration medication: Yes. Family/Significant other contact made: Yes, individual(s) contacted:  sister Patient understands diagnosis: Yes. Discussing patient identified problems/goals with staff: Yes. Medical problems stabilized or resolved: Yes. Denies suicidal/homicidal ideation: Yes. Issues/concerns per patient self-inventory: No.   New problem(s) identified: No, Describe:  none  New Short Term/Long Term Goal(s): detox, medication management for mood stabilization; elimination of SI thoughts; development of comprehensive mental wellness/sobriety plan  Patient Goals:  "To have my medicine adjusted"  Discharge Plan or Barriers: Pt is to return to his apartment and has been referred to the [facility] for therapy and medication management.  Reason for Continuation of Hospitalization: Anxiety Depression Medication stabilization Suicidal ideation Withdrawal symptoms  Estimated Length of Stay: 3-5 days  Attendees: Patient: Christopher Giles 04/06/2021   Physician: Fatima Sanger, DO 04/06/2021   Nursing:  04/06/2021   RN Care Manager: 04/06/2021   Social Worker: Darletta Moll, LCSW 04/06/2021   Recreational Therapist:  04/06/2021  Other:  04/06/2021   Other:  04/06/2021   Other: 04/06/2021     Scribe for Treatment Team: Vassie Moselle, LCSW 04/06/2021 2:27 PM

## 2021-04-06 NOTE — Progress Notes (Signed)
[facility] MD Progress Note  04/06/2021 2:19 PM Christopher Giles  MRN:  [id number]  Subjective: "I    Objective: Pt is 55 year old male with past psychiatric history significant for MDD, generalized anxiety disorder, SI with previous suicide attempts. He has a medical history significant hypothyroidism. Patient presented to [facility] ED on 04/02/2021, voluntarily, for worsening depression and suicidal ideation.  Patient was transferred to [facility] UC from ED for assessment and continuous observation where he was restarted on home medications.  Patient transferred to [facility] inpatient unit on 04/03/2021 for crisis stabilization and medication management.   Daily Evaluation: Patient is seen and evaluated, chart reviewed and case discussed with the treatment team. Patient stated he doesn't know how he feels because he just started on new medications. He was started on Prozac, Gabapentin and Seroquel during this admission. Pt states he did not sleep well last night even with taking Seroquel. Record reflects he slept 6.25 hours last night.  Patient stated his appetite is fair and he has been eating all his meals. Patient endorses passive suicidal ideation with no plan or intent. He denies homicidal ideation and, auditory and visual hallucinations. Patient is on room lockout for groups and meals due to his lack of participation in unit activities. He and his roommate get along and have frequent conversations. He did participate in the group activity today. Patient denies any medication side effects. Patient has been hypotensive and is being encouraged to push PO fluids. His BP at lunch today was 108/78, pulse 67, improved from 7/24 with BP 93/70 and pulse 69. Will continue to monitor patient and encourage participation in group and unit activities.   Labs - CBC WNL, CMP WNL, UDS negative, Lipid panel with Cholesterol 151, HDL 37, Triglycerides 273, VLDL 55, Glucose 108, Carbamazepine level < 2.0. Acetaminophen level <10, Salicylate  level <7, A1c 5.4, TSH 4.449, COVID and Influenza A and B negative  Principal Problem: Severe episode of recurrent major depressive disorder, without psychotic features (HCC) Diagnosis: Principal Problem:   Severe episode of recurrent major depressive disorder, without psychotic features (HCC)  Total Time spent with patient:  15 minutes  Past Psychiatric History: MDD, Anxiety, SI and previous suicide attempts, last admitted to [facility]  02/08/21, previous [facility] admissions on 11/17, 10/17.   Past Medical History:  Past Medical History:  Diagnosis Date   Anxiety    Depression    Moderate cocaine use disorder (HCC) 02/09/2021   Substance abuse (HCC)    Suicide attempt (HCC)    x 6 per pt   Thyroid disease    hypo    Past Surgical History:  Procedure Laterality Date   punctured lung     Family History:  Family History  Problem Relation Age of Onset   Hyperlipidemia Mother    Cancer Mother    Cancer Father    Diabetes Maternal Grandmother    Family Psychiatric  History: Mother -depression and anxiety Social History:  Social History   Substance and Sexual Activity  Alcohol Use No     Social History   Substance and Sexual Activity  Drug Use No    Social History   Socioeconomic History   Marital status: Single    Spouse name: Not on file   Number of children: Not on file   Years of education: 12   Highest education level: High school graduate  Occupational History   Not on file  Tobacco Use   Smoking status: Every Day  Packs/day: 1.00    Types: Cigarettes   Smokeless tobacco: Never  Substance and Sexual Activity   Alcohol use: No   Drug use: No   Sexual activity: Never    Birth control/protection: Abstinence  Other Topics Concern   Not on file  Social History Narrative   Not on file   Social Determinants of Health   Financial Resource Strain: Not on file  Food Insecurity: Not on file  Transportation Needs: Not on file  Physical Activity: Not on file   Stress: Not on file  Social Connections: Not on file   Additional Social History:     Sleep: Fair  Appetite:  Fair  Current Medications: Current Facility-Administered Medications  Medication Dose Route Frequency Provider Last Rate Last Admin   acetaminophen (TYLENOL) tablet 650 mg  650 mg Oral Q6H PRN Doda, Vandana, MD       alum & mag hydroxide-simeth (MAALOX/MYLANTA) 200-200-20 MG/5ML suspension 30 mL  30 mL Oral Q4H PRN Doda, Vandana, MD       famotidine (PEPCID) tablet 20 mg  20 mg Oral QHS Doda, Traci Sermon, MD   20 mg at 04/05/21 2143   FLUoxetine (PROZAC) capsule 20 mg  20 mg Oral Daily Doda, Vandana, MD   20 mg at 04/06/21 0900   gabapentin (NEURONTIN) capsule 300 mg  300 mg Oral TID Karsten Ro, MD   300 mg at 04/06/21 1259   hydrOXYzine (ATARAX/VISTARIL) tablet 25 mg  25 mg Oral TID PRN Karsten Ro, MD   25 mg at 04/06/21 0617   levothyroxine (SYNTHROID) tablet 25 mcg  25 mcg Oral Q0600 Karsten Ro, MD   25 mcg at 04/06/21 0616   loperamide (IMODIUM) capsule 2-4 mg  2-4 mg Oral PRN Claudie Revering, MD       LORazepam (ATIVAN) tablet 1 mg  1 mg Oral Q6H PRN Claudie Revering, MD       magnesium hydroxide (MILK OF MAGNESIA) suspension 30 mL  30 mL Oral Daily PRN Karsten Ro, MD       multivitamin with minerals tablet 1 tablet  1 tablet Oral Daily Claudie Revering, MD   1 tablet at 04/06/21 0900   nicotine (NICODERM CQ - dosed in mg/24 hours) patch 21 mg  21 mg Transdermal Daily Doda, Vandana, MD   21 mg at 04/06/21 0900   ondansetron (ZOFRAN-ODT) disintegrating tablet 4 mg  4 mg Oral Q6H PRN Claudie Revering, MD       QUEtiapine (SEROQUEL) tablet 50 mg  50 mg Oral QHS Karsten Ro, MD   50 mg at 04/05/21 2142   thiamine tablet 100 mg  100 mg Oral Daily Claudie Revering, MD   100 mg at 04/06/21 0900    Lab Results:  No results found for this or any previous visit (from the past 48 hour(s)).   Blood Alcohol level:  Lab Results  Component Value Date   ETH <10 04/02/2021    ETH <10 02/24/2021    Metabolic Disorder Labs: Lab Results  Component Value Date   HGBA1C 5.4 04/03/2021   MPG 108 04/03/2021   MPG 114 02/10/2021   No results found for: PROLACTIN Lab Results  Component Value Date   CHOL 151 04/03/2021   TRIG 273 (H) 04/03/2021   HDL 37 (L) 04/03/2021   CHOLHDL 4.1 04/03/2021   VLDL 55 (H) 04/03/2021   LDLCALC 59 04/03/2021   LDLCALC UNABLE TO CALCULATE IF TRIGLYCERIDE OVER 400 mg/dL 93/73/4287  Physical Findings: AIMS:  , ,  ,  ,    CIWA:  CIWA-Ar Total: 4 COWS:     Musculoskeletal: Strength & Muscle Tone: within normal limits Gait & Station: normal Patient leans: N/A  Psychiatric Specialty Exam:  Presentation  General Appearance: Appropriate for Environment; Casual  Eye Contact:Good  Speech:Clear and Coherent; Normal Rate  Speech Volume:Normal  Handedness:Right   Mood and Affect  Mood:Depressed; Anxious  Affect:Congruent; Depressed   Thought Process  Thought Processes:Goal Directed; Coherent  Descriptions of Associations:Intact  Orientation:Full (Time, Place and Person)  Thought Content:Logical  History of Schizophrenia/Schizoaffective disorder:No  Duration of Psychotic Symptoms:No data recorded Hallucinations:Hallucinations: None  Ideas of Reference:None  Suicidal Thoughts:Suicidal Thoughts: Yes, Passive  Homicidal Thoughts:Homicidal Thoughts: No  Sensorium  Memory:Immediate Good; Remote Good; Recent Good  Judgment:Fair  Insight:Fair  Executive Functions  Concentration:Fair  Attention Span:Good  Recall:Good  Fund of Knowledge:Good  Language:Good  Psychomotor Activity  Psychomotor Activity:Psychomotor Activity: Normal  Assets  Assets:Communication Skills; Financial Resources/Insurance; Housing; Physical Health; Desire for Improvement; Social Support  Sleep  Sleep:Sleep: Fair Number of Hours of Sleep: 6.25  Physical Exam: Physical Exam Vitals and nursing note reviewed.   Constitutional:      General: He is not in acute distress.    Appearance: Normal appearance. He is not ill-appearing, toxic-appearing or diaphoretic.  HENT:     Head: Normocephalic and atraumatic.  Pulmonary:     Effort: Pulmonary effort is normal.  Neurological:     General: No focal deficit present.     Mental Status: He is alert and oriented to person, place, and time.  Psychiatric:        Behavior: Behavior normal.   Review of Systems  Constitutional:  Negative for chills and fever.  HENT:  Negative for hearing loss.   Eyes:  Negative for blurred vision.  Respiratory:  Negative for cough.   Cardiovascular:  Negative for chest pain.  Gastrointestinal:  Negative for abdominal pain, nausea and vomiting.  Musculoskeletal:  Negative for myalgias.  Skin:  Negative for rash.  Neurological:  Negative for dizziness and headaches.  Psychiatric/Behavioral:  Positive for depression and suicidal ideas. Negative for hallucinations and memory loss. The patient is nervous/anxious and has insomnia.    Blood pressure 108/78, pulse 67, temperature 97.7 F (36.5 C), temperature source Oral, resp. rate 17, height 6\' 3"  (1.905 m), weight 78.9 kg, SpO2 100 %. Body mass index is 21.75 kg/m.   Treatment Plan Summary: Pt is 55 year old male with past history significant for MDD, generalized anxiety disorder and Hypothyroidism [facility] for worsening depression and suicidal ideation. [facility] on 04/03/2021 for stabilization of symptoms.  Plan-  -Daily contact with patient to assess and evaluate symptoms and progress in treatment -Continue every 15 minute safety checks -Encourage participation in the therapeutic milieu -Discharge planning in progress   MDD (Recurrent, severe) -Continue Prozac 20 mg PO daily -Continue Seroquel 50 mg PO nightly  Generalized Anxiety -Continue Neurontin to 300 mg PO TID.  -Continue  Hydroxyzine 25 mg PO TID PRN   Insomnia -Continue trazodone 50 mg PO QHS PRN    Hypothyrodism -Continue Synthroid 25 mcg PO daily   Withdrawal symptoms.  -CIWA protocol twill discontinue today. Patient last CIWA score was 4 on 7/25 for anxiety. Patient has shown no signs of active withdrawal and has not needed and PRN medications.   Nicotine dependence:  -Nicoderm   Indigestion and  Constipation.  -Continue Milk of Magnesia 30 ml PO PRN Daily for Constipation -Continue Maalox/Mylanta 30 ml PO Q4H PRN for Indigestion -Continue Pepcid 20 mg PO daily at bedtime.   Laveda Abbe, NP 04/06/2021, 2:32 PM

## 2021-04-06 NOTE — Progress Notes (Signed)
D:  Patient denied SI and HI, contracts for safety.  Denied A/V hallucinations.  Denied pain. A:  Medications administered per MD orders.  Emotional support and encouragement given patient. R:  Safety maintained with 15 minute checks.  

## 2021-04-07 MED ORDER — HYDROCORTISONE 1 % EX CREA: TOPICAL_CREAM | Freq: Three times a day (TID) | CUTANEOUS | Status: DC | PRN

## 2021-04-07 MED ORDER — QUETIAPINE FUMARATE 100 MG PO TABS
100.0000 mg | ORAL_TABLET | Freq: Every day | ORAL | Status: DC
  Administered 2021-04-07: 100 mg via ORAL
  Filled 2021-04-07: qty 7
  Filled 2021-04-07 (×2): qty 1

## 2021-04-07 NOTE — Progress Notes (Signed)
Did not attend group 

## 2021-04-07 NOTE — [facility] Group Notes (Signed)
[facility] Group Notes:  (Nursing/MHT/Case Management/Adjunct)  Date:  04/07/2021  Time:  8:13 PM  Type of Therapy:  Group Therapy  Participation Level:  Did Not Attend  Participation Quality:   na  Affect:   na  Cognitive:   na  Insight:  None  Engagement in Group:   na  Modes of Intervention:   na  Summary of Progress/Problems:  Lorita Officer 04/07/2021, 8:13 PM

## 2021-04-07 NOTE — [facility] Group Notes (Signed)
Adult Psychoeducational Group Note  Date:  04/07/2021 Time:  4:30 AM  Group Topic/Focus:  Personal Choices and Values:   The focus of this group is to help patients assess and explore the importance of values in their lives, how their values affect their decisions, how they express their values and what opposes their expression.  Participation Level:  Active  Participation Quality:  Appropriate  Affect:  Appropriate  Cognitive:  Alert  Insight: Good  Engagement in Group:  Engaged  Modes of Intervention:  Discussion  Additional Comments  Jacalyn Lefevre 04/07/2021, 4:30 AM

## 2021-04-07 NOTE — Progress Notes (Signed)
Patient compliant with treatment and medications no adverse effects noted. Denies SI/HIA/VH, verbally contracted for safety.  Christopher Giles has been visible in milieu interacting well with peers and staff. Mood has improved and Pleasant.   Support and encouragement provided as needed. Will continue to monitor.

## 2021-04-07 NOTE — Progress Notes (Signed)
[facility] MD Progress Note  04/07/2021 12:11 PM Christopher Giles  MRN:  [id number]  Subjective: "I    Objective: [facility] with past psychiatric history significant for MDD, generalized anxiety disorder, SI with previous suicide attempts. He has a medical history significant hypothyroidism. [facility], voluntarily, for worsening depression and suicidal ideation.  Patient was transferred to [facility] from ED for assessment and continuous observation where he was restarted on home medications.  [facility] for crisis stabilization and medication management.   Daily Evaluation: Patient is seen and evaluated, chart reviewed and case discussed with the treatment team. Patient stated he slept okay last night, with no nightmares or dreams. Record reflects he slept 6.75 hours, a slight improvement. He stated he continues to have racing thoughts when trying to go to sleep. Will increase Seroquel tonight. He stated he feels okay today but is still depressed and anxious. He is still unsure if the new medications will be helpful since they were just started. He was started on Prozac, Gabapentin and Seroquel during this admission. Patient stated he intends to return to see his outpatient provider, Christopher Giles for medication management. We discussed the risks and benefits of benzodiazepines on older patients who take long-term,  high doses of benzodiazepines. He stated he will decide if he is going to restart benzodiazepines after discharge.Patient stated his appetite is fair. He did not attend group this morning. Patient was reminded he and his roommate are to be out of the room for meals and group due to their lack of participation in unit activities. Nursing and [facility] staff made aware and nursing care order entered for room lock-out for group and meals. Patient  denies suicidal and homicidal ideation. He denies auditory and visual  hallucinations. Patient has been hypotensive but today his BP at 1136 was 104/68, pulse 70. He was reminded to push fluids and stand up slowly. On admission patient stated he has lost 45 pounds in the past 6 months, however his weight has remained stable at 199.6 lbs since 02-08-21. He will be returning to live with his sister after discharge, safety planning has been completed. He received Vistaril PRN x 3 in the past 24 hours. Will continue to monitor patient and encourage participation in group and unit activities.   No new labs or medication changes today.   Principal Problem: Severe episode of recurrent major depressive disorder, without psychotic features (HCC) Diagnosis: Principal Problem:   Severe episode of recurrent major depressive disorder, without psychotic features (HCC)  Total Time spent with patient:  15 minutes  Past Psychiatric History: MDD, Anxiety, SI and previous suicide attempts, [facility]   Past Medical History:  Past Medical History:  Diagnosis Date   Anxiety    Depression    Moderate cocaine use disorder (HCC) 02/09/2021   Substance abuse (HCC)    Suicide attempt (HCC)    x 6 per pt   Thyroid disease    hypo    Past Surgical History:  Procedure Laterality Date   punctured lung     Family History:  Family History  Problem Relation Age of Onset   Hyperlipidemia Mother    Cancer Mother    Cancer Father    Diabetes Maternal Grandmother    Family Psychiatric  History: Mother -depression and anxiety Social History:  Social History   Substance  and Sexual Activity  Alcohol Use No     Social History   Substance and Sexual Activity  Drug Use No    Social History   Socioeconomic History   Marital status: Single    Spouse name: Not on file   Number of children: Not on file   Years of education: 12   Highest education level: High school graduate  Occupational History   Not on file   Tobacco Use   Smoking status: Every Day    Packs/day: 1.00    Types: Cigarettes   Smokeless tobacco: Never  Substance and Sexual Activity   Alcohol use: No   Drug use: No   Sexual activity: Never    Birth control/protection: Abstinence  Other Topics Concern   Not on file  Social History Narrative   Not on file   Social Determinants of Health   Financial Resource Strain: Not on file  Food Insecurity: Not on file  Transportation Needs: Not on file  Physical Activity: Not on file  Stress: Not on file  Social Connections: Not on file   Additional Social History:     Sleep: Fair  Appetite:  Fair  Current Medications: Current Facility-Administered Medications  Medication Dose Route Frequency Provider Last Rate Last Admin   acetaminophen (TYLENOL) tablet 650 mg  650 mg Oral Q6H PRN Doda, Vandana, MD       alum & mag hydroxide-simeth (MAALOX/MYLANTA) 200-200-20 MG/5ML suspension 30 mL  30 mL Oral Q4H PRN Doda, Vandana, MD       famotidine (PEPCID) tablet 20 mg  20 mg Oral QHS Doda, Traci Sermon, MD   20 mg at 04/06/21 2115   FLUoxetine (PROZAC) capsule 20 mg  20 mg Oral Daily Doda, Vandana, MD   20 mg at 04/07/21 0900   gabapentin (NEURONTIN) capsule 300 mg  300 mg Oral TID Karsten Ro, MD   300 mg at 04/07/21 0900   hydrocortisone cream 1 %   Topical TID PRN Ajibola, Ene A, NP   Given at 04/07/21 0737   hydrOXYzine (ATARAX/VISTARIL) tablet 25 mg  25 mg Oral TID PRN Karsten Ro, MD   25 mg at 04/07/21 1062   levothyroxine (SYNTHROID) tablet 25 mcg  25 mcg Oral Q0600 Karsten Ro, MD   25 mcg at 04/07/21 6948   magnesium hydroxide (MILK OF MAGNESIA) suspension 30 mL  30 mL Oral Daily PRN Karsten Ro, MD       multivitamin with minerals tablet 1 tablet  1 tablet Oral Daily Claudie Revering, MD   1 tablet at 04/07/21 0900   nicotine (NICODERM CQ - dosed in mg/24 hours) patch 21 mg  21 mg Transdermal Daily Doda, Vandana, MD   21 mg at 04/07/21 0800   QUEtiapine (SEROQUEL) tablet  50 mg  50 mg Oral QHS Karsten Ro, MD   50 mg at 04/06/21 2115   thiamine tablet 100 mg  100 mg Oral Daily Claudie Revering, MD   100 mg at 04/07/21 0900    Lab Results:  No results found for this or any previous visit (from the past 48 hour(s)).   Blood Alcohol level:  Lab Results  Component Value Date   [facility] <10 04/02/2021   ETH <10 02/24/2021    Metabolic Disorder Labs: Lab Results  Component Value Date   HGBA1C 5.4 04/03/2021   MPG 108 04/03/2021   MPG 114 02/10/2021   No results found for: PROLACTIN Lab Results  Component Value Date   CHOL  151 04/03/2021   TRIG 273 (H) 04/03/2021   HDL 37 (L) 04/03/2021   CHOLHDL 4.1 04/03/2021   VLDL 55 (H) 04/03/2021   LDLCALC 59 04/03/2021   LDLCALC UNABLE TO CALCULATE IF TRIGLYCERIDE OVER 400 mg/dL 78/29/5621    Physical Findings: AIMS:  , ,  ,  ,    CIWA:  CIWA-Ar Total: 6 COWS:     Musculoskeletal: Strength & Muscle Tone: within normal limits Gait & Station: normal Patient leans: N/A  Psychiatric Specialty Exam:  Presentation  General Appearance: Appropriate for Environment; Casual  Eye Contact:Good  Speech:Clear and Coherent; Normal Rate  Speech Volume:Normal  Handedness:Right   Mood and Affect  Mood:Depressed; Anxious  Affect:Congruent; Depressed   Thought Process  Thought Processes:Goal Directed; Coherent  Descriptions of Associations:Intact  Orientation:Full (Time, Place and Person)  Thought Content:Logical  History of Schizophrenia/Schizoaffective disorder:No  Duration of Psychotic Symptoms:No data recorded Hallucinations:No data recorded  Ideas of Reference:None  Suicidal Thoughts:No data recorded  Homicidal Thoughts:No data recorded  Sensorium  Memory:Immediate Good; Remote Good; Recent Good  Judgment:Fair  Insight:Fair  Executive Functions  Concentration:Fair  Attention Span:Good  Recall:Good  Fund of Knowledge:Good  Language:Good  Psychomotor Activity   Psychomotor Activity:No data recorded  Assets  Assets:Communication Skills; Financial Resources/Insurance; Housing; Physical Health; Desire for Improvement; Social Support  Sleep  Sleep:No data recorded  Physical Exam: Physical Exam Vitals and nursing note reviewed.  Constitutional:      General: He is not in acute distress.    Appearance: Normal appearance. He is not ill-appearing, toxic-appearing or diaphoretic.  HENT:     Head: Normocephalic and atraumatic.  Pulmonary:     Effort: Pulmonary effort is normal.  Neurological:     General: No focal deficit present.     Mental Status: He is alert and oriented to person, place, and time.  Psychiatric:        Behavior: Behavior normal.   Review of Systems  Constitutional:  Negative for chills and fever.  HENT:  Negative for hearing loss.   Eyes:  Negative for blurred vision.  Respiratory:  Negative for cough.   Cardiovascular:  Negative for chest pain.  Gastrointestinal:  Negative for abdominal pain, nausea and vomiting.  Musculoskeletal:  Negative for myalgias.  Skin:  Negative for rash.  Neurological:  Negative for dizziness and headaches.  Psychiatric/Behavioral:  Positive for depression and suicidal ideas. Negative for hallucinations and memory loss. The patient is nervous/anxious and has insomnia.    Blood pressure 104/68, pulse 70, temperature 97.7 F (36.5 C), temperature source Oral, resp. rate 18, height 6\' 3"  (1.905 m), weight 78.9 kg, SpO2 96 %. Body mass index is 21.75 kg/m.   Treatment Plan Summary: Pt is 55 year old male with past history significant for MDD, generalized anxiety disorder and Hypothyroidism originally presented to [facility] ED on 04/02/2021 voluntarily for worsening depression and suicidal ideation. Patient admitted to [facility] inpatient unit on 04/03/2021 for stabilization of symptoms.  Plan-  -Daily contact with patient to assess and evaluate symptoms and progress in treatment -Continue every 15  minute safety checks -Encourage participation in the therapeutic milieu -Discharge planning in progress   MDD (Recurrent, severe) -Continue Prozac 20 mg PO daily -Increase Seroquel form 50 mg to 100 mg PO at bedtime for depression and racing thoughts  Generalized Anxiety -Continue Neurontin to 300 mg PO TID.  -Continue Hydroxyzine 25 mg PO TID PRN    Hypothyrodism -Continue Synthroid 25 mcg PO daily   Withdrawal symptoms.  -  CIWA protocol twill discontinue today. Patient last CIWA score was 4 on 7/25 for anxiety. Patient has shown no signs of active withdrawal and has not needed and PRN medications.   Nicotine dependence:  -Nicoderm   Indigestion and Constipation.  -Continue Milk of Magnesia 30 ml PO PRN Daily for Constipation -Continue Maalox/Mylanta 30 ml PO Q4H PRN for Indigestion -Continue Pepcid 20 mg PO daily at bedtime.   Laveda Abbe, NP 04/07/2021, 1:02 PM

## 2021-04-07 NOTE — Progress Notes (Signed)
     04/07/21 0629  Vital Signs  Temp 97.7 F (36.5 C)  Temp Source Oral  Pulse Rate 72  Pulse Rate Source Monitor  Resp 18  BP 97/67  BP Location Right Arm  BP Method Automatic  Patient Position (if appropriate) Sitting  Oxygen Therapy  SpO2 96 %  O2 Device Room Air     04/07/21 0631  Vital Signs  Pulse Rate 94  BP (!) 85/66  BP Location Right Arm  BP Method Automatic  Patient Position (if appropriate) Standing    Pt presented with soft to low BP when standing this AM.  Hydrated with 2 cups of Gatorade.  Provider notified.

## 2021-04-07 NOTE — Progress Notes (Signed)
Recreation Therapy Notes  Animal-Assisted Activity (AAA) Program Checklist/Progress Notes Patient Eligibility Criteria Checklist & Daily Group note for Rec Tx Intervention  Date: 7.26.22 Time: 1430 Location: [address]  AAA/T Program Assumption of Risk Form signed by Engineer, production or Parent Legal Guardian  YES   Patient is free of allergies or severe asthma YES   Patient reports no fear of animals YES  Patient reports no history of cruelty to animals YES  Patient understands his/her participation is voluntary YES  Patient washes hands before animal contact  YES  Patient washes hands after animal contact  YES  Education: Charity fundraiser, Appropriate Animal Interaction   Education Outcome: Acknowledges understanding/In group clarification offered/Needs additional education.   Clinical Observations/Feedback: Pt did not attend group session.    Christopher Giles, LRT/CTRS        Christopher Giles, Christopher Giles 04/07/2021 3:24 PM

## 2021-04-07 NOTE — Plan of Care (Signed)
  Problem: Education: Goal: Mental status will improve Outcome: Not Progressing   Problem: Activity: Goal: Interest or engagement in activities will improve Outcome: Not Progressing Goal: Sleeping patterns will improve Outcome: Not Progressing

## 2021-04-07 NOTE — Progress Notes (Signed)
     04/06/21 2008  Vital Signs  Pulse Rate 81  Resp 18  BP 91/66  BP Location Right Arm  BP Method Automatic  Patient Position (if appropriate) Sitting  Oxygen Therapy  SpO2 98 %  O2 Device Room Air      04/06/21 2009  Vital Signs  Pulse Rate (!) 102  BP (!) 90/57  BP Location Right Arm  BP Method Automatic  Patient Position (if appropriate) Standing    Pt BPs are  low and HR tachy when standing.  Pt reports he was a little dizzy on rising from bed at approximately 2000.  Pt was hydrated with 2 cups of Gatorade.  Pt was further hydrated with 2 cups of water at approximately 2100.  Provider was notified. Per provider, no BP recheck is necessary at this time.

## 2021-04-08 MED ORDER — GABAPENTIN 300 MG PO CAPS: 300.0000 mg | ORAL_CAPSULE | Freq: Three times a day (TID) | ORAL | 0 refills | Status: DC

## 2021-04-08 MED ORDER — QUETIAPINE FUMARATE 100 MG PO TABS: 100.0000 mg | ORAL_TABLET | Freq: Every day | ORAL | 0 refills | Status: DC

## 2021-04-08 MED ORDER — LEVOTHYROXINE SODIUM 25 MCG PO TABS: 25.0000 ug | ORAL_TABLET | Freq: Every day | ORAL | 0 refills | Status: DC

## 2021-04-08 MED ORDER — NICOTINE 21 MG/24HR TD PT24: 21.0000 mg | MEDICATED_PATCH | Freq: Every day | TRANSDERMAL | 0 refills | Status: DC

## 2021-04-08 MED ORDER — FLUOXETINE HCL 20 MG PO CAPS: 20.0000 mg | ORAL_CAPSULE | Freq: Every day | ORAL | 0 refills | Status: DC

## 2021-04-08 NOTE — Progress Notes (Signed)
Recreation Therapy Notes  Date: 7.27.22 Time: 0930 Location: [address]  Group Topic: Stress Management   Goal Area(s) Addresses:  Patient will actively participate in stress management techniques presented during session.  Patient will successfully identify benefit of practicing stress management post d/c.   Intervention: Guided exercise with ambient sound and script  Activity :Guided Imagery  LRT provided education, instruction, and demonstration on practice of visualization via guided imagery. Patients was asked to participate in the technique introduced during session. Patients were given suggestions of ways to access scripts post d/c and encouraged to explore Youtube and other apps available on smartphones, tablets, and computers.   Education:  Stress Management, Discharge Planning.   Education Outcome: Acknowledges education  Clinical Observations/Feedback: Patient did not attend group session.     Caroll Rancher, LRT/CTRS         Caroll Rancher A 04/08/2021 12:18 PM

## 2021-04-08 NOTE — Discharge Summary (Signed)
Physician Discharge Summary Note  Patient:  Christopher Giles is an 55 y.o., male MRN:  [id number] DOB:  [birth year] Patient phone:  [phone] (home)  Patient address:   [address] [place] [address],  Total Time spent with patient: 30 minutes  Date of Admission:  04/03/2021 Date of Discharge: 04/08/2021  Reason for Admission:  (From MD's admission note): Pt is 55 year old male with past history significant for MDD, generalized anxiety disorder and Hypothyroidism originally presented to [facility] ED on 04/02/2021 voluntarily for worsening depression and suicidal ideation.  Patient was transferred to [facility] from ED for assessment and continuous observation where he was restarted on home medications.  Patient transferred to [facility] inpatient unit on 04/03/2021.  Patient is seen and examined today for initial intake and evaluation. Patient states he has been dealing with depression for last 25 years which has been worsening for last 6 months.  Patient endorses active suicidal thoughts with a plan of cutting his wrist. He identify financial problems, not being able to work because of depression, bedbugs at home, gastritis as triggers. He reports past 6-7 suicidal attempts by overdosing.  Her last suicidal attempt was last year .He denies  self-cutting behaviors. He states due to financial problems he had not been able to afford his medication and has been rationing his medication for last few weeks and completely out of all medications medications for last 1 week. He had been taking Abilify 5 mg daily, Zoloft 150 mg daily and Trileptal 150 mg twice daily.            . Currently, he endorses suicidal ideation with a intention and plan of cutting his wrist.  He denies homicidal ideation, and visual and auditory hallucination.  He he states he had crawling sensation on his legs because of trazodone last night. He denies any paranoia.  He endorses depressed mood, poor appetite and sleep,  anhedonia, fatigue, low energy, hopelessness, helplessness, decreased concentration, poor memory, and weight loss of 45 pounds in last 6 months..  He denies worthlessness, and feeling guilty.  He reports racing thoughts, irritability, feeling angry sometimes.  He denies manic type high energy episodes with grandiosity, rapid speech and flight of ideas. He reports generalized anxiety which is out of proportion to the situation.  He reports panic attack about once a week where he feels like getting out of situations.  He denies access to guns. Pt denies any problem with law enforcement and any upcoming court dates.  He reports using Cocaine in the past but he stopped it before his last admission. He denies use of Marijuana, and any other street drugs. Pt denies drinking alcohol. He is single and lives alone in [place]. 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 He quit because of his depression and Gastritis.  Pt is anxious, depressed  cooperative, and alert and oriented x4. His speech is normal with normal/low volume. Pt's mood is depressed with depressed affect. He is not responding to internal stimuli. Endorses SI with a intent and plan by cutting his wrist. Denies HI or AVH.   Principal Problem: Severe episode of recurrent major depressive disorder, without psychotic features [facility]) Discharge Diagnoses: Principal Problem:   Severe episode of recurrent major depressive disorder, without psychotic features [facility])   Past Psychiatric History: See H&P  Past Medical History:  Past Medical History:  Diagnosis Date   Anxiety    Depression    Moderate cocaine use disorder (  HCC) 02/09/2021   Substance abuse (HCC)    Suicide attempt (HCC)    x 6 per pt   Thyroid disease    hypo    Past Surgical History:  Procedure Laterality Date   punctured lung     Family History:  Family History  Problem Relation Age of Onset   Hyperlipidemia Mother    Cancer Mother    Cancer Father     Diabetes Maternal Grandmother    Family Psychiatric  History: See H&P Social History:  Social History   Substance and Sexual Activity  Alcohol Use No     Social History   Substance and Sexual Activity  Drug Use No    Social History   Socioeconomic History   Marital status: Single    Spouse name: Not on file   Number of children: Not on file   Years of education: 12   Highest education level: High school graduate  Occupational History   Not on file  Tobacco Use   Smoking status: Every Day    Packs/day: 1.00    Types: Cigarettes   Smokeless tobacco: Never  Substance and Sexual Activity   Alcohol use: No   Drug use: No   Sexual activity: Never    Birth control/protection: Abstinence  Other Topics Concern   Not on file  Social History Narrative   Not on file   Social Determinants of Health   Financial Resource Strain: Not on file  Food Insecurity: Not on file  Transportation Needs: Not on file  Physical Activity: Not on file  Stress: Not on file  Social Connections: Not on file    Hospital Course:  After the above admission evaluation, Kenechukwu's presenting symptoms were noted. He was recommended for mood stabilization treatments. The medication regimen targeting those presenting symptoms were discussed with him & initiated with his consent. His home medication regimen of Trileptal, Zoloft and Abilify was discontinued due to his complaint that he ran out of his medications one week prior to his admission. He was started on Prozac, Gabapentin and Seroquel. This is Derius's second inpatient hospitalization since May 2022 and he has had 2 admissions to [facility] for observation since May. Patient does not participate in the therapeutic milieu without prompting. He spends most of his time in his room, in bed. He does not appear to be motivated to take interest or control of his mental health needs. His UDS and BAL on arrival to the ED were negative. He was however medicated,  stabilized & discharged on the medications as listed on his discharge medication list below. Besides the mood stabilization treatments, Kadeem was also enrolled in the group counseling sessions being offered & held on this unit. As stated above, his participation in the therapeutic milieu was minimal and often he had to be made to come out of his room and attend group. Once in the group activity he did participate with the group exercises. He worked on Engineer, maintenance (IT). He interacted appropriately with staff and peers, he and his roommate developed a good relationship. He presented no other significant pre-existing medical issues that required treatment. He tolerated his treatment regimen without any adverse effects or reactions reported.   During the course of his hospitalization, the 15-minute checks were adequate to ensure patient's safety. Delan did not display any dangerous, violent or suicidal behavior on the unit.  He interacted with patients & staff appropriately, participated appropriately in the group sessions/therapies. His medications were addressed & adjusted to  meet his needs. He was recommended for outpatient follow-up care & medication management upon discharge to assure continuity of care & mood stability.  At the time of discharge patient is not reporting any acute suicidal/homicidal ideations. He feels more confident about his self-care & in managing his mental health. He currently denies any new issues or concerns. Education and supportive counseling provided throughout his/her hospital stay & upon discharge.   Today upon his discharge evaluation with the attending psychiatrist, Hamza shares he feels he is doing better. He denies any other specific concerns. His sleep has improved since his admission, record shows he slept 6.75 hours last night. His appetite is good. He denies other physical complaints. He denies AH/VH, delusional thoughts or paranoia. He does not appear to be responding  to any internal stimuli. He feels that his medications have been helpful & is in agreement to continue his current treatment regimen as recommended. He was able to engage in safety planning including plan to return to [facility] or contact emergency services if he feels unable to maintain his own safety or the safety of others. Pt had no further questions, comments, or concerns. He left [facility] with all personal belongings in no apparent distress. Transportation home per [employer].    Physical Findings: AIMS: Facial and Oral Movements Muscles of Facial Expression: None, normal Lips and Perioral Area: None, normal Jaw: None, normal Tongue: None, normal,Extremity Movements Upper (arms, wrists, hands, fingers): None, normal Lower (legs, knees, ankles, toes): None, normal, Trunk Movements Neck, shoulders, hips: None, normal, Overall Severity Severity of abnormal movements (highest score from questions above): None, normal Incapacitation due to abnormal movements: None, normal Patient's awareness of abnormal movements (rate only patient's report): No Awareness, Dental Status Current problems with teeth and/or dentures?: No Does patient usually wear dentures?: No  CIWA:  CIWA-Ar Total: 1 COWS:     Musculoskeletal: Strength & Muscle Tone: within normal limits Gait & Station: normal Patient leans: N/A  Psychiatric Specialty Exam:  Presentation  General Appearance: Disheveled  Eye Contact:Good  Speech:Clear and Coherent; Normal Rate  Speech Volume:Normal  Handedness:Right  Mood and Affect  Mood:Depressed  Affect:Congruent; Depressed  Thought Process  Thought Processes:Coherent; Linear  Descriptions of Associations:Intact  Orientation:Full (Time, Place and Person)  Thought Content:Logical  History of Schizophrenia/Schizoaffective disorder:No  Duration of Psychotic Symptoms:No data recorded Hallucinations:No data recorded Ideas of Reference:None  Suicidal Thoughts:No data  recorded Homicidal Thoughts:No data recorded  Sensorium  Memory:Immediate Good; Remote Good; Recent Good  Judgment:Fair  Insight:Fair  Executive Functions  Concentration:Fair  Attention Span:Fair  Recall:Good  Fund of Knowledge:Good  Language:Good  Psychomotor Activity  Psychomotor Activity: No data recorded  Assets  Assets:Communication Skills; Financial Resources/Insurance; Housing; Physical Health; Desire for Improvement; Social Support  Sleep  Sleep: No data recorded  Physical Exam: Physical Exam Vitals and nursing note reviewed.  HENT:     Head: Normocephalic.  Pulmonary:     Effort: Pulmonary effort is normal.  Musculoskeletal:        General: Normal range of motion.     Cervical back: Normal range of motion.  Neurological:     General: No focal deficit present.     Mental Status: He is alert and oriented to person, place, and time.  Psychiatric:        Attention and Perception: Attention normal. He does not perceive auditory or visual hallucinations.        Mood and Affect: Mood is depressed.        Speech:  Speech normal.        Behavior: Behavior normal. Behavior is cooperative.        Thought Content: Thought content normal. Thought content is not paranoid or delusional. Thought content does not include homicidal or suicidal ideation. Thought content does not include homicidal or suicidal plan.        Cognition and Memory: Cognition normal.   Review of Systems  Constitutional: Negative.  Negative for fever.  HENT: Negative.  Negative for congestion, sinus pain and sore throat.   Cardiovascular: Negative.  Negative for chest pain.  Gastrointestinal: Negative.   Genitourinary: Negative.   Musculoskeletal: Negative.   Neurological: Negative.    Blood pressure 95/64, pulse 100, temperature 97.7 F (36.5 C), temperature source Oral, resp. rate 18, height  (1.905 m), weight 78.9 kg, SpO2 97 %. Body mass index is 21.75 kg/m.   Social History    Tobacco Use  Smoking Status Every Day   Packs/day: 1.00   Types: Cigarettes  Smokeless Tobacco Never   Tobacco Cessation:  A prescription for an FDA-approved tobacco cessation medication provided at discharge   Blood Alcohol level:  Lab Results  Component Value Date   [place] <10 04/02/2021   ETH <10 02/24/2021    Metabolic Disorder Labs:  Lab Results  Component Value Date   HGBA1C 5.4 04/03/2021   MPG 108 04/03/2021   MPG 114 02/10/2021   No results found for: PROLACTIN Lab Results  Component Value Date   CHOL 151 04/03/2021   TRIG 273 (H) 04/03/2021   HDL 37 (L) 04/03/2021   CHOLHDL 4.1 04/03/2021   VLDL 55 (H) 04/03/2021   LDLCALC 59 04/03/2021   LDLCALC UNABLE TO CALCULATE IF TRIGLYCERIDE OVER 400 mg/dL 16/06/9603    See Psychiatric Specialty Exam and Suicide Risk Assessment completed by Attending Physician prior to discharge.  Discharge destination:  Home  Is patient on multiple antipsychotic therapies at discharge:  No   Has Patient had three or more failed trials of antipsychotic monotherapy by history:  No  Recommended Plan for Multiple Antipsychotic Therapies: NA  Discharge Instructions     Diet - low sodium heart healthy   Complete by: As directed    Increase activity slowly   Complete by: As directed       Allergies as of 04/08/2021   No Known Allergies      Medication List     STOP taking these medications    ARIPiprazole 5 MG tablet Commonly known as: ABILIFY   hydrOXYzine 25 MG tablet Commonly known as: ATARAX/VISTARIL   OXcarbazepine 150 MG tablet Commonly known as: TRILEPTAL   sertraline 50 MG tablet Commonly known as: ZOLOFT   tamsulosin 0.4 MG Caps capsule Commonly known as: FLOMAX       TAKE these medications      Indication  famotidine 20 MG tablet Commonly known as: PEPCID Take 1 tablet (20 mg total) by mouth at bedtime. For acid reflux  Indication: Gastroesophageal Reflux Disease   FLUoxetine 20 MG  capsule Commonly known as: PROZAC Take 1 capsule (20 mg total) by mouth daily. Start taking on: April 09, 2021  Indication: Major Depressive Disorder   gabapentin 300 MG capsule Commonly known as: NEURONTIN Take 1 capsule (300 mg total) by mouth 3 (three) times daily.  Indication: anxiety   levothyroxine 25 MCG tablet Commonly known as: SYNTHROID Take 1 tablet (25 mcg total) by mouth daily at 6 (six) AM. Start taking on: April 09, 2021 What changed: additional  instructions  Indication: Underactive Thyroid   multivitamin with minerals Tabs tablet Take 1 tablet by mouth daily. (May buy from over the counter): Nutritional supplement  Indication: Nutritional supplement   nicotine 21 mg/24hr patch Commonly known as: NICODERM CQ - dosed in mg/24 hours Place 1 patch (21 mg total) onto the skin daily. Start taking on: April 09, 2021 What changed: additional instructions  Indication: Nicotine Addiction   QUEtiapine 100 MG tablet Commonly known as: SEROQUEL Take 1 tablet (100 mg total) by mouth at bedtime.  Indication: Major Depressive Disorder   silodosin 8 MG Caps capsule Commonly known as: RAPAFLO Take 8 mg by mouth daily with breakfast.  Indication: Benign Enlargement of Prostate        Follow-up Information     [facility].   Specialty: Behavioral Health Why: Please go to this provider for therapy and medication management services during walk in hours:  Monday through Wednesday from 8:00 am to 11:00 am.  Services are provided on a first come, first served basis. First appointments at this facility are walk-in. This facility also offers various group therapy options. Contact information: [address] [place] [phone]                Follow-up recommendations:  Activity:  as tolerated Diet:  Heart Healthy  Comments:  Prescriptions and medication samples were given at discharge.  Patient does not  have insurance but has family who has agreed to help him with obtaining his medications and paying for his outpatient appointments. Patient is agreeable with the discharge plan.  He was given an opportunity to ask questions.  He appears to feel comfortable with discharge and denies any current suicidal or homicidal thoughts.   Patient is instructed prior to discharge to: Take all medications as prescribed by his mental healthcare provider. Report any adverse effects and or reactions from the medicines to his outpatient provider promptly. Patient has been instructed & cautioned: To not engage in alcohol and or illegal drug use while on prescription medicines. In the event of worsening symptoms, patient is instructed to call the crisis hotline, 911 and or go to the nearest ED for appropriate evaluation and treatment of symptoms. To follow-up with his primary care provider for your other medical issues, concerns and or health care needs.   Signed: Laveda AbbeLaurie Britton Dewell Monnier, NP 04/08/2021, 11:37 AM

## 2021-04-08 NOTE — Progress Notes (Signed)
Pt complained of tingling and numbness on his left/leg and right arm that started late last night. Pt waited to report it this morning. NP on sight notified, pt allowed to stay back while others go for breakfast, will continue to monitor.

## 2021-04-08 NOTE — BHH Suicide Risk Assessment (Signed)
[facility] Discharge Suicide Risk Assessment   Principal Problem: Severe episode of recurrent major depressive disorder, without psychotic features (HCC) Discharge Diagnoses: Principal Problem:   Severe episode of recurrent major depressive disorder, without psychotic features (HCC)   Total Time spent with patient: 15 minutes  Musculoskeletal: Strength & Muscle Tone: within normal limits Gait & Station: normal Patient leans: N/A  Psychiatric Specialty Exam  Presentation  General Appearance: Appropriate for Environment; Casual  Eye Contact:Good  Speech:Clear and Coherent; Normal Rate  Speech Volume:Normal  Handedness:Right   Mood and Affect  Mood:Depressed; Anxious  Duration of Depression Symptoms: Greater than two weeks  Affect:Congruent; Depressed   Thought Process  Thought Processes:Goal Directed; Coherent  Descriptions of Associations:Intact  Orientation:Full (Time, Place and Person)  Thought Content:Logical  History of Schizophrenia/Schizoaffective disorder:No  Duration of Psychotic Symptoms:No data recorded Hallucinations:No data recorded Ideas of Reference:None  Suicidal Thoughts:No data recorded Homicidal Thoughts:No data recorded  Sensorium  Memory:Immediate Good; Remote Good; Recent Good  Judgment:Fair  Insight:Fair   Executive Functions  Concentration:Fair  Attention Span:Good  Recall:Good  Fund of Knowledge:Good  Language:Good   Psychomotor Activity  Psychomotor Activity: No data recorded  Assets  Assets:Communication Skills; Financial Resources/Insurance; Housing; Physical Health; Desire for Improvement; Social Support   Sleep  Sleep: No data recorded  Physical Exam: Physical Exam Vitals and nursing note reviewed.  Constitutional:      Appearance: Normal appearance.  HENT:     Head: Normocephalic and atraumatic.  Pulmonary:     Effort: Pulmonary effort is normal.  Neurological:     General: No focal deficit present.      Mental Status: He is alert and oriented to person, place, and time.   Review of Systems  All other systems reviewed and are negative. Blood pressure 95/64, pulse 100, temperature 97.7 F (36.5 C), temperature source Oral, resp. rate 18, height 6\' 3"  (1.905 m), weight 78.9 kg, SpO2 97 %. Body mass index is 21.75 kg/m.  Mental Status Per Nursing Assessment::   On Admission:  Suicidal ideation indicated by patient  Demographic Factors:  Male, Divorced or widowed, Caucasian, Low socioeconomic status, and Unemployed  Loss Factors: Financial problems/change in socioeconomic status  Historical Factors: Prior suicide attempts and Impulsivity  Risk Reduction Factors:   Positive social support and Positive therapeutic relationship  Continued Clinical Symptoms:  Depression:   Impulsivity More than one psychiatric diagnosis Previous Psychiatric Diagnoses and Treatments  Cognitive Features That Contribute To Risk:  Thought constriction (tunnel vision)    Suicide Risk:  Minimal: No identifiable suicidal ideation.  Patients presenting with no risk factors but with morbid ruminations; may be classified as minimal risk based on the severity of the depressive symptoms   Follow-up Information     [facility] Follow up.   Specialty: Behavioral Health Why: Please go to this provider for therapy and medication management services during walk in hours:  Monday through Wednesday from 8:00 am to 11:00 am.  Services are provided on a first come, first served basis. First appointments at this facility are walk-in. This facility also offers various group therapy options. Contact information: [address] [place] [phone]                Plan Of Care/Follow-up recommendations:  Activity:  ad lib  [phone], MD 04/08/2021, 9:32 AM

## 2021-04-08 NOTE — Progress Notes (Signed)
Discharge Note:  D. Pt alert and oriented x 3. Denies SI and HI/A/VH at present and verbally contracted for safety.  Compliant with medications. Pt assessed by MD. Discharged home as ordered.     A.  Emotional Support offered Patient and encouragement provided. All belongings from locker  returned to Patient at the time of discharge. Discharge instructions reveiwed with Patient. Suicide Safety information given and discussed with Patient who stated he understood and had no question.  R.  Pt verbalized understanding related to discharge instructions. Denies concerns at this time ambulatory with steady gait. Appears to be in no physical distress.

## 2021-04-08 NOTE — Progress Notes (Signed)
Patient reported numbness/tingling in left arm/leg and right arm. On evaluation, patient is alert and oriented x 4. Patient reports light tingling in left arm/leg and right arm that started late last night. Patient states he waited until this morning to report.  PERRL, EOM intact. No facial drooping. Speech clear and coherent. Cranial nerves grossly intact. Muscle strength 5/5 in all extremities. Patient ambulatory in room without difficulty. Patient encouraged to report worsening of symptoms to staff immediately.   Vitals:   04/07/21 0631 04/07/21 1136 04/08/21 0618 04/08/21 0620  BP: (!) 85/66 104/68 107/76 95/64  Pulse: 94 70 85 100  Resp:      Temp:   97.7 F (36.5 C)   TempSrc:   Oral   SpO2:   98% 97%  Weight:      Height:

## 2021-04-08 NOTE — BHH Group Notes (Signed)
LCSW Group Therapy Note  04/08/2021   Type of Therapy and Topic:  Group Therapy - Healthy vs Unhealthy Coping Skills  Participation Level:  Active   Description of Group The focus of this group was to determine what unhealthy coping techniques typically are used by group members and what healthy coping techniques would be helpful in coping with various problems. Patients were guided in becoming aware of the differences between healthy and unhealthy coping techniques. Patients were asked to identify 2-3 healthy coping skills they would like to learn to use more effectively, and many mentioned meditation, breathing, and relaxation. These were explained, samples demonstrated, and resources shared for how to learn more at discharge. At group closing, additional ideas of healthy coping skills were shared in a fun exercise.  Therapeutic Goals Patients learned that coping is what human beings do all day long to deal with various situations in their lives Patients defined and discussed healthy vs unhealthy coping techniques Patients identified their preferred coping techniques and identified whether these were healthy or unhealthy Patients determined 2-3 healthy coping skills they would like to become more familiar with and use more often, and practiced a few medications Patients provided support and ideas to each other   Summary of Patient Progress:  Pt shared during introductions his name and stated that drugs are a coping skill. Pt left after introductions.    Therapeutic Modalities Cognitive Behavioral Therapy Motivational Interviewing  Fredirick Lathe, LCSWA Clinicial Social Worker [employer]

## 2021-04-08 NOTE — Progress Notes (Signed)
  [facility] Adult Case Management Discharge Plan :  Will you be returning to the same living situation after discharge:  Yes,  Home  At discharge, do you have transportation home?: Yes,  Safe Transport  Do you have the ability to pay for your medications: Yes,  Family   Release of information consent forms completed and in the chart;  Patient's signature needed at discharge.  Patient to Follow up at:  Follow-up Information     [place] [facility] Follow up.   Specialty: Behavioral Health Why: Please go to this provider for therapy and medication management services during walk in hours:  Monday through Wednesday from 8:00 am to 11:00 am.  Services are provided on a first come, first served basis. First appointments at this facility are walk-in. This facility also offers various group therapy options. Contact information: [phone] [place] [address] [phone]                Next level of care provider has access to [facility] Link:yes  Safety Planning and Suicide Prevention discussed: Yes,  with sister and patient      Has patient been referred to the Quitline?: Patient refused referral  Patient has been referred for addiction treatment: N/A  Aram Beecham, LCSWA 04/08/2021, 10:01 AM

## 2021-04-08 NOTE — Progress Notes (Signed)
   04/08/21 0014  Psych Admission Type (Psych Patients Only)  Admission Status Voluntary  Psychosocial Assessment  Patient Complaints Anxiety  Eye Contact Brief  Facial Expression Flat  Affect Anxious;Fearful  Speech Logical/coherent  Interaction Assertive  Motor Activity Slow  Appearance/Hygiene Improved  Behavior Characteristics Cooperative  Mood Pleasant  Thought Process  Coherency WDL  Content WDL  Delusions None reported or observed  Perception WDL  Hallucination None reported or observed  Judgment Poor  Confusion None  Danger to Self  Current suicidal ideation? Denies  Self-Injurious Behavior No self-injurious ideation or behavior indicators observed or expressed   Agreement Not to Harm Self Yes  Description of Agreement verbally contracted for safety  Danger to Others  Danger to Others None reported or observed

## 2021-06-20 ENCOUNTER — Encounter (HOSPITAL_COMMUNITY): Payer: Self-pay | Admitting: *Deleted

## 2021-06-20 ENCOUNTER — Emergency Department (HOSPITAL_COMMUNITY): Payer: Self-pay

## 2021-06-20 ENCOUNTER — Emergency Department (HOSPITAL_COMMUNITY)
Admission: EM | Admit: 2021-06-20 | Discharge: 2021-06-22 | Disposition: A | Discharge status: Home / Self Care | Payer: Self-pay | Attending: Emergency Medicine | Admitting: Emergency Medicine

## 2021-06-20 ENCOUNTER — Other Ambulatory Visit: Payer: Self-pay

## 2021-06-20 DIAGNOSIS — F17210 Nicotine dependence, cigarettes, uncomplicated: Secondary | ICD-10-CM | POA: Insufficient documentation

## 2021-06-20 DIAGNOSIS — T43592A Poisoning by other antipsychotics and neuroleptics, intentional self-harm, initial encounter: Secondary | ICD-10-CM | POA: Insufficient documentation

## 2021-06-20 DIAGNOSIS — F332 Major depressive disorder, recurrent severe without psychotic features: Secondary | ICD-10-CM | POA: Insufficient documentation

## 2021-06-20 DIAGNOSIS — T1491XA Suicide attempt, initial encounter: Secondary | ICD-10-CM

## 2021-06-20 DIAGNOSIS — R45851 Suicidal ideations: Secondary | ICD-10-CM

## 2021-06-20 DIAGNOSIS — Z20822 Contact with and (suspected) exposure to covid-19: Secondary | ICD-10-CM | POA: Insufficient documentation

## 2021-06-20 DIAGNOSIS — Z79899 Other long term (current) drug therapy: Secondary | ICD-10-CM | POA: Insufficient documentation

## 2021-06-20 DIAGNOSIS — E039 Hypothyroidism, unspecified: Secondary | ICD-10-CM | POA: Insufficient documentation

## 2021-06-20 DIAGNOSIS — Y900 Blood alcohol level of less than 20 mg/100 ml: Secondary | ICD-10-CM | POA: Insufficient documentation

## 2021-06-20 LAB — CBC WITH DIFFERENTIAL/PLATELET
Abs Immature Granulocytes: 0.02 10*3/uL (ref 0.00–0.07)
Basophils Absolute: 0 10*3/uL (ref 0.0–0.1)
Basophils Relative: 1 %
Eosinophils Absolute: 0 10*3/uL (ref 0.0–0.5)
Eosinophils Relative: 0 %
HCT: 41.9 % (ref 39.0–52.0)
Hemoglobin: 13.9 g/dL (ref 13.0–17.0)
Immature Granulocytes: 0 %
Lymphocytes Relative: 14 %
Lymphs Abs: 1 10*3/uL (ref 0.7–4.0)
MCH: 31.3 pg (ref 26.0–34.0)
MCHC: 33.2 g/dL (ref 30.0–36.0)
MCV: 94.4 fL (ref 80.0–100.0)
Monocytes Absolute: 0.5 10*3/uL (ref 0.1–1.0)
Monocytes Relative: 6 %
Neutro Abs: 5.7 10*3/uL (ref 1.7–7.7)
Neutrophils Relative %: 79 %
Platelets: 235 10*3/uL (ref 150–400)
RBC: 4.44 MIL/uL (ref 4.22–5.81)
RDW: 12.3 % (ref 11.5–15.5)
WBC: 7.3 10*3/uL (ref 4.0–10.5)
nRBC: 0 % (ref 0.0–0.2)

## 2021-06-20 LAB — SALICYLATE LEVEL: Salicylate Lvl: <7 mg/dL — ABNORMAL LOW (ref 7.0–30.0)

## 2021-06-20 LAB — COMPREHENSIVE METABOLIC PANEL
ALT: 13 U/L (ref 0–44)
AST: 17 U/L (ref 15–41)
Albumin: 3.7 g/dL (ref 3.5–5.0)
Alkaline Phosphatase: 65 U/L (ref 38–126)
Anion gap: 9 (ref 5–15)
BUN: 13 mg/dL (ref 6–20)
CO2: 27 mmol/L (ref 22–32)
Calcium: 9 mg/dL (ref 8.9–10.3)
Chloride: 102 mmol/L (ref 98–111)
Creatinine, Ser: 0.91 mg/dL (ref 0.61–1.24)
GFR, Estimated: >60 mL/min (ref >60)
Glucose, Bld: 107 mg/dL — ABNORMAL HIGH (ref 70–99)
Potassium: 3.7 mmol/L (ref 3.5–5.1)
Sodium: 138 mmol/L (ref 135–145)
Total Bilirubin: 0.8 mg/dL (ref 0.3–1.2)
Total Protein: 6.4 g/dL — ABNORMAL LOW (ref 6.5–8.1)

## 2021-06-20 LAB — PROTIME-INR
INR: 1.1 (ref 0.8–1.2)
Prothrombin Time: 13.7 seconds (ref 11.4–15.2)

## 2021-06-20 LAB — MAGNESIUM: Magnesium: 2 mg/dL (ref 1.7–2.4)

## 2021-06-20 LAB — RESP PANEL BY RT-PCR (FLU A&B, COVID) ARPGX2
Influenza A by PCR: NEGATIVE
Influenza B by PCR: NEGATIVE
SARS Coronavirus 2 by RT PCR: NEGATIVE

## 2021-06-20 LAB — LACTIC ACID, PLASMA: Lactic Acid, Venous: 1.4 mmol/L (ref 0.5–1.9)

## 2021-06-20 LAB — ETHANOL: Alcohol, Ethyl (B): <10 mg/dL (ref <10)

## 2021-06-20 LAB — ACETAMINOPHEN LEVEL: Acetaminophen (Tylenol), Serum: <10 ug/mL — ABNORMAL LOW (ref 10–30)

## 2021-06-20 MED ORDER — LORAZEPAM 2 MG/ML IJ SOLN
0.5000 mg | Freq: Once | INTRAMUSCULAR | Status: AC
  Administered 2021-06-20: 0.5 mg via INTRAVENOUS
  Filled 2021-06-20: qty 1

## 2021-06-20 MED ORDER — LORAZEPAM 2 MG/ML IJ SOLN
1.0000 mg | Freq: Once | INTRAMUSCULAR | Status: AC
  Administered 2021-06-21: 1 mg via INTRAVENOUS
  Filled 2021-06-20: qty 1

## 2021-06-20 NOTE — ED Notes (Addendum)
[facility] contacted, spoke with "May":  Expect (possible): Lethargy, sz, conduction delay, tachycardia  Recommend: IVF, supportive care Benzos or phenobarbital for sz May give benzo now for restlessness, agitation Check CMP, CBC, APAP, ASA, Mag, UDS EKG, repeat EKG 4 hrs later  Monitor for:  6 hrs after ingestion

## 2021-06-20 NOTE — ED Triage Notes (Addendum)
BIB [facility] from home s/p SI OD with intent of harming self. Reportedly took: 20-seroquel 50mg  at 1430, along with 1 xanax. H/o similar. Admits to alcohol and cocaine last night. Awake, lethargic, slurred speech, coherent. BS 124. HR 80-to 115s. BP 142/64. NSL in place on arrival. 12 lead unremarkable. Called his sister, who called 911. [employer] present on arrival for protocol. C/o twitching, restless, and jittery. Denies pain, NV, HA, visual changes, or syncope. Admits to recent fall. Last ate 1345.

## 2021-06-20 NOTE — ED Notes (Addendum)
[facility] updated, "May"

## 2021-06-20 NOTE — ED Notes (Signed)
IVC paperwork given to [facility] the nurse

## 2021-06-20 NOTE — BH Assessment (Signed)
Comprehensive Clinical Assessment (CCA) Note  06/20/2021 DREVON PLOG [id number]  DISPOSITION:Gave clinical report to Cecilio Asper, NP who determined Pt meets criteria for inpatient psychiatric treatment. Binnie Rail, [facility] at [facility], confirmed adult unit is at capacity. Theda Belfast, AC at [facility], confirmed [facility] is at capacity. Notified Dr. Kristine Royal and Chase Caller, RN, of recommendation via secure message.  The patient demonstrates the following risk factors for suicide: Chronic risk factors for suicide include: psychiatric disorder of major depressive disorder, substance use disorder, and previous suicide attempts by overdose . Acute risk factors for suicide include: loss (financial, interpersonal, professional) and recent discharge from inpatient psychiatry. Protective factors for this patient include: positive social support. Considering these factors, the overall suicide risk at this point appears to be high. Patient is not appropriate for outpatient follow up.  Flowsheet Row ED from 06/20/2021 in [facility] EMERGENCY DEPARTMENT Most recent reading at 06/20/2021  4:41 PM Ad[facility] (Dis[facility]) [facility] Most recent reading at 04/03/2021  4:59 PM ED from 04/03/2021 in [facility] Most recent reading at 04/03/2021  2:46 AM  C-SSRS RISK CATEGORY High Risk High Risk High Risk      Pt is a 55 year old singel male who presents unaccompanied to [facility] ED after ingesting 20 tabs of Seroquel and 1 tab of Xanax in a suicide attempt. Pt is very lethargic and a poor historian. Pt's medical record indicates Pt has a diagnosis of major depressive disorder, generalized  anxiety  disorder, and cocaine use. Pt acknowledges this ingestion was a suicide attempt and that he continues to feel suicidal. He says the Seroquel was not his prescription. He reports using an unknown quantity of alcohol and cocaine  yesterday. Per ED record, Pt called his sister who called 911. Pt has a history of approximately 6-7 previous suicide attempts by overdosing. He reports visual hallucinations which he cannot describe. He denies auditory hallucinations.   Pt cannot identify a precipitant for today's suicide attempt. Pt's medical record indicates Pt has experienced depressive symptoms for the past 25 years. He says he does not have a current psychiatrist or therapist. Pt was inpatient at [facility] 07/22-07/22/2022. He would not give permission to contact any family members or friends. EDP reports IVC hold has been initiated.   Pt is covered by blanket, drowsy, and oriented to person, place and situation. Pt speaks in a slurred tone, at moderate volume and slow pace. Motor behavior appears lethargic. Eye contact is minimal. Pt's mood is depressed and irritable, affect is congruent with mood. Thought process is coherent and relevant. There is no indication Pt is currently responding to internal stimuli or experiencing delusional thought content. Pt was minimally cooperative during assessment.   Chief Complaint:  Chief Complaint  Patient presents with   Ingestion        Suicide Attempt   Visit Diagnosis:  F33.2 Major depressive disorder, Recurrent episode, Severe   CCA Screening, Triage and Referral (STR)  Patient Reported Information How did you hear about [place]? Self  What Is the Reason for Your Visit/Call Today? Pt has a diagnosis of  major depressive disorder and generalized anxiety disorder. He reports ingesting 20 tabs of Seroquel in a suicide attempt. He acknowledges current suicidal ideation.  How Long Has This Been Causing You Problems? 1-6 months  What Do You Feel Would Help You the Most Today? Treatment for Depression or other mood problem; Medication(s)   Have  You Recently Had Any Thoughts About Hurting Yourself? Yes  Are You Planning to Commit Suicide/Harm Yourself At This time? Yes   Have you  Recently Had Thoughts About Hurting Someone Karolee Ohs? No  Are You Planning to Harm Someone at This Time? No  Explanation: No data recorded  Have You Used Any Alcohol or Drugs in the Past 24 Hours? Yes  How Long Ago Did You Use Drugs or Alcohol? No data recorded What Did You Use and How Much? Pt used cocaine and alcohol yesterday   Do You Currently Have a Therapist/Psychiatrist? No  Name of Therapist/Psychiatrist: Refusing to see Dr. Evelene Croon because he stated he does not have any money to pay for the appointment.   Have You Been Recently Discharged From Any Office Practice or Programs? Yes  Explanation of Discharge From Practice/Program: Discharged from [facility] 04/08/2021     CCA Screening Triage Referral Assessment Type of Contact: Tele-Assessment  Telemedicine Service Delivery: Telemedicine service delivery: This service was provided via telemedicine using a 2-way, interactive audio and video technology  Is this Initial or Reassessment? Initial Assessment  Date Telepsych consult ordered in [facility]:  06/20/21  Time Telepsych consult ordered in [facility]:  2227  Location of Assessment: [facility] ED  Provider Location: [facility] Assessment Services   Collateral Involvement: Medical record   Does Patient Have a Court Appointed Legal Guardian? No data recorded Name and Contact of Legal Guardian: No data recorded If Minor and Not Living with Parent(s), Who has Custody? NA  Is CPS involved or ever been involved? Never  Is APS involved or ever been involved? Never   Patient Determined To Be At Risk for Harm To Self or Others Based on Review of Patient Reported Information or Presenting Complaint? Yes, for Self-Harm  Method: No data recorded Availability of Means: No data recorded Intent: No data recorded Notification Required: No data recorded Additional Information for Danger to Others Potential: No data recorded Additional Comments for Danger to Others Potential: No data recorded Are  There Guns or Other Weapons in Your Home? No data recorded Types of Guns/Weapons: No data recorded Are These Weapons Safely Secured?                            No data recorded Who Could Verify You Are Able To Have These Secured: No data recorded Do You Have any Outstanding Charges, Pending Court Dates, Parole/Probation? No data recorded Contacted To Inform of Risk of Harm To Self or Others: Unable to Contact:    Does Patient Present under Involuntary Commitment? Yes  IVC Papers Initial File Date: 06/20/21   [place] of Residence: [place]   Patient Currently Receiving the Following Services: Not Receiving Services   Determination of Need: Emergent (2 hours)   Options For Referral: Inpatient Hospitalization     CCA Biopsychosocial Patient Reported Schizophrenia/Schizoaffective Diagnosis in Past: No   Strengths: dependable, conscious of the things I do   Mental Health Symptoms Depression:   Change in energy/activity; Hopelessness; Increase/decrease in appetite; Sleep (too much or little); Worthlessness   Duration of Depressive symptoms:  Duration of Depressive Symptoms: Greater than two weeks   Mania:   None   Anxiety:    Restlessness; Sleep; Worrying; Fatigue   Psychosis:   Hallucinations   Duration of Psychotic symptoms:  Duration of Psychotic Symptoms: Less than six months   Trauma:   None   Obsessions:   None   Compulsions:  None   Inattention:   None   Hyperactivity/Impulsivity:   None   Oppositional/Defiant Behaviors:   None   Emotional Irregularity:   Potentially harmful impulsivity; Recurrent suicidal behaviors/gestures/threats; Chronic feelings of emptiness   Other Mood/Personality Symptoms:   NA    Mental Status Exam Appearance and self-care  Stature:   Tall   Weight:   Average weight   Clothing:   Disheveled   Grooming:   Neglected   Cosmetic use:   None   Posture/gait:   Slumped   Motor activity:   Slowed    Sensorium  Attention:   Inattentive   Concentration:   Variable   Orientation:   X5   Recall/memory:   Normal   Affect and Mood  Affect:   Depressed   Mood:   Depressed   Relating  Eye contact:   Fleeting   Facial expression:   Depressed   Attitude toward examiner:   Irritable; Uninterested   Thought and Language  Speech flow:  Slurred   Thought content:   Appropriate to Mood and Circumstances   Preoccupation:   None   Hallucinations:   Visual   Organization:  No data recorded  [employer] of Knowledge:   Fair   Intelligence:   Average   Abstraction:   Functional   Judgement:   Impaired   Reality Testing:   Adequate   Insight:   Lacking   Decision Making:   Impulsive   Social Functioning  Social Maturity:   Responsible   Social Judgement:   Normal   Stress  Stressors:   Grief/losses; Housing; Surveyor, quantity; Work   Coping Ability:   Exhausted; Overwhelmed   Skill Deficits:   Decision making; Self-care   Supports:   Support needed     Religion: Religion/Spirituality Are You A Religious Person?: Yes What is Your Religious Affiliation?: Christian How Might This Affect Treatment?: UTA  Leisure/Recreation: Leisure / Recreation Do You Have Hobbies?: No  Exercise/Diet: Exercise/Diet Do You Exercise?: No Have You Gained or Lost A Significant Amount of Weight in the Past Six Months?: Yes-Lost Do You Follow a Special Diet?: No Do You Have Any Trouble Sleeping?: Yes Explanation of Sleeping Difficulties: sleeps about 4 hours per night   CCA Employment/Education Employment/Work Situation: Employment / Work Situation Employment Situation: Employed Work Stressors: Pt is "on leave" and out of work due to medical issues. Patient's Job has Been Impacted by Current Illness: No Has Patient ever Been in the [employer]?: No  Education: Education Is Patient Currently Attending School?: No Last Grade Completed:  12 Did You Attend College?: No Did You Have An Individualized Education Program (IIEP): No Did You Have Any Difficulty At School?: No Patient's Education Has Been Impacted by Current Illness: No   CCA Family/Childhood History Family and Relationship History: Family history Does patient have children?: No  Childhood History:  Childhood History By whom was/is the patient raised?: Both parents Did patient suffer any verbal/emotional/physical/sexual abuse as a child?: No Did patient suffer from severe childhood neglect?: No Has patient ever been sexually abused/assaulted/raped as an adolescent or adult?: No Was the patient ever a victim of a crime or a disaster?: No Witnessed domestic violence?: No Has patient been affected by domestic violence as an adult?: No  Child/Adolescent Assessment:     CCA Substance Use Alcohol/Drug Use: Alcohol / Drug Use Pain Medications: see MAR Prescriptions: see MAR Over the Counter: see MAR History of alcohol / drug use?: Yes Longest period  of sobriety (when/how long): 13 yrs Negative Consequences of Use: Financial, Personal relationships Withdrawal Symptoms: None Substance #1 Name of Substance 1: Cocaine 1 - Age of First Use: unknown 1 - Amount (size/oz): unknown 1 - Frequency: unknown 1 - Duration: unknown 1 - Last Use / Amount: 06/19/2021 1 - Method of Aquiring: unknown 1- Route of Use: unknown Substance #2 Name of Substance 2: Alcohol 2 - Age of First Use: unknown 2 - Amount (size/oz): unknown 2 - Frequency: unknown 2 - Duration: unknown 2 - Last Use / Amount: unknown 2 - Method of Aquiring: unknown 2 - Route of Substance Use: oral                     ASAM's:  Six Dimensions of Multidimensional Assessment  Dimension 1:  Acute Intoxication and/or Withdrawal Potential:   Dimension 1:  Description of individual's past and current experiences of substance use and withdrawal: Patient has no current withdrawal symptoms   Dimension 2:  Biomedical Conditions and Complications:   Dimension 2:  Description of patient's biomedical conditions and  complications: GI issues  Dimension 3:  Emotional, Behavioral, or Cognitive Conditions and Complications:  Dimension 3:  Description of emotional, behavioral, or cognitive conditions and complications: Patient states that he is hopeless and he is suicidal and sees no reason to go on with his life>  Attempted suicide by OD  Dimension 4:  Readiness to Change:  Dimension 4:  Description of Readiness to Change criteria: Patient states that he is ready to make positive changes in his life  Dimension 5:  Relapse, Continued use, or Continued Problem Potential:  Dimension 5:  Relapse, continued use, or continued problem potential critiera description: Patient states that he relapsed after 13 years clean  Dimension 6:  Recovery/Living Environment:  Dimension 6:  Recovery/Iiving environment criteria description: unknown  ASAM Severity Score: ASAM's Severity Rating Score: 9  ASAM Recommended Level of Treatment: ASAM Recommended Level of Treatment: Level III Residential Treatment   Substance use Disorder (SUD) Substance Use Disorder (SUD)  Checklist Symptoms of Substance Use: Continued use despite having a persistent/recurrent physical/psychological problem caused/exacerbated by use, Continued use despite persistent or recurrent social, interpersonal problems, caused or exacerbated by use, Large amounts of time spent to obtain, use or recover from the substance(s), Persistent desire or unsuccessful efforts to cut down or control use, Social, occupational, recreational activities given up or reduced due to use, Substance(s) often taken in larger amounts or over longer times than was intended  Recommendations for Services/Supports/Treatments: Recommendations for Services/Supports/Treatments Recommendations For Services/Supports/Treatments: Inpatient Hospitalization  Discharge Disposition:     DSM5 Diagnoses: Patient Active Problem List   Diagnosis Date Noted   Suicidal overdose (HCC) 02/25/2021   Cocaine use disorder, severe, dependence (HCC) 02/09/2021   MDD (major depressive disorder) 02/08/2021   Major depressive disorder, 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   Moderate episode of recurrent major depressive disorder (HCC)    Severe episode of recurrent major depressive disorder, without psychotic features (HCC)      Referrals to Alternative Service(s): Referred to Alternative Service(s):   Place:   Date:   Time:    Referred to Alternative Service(s):   Place:   Date:   Time:    Referred to Alternative Service(s):   Place:   Date:   Time:    Referred to Alternative Service(s):   Place:   Date:   Time:     Pamalee Leyden, [facility]

## 2021-06-20 NOTE — ED Notes (Signed)
Pt ambulatory to b/r, semi-steady gait, urine not obtained.

## 2021-06-20 NOTE — ED Provider Notes (Signed)
[facility] EMERGENCY DEPARTMENT Provider Note   CSN: [id number] Arrival date & time: 06/20/21  1559     History Chief Complaint  Patient presents with   Ingestion        Suicide Attempt    Christopher Giles is a 55 y.o. male.  55 year old male with prior medical history as detailed below presents for evaluation.  Patient reports suicide attempt.  He reports taking multiple tablets of Seroquel this afternoon around 2 PM.  He did this in attempt to kill himself.  He denies other ingestion.  He does report frequent cocaine use as well.  His last reported cocaine use was yesterday.  In addition to his active SI he reports prior history of suicide attempts in the past.  The history is provided by the patient.  Ingestion This is a new problem. The current episode started 3 to 5 hours ago. The problem occurs rarely. The problem has not changed since onset.Nothing aggravates the symptoms. Nothing relieves the symptoms.      Past Medical History:  Diagnosis Date   Anxiety    Depression    Moderate cocaine use disorder (HCC) 02/09/2021   Substance abuse (HCC)    Suicide attempt (HCC)    x 6 per pt   Thyroid disease    hypo    Patient Active Problem List   Diagnosis Date Noted   Suicidal overdose (HCC) 02/25/2021   Cocaine use disorder, severe, dependence (HCC) 02/09/2021   MDD (major depressive disorder) 02/08/2021   Major depressive disorder, 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   Moderate episode of recurrent major depressive disorder (HCC)    Severe episode of recurrent major depressive disorder, without psychotic features (HCC)     Past Surgical History:  Procedure Laterality Date   punctured lung         Family History  Problem Relation Age of Onset   Hyperlipidemia Mother    Cancer Mother    Cancer Father    Diabetes Maternal Grandmother     Social History   Tobacco Use   Smoking status: Every Day    Packs/day:  1.00    Types: Cigarettes   Smokeless tobacco: Never  Substance Use Topics   Alcohol use: No   Drug use: No    Home Medications Prior to Admission medications   Medication Sig Start Date End Date Taking? Authorizing Provider  famotidine (PEPCID) 20 MG tablet Take 1 tablet (20 mg total) by mouth at bedtime. For acid reflux 02/15/21   Armandina Stammer I, NP  FLUoxetine (PROZAC) 20 MG capsule Take 1 capsule (20 mg total) by mouth daily. 04/09/21   Laveda Abbe, NP  gabapentin (NEURONTIN) 300 MG capsule Take 1 capsule (300 mg total) by mouth 3 (three) times daily. 04/08/21   Laveda Abbe, NP  levothyroxine (SYNTHROID) 25 MCG tablet Take 1 tablet (25 mcg total) by mouth daily at 6 (six) AM. 04/09/21   Laveda Abbe, NP  Multiple Vitamin (MULTIVITAMIN WITH MINERALS) TABS tablet Take 1 tablet by mouth daily. (May buy from over the counter): Nutritional supplement 02/16/21   Armandina Stammer I, NP  nicotine (NICODERM CQ - DOSED IN MG/24 HOURS) 21 mg/24hr patch Place 1 patch (21 mg total) onto the skin daily. 04/09/21   Laveda Abbe, NP  QUEtiapine (SEROQUEL) 100 MG tablet Take 1 tablet (100 mg total) by mouth at bedtime. 04/08/21   Laveda Abbe, NP  silodosin (RAPAFLO) 8 MG CAPS  capsule Take 8 mg by mouth daily with breakfast.    [provider]  clonazePAM (KLONOPIN) 0.5 MG tablet Take 1 tablet (0.5 mg total) by mouth 2 (two) times daily. Patient not taking: Reported on 02/07/2021 07/28/16 02/07/21  Beau Fanny, [name]  pantoprazole (PROTONIX) 20 MG tablet Take 1 tablet (20 mg total) by mouth 2 (two) times daily. Patient not taking: No sig reported 11/11/20 02/07/21  Lorre Nick, MD  sucralfate (CARAFATE) 1 g tablet Take 1 tablet (1 g total) by mouth 4 (four) times daily. Patient not taking: Reported on 02/07/2021 11/11/20 02/07/21  Lorre Nick, MD  terazosin (HYTRIN) 1 MG capsule Take 1 mg by mouth at bedtime. Patient not taking: No sig reported  02/07/21   [provider]  traZODone (DESYREL) 50 MG tablet Take 1 tablet (50 mg total) by mouth at bedtime as needed for sleep. Patient not taking: No sig reported 02/15/21 04/03/21  Sanjuana Kava, NP    Allergies    Patient has no known allergies.  Review of Systems   Review of Systems  All other systems reviewed and are negative.  Physical Exam Updated Vital Signs BP 93/68   Pulse 85   Resp 15   Ht 6\' 3"  (1.905 m)   Wt 79.4 kg   SpO2 91%   BMI 21.87 kg/m   Physical Exam Vitals and nursing note reviewed.  Constitutional:      General: He is not in acute distress.    Appearance: Normal appearance. He is well-developed.  HENT:     Head: Normocephalic and atraumatic.  Eyes:     Conjunctiva/sclera: Conjunctivae normal.     Pupils: Pupils are equal, round, and reactive to light.  Cardiovascular:     Rate and Rhythm: Normal rate and regular rhythm.     Heart sounds: Normal heart sounds.  Pulmonary:     Effort: Pulmonary effort is normal. No respiratory distress.     Breath sounds: Normal breath sounds.  Abdominal:     General: There is no distension.     Palpations: Abdomen is soft.     Tenderness: There is no abdominal tenderness.  Musculoskeletal:        General: No deformity. Normal range of motion.     Cervical back: Normal range of motion and neck supple.  Skin:    General: Skin is warm and dry.  Neurological:     General: No focal deficit present.     Mental Status: He is alert and oriented to person, place, and time.    ED Results / Procedures / Treatments   Labs (all labs ordered are listed, but only abnormal results are displayed) Labs Reviewed  COMPREHENSIVE METABOLIC PANEL - Abnormal; Notable for the following components:      Result Value   Glucose, Bld 107 (*)    Total Protein 6.4 (*)    All other components within normal limits  ACETAMINOPHEN LEVEL - Abnormal; Notable for the following components:   Acetaminophen (Tylenol), Serum <10 (*)    All  other components within normal limits  SALICYLATE LEVEL - Abnormal; Notable for the following components:   Salicylate Lvl <7.0 (*)    All other components within normal limits  RESP PANEL BY RT-PCR (FLU A&B, COVID) ARPGX2  ETHANOL  LACTIC ACID, PLASMA  CBC WITH DIFFERENTIAL/PLATELET  PROTIME-INR  MAGNESIUM  LACTIC ACID, PLASMA  URINALYSIS, ROUTINE W REFLEX MICROSCOPIC  RAPID URINE DRUG SCREEN, HOSP PERFORMED    EKG EKG Interpretation  Date/Time:  Saturday June 20 2021 20:45:54 EDT Ventricular Rate:  87 PR Interval:  133 QRS Duration: 78 QT Interval:  379 QTC Calculation: 456 R Axis:   64 Text Interpretation: Sinus rhythm Right atrial enlargement ST elev, probable normal early repol pattern Confirmed by Kristine Royal [phone]) on 06/20/2021 9:03:27 PM  Radiology DG Chest Port 1 View  Result Date: 06/20/2021 CLINICAL DATA:  Overdose, lethargic, restless EXAM: PORTABLE CHEST 1 VIEW COMPARISON:  None. FINDINGS: The heart size and mediastinal contours are within normal limits. Both lungs are clear. The visualized skeletal structures are unremarkable. IMPRESSION: No active disease. Electronically Signed   By: Elige Ko M.D.   On: 06/20/2021 17:07    Procedures Procedures   Medications Ordered in ED Medications  LORazepam (ATIVAN) injection 1 mg (has no administration in time range)  LORazepam (ATIVAN) injection 0.5 mg (0.5 mg Intravenous Given 06/20/21 1708)    ED Course  I have reviewed the triage vital signs and the nursing notes.  Pertinent labs & imaging results that were available during my care of the patient were reviewed by me and considered in my medical decision making (see chart for details).    MDM Rules/Calculators/A&P                           MDM  MSE complete  Christopher Giles was evaluated in Emergency Department on 06/20/2021 for the symptoms described in the history of present illness. He was evaluated in the context of the global COVID-19  pandemic, which necessitated consideration that the patient might be at risk for infection with the SARS-CoV-2 virus that causes COVID-19. Institutional protocols and algorithms that pertain to the evaluation of patients at risk for COVID-19 are in a state of rapid change based on information released by regulatory bodies including the CDC and federal and state organizations. These policies and algorithms were followed during the patient's care in the ED.  Patient is presenting with attempted suicide by overdose.  He endorses SI.  Patient has been observed in the ED for multiple hours.  No significant effects from his reported ingestion of Seroquel as noted.  Patient is medically clear at this time.  Additional evaluation by psychiatry is expected.  Final disposition is dependent upon psychiatric assessment and evaluation and plan.  IVC hold initiated.    Final Clinical Impression(s) / ED Diagnoses Final diagnoses:  Suicide attempt [facility])  Suicidal ideation    Rx / DC Orders ED Discharge Orders     None        Wynetta Fines, MD 06/20/21 2225

## 2021-06-21 LAB — URINALYSIS, ROUTINE W REFLEX MICROSCOPIC
Bilirubin Urine: NEGATIVE
Glucose, UA: NEGATIVE mg/dL
Hgb urine dipstick: NEGATIVE
Ketones, ur: NEGATIVE mg/dL
Leukocytes,Ua: NEGATIVE
Nitrite: NEGATIVE
Protein, ur: NEGATIVE mg/dL
Specific Gravity, Urine: 1.012 (ref 1.005–1.030)
pH: 7 (ref 5.0–8.0)

## 2021-06-21 LAB — RAPID URINE DRUG SCREEN, HOSP PERFORMED
Amphetamines: NOT DETECTED
Barbiturates: NOT DETECTED
Benzodiazepines: POSITIVE — AB
Cocaine: POSITIVE — AB
Opiates: NOT DETECTED
Tetrahydrocannabinol: POSITIVE — AB

## 2021-06-21 MED ORDER — ACETAMINOPHEN 500 MG PO TABS
1000.0000 mg | ORAL_TABLET | Freq: Once | ORAL | Status: AC
  Administered 2021-06-21: 1000 mg via ORAL
  Filled 2021-06-21: qty 2

## 2021-06-21 MED ORDER — SODIUM CHLORIDE 0.9 % IV BOLUS
500.0000 mL | Freq: Once | INTRAVENOUS | Status: AC
  Administered 2021-06-21: 500 mL via INTRAVENOUS

## 2021-06-21 MED ORDER — POLYETHYLENE GLYCOL 3350 17 G PO PACK
17.0000 g | PACK | Freq: Every day | ORAL | Status: DC
  Administered 2021-06-21 – 2021-06-22 (×2): 17 g via ORAL
  Filled 2021-06-21 (×2): qty 1

## 2021-06-21 MED ORDER — SODIUM CHLORIDE 0.9 % IV SOLN: Freq: Once | INTRAVENOUS | Status: AC

## 2021-06-21 NOTE — ED Notes (Addendum)
Tentatively accepted to OV per Marva. Will go to 2E, to arrive tomorrow (Monday) 10/10 after 10am. Report to be called to [phone].

## 2021-06-21 NOTE — ED Notes (Signed)
Dinner Ordered 

## 2021-06-21 NOTE — Progress Notes (Signed)
Per Asencion Islam, pt has been accepted to [facility] unit. Accepting provider is Raji Thotakua. Patient can arrive 06/22/2021 after 10:00am. Number for report is [phone].   Crissie Reese, MSW, LCSW-A Phone: [phone] Disposition/TOC

## 2021-06-21 NOTE — ED Notes (Signed)
Lunch ordered 

## 2021-06-21 NOTE — ED Notes (Signed)
Awake, alert and talking, taking po's.

## 2021-06-21 NOTE — Progress Notes (Signed)
Per Teodora Medici, patient meets criteria for inpatient treatment. There are no available or appropriate beds at [facility] today. CSW faxed referrals to the following facilities for review:  [facility]  Pending - No Request Sent N/A [address], [place] [address] [phone] --  [facility]  Pending - No Request Sent N/A [address] [place] [address] [phone] --  [facility]  Pending - No Request Sent N/A [address], [place] [address] [phone] --  [facility]  Pending - No Request Sent N/A [address] [place] [address] [phone] --  [facility]  Pending - No Request Sent N/A [address] [place] [address] [phone] --  [facility]  Pending - No Request Sent N/A [address] [place] [address] [phone] --  [facility]  Pending - No Request Sent N/A [address] [place] [address] [phone] --  [facility]  Pending - No Request Sent N/A [address] [place], [place] [address] [phone] --  [facility]  Pending - No Request Sent N/A [address] [place] [address] [phone] --  [facility]  Pending - No Request Sent N/A [address] [place] [address] [phone] --  [facility]  Pending - No Request Sent N/A [address] [place] [address] [phone] --  [facility]  Pending - No Request Sent N/A [address] [place] [address] [phone] --  [facility]  Pending - No Request Sent N/A [address] [place] [address] [phone] --  [name]  Pending - No Request  Sent N/A [address] [place] [address] [phone] --  [facility]  Pending - No Request Sent N/A [address] [place] [address] [phone] --  [facility]  Pending - No Request Sent N/A [address] [place] [address] [phone] --  [facility]  Pending - No Request Sent N/A [address], [place] [address] [phone] --  [facility]  Pending - No Request Sent N/A [address], [place] [address] [phone] --  [facility]  Pending - No Request Sent N/A [address] [place] [address] [phone] --  [facility]  Pending - No Request Sent N/A [address] [place] [address] [phone] --    TTS will continue to seek bed placement.  Crissie Reese, MSW, LCSW-A, LCAS-A Phone: [phone] Disposition/TOC

## 2021-06-21 NOTE — ED Notes (Signed)
Up to b/r, steady gait 

## 2021-06-21 NOTE — ED Notes (Addendum)
This RN familiar with pt. Care assumed at this time. Continues to wait for inpt psych placement per TTS. Pt moved from H/W to Room [facility] area). Denies questions or needs. Alert, NAD, calm, steady gait, speech clear. Admits to some light headedness. States, "feel better, less restless". Pt assumed position of sleep. Lunch delivered. Belongings bagged and labeled. 1 Bag. Locker #3.

## 2021-06-22 NOTE — ED Notes (Signed)
Report called and given to Sadie at [facility]. [facility] called.

## 2021-08-11 ENCOUNTER — Other Ambulatory Visit: Payer: Self-pay

## 2021-08-11 ENCOUNTER — Encounter: Payer: Self-pay | Admitting: Physician Assistant

## 2021-08-11 ENCOUNTER — Ambulatory Visit: Payer: Self-pay | Attending: Physician Assistant | Admitting: Physician Assistant

## 2021-08-11 VITALS — BP 109/71 | HR 84 | Temp 98.5°F | Resp 18 | Ht 75.0 in | Wt 202.0 lb

## 2021-08-11 DIAGNOSIS — F1010 Alcohol abuse, uncomplicated: Secondary | ICD-10-CM

## 2021-08-11 DIAGNOSIS — F332 Major depressive disorder, recurrent severe without psychotic features: Secondary | ICD-10-CM

## 2021-08-11 DIAGNOSIS — E039 Hypothyroidism, unspecified: Secondary | ICD-10-CM

## 2021-08-11 DIAGNOSIS — R351 Nocturia: Secondary | ICD-10-CM

## 2021-08-11 DIAGNOSIS — F1420 Cocaine dependence, uncomplicated: Secondary | ICD-10-CM

## 2021-08-11 DIAGNOSIS — F411 Generalized anxiety disorder: Secondary | ICD-10-CM

## 2021-08-11 DIAGNOSIS — Z6825 Body mass index (BMI) 25.0-25.9, adult: Secondary | ICD-10-CM

## 2021-08-11 DIAGNOSIS — N401 Enlarged prostate with lower urinary tract symptoms: Secondary | ICD-10-CM

## 2021-08-11 MED ORDER — VENLAFAXINE HCL ER 150 MG PO CP24: 150.0000 mg | ORAL_CAPSULE | Freq: Every day | ORAL | 1 refills | Status: AC

## 2021-08-11 MED ORDER — AMANTADINE HCL 100 MG PO CAPS: 100.0000 mg | ORAL_CAPSULE | Freq: Every day | ORAL | 0 refills | Status: DC

## 2021-08-11 MED ORDER — QUETIAPINE FUMARATE 100 MG PO TABS: 100.0000 mg | ORAL_TABLET | Freq: Every day | ORAL | 1 refills | Status: DC

## 2021-08-11 MED ORDER — TAMSULOSIN HCL 0.4 MG PO CAPS: 0.8000 mg | ORAL_CAPSULE | Freq: Every day | ORAL | 1 refills | Status: DC

## 2021-08-11 MED ORDER — LEVOTHYROXINE SODIUM 25 MCG PO TABS: 25.0000 ug | ORAL_TABLET | Freq: Every day | ORAL | 1 refills | Status: DC

## 2021-08-11 MED ORDER — BUSPIRONE HCL 30 MG PO TABS: 30.0000 mg | ORAL_TABLET | Freq: Two times a day (BID) | ORAL | 1 refills | Status: AC

## 2021-08-11 MED ORDER — VENLAFAXINE HCL ER 75 MG PO CP24: 75.0000 mg | ORAL_CAPSULE | Freq: Every day | ORAL | 1 refills | Status: DC

## 2021-08-11 NOTE — Progress Notes (Signed)
Patient has eaten and taken medication today. Patient denies pain at this time.  

## 2021-08-11 NOTE — Progress Notes (Signed)
New Patient Office Visit  Subjective:  Patient ID: Christopher Giles, male    DOB: Dec 09, 1965  Age: 55 y.o. MRN: [id number]  CC:  Chief Complaint  Patient presents with   Medication Management    HPI Christopher Giles states that he is currently residing at [facility] residential treatment center for substance abuse treatment.  Reports that he arrived there Oct 18th, 2022, going to caring services in [place] tomorrow  Reports that he continues to have elevated anxiety worse during the day, states that he has been taking Effexor 150 mg and BuSpar 30 mg twice a day with modest relief.  Has previously failed trazodone, Pristiq, Prozac  States that he has been taking Flomax at bedtime without much relief.  Reports that he was previously treated for BPH with Rapaflo, states that that was successful for him.  Unfortunately he was unable to continue affording the medication.  Reports that he still will wake 3 times a night to use the bathroom.   Reports that he was tested for HIV and hepatitis C while at [facility], self-reports that he is still waiting for his results, does not want to test again today.    Past Medical History:  Diagnosis Date   Anxiety    Depression    Moderate cocaine use disorder ([place]) 02/09/2021   Substance abuse ([place])    Suicide attempt ([place])    x 6 per pt   Thyroid disease    hypo    Past Surgical History:  Procedure Laterality Date   punctured lung      Family History  Problem Relation Age of Onset   Hyperlipidemia Mother    Cancer Mother    Cancer Father    Diabetes Maternal Grandmother     Social History   Socioeconomic History   Marital status: Single    Spouse name: Not on file   Number of children: Not on file   Years of education: 12   Highest education level: High school graduate  Occupational History   Not on file  Tobacco Use   Smoking status: Former    Packs/day: 1.00    Types: Cigarettes   Smokeless tobacco: Never  Substance and  Sexual Activity   Alcohol use: No   Drug use: No   Sexual activity: Never    Birth control/protection: Abstinence  Other Topics Concern   Not on file  Social History Narrative   Not on file   Social Determinants of Health   Financial Resource Strain: Not on file  Food Insecurity: Not on file  Transportation Needs: Not on file  Physical Activity: Not on file  Stress: Not on file  Social Connections: Not on file  Intimate Partner Violence: Not on file    ROS Review of Systems  Constitutional: Negative.   HENT: Negative.    Eyes: Negative.   Respiratory: Negative.    Cardiovascular: Negative.   Gastrointestinal: Negative.   Endocrine: Negative.   Genitourinary:  Positive for frequency. Negative for dysuria, hematuria and penile discharge.  Musculoskeletal: Negative.   Skin: Negative.   Allergic/Immunologic: Negative.   Neurological: Negative.   Hematological: Negative.   Psychiatric/Behavioral:  Negative for dysphoric mood, self-injury, sleep disturbance and suicidal ideas. The patient is nervous/anxious.    Objective:   Today's Vitals: BP 109/71 (BP Location: Left Arm, Patient Position: Sitting, Cuff Size: Normal)   Pulse 84   Temp 98.5 F (36.9 C) (Oral)   Resp 18   Ht 6\' 3"  (  1.905 m)   Wt 202 lb (91.6 kg)   SpO2 97%   BMI 25.25 kg/m   Physical Exam Vitals and nursing note reviewed.  Constitutional:      Appearance: Normal appearance.  HENT:     Head: Normocephalic and atraumatic.     Right Ear: External ear normal.     Left Ear: External ear normal.     Nose: Nose normal.     Mouth/Throat:     Mouth: Mucous membranes are moist.     Pharynx: Oropharynx is clear.  Eyes:     Extraocular Movements: Extraocular movements intact.     Conjunctiva/sclera: Conjunctivae normal.     Pupils: Pupils are equal, round, and reactive to light.  Cardiovascular:     Rate and Rhythm: Normal rate and regular rhythm.     Pulses: Normal pulses.     Heart sounds: Normal  heart sounds.  Pulmonary:     Effort: Pulmonary effort is normal.     Breath sounds: Normal breath sounds.  Musculoskeletal:        General: Normal range of motion.     Cervical back: Normal range of motion and neck supple.  Skin:    General: Skin is warm and dry.  Neurological:     General: No focal deficit present.     Mental Status: He is alert and oriented to person, place, and time.  Psychiatric:        Mood and Affect: Mood normal.        Behavior: Behavior normal.        Thought Content: Thought content normal.        Judgment: Judgment normal.    Assessment & Plan:   Problem List Items Addressed This Visit       Endocrine   Hypothyroidism   Relevant Medications   levothyroxine (SYNTHROID) 25 MCG tablet   Other Relevant Orders   Thyroid Panel With TSH (Completed)     Other   Major depressive disorder, recurrent severe without psychotic features (HCC) - Primary   Relevant Medications   QUEtiapine (SEROQUEL) 100 MG tablet   busPIRone (BUSPAR) 30 MG tablet   venlafaxine XR (EFFEXOR-XR) 150 MG 24 hr capsule   venlafaxine XR (EFFEXOR XR) 75 MG 24 hr capsule   Cocaine use disorder, severe, dependence (HCC)   Relevant Medications   amantadine (SYMMETREL) 100 MG capsule   Alcohol abuse   BPH associated with nocturia   Relevant Medications   tamsulosin (FLOMAX) 0.4 MG CAPS capsule   Other Relevant Orders   PSA (Completed)   GAD (generalized anxiety disorder)   Relevant Medications   busPIRone (BUSPAR) 30 MG tablet   venlafaxine XR (EFFEXOR-XR) 150 MG 24 hr capsule   venlafaxine XR (EFFEXOR XR) 75 MG 24 hr capsule   Other Visit Diagnoses     BMI 25.0-25.9,adult           Outpatient Encounter Medications as of 08/11/2021  Medication Sig   venlafaxine XR (EFFEXOR XR) 75 MG 24 hr capsule Take 1 capsule (75 mg total) by mouth daily with breakfast.   [DISCONTINUED] amantadine (SYMMETREL) 100 MG capsule Take 100 mg by mouth 2 (two) times daily.   [DISCONTINUED]  busPIRone (BUSPAR) 30 MG tablet Take 30 mg by mouth 2 (two) times daily.   [DISCONTINUED] levothyroxine (SYNTHROID) 25 MCG tablet Take 1 tablet (25 mcg total) by mouth daily at 6 (six) AM.   [DISCONTINUED] QUEtiapine (SEROQUEL) 100 MG tablet Take 1 tablet (100 mg  total) by mouth at bedtime.   [DISCONTINUED] tamsulosin (FLOMAX) 0.4 MG CAPS capsule Take 0.4 mg by mouth at bedtime.   [DISCONTINUED] venlafaxine XR (EFFEXOR-XR) 150 MG 24 hr capsule Take 150 mg by mouth daily with breakfast.   amantadine (SYMMETREL) 100 MG capsule Take 1 capsule (100 mg total) by mouth daily. Then d/c   busPIRone (BUSPAR) 30 MG tablet Take 1 tablet (30 mg total) by mouth 2 (two) times daily.   levothyroxine (SYNTHROID) 25 MCG tablet Take 1 tablet (25 mcg total) by mouth daily at 6 (six) AM.   QUEtiapine (SEROQUEL) 100 MG tablet Take 1 tablet (100 mg total) by mouth at bedtime.   tamsulosin (FLOMAX) 0.4 MG CAPS capsule Take 2 capsules (0.8 mg total) by mouth at bedtime.   venlafaxine XR (EFFEXOR-XR) 150 MG 24 hr capsule Take 1 capsule (150 mg total) by mouth daily with breakfast.   [DISCONTINUED] clonazePAM (KLONOPIN) 0.5 MG tablet Take 1 tablet (0.5 mg total) by mouth 2 (two) times daily. (Patient not taking: Reported on 02/07/2021)   [DISCONTINUED] Desvenlafaxine Succinate (PRISTIQ PO) Take 150 mg by mouth daily. (Patient not taking: Reported on 08/11/2021)   [DISCONTINUED] famotidine (PEPCID) 20 MG tablet Take 1 tablet (20 mg total) by mouth at bedtime. For acid reflux (Patient not taking: Reported on 06/21/2021)   [DISCONTINUED] FLUoxetine (PROZAC) 20 MG capsule Take 1 capsule (20 mg total) by mouth daily. (Patient not taking: Reported on 06/21/2021)   [DISCONTINUED] gabapentin (NEURONTIN) 300 MG capsule Take 1 capsule (300 mg total) by mouth 3 (three) times daily. (Patient not taking: Reported on 06/21/2021)   [DISCONTINUED] Multiple Vitamin (MULTIVITAMIN WITH MINERALS) TABS tablet Take 1 tablet by mouth daily. (May buy  from over the counter): Nutritional supplement (Patient not taking: Reported on 06/21/2021)   [DISCONTINUED] nicotine (NICODERM CQ - DOSED IN MG/24 HOURS) 21 mg/24hr patch Place 1 patch (21 mg total) onto the skin daily. (Patient not taking: Reported on 06/21/2021)   [DISCONTINUED] pantoprazole (PROTONIX) 20 MG tablet Take 1 tablet (20 mg total) by mouth 2 (two) times daily. (Patient not taking: No sig reported)   [DISCONTINUED] silodosin (RAPAFLO) 8 MG CAPS capsule Take 8 mg by mouth daily with breakfast. (Patient not taking: No sig reported)   [DISCONTINUED] sucralfate (CARAFATE) 1 g tablet Take 1 tablet (1 g total) by mouth 4 (four) times daily. (Patient not taking: Reported on 02/07/2021)   [DISCONTINUED] terazosin (HYTRIN) 1 MG capsule Take 1 mg by mouth at bedtime. (Patient not taking: No sig reported)   [DISCONTINUED] traZODone (DESYREL) 50 MG tablet Take 1 tablet (50 mg total) by mouth at bedtime as needed for sleep. (Patient not taking: No sig reported)   No facility-administered encounter medications on file as of 08/11/2021.   1. Major depressive disorder, recurrent severe without psychotic features (HCC) Continue current regimen.  Patient encouraged to follow-up with mobile unit as needed.  Patient education given on [facility] financial assistance.  Red flags given for prompt reevaluation. - QUEtiapine (SEROQUEL) 100 MG tablet; Take 1 tablet (100 mg total) by mouth at bedtime.  Dispense: 90 tablet; Refill: 1  2. GAD (generalized anxiety disorder) Increase Effexor 225 mg, continue BuSpar.  Patient strongly encouraged to follow-up with medical provider in the next 2 to 4 weeks.  Patient understands and agrees - busPIRone (BUSPAR) 30 MG tablet; Take 1 tablet (30 mg total) by mouth 2 (two) times daily.  Dispense: 180 tablet; Refill: 1 - venlafaxine XR (EFFEXOR-XR) 150 MG 24 hr capsule; Take  1 capsule (150 mg total) by mouth daily with breakfast.  Dispense: 90 capsule; Refill: 1 - venlafaxine  XR (EFFEXOR XR) 75 MG 24 hr capsule; Take 1 capsule (75 mg total) by mouth daily with breakfast.  Dispense: 90 capsule; Refill: 1  3. Hypothyroidism, unspecified type Continue current regimen - Thyroid Panel With TSH - levothyroxine (SYNTHROID) 25 MCG tablet; Take 1 tablet (25 mcg total) by mouth daily at 6 (six) AM.  Dispense: 90 tablet; Refill: 1  4. BPH associated with nocturia Increase Flomax 0.8 mg  - PSA - tamsulosin (FLOMAX) 0.4 MG CAPS capsule; Take 2 capsules (0.8 mg total) by mouth at bedtime.  Dispense: 90 capsule; Refill: 1  5. Cocaine use disorder, severe, dependence ([place]) Currently being treated for substance abuse.  Taper amantadine - amantadine (SYMMETREL) 100 MG capsule; Take 1 capsule (100 mg total) by mouth daily. Then d/c  Dispense: 14 capsule; Refill: 0  6. Alcohol abuse   7. BMI 25.0-25.9,adult    I have reviewed the patient's medical history (PMH, PSH, Social History, Family History, Medications, and allergies) , and have been updated if relevant. I spent 30 minutes reviewing chart and  face to face time with patient.    Follow-up: Return if symptoms worsen or fail to improve.    Loraine Grip Mayers, PA-C

## 2021-08-11 NOTE — Patient Instructions (Addendum)
You are going to taper the amantadine, you will take 100 mg once a day for 2 weeks and then you will discontinue.  To help with your anxiety, you will increase Effexor to 225 mg once daily.  To help with your BPH, you will increase Flomax 0.8 mg  The name of the 2 tests that I spoke about are GeneSight and Geno mind.  Please make sure that you follow-up with a medical provider in the next 2 to 4 weeks to review your medication changes.  We will call you with today's lab results, please make sure that we have a good working number on file.  Roney Jaffe, PA-C Physician Assistant [facility] [email or web address]   Benign Prostatic Hyperplasia Benign prostatic hyperplasia (BPH) is an enlarged prostate gland that is caused by the normal aging process. The prostate may get bigger as a man gets older. The condition is not caused by cancer. The prostate is a walnut-sized gland that is involved in the production of semen. It is located in front of the rectum and below the bladder. The bladder stores urine. The urethra carries stored urine out of the body. An enlarged prostate can press on the urethra. This can make it harder to pass urine. The buildup of urine in the bladder can cause infection. Back pressure and infection may progress to bladder damage and kidney (renal) failure. What are the causes? This condition is part of the normal aging process. However, not all men develop problems from this condition. If the prostate enlarges away from the urethra, urine flow will not be blocked. If it enlarges toward the urethra and compresses it, there will be problems passing urine. What increases the risk? This condition is more likely to develop in men older than 50 years. What are the signs or symptoms? Symptoms of this condition include: Getting up often during the night to urinate. Needing to urinate frequently during the day. Difficulty  starting urine flow. Decrease in size and strength of your urine stream. Leaking (dribbling) after urinating. Inability to pass urine. This needs immediate treatment. Inability to completely empty your bladder. Pain when you pass urine. This is more common if there is also an infection. Urinary tract infection (UTI). How is this diagnosed? This condition is diagnosed based on your medical history, a physical exam, and your symptoms. Tests will also be done, such as: A post-void bladder scan. This measures any amount of urine that may remain in your bladder after you finish urinating. A digital rectal exam. In a rectal exam, your health care provider checks your prostate by putting a lubricated, gloved finger into your rectum to feel the back of your prostate gland. This exam detects the size of your gland and any abnormal lumps or growths. An exam of your urine (urinalysis). A prostate specific antigen (PSA) screening. This is a blood test used to screen for prostate cancer. An ultrasound. This test uses sound waves to electronically produce a picture of your prostate gland. Your health care provider may refer you to a specialist in kidney and prostate diseases (urologist). How is this treated? Once symptoms begin, your health care provider will monitor your condition (active surveillance or watchful waiting). Treatment for this condition will depend on the severity of your condition. Treatment may include: Observation and yearly exams. This may be the only treatment needed if your condition and symptoms are mild. Medicines to relieve your symptoms, including: Medicines to shrink the prostate. Medicines to relax the muscle  of the prostate. Surgery in severe cases. Surgery may include: Prostatectomy. In this procedure, the prostate tissue is removed completely through an open incision or with a laparoscope or robotics. Transurethral resection of the prostate (TURP). In this procedure, a tool is  inserted through the opening at the tip of the penis (urethra). It is used to cut away tissue of the inner core of the prostate. The pieces are removed through the same opening of the penis. This removes the blockage. Transurethral incision (TUIP). In this procedure, small cuts are made in the prostate. This lessens the prostate's pressure on the urethra. Transurethral microwave thermotherapy (TUMT). This procedure uses microwaves to create heat. The heat destroys and removes a small amount of prostate tissue. Transurethral needle ablation (TUNA). This procedure uses radio frequencies to destroy and remove a small amount of prostate tissue. Interstitial laser coagulation (ILC). This procedure uses a laser to destroy and remove a small amount of prostate tissue. Transurethral electrovaporization (TUVP). This procedure uses electrodes to destroy and remove a small amount of prostate tissue. Prostatic urethral lift. This procedure inserts an implant to push the lobes of the prostate away from the urethra. Follow these instructions at home: Take over-the-counter and prescription medicines only as told by your health care provider. Monitor your symptoms for any changes. Contact your health care provider with any changes. Avoid drinking large amounts of liquid before going to bed or out in public. Avoid or reduce how much caffeine or alcohol you drink. Give yourself time when you urinate. Keep all follow-up visits. This is important. Contact a health care provider if: You have unexplained back pain. Your symptoms do not get better with treatment. You develop side effects from the medicine you are taking. Your urine becomes very dark or has a bad smell. Your lower abdomen becomes distended and you have trouble passing urine. Get help right away if: You have a fever or chills. You suddenly cannot urinate. You feel light-headed or very dizzy, or you faint. There are large amounts of blood or clots in  your urine. Your urinary problems become hard to manage. You develop moderate to severe low back or flank pain. The flank is the side of your body between the ribs and the hip. These symptoms may be an emergency. Get help right away. Call 911. Do not wait to see if the symptoms will go away. Do not drive yourself to the hospital. Summary Benign prostatic hyperplasia (BPH) is an enlarged prostate that is caused by the normal aging process. It is not caused by cancer. An enlarged prostate can press on the urethra. This can make it hard to pass urine. This condition is more likely to develop in men older than 50 years. Get help right away if you suddenly cannot urinate. This information is not intended to replace advice given to you by your health care provider. Make sure you discuss any questions you have with your health care provider. Document Revised: 03/18/2021 Document Reviewed: 03/18/2021 Elsevier Patient Education  2022 [employer].

## 2021-08-12 DIAGNOSIS — F411 Generalized anxiety disorder: Secondary | ICD-10-CM | POA: Insufficient documentation

## 2021-08-12 DIAGNOSIS — F1010 Alcohol abuse, uncomplicated: Secondary | ICD-10-CM | POA: Insufficient documentation

## 2021-08-12 DIAGNOSIS — E039 Hypothyroidism, unspecified: Secondary | ICD-10-CM | POA: Insufficient documentation

## 2021-08-12 DIAGNOSIS — Z6825 Body mass index (BMI) 25.0-25.9, adult: Secondary | ICD-10-CM | POA: Insufficient documentation

## 2021-08-12 DIAGNOSIS — R351 Nocturia: Secondary | ICD-10-CM | POA: Insufficient documentation

## 2021-08-12 DIAGNOSIS — N401 Enlarged prostate with lower urinary tract symptoms: Secondary | ICD-10-CM | POA: Insufficient documentation

## 2021-08-12 LAB — THYROID PANEL WITH TSH
Free Thyroxine Index: 2 (ref 1.2–4.9)
T3 Uptake Ratio: 27 % (ref 24–39)
T4, Total: 7.3 ug/dL (ref 4.5–12.0)
TSH: 2.69 u[IU]/mL (ref 0.450–4.500)

## 2021-08-12 LAB — PSA: Prostate Specific Ag, Serum: 0.9 ng/mL (ref 0.0–4.0)

## 2021-08-18 NOTE — Progress Notes (Signed)
NO contact information listed for patient. Patient is no longer in the care of [facility].

## 2021-08-19 ENCOUNTER — Telehealth: Payer: Self-pay | Admitting: *Deleted

## 2021-08-19 NOTE — Telephone Encounter (Signed)
Dr. Sarina Ser with [facility] is with the patient at this time requesting recent lab result. Provided results.

## 2022-01-20 IMAGING — DX DG CHEST 1V PORT
1 series · 1 of 1 positions shown · non-contrast
Comparison: None.

CLINICAL DATA: Overdose, lethargic, restless

EXAM:
PORTABLE CHEST 1 VIEW

[chest ap]
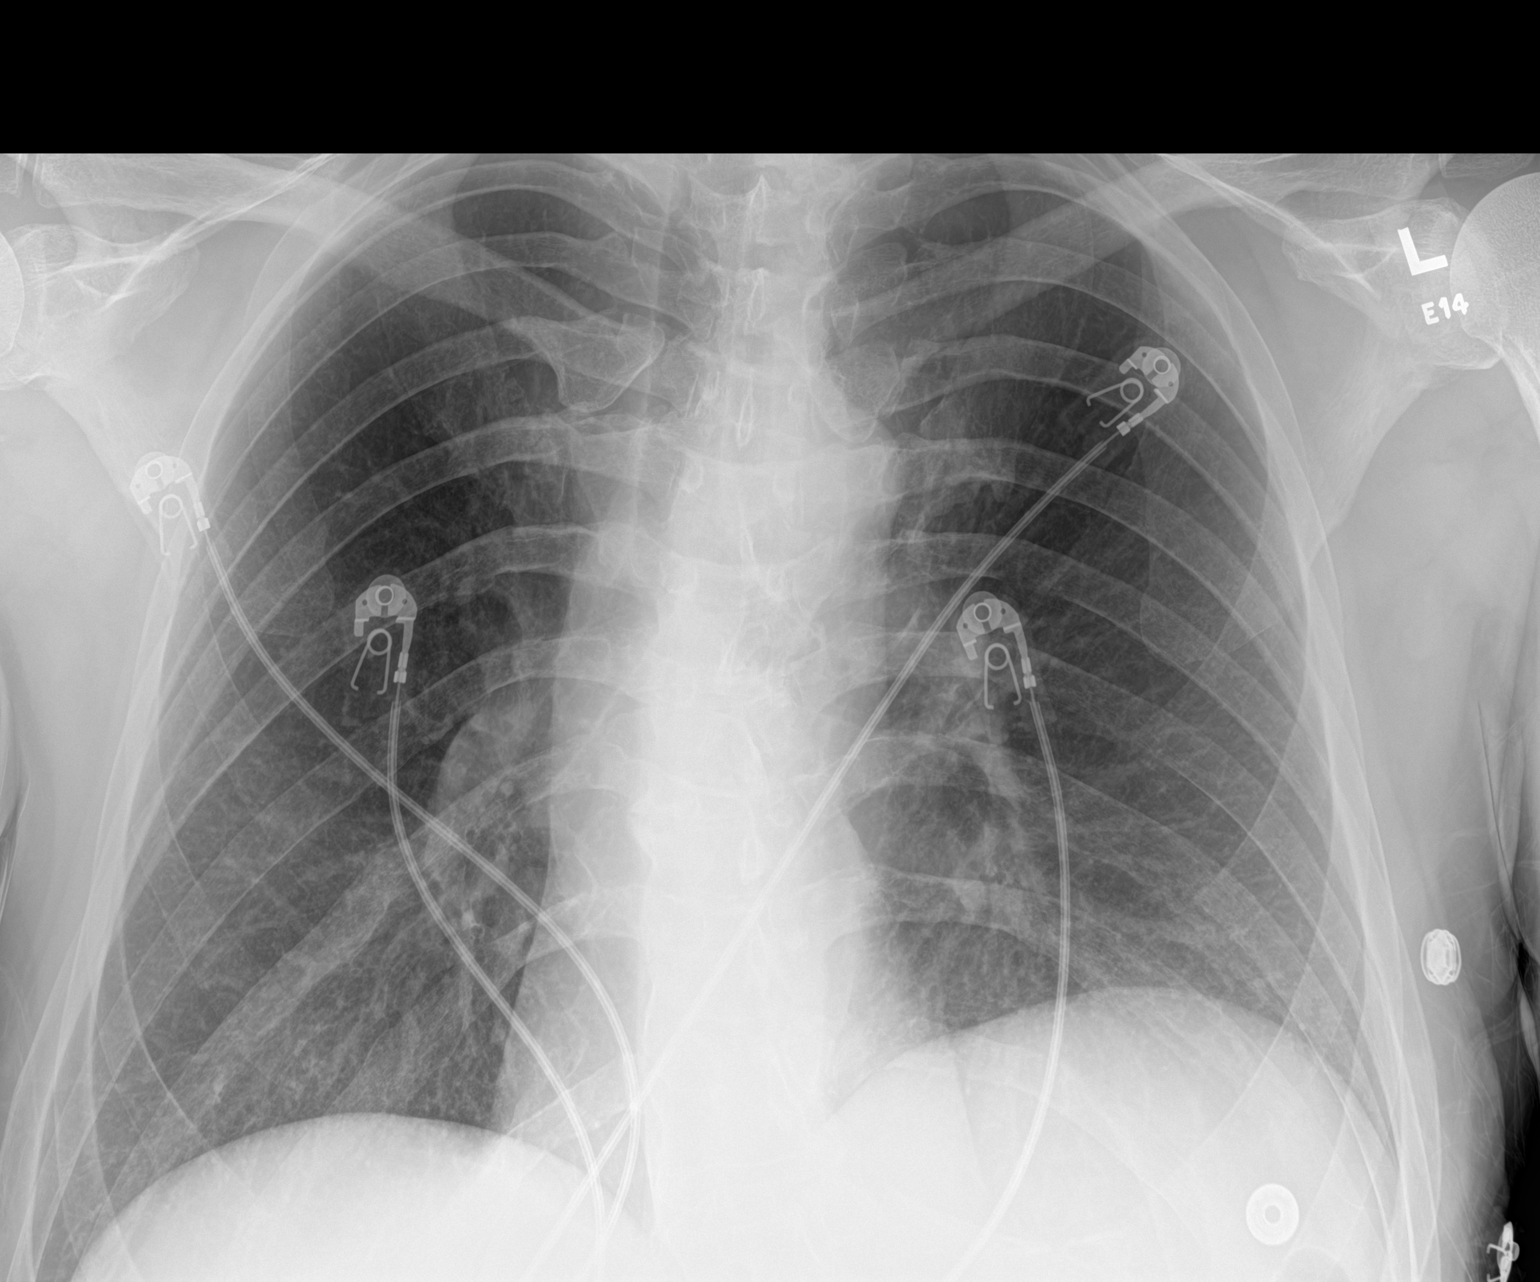

[1 of 1 positions shown; findings below may reference images not displayed]

FINDINGS: The heart size and mediastinal contours are within normal limits.
Both lungs are clear. The visualized skeletal structures are
unremarkable.
IMPRESSION: No active disease.

## 2023-12-28 ENCOUNTER — Ambulatory Visit (HOSPITAL_COMMUNITY)
Admission: EM | Admit: 2023-12-28 | Discharge: 2023-12-28 | Disposition: A | Discharge status: Home / Self Care | Attending: Psychiatry | Admitting: Psychiatry

## 2023-12-28 DIAGNOSIS — Z760 Encounter for issue of repeat prescription: Secondary | ICD-10-CM | POA: Insufficient documentation

## 2023-12-28 DIAGNOSIS — Z91148 Patient's other noncompliance with medication regimen for other reason: Secondary | ICD-10-CM | POA: Insufficient documentation

## 2023-12-28 NOTE — Progress Notes (Signed)
   12/28/23 1841  [facility] Triage Screening (Walk-ins at [facility] only)  What Is the Reason for Your Visit/Call Today? Pt presents to [facility] voluntarily accommpanied by girlfriend. Pt states that he has been off of his medication for 2 months and needs to get back on, her has been anxious since he was off of it. Pt is preparing to go to [facility] but needs his medication in order to be accepted. Pt denies SI, HI, AVH, Abuse, Alcohol/Drug use.  How Long Has This Been Causing You Problems? 1-6 months  Have You Recently Had Any Thoughts About Hurting Yourself? No  Are You Planning to Commit Suicide/Harm Yourself At This time? No  Have you Recently Had Thoughts About Hurting Someone Marigene Shoulder? No  Are You Planning To Harm Someone At This Time? No  Physical Abuse Denies  Verbal Abuse Denies  Sexual Abuse Denies  Exploitation of patient/patient's resources Denies  Self-Neglect Denies  Are you currently experiencing any auditory, visual or other hallucinations? No  Have You Used Any Alcohol or Drugs in the Past 24 Hours? No  Do you have any current medical co-morbidities that require immediate attention? Yes  Please describe current medical co-morbidities that require immediate attention: thyriod and enlarged prostate  Clinician description of patient physical appearance/behavior: Pt is calm and cooperative  What Do You Feel Would Help You the Most Today? Medication(s)  Determination of Need Routine (7 days)  Options For Referral Medication Management

## 2023-12-28 NOTE — ED Provider Notes (Signed)
 [facility] Medical Screening Exam  Patient Name: Christopher Giles MRN: [id number] Date of Evaluation: 12/29/23 Chief Complaint:  want to start back his medications Diagnosis:  Final diagnoses:  Encounter for medication refill  H/O medication noncompliance    History of Present illness: Christopher Giles is a 58 y.o. male. With a history of PTSD, alcohol abuse, polysubstance abuse, SI.  Presented to [facility] voluntarily.  Per the patient he wants to get back on his medications.  When asked what medicines hardly see talking about patient stated Synthroid , blood pressure medicine.  According to the patient he is trying to get into [place] but they told him he needed to get back on his medicine or at least get his prescription for it.  When asked if he has a primary care physician patient stated yes but he cannot get an appointment for the next 2 weeks.  Writer discussed with patient that he needs to reach out to his PCP to refill his medications because these are primary care medicines.  When asked when the last time he took his medicine patient stated a couple months ago.  Patient also stated that he was using drugs as why he stopped taking his medicines when asked if he is still using drugs patient stated no he stopped a couple weeks ago.  Patient lives with his partner, denied wanting to hurt himself or others denies access to guns.  Face-to-face evaluation of patient, patient is alert and oriented x 4, speech is clear, maintained minimal eye contact.  Patient denies SI, HI, AVH or paranoia.  Denied wanting to hurt himself or others.  Does not appear to be in any distress.  Patient reports he used to drink alcohol but stated he last drink alcohol a couple weeks ago he last used cocaine a couple weeks ago and he last used marijuana last week.  Patient does not appear to be influenced by internal or external stimuli at this time.  Writer discussed with patient that he needs to reach out to  his primary care physician to restart his medications such as his Synthroid , blood pressure medicine whenever medicine he is taking.  Writer also gave patient information for walk-in psychiatry should he need to come in and see if they will start him on his medicine however we would not be able to do that tonight.  At the end of this evaluation patient was both medically and psychologically cleared for discharge does not appear to be a threat to himself or others does not appear to be in any distress.  Recommend discharge for patient to follow up with PCP or walk-in psychiatry.  Flowsheet Row ED from 12/28/2023 in [facility] ED from 06/20/2021 in [facility] Emergency Department at [facility] Admission (Discharged) [facility]  C-SSRS RISK CATEGORY No Risk No Risk High Risk       Psychiatric Specialty Exam  Presentation  General Appearance:Casual  Eye Contact:Fair  Speech:Clear and Coherent  Speech Volume:Normal  Handedness:Right   Mood and Affect  Mood: Anxious  Affect: Congruent   Thought Process  Thought Processes: Coherent  Descriptions of Associations:Intact  Orientation:Full (Time, Place and Person)  Thought Content:Logical  Diagnosis of Schizophrenia or Schizoaffective disorder in past: No data recorded Duration of Psychotic Symptoms: No data recorded Hallucinations:None  Ideas of Reference:None  Suicidal Thoughts:No  Homicidal Thoughts:No   Sensorium  Memory: Immediate Good  Judgment: Fair  Insight: Fair  Executive Functions  Concentration: Fair  Attention Span: Good  Recall: Good  Fund of Knowledge: Good  Language: Good   Psychomotor Activity  Psychomotor Activity: Normal   Assets  Assets: Communication Skills   Sleep  Sleep: Fair  Number of hours:  6   Physical Exam: Physical Exam HENT:     Head: Normocephalic.      Nose: Nose normal.  Eyes:     Pupils: Pupils are equal, round, and reactive to light.  Cardiovascular:     Rate and Rhythm: Normal rate.  Pulmonary:     Effort: Pulmonary effort is normal.  Musculoskeletal:        General: Normal range of motion.     Cervical back: Normal range of motion.  Neurological:     General: No focal deficit present.     Mental Status: He is alert.  Psychiatric:        Mood and Affect: Mood normal.    Review of Systems  Constitutional: Negative.   HENT: Negative.    Eyes: Negative.   Respiratory: Negative.    Cardiovascular: Negative.   Gastrointestinal: Negative.   Genitourinary: Negative.   Musculoskeletal: Negative.   Skin: Negative.   Neurological: Negative.   Psychiatric/Behavioral:  The patient is nervous/anxious.    Blood pressure 99/70, pulse 76, temperature 97.9 F (36.6 C), temperature source Oral, resp. rate 17, SpO2 98%. There is no height or weight on file to calculate BMI.  Musculoskeletal: Strength & Muscle Tone: within normal limits Gait & Station: normal Patient leans: N/A   [facility] MSE Discharge Disposition for Follow up and Recommendations: Based on my evaluation the patient does not appear to have an emergency medical condition and can be discharged with resources and follow up care in outpatient services for Medication Management   Dorthea Gauze, NP 12/29/2023, 5:26 AM

## 2023-12-28 NOTE — Discharge Instructions (Signed)
 F/u with PCP for medication management F/u with walk-in clinic

## 2023-12-31 ENCOUNTER — Other Ambulatory Visit: Payer: Self-pay

## 2023-12-31 ENCOUNTER — Emergency Department (HOSPITAL_COMMUNITY)
Admission: EM | Admit: 2023-12-31 | Discharge: 2023-12-31 | Disposition: A | Discharge status: Home / Self Care | Payer: Self-pay | Attending: Emergency Medicine | Admitting: Emergency Medicine

## 2023-12-31 DIAGNOSIS — Z79899 Other long term (current) drug therapy: Secondary | ICD-10-CM | POA: Insufficient documentation

## 2023-12-31 DIAGNOSIS — N401 Enlarged prostate with lower urinary tract symptoms: Secondary | ICD-10-CM

## 2023-12-31 DIAGNOSIS — E039 Hypothyroidism, unspecified: Secondary | ICD-10-CM | POA: Insufficient documentation

## 2023-12-31 DIAGNOSIS — Z760 Encounter for issue of repeat prescription: Secondary | ICD-10-CM | POA: Insufficient documentation

## 2023-12-31 LAB — CBC WITH DIFFERENTIAL/PLATELET
Abs Immature Granulocytes: 0.04 10*3/uL (ref 0.00–0.07)
Basophils Absolute: 0.1 10*3/uL (ref 0.0–0.1)
Basophils Relative: 1 %
Eosinophils Absolute: 0.1 10*3/uL (ref 0.0–0.5)
Eosinophils Relative: 2 %
HCT: 46.5 % (ref 39.0–52.0)
Hemoglobin: 15 g/dL (ref 13.0–17.0)
Immature Granulocytes: 1 %
Lymphocytes Relative: 20 %
Lymphs Abs: 1.6 10*3/uL (ref 0.7–4.0)
MCH: 30.2 pg (ref 26.0–34.0)
MCHC: 32.3 g/dL (ref 30.0–36.0)
MCV: 93.6 fL (ref 80.0–100.0)
Monocytes Absolute: 0.6 10*3/uL (ref 0.1–1.0)
Monocytes Relative: 7 %
Neutro Abs: 5.7 10*3/uL (ref 1.7–7.7)
Neutrophils Relative %: 69 %
Platelets: 384 10*3/uL (ref 150–400)
RBC: 4.97 MIL/uL (ref 4.22–5.81)
RDW: 11.9 % (ref 11.5–15.5)
WBC: 8.1 10*3/uL (ref 4.0–10.5)
nRBC: 0 % (ref 0.0–0.2)

## 2023-12-31 LAB — T4, FREE: Free T4: 0.86 ng/dL (ref 0.61–1.12)

## 2023-12-31 LAB — BASIC METABOLIC PANEL WITH GFR
Anion gap: 10 (ref 5–15)
BUN: 10 mg/dL (ref 6–20)
CO2: 24 mmol/L (ref 22–32)
Calcium: 9.3 mg/dL (ref 8.9–10.3)
Chloride: 103 mmol/L (ref 98–111)
Creatinine, Ser: 0.73 mg/dL (ref 0.61–1.24)
GFR, Estimated: >60 mL/min (ref >60)
Glucose, Bld: 96 mg/dL (ref 70–99)
Potassium: 4.2 mmol/L (ref 3.5–5.1)
Sodium: 137 mmol/L (ref 135–145)

## 2023-12-31 LAB — TSH: TSH: 2.236 u[IU]/mL (ref 0.350–4.500)

## 2023-12-31 MED ORDER — LEVOTHYROXINE SODIUM 25 MCG PO TABS: 25.0000 ug | ORAL_TABLET | Freq: Every day | ORAL | 0 refills | Status: DC

## 2023-12-31 MED ORDER — TAMSULOSIN HCL 0.4 MG PO CAPS: 0.8000 mg | ORAL_CAPSULE | Freq: Every day | ORAL | 0 refills | Status: DC

## 2023-12-31 MED ORDER — TAMSULOSIN HCL 0.4 MG PO CAPS: 0.8000 mg | ORAL_CAPSULE | Freq: Every day | ORAL | 0 refills | Status: AC

## 2023-12-31 NOTE — ED Provider Notes (Signed)
 [place] EMERGENCY DEPARTMENT AT [facility] Provider Note   CSN: [id number] Arrival date & time: 12/31/23  1055     History  Chief Complaint  Patient presents with   Medication Refill    Christopher Giles is a 58 y.o. male with PMHx polysubstance use disorder, anxiety, depression, hypothyroidism, BPH who presents to ED for medication refill. Patient requesting Flomax  0.8mg  and Synthroid  25mcg. Patient has not had these medications for months. Patient needs these medications in order to be accepted into [facility].  Patient attempted to see PCP, but they are not able to see him for 2 weeks.  Patient has been clean from drugs for a couple of weeks. Family/friend at bedside stating that patient has been sleeping more often than usual, but patient denies any other complaints today. Patient stating that thyroid  levels were checked 03/2023, but I am not able to see these labs in the chart.   Denies fever, chest pain, dyspnea, cough, nausea, vomiting, diarrhea, dysuria, hematuria, hematochezia.   Medication Refill      Home Medications Prior to Admission medications   Medication Sig Start Date End Date Taking? Authorizing Provider  amantadine  (SYMMETREL ) 100 MG capsule Take 1 capsule (100 mg total) by mouth daily. Then d/c 08/11/21   Mayers, Cari S, PA-C  busPIRone  (BUSPAR ) 30 MG tablet Take 1 tablet (30 mg total) by mouth 2 (two) times daily. 08/11/21   Mayers, Cari S, PA-C  levothyroxine  (SYNTHROID ) 25 MCG tablet Take 1 tablet (25 mcg total) by mouth daily at 6 (six) AM for 30 doses. 12/31/23 01/30/24  [place] Bureau, PA-C  QUEtiapine  (SEROQUEL ) 100 MG tablet Take 1 tablet (100 mg total) by mouth at bedtime. 08/11/21   Mayers, Cari S, PA-C  tamsulosin  (FLOMAX ) 0.4 MG CAPS capsule Take 2 capsules (0.8 mg total) by mouth at bedtime for 30 doses. 12/31/23 01/30/24  [place] Bureau, PA-C  venlafaxine  XR (EFFEXOR  XR) 75 MG 24 hr capsule Take 1 capsule (75 mg  total) by mouth daily with breakfast. 08/11/21   Mayers, Cari S, PA-C  venlafaxine  XR (EFFEXOR -XR) 150 MG 24 hr capsule Take 1 capsule (150 mg total) by mouth daily with breakfast. 08/11/21   Mayers, Cari S, PA-C  clonazePAM  (KLONOPIN ) 0.5 MG tablet Take 1 tablet (0.5 mg total) by mouth 2 (two) times daily. Patient not taking: Reported on 02/07/2021 07/28/16 02/07/21  Benjamin Brands, [name]  pantoprazole  (PROTONIX ) 20 MG tablet Take 1 tablet (20 mg total) by mouth 2 (two) times daily. Patient not taking: No sig reported 11/11/20 02/07/21  Lind Repine, MD  sucralfate  (CARAFATE ) 1 g tablet Take 1 tablet (1 g total) by mouth 4 (four) times daily. Patient not taking: Reported on 02/07/2021 11/11/20 02/07/21  Lind Repine, MD  terazosin (HYTRIN) 1 MG capsule Take 1 mg by mouth at bedtime. Patient not taking: No sig reported  02/07/21  [provider]  traZODone  (DESYREL ) 50 MG tablet Take 1 tablet (50 mg total) by mouth at bedtime as needed for sleep. Patient not taking: No sig reported 02/15/21 04/03/21  Donnis Galeazzi, NP      Allergies    Patient has no known allergies.    Review of Systems   Review of Systems  Constitutional:        Medication refill    Physical Exam Updated Vital Signs BP 105/75 (BP Location: Right Arm)   Pulse 71   Temp 98 F (36.7 C) (Oral)   Resp 18  Ht 6\' 4"  (1.93 m)   Wt 97.5 kg   SpO2 100%   BMI 26.17 kg/m  Physical Exam Vitals and nursing note reviewed.  Constitutional:      General: He is not in acute distress.    Appearance: He is not ill-appearing or toxic-appearing.  HENT:     Head: Normocephalic and atraumatic.     Comments: No thyroid  tenderness to palpation or enlargement    Mouth/Throat:     Mouth: Mucous membranes are moist.  Eyes:     General: No scleral icterus.       Right eye: No discharge.        Left eye: No discharge.     Conjunctiva/sclera: Conjunctivae normal.  Cardiovascular:     Rate and Rhythm: Normal rate and regular  rhythm.     Pulses: Normal pulses.     Heart sounds: Normal heart sounds. No murmur heard. Pulmonary:     Effort: Pulmonary effort is normal. No respiratory distress.     Breath sounds: Normal breath sounds. No wheezing, rhonchi or rales.  Abdominal:     General: Abdomen is flat. Bowel sounds are normal. There is no distension.     Palpations: Abdomen is soft. There is no mass.     Tenderness: There is no abdominal tenderness.  Musculoskeletal:     Right lower leg: No edema.     Left lower leg: No edema.  Skin:    General: Skin is warm and dry.     Findings: No rash.  Neurological:     General: No focal deficit present.     Mental Status: He is alert and oriented to person, place, and time. Mental status is at baseline.  Psychiatric:        Mood and Affect: Mood normal.     ED Results / Procedures / Treatments   Labs (all labs ordered are listed, but only abnormal results are displayed) Labs Reviewed  TSH  CBC WITH DIFFERENTIAL/PLATELET  BASIC METABOLIC PANEL WITH GFR  T4, FREE    EKG None  Radiology No results found.  Procedures Procedures    Medications Ordered in ED Medications - No data to display  ED Course/ Medical Decision Making/ A&P                                 Medical Decision Making Amount and/or Complexity of Data Reviewed Labs: ordered.   This patient presents to the ED for concern of medication refill  Co morbidities that complicate the patient evaluation  polysubstance use disorder, anxiety, depression, hypothyroidism, BPH   Additional history obtained:  Additional history obtained from 07/2021 PCP note: patient with 0.8mg  flomax  for BPH and nocturia (reassuring PSA levels) and synthroid  for hypothyroidism.    Problem List / ED Course / Critical interventions / Medication management  Patient requesting medication refill for Synthroid  and Flomax . Patient needs these medications to be accepted into [facility].  Patient has not had these medications for months, so I will obtain labs before prescribing his medication. Patient has been sleeping a little more than usual, but does not have any infectious complaints today. Physical exam reassuring. Patient afebrile with stable vitals. I Ordered, and personally interpreted labs.  BMP unremarkable.  CBC without leukocytosis or anemia.  TSH within normal limits. Free T4 pending. I called lab who stated that this is a send-out lab and will not result for  many hours. Shared this with patient who is not interested in staying in the ED for this results especially because he is not even taking this medication and only needs to present it to [facility] to be admitted. Shared decision making with patient who agreed to follow up with Free T4 levels on MyChart before taking prescribed Synthroid . Patient understands to only take Synthroid  if T4 levels are low. Patient agrees to follow up with PCP for future medication refills. Patient currently without any alarming hypothyroidism symptoms at this time. I have reviewed the patients home medicines and have made adjustments as needed The patient has been appropriately medically screened and/or stabilized in the ED. I have low suspicion for any other emergent medical condition which would require further screening, evaluation or treatment in the ED or require inpatient management. At time of discharge the patient is hemodynamically stable and in no acute distress. I have discussed work-up results and diagnosis with patient and answered all questions. Patient is agreeable with discharge plan. We discussed strict return precautions for returning to the emergency department and they verbalized understanding.     Social Determinants of Health:  none         Final Clinical Impression(s) / ED Diagnoses Final diagnoses:  Medication refill  Hypothyroidism, unspecified type    Rx / DC Orders ED Discharge Orders          Ordered     levothyroxine  (SYNTHROID ) 25 MCG tablet  Daily        12/31/23 1323    tamsulosin  (FLOMAX ) 0.4 MG CAPS capsule  Daily at bedtime       Note to Pharmacy: Dose change   12/31/23 1323              [place] Bureau, [place] 12/31/23 1330    Burnette Carte, MD 12/31/23 1505

## 2023-12-31 NOTE — Discharge Instructions (Addendum)
 As discussed, do not take Synthroid  unless your T4 levels are low. Your TSH was normal today. Rest of lab workup was reassuring. Please follow up with your primary care provider. Seek emergency care if experiencing any new or worsening symptoms.

## 2023-12-31 NOTE — ED Triage Notes (Signed)
 To triage 5 ambulatory with family for medication refill of flomax  and synthroid  pills. Patient appears awake, alert and oriented. Breathing even and unlabored.

## 2024-02-13 ENCOUNTER — Encounter: Payer: Self-pay | Admitting: Physician Assistant

## 2024-02-13 ENCOUNTER — Ambulatory Visit: Payer: Self-pay | Admitting: Physician Assistant

## 2024-02-13 VITALS — BP 110/68 | HR 77 | Ht 76.0 in | Wt 232.0 lb

## 2024-02-13 DIAGNOSIS — F1010 Alcohol abuse, uncomplicated: Secondary | ICD-10-CM

## 2024-02-13 DIAGNOSIS — N401 Enlarged prostate with lower urinary tract symptoms: Secondary | ICD-10-CM

## 2024-02-13 DIAGNOSIS — R351 Nocturia: Secondary | ICD-10-CM

## 2024-02-13 DIAGNOSIS — E039 Hypothyroidism, unspecified: Secondary | ICD-10-CM

## 2024-02-13 DIAGNOSIS — F1420 Cocaine dependence, uncomplicated: Secondary | ICD-10-CM

## 2024-02-13 MED ORDER — TAMSULOSIN HCL 0.4 MG PO CAPS: 0.8000 mg | ORAL_CAPSULE | Freq: Every day | ORAL | 1 refills | Status: AC

## 2024-02-13 MED ORDER — LEVOTHYROXINE SODIUM 75 MCG PO TABS
75.0000 ug | ORAL_TABLET | Freq: Every day | ORAL | 1 refills | Status: AC
Start: 2024-02-13 — End: ?

## 2024-02-13 NOTE — Progress Notes (Signed)
 Established Patient Office Visit  Subjective   Patient ID: Christopher Giles, male    DOB: Oct 28, 1965  Age: 58 y.o. MRN: [id number]  Chief Complaint  Patient presents with   Medication Refill   Discussed the use of AI scribe software for clinical note transcription with the patient, who gave verbal consent to proceed.  History of Present Illness   Christopher Giles is a 58 year old male with depression who presents for medication management and follow-up.  He is transitioning from [facility] [place] to [facility] in [place].  He takes thyroid  medication and Flomax  for prostate issues. His thyroid  and kidney functions are normal, and there are no signs of anemia. He has a history of alcohol consumption.  No other concerns other than medication refills at this time.   Past Medical History:  Diagnosis Date   Anxiety    Depression    Moderate cocaine use disorder (HCC) 02/09/2021   Substance abuse (HCC)    Suicide attempt (HCC)    x 6 per pt   Thyroid  disease    hypo   Social History   Socioeconomic History   Marital status: Single    Spouse name: Not on file   Number of children: Not on file   Years of education: 12   Highest education level: High school graduate  Occupational History   Not on file  Tobacco Use   Smoking status: Former    Current packs/day: 1.00    Types: Cigarettes   Smokeless tobacco: Never  Substance and Sexual Activity   Alcohol use: Not Currently    Comment: Date of last use- 04.09.2025   Drug use: Not Currently    Types: Marijuana, Cocaine    Comment: Date of last use- 04.09.2025   Sexual activity: Never    Birth control/protection: Abstinence  Other Topics Concern   Not on file  Social History Narrative   Not on file   Social Drivers of Health   Financial Resource Strain: Not on file  Food Insecurity: Not on file  Transportation Needs: Not on file  Physical Activity: Not on file  Stress: Not on file  Social Connections:  Not on file  Intimate Partner Violence: Not on file   Family History  Problem Relation Age of Onset   Hyperlipidemia Mother    Cancer Mother    Cancer Father    Diabetes Maternal Grandmother    No Known Allergies  Review of Systems  Constitutional: Negative.   HENT: Negative.    Eyes: Negative.   Respiratory:  Negative for shortness of breath.   Cardiovascular:  Negative for chest pain.  Gastrointestinal: Negative.   Genitourinary: Negative.   Musculoskeletal: Negative.   Skin: Negative.   Neurological: Negative.   Endo/Heme/Allergies: Negative.   Psychiatric/Behavioral: Negative.        Objective:     BP 110/68 (BP Location: Left Arm, Patient Position: Sitting, Cuff Size: Large)   Pulse 77   Ht 6\' 4"  (1.93 m)   Wt 232 lb (105.2 kg)   SpO2 95%   BMI 28.24 kg/m  BP Readings from Last 3 Encounters:  02/13/24 110/68  12/31/23 105/75  08/11/21 109/71   Wt Readings from Last 3 Encounters:  02/13/24 232 lb (105.2 kg)  12/31/23 215 lb (97.5 kg)  08/11/21 202 lb (91.6 kg)    Physical Exam Vitals and nursing note reviewed.    GENERAL: Alert, cooperative, well developed, no acute distress HEENT: Normocephalic, normal oropharynx,  moist mucous membranes NECK: Neck normal CHEST: Clear to auscultation bilaterally, no wheezes, rhonchi, or crackles CARDIOVASCULAR: Normal heart rate and rhythm, S1 and S2 normal without murmurs ABDOMEN: Soft, non-tender, non-distended, without organomegaly, normal bowel sounds EXTREMITIES: No cyanosis or edema NEUROLOGICAL: Cranial nerves grossly intact, moves all extremities without gross motor or sensory deficit   Assessment & Plan:   Problem List Items Addressed This Visit       Endocrine   Hypothyroidism - Primary   Relevant Medications   levothyroxine  (SYNTHROID ) 75 MCG tablet     Other   Cocaine use disorder, severe, dependence (HCC)   Alcohol abuse   BPH associated with nocturia   Relevant Medications   tamsulosin   (FLOMAX ) 0.4 MG CAPS capsule   1. Hypothyroidism, unspecified type (Primary) Recent labs show thyroid  within normal limits, continue current regimen - levothyroxine  (SYNTHROID ) 75 MCG tablet; Take 1 tablet (75 mcg total) by mouth daily before breakfast.  Dispense: 30 tablet; Refill: 1  2. BPH associated with nocturia Continue current regimen - tamsulosin  (FLOMAX ) 0.4 MG CAPS capsule; Take 2 capsules (0.8 mg total) by mouth daily.  Dispense: 60 capsule; Refill: 1  3. Alcohol abuse Currently in substance abuse treatment program  4. Cocaine use disorder, severe, dependence (HCC)    I have reviewed the patient's medical history (PMH, PSH, Social History, Family History, Medications, and allergies) , and have been updated if relevant. I spent 30 minutes reviewing chart and  face to face time with patient.    Return if symptoms worsen or fail to improve.    Etter Hermann Mayers, PA-C

## 2024-02-13 NOTE — Patient Instructions (Addendum)
 VISIT SUMMARY:  Today, you came in for a follow-up appointment to manage your depression and other health concerns. We discussed your current medications and overall health status.  YOUR PLAN:   -HYPOTHYROIDISM: Hypothyroidism is a condition where the thyroid  gland does not produce enough thyroid  hormone. Your thyroid  function tests are normal, and you should continue taking your current thyroid  medication.  -BENIGN PROSTATIC HYPERPLASIA: Benign prostatic hyperplasia is an enlarged prostate gland that can cause urinary issues. Your condition is stable with Flomax , and we will refill your prescription.  -GENERAL HEALTH MAINTENANCE: For your general health, you are due for routine screenings. Your previous labs showed elevated triglycerides, which may be related to alcohol consumption. We will perform liver function tests, prostate cancer screening, and check your cholesterol levels at your next appointment.  Health Maintenance, Male Adopting a healthy lifestyle and getting preventive care are important in promoting health and wellness. Ask your health care provider about: The right schedule for you to have regular tests and exams. Things you can do on your own to prevent diseases and keep yourself healthy. What should I know about diet, weight, and exercise? Eat a healthy diet  Eat a diet that includes plenty of vegetables, fruits, low-fat dairy products, and lean protein. Do not eat a lot of foods that are high in solid fats, added sugars, or sodium. Maintain a healthy weight Body mass index (BMI) is a measurement that can be used to identify possible weight problems. It estimates body fat based on height and weight. Your health care provider can help determine your BMI and help you achieve or maintain a healthy weight. Get regular exercise Get regular exercise. This is one of the most important things you can do for your health. Most adults should: Exercise for at least 150 minutes each  week. The exercise should increase your heart rate and make you sweat (moderate-intensity exercise). Do strengthening exercises at least twice a week. This is in addition to the moderate-intensity exercise. Spend less time sitting. Even light physical activity can be beneficial. Watch cholesterol and blood lipids Have your blood tested for lipids and cholesterol at 57 years of age, then have this test every 5 years. You may need to have your cholesterol levels checked more often if: Your lipid or cholesterol levels are high. You are older than 57 years of age. You are at high risk for heart disease. What should I know about cancer screening? Many types of cancers can be detected early and may often be prevented. Depending on your health history and family history, you may need to have cancer screening at various ages. This may include screening for: Colorectal cancer. Prostate cancer. Skin cancer. Lung cancer. What should I know about heart disease, diabetes, and high blood pressure? Blood pressure and heart disease High blood pressure causes heart disease and increases the risk of stroke. This is more likely to develop in people who have high blood pressure readings or are overweight. Talk with your health care provider about your target blood pressure readings. Have your blood pressure checked: Every 3-5 years if you are 38-108 years of age. Every year if you are 65 years old or older. If you are between the ages of 65 and 44 and are a current or former smoker, ask your health care provider if you should have a one-time screening for abdominal aortic aneurysm (AAA). Diabetes Have regular diabetes screenings. This checks your fasting blood sugar level. Have the screening done: Once every three years  after age 41 if you are at a normal weight and have a low risk for diabetes. More often and at a younger age if you are overweight or have a high risk for diabetes. What should I know about  preventing infection? Hepatitis B If you have a higher risk for hepatitis B, you should be screened for this virus. Talk with your health care provider to find out if you are at risk for hepatitis B infection. Hepatitis C Blood testing is recommended for: Everyone born from [birth year] through 1964. Anyone with known risk factors for hepatitis C. Sexually transmitted infections (STIs) You should be screened each year for STIs, including gonorrhea and chlamydia, if: You are sexually active and are younger than 57 years of age. You are older than 57 years of age and your health care provider tells you that you are at risk for this type of infection. Your sexual activity has changed since you were last screened, and you are at increased risk for chlamydia or gonorrhea. Ask your health care provider if you are at risk. Ask your health care provider about whether you are at high risk for HIV. Your health care provider may recommend a prescription medicine to help prevent HIV infection. If you choose to take medicine to prevent HIV, you should first get tested for HIV. You should then be tested every 3 months for as long as you are taking the medicine. Follow these instructions at home: Alcohol use Do not drink alcohol if your health care provider tells you not to drink. If you drink alcohol: Limit how much you have to 0-2 drinks a day. Know how much alcohol is in your drink. In the [place] one drink equals one 12 oz bottle of beer (355 mL), one 5 oz glass of wine (148 mL), or one 1 oz glass of hard liquor (44 mL). Lifestyle Do not use any products that contain nicotine  or tobacco. These products include cigarettes, chewing tobacco, and vaping devices, such as e-cigarettes. If you need help quitting, ask your health care provider. Do not use street drugs. Do not share needles. Ask your health care provider for help if you need support or information about quitting drugs. General instructions Schedule  regular health, dental, and eye exams. Stay current with your vaccines. Tell your health care provider if: You often feel depressed. You have ever been abused or do not feel safe at home. Summary Adopting a healthy lifestyle and getting preventive care are important in promoting health and wellness. Follow your health care provider's instructions about healthy diet, exercising, and getting tested or screened for diseases. Follow your health care provider's instructions on monitoring your cholesterol and blood pressure. This information is not intended to replace advice given to you by your health care provider. Make sure you discuss any questions you have with your health care provider. Document Revised: 01/19/2021 Document Reviewed: 01/19/2021 [employer]vier Patient Education  2024 [employer].
